# Patient Record
Sex: Female | Born: 1950 | Race: Black or African American | Hispanic: No | Marital: Married | Smoking: Never smoker
Health system: Southern US, Community
[De-identification: ages and names within clinical notes are randomized; demographics above are authoritative.]

## PROBLEM LIST (undated history)

## (undated) DIAGNOSIS — E785 Hyperlipidemia, unspecified: Secondary | ICD-10-CM

## (undated) DIAGNOSIS — M1990 Unspecified osteoarthritis, unspecified site: Secondary | ICD-10-CM

## (undated) DIAGNOSIS — I779 Disorder of arteries and arterioles, unspecified: Secondary | ICD-10-CM

## (undated) DIAGNOSIS — E669 Obesity, unspecified: Secondary | ICD-10-CM

## (undated) DIAGNOSIS — I10 Essential (primary) hypertension: Secondary | ICD-10-CM

## (undated) DIAGNOSIS — I4819 Other persistent atrial fibrillation: Secondary | ICD-10-CM

## (undated) DIAGNOSIS — I480 Paroxysmal atrial fibrillation: Secondary | ICD-10-CM

## (undated) DIAGNOSIS — I517 Cardiomegaly: Secondary | ICD-10-CM

## (undated) DIAGNOSIS — I4510 Unspecified right bundle-branch block: Secondary | ICD-10-CM

## (undated) DIAGNOSIS — I700 Atherosclerosis of aorta: Secondary | ICD-10-CM

## (undated) HISTORY — DX: Unspecified right bundle-branch block: I45.10

## (undated) HISTORY — PX: BREAST BIOPSY: SHX20

## (undated) HISTORY — DX: Other persistent atrial fibrillation: I48.19

## (undated) HISTORY — PX: ABDOMINAL HYSTERECTOMY: SHX81

## (undated) HISTORY — DX: Obesity, unspecified: E66.9

## (undated) HISTORY — DX: Essential (primary) hypertension: I10

## (undated) HISTORY — DX: Atherosclerosis of aorta: I70.0

## (undated) HISTORY — DX: Hyperlipidemia, unspecified: E78.5

## (undated) HISTORY — DX: Paroxysmal atrial fibrillation: I48.0

## (undated) HISTORY — DX: Disorder of arteries and arterioles, unspecified: I77.9

## (undated) HISTORY — PX: TOTAL ABDOMINAL HYSTERECTOMY W/ BILATERAL SALPINGOOPHORECTOMY: SHX83

---

## 2001-03-03 ENCOUNTER — Encounter: Payer: Self-pay | Admitting: Specialist

## 2001-03-03 ENCOUNTER — Ambulatory Visit (HOSPITAL_COMMUNITY): Admission: RE | Admit: 2001-03-03 | Discharge: 2001-03-03 | Payer: Self-pay | Admitting: Specialist

## 2001-11-11 ENCOUNTER — Emergency Department (HOSPITAL_COMMUNITY): Admission: EM | Admit: 2001-11-11 | Discharge: 2001-11-11 | Payer: Self-pay | Admitting: Emergency Medicine

## 2001-11-16 ENCOUNTER — Encounter (HOSPITAL_COMMUNITY): Admission: RE | Admit: 2001-11-16 | Discharge: 2001-12-16 | Payer: Self-pay | Admitting: Internal Medicine

## 2002-04-01 ENCOUNTER — Ambulatory Visit (HOSPITAL_COMMUNITY): Admission: RE | Admit: 2002-04-01 | Discharge: 2002-04-01 | Payer: Self-pay | Admitting: Specialist

## 2002-04-01 ENCOUNTER — Encounter: Payer: Self-pay | Admitting: Specialist

## 2002-11-09 ENCOUNTER — Encounter: Payer: Self-pay | Admitting: Emergency Medicine

## 2002-11-09 ENCOUNTER — Emergency Department (HOSPITAL_COMMUNITY): Admission: EM | Admit: 2002-11-09 | Discharge: 2002-11-09 | Payer: Self-pay | Admitting: Emergency Medicine

## 2003-04-25 ENCOUNTER — Emergency Department (HOSPITAL_COMMUNITY): Admission: EM | Admit: 2003-04-25 | Discharge: 2003-04-25 | Payer: Self-pay | Admitting: Internal Medicine

## 2003-05-10 ENCOUNTER — Emergency Department (HOSPITAL_COMMUNITY): Admission: EM | Admit: 2003-05-10 | Discharge: 2003-05-10 | Payer: Self-pay | Admitting: Emergency Medicine

## 2003-05-16 ENCOUNTER — Ambulatory Visit (HOSPITAL_COMMUNITY): Admission: RE | Admit: 2003-05-16 | Discharge: 2003-05-16 | Payer: Self-pay | Admitting: *Deleted

## 2003-05-23 ENCOUNTER — Ambulatory Visit (HOSPITAL_COMMUNITY): Admission: RE | Admit: 2003-05-23 | Discharge: 2003-05-23 | Payer: Self-pay | Admitting: *Deleted

## 2003-06-07 ENCOUNTER — Inpatient Hospital Stay (HOSPITAL_COMMUNITY): Admission: AD | Admit: 2003-06-07 | Discharge: 2003-06-15 | Payer: Self-pay | Admitting: Internal Medicine

## 2003-06-07 ENCOUNTER — Ambulatory Visit (HOSPITAL_COMMUNITY): Admission: RE | Admit: 2003-06-07 | Discharge: 2003-06-07 | Payer: Self-pay | Admitting: *Deleted

## 2003-09-26 ENCOUNTER — Ambulatory Visit (HOSPITAL_COMMUNITY): Admission: RE | Admit: 2003-09-26 | Discharge: 2003-09-26 | Payer: Self-pay | Admitting: Family Medicine

## 2003-10-11 ENCOUNTER — Observation Stay (HOSPITAL_COMMUNITY): Admission: EM | Admit: 2003-10-11 | Discharge: 2003-10-12 | Payer: Self-pay | Admitting: Emergency Medicine

## 2004-01-12 ENCOUNTER — Inpatient Hospital Stay (HOSPITAL_COMMUNITY): Admission: AC | Admit: 2004-01-12 | Discharge: 2004-01-18 | Payer: Self-pay

## 2004-03-09 ENCOUNTER — Encounter: Admission: RE | Admit: 2004-03-09 | Discharge: 2004-03-09 | Payer: Self-pay | Admitting: Orthopaedic Surgery

## 2004-04-14 ENCOUNTER — Emergency Department (HOSPITAL_COMMUNITY): Admission: EM | Admit: 2004-04-14 | Discharge: 2004-04-14 | Payer: Self-pay | Admitting: Emergency Medicine

## 2004-04-24 ENCOUNTER — Emergency Department (HOSPITAL_COMMUNITY): Admission: EM | Admit: 2004-04-24 | Discharge: 2004-04-24 | Payer: Self-pay | Admitting: Emergency Medicine

## 2004-04-25 ENCOUNTER — Observation Stay (HOSPITAL_COMMUNITY): Admission: EM | Admit: 2004-04-25 | Discharge: 2004-04-26 | Payer: Self-pay | Admitting: Emergency Medicine

## 2004-04-25 ENCOUNTER — Ambulatory Visit: Payer: Self-pay | Admitting: Family Medicine

## 2004-04-25 ENCOUNTER — Ambulatory Visit: Payer: Self-pay | Admitting: Cardiology

## 2004-05-14 ENCOUNTER — Ambulatory Visit: Payer: Self-pay | Admitting: Family Medicine

## 2004-06-12 ENCOUNTER — Ambulatory Visit: Payer: Self-pay | Admitting: Orthopedic Surgery

## 2004-06-14 ENCOUNTER — Ambulatory Visit: Payer: Self-pay | Admitting: Family Medicine

## 2004-06-16 HISTORY — PX: SHOULDER SURGERY: SHX246

## 2004-06-16 HISTORY — PX: COLONOSCOPY: SHX174

## 2004-06-18 ENCOUNTER — Encounter (HOSPITAL_COMMUNITY): Admission: RE | Admit: 2004-06-18 | Discharge: 2004-07-18 | Payer: Self-pay | Admitting: Orthopedic Surgery

## 2004-06-19 ENCOUNTER — Ambulatory Visit (HOSPITAL_COMMUNITY): Admission: RE | Admit: 2004-06-19 | Discharge: 2004-06-19 | Payer: Self-pay | Admitting: Orthopedic Surgery

## 2004-06-26 ENCOUNTER — Ambulatory Visit: Payer: Self-pay | Admitting: Orthopedic Surgery

## 2004-07-22 ENCOUNTER — Encounter (HOSPITAL_COMMUNITY): Admission: RE | Admit: 2004-07-22 | Discharge: 2004-08-21 | Payer: Self-pay | Admitting: Orthopedic Surgery

## 2004-07-29 ENCOUNTER — Ambulatory Visit: Payer: Self-pay | Admitting: Orthopedic Surgery

## 2004-08-06 ENCOUNTER — Ambulatory Visit (HOSPITAL_COMMUNITY): Admission: RE | Admit: 2004-08-06 | Discharge: 2004-08-06 | Payer: Self-pay | Admitting: Orthopedic Surgery

## 2004-08-06 ENCOUNTER — Ambulatory Visit: Payer: Self-pay | Admitting: Orthopedic Surgery

## 2004-08-08 ENCOUNTER — Ambulatory Visit: Payer: Self-pay | Admitting: Orthopedic Surgery

## 2004-08-12 ENCOUNTER — Encounter (HOSPITAL_COMMUNITY): Admission: RE | Admit: 2004-08-12 | Discharge: 2004-09-11 | Payer: Self-pay | Admitting: Orthopedic Surgery

## 2004-08-15 ENCOUNTER — Ambulatory Visit: Payer: Self-pay | Admitting: Orthopedic Surgery

## 2004-09-12 ENCOUNTER — Ambulatory Visit: Payer: Self-pay | Admitting: Orthopedic Surgery

## 2004-09-17 ENCOUNTER — Encounter (HOSPITAL_COMMUNITY): Admission: RE | Admit: 2004-09-17 | Discharge: 2004-10-17 | Payer: Self-pay | Admitting: Orthopedic Surgery

## 2004-10-11 ENCOUNTER — Ambulatory Visit: Payer: Self-pay | Admitting: Family Medicine

## 2004-10-14 ENCOUNTER — Ambulatory Visit (HOSPITAL_COMMUNITY): Admission: RE | Admit: 2004-10-14 | Discharge: 2004-10-14 | Payer: Self-pay | Admitting: Family Medicine

## 2004-10-31 ENCOUNTER — Ambulatory Visit: Payer: Self-pay | Admitting: Orthopedic Surgery

## 2004-12-02 ENCOUNTER — Ambulatory Visit: Payer: Self-pay | Admitting: Orthopedic Surgery

## 2005-02-18 ENCOUNTER — Ambulatory Visit: Payer: Self-pay | Admitting: Family Medicine

## 2005-03-06 ENCOUNTER — Ambulatory Visit (HOSPITAL_COMMUNITY): Admission: RE | Admit: 2005-03-06 | Discharge: 2005-03-06 | Payer: Self-pay | Admitting: General Surgery

## 2005-03-06 LAB — HM COLONOSCOPY: HM Colonoscopy: NORMAL

## 2005-04-22 ENCOUNTER — Ambulatory Visit: Payer: Self-pay | Admitting: Family Medicine

## 2005-05-22 ENCOUNTER — Ambulatory Visit: Payer: Self-pay | Admitting: Family Medicine

## 2005-07-02 ENCOUNTER — Emergency Department (HOSPITAL_COMMUNITY): Admission: EM | Admit: 2005-07-02 | Discharge: 2005-07-02 | Payer: Self-pay | Admitting: Emergency Medicine

## 2005-07-03 ENCOUNTER — Ambulatory Visit: Payer: Self-pay | Admitting: Family Medicine

## 2005-09-18 ENCOUNTER — Ambulatory Visit: Payer: Self-pay | Admitting: Family Medicine

## 2005-10-15 ENCOUNTER — Ambulatory Visit (HOSPITAL_COMMUNITY): Admission: RE | Admit: 2005-10-15 | Discharge: 2005-10-15 | Payer: Self-pay | Admitting: Family Medicine

## 2006-01-28 ENCOUNTER — Ambulatory Visit: Payer: Self-pay | Admitting: Family Medicine

## 2006-04-30 ENCOUNTER — Other Ambulatory Visit: Admission: RE | Admit: 2006-04-30 | Discharge: 2006-04-30 | Payer: Self-pay | Admitting: Family Medicine

## 2006-04-30 ENCOUNTER — Ambulatory Visit: Payer: Self-pay | Admitting: Family Medicine

## 2006-06-30 ENCOUNTER — Ambulatory Visit (HOSPITAL_COMMUNITY): Admission: RE | Admit: 2006-06-30 | Discharge: 2006-06-30 | Payer: Self-pay | Admitting: Ophthalmology

## 2006-09-14 ENCOUNTER — Encounter: Payer: Self-pay | Admitting: Family Medicine

## 2006-09-14 LAB — CONVERTED CEMR LAB
Alkaline Phosphatase: 123 units/L — ABNORMAL HIGH (ref 39–117)
Basophils Relative: 1 % (ref 0–1)
Bilirubin, Direct: 0.1 mg/dL (ref 0.0–0.3)
Eosinophils Relative: 2 % (ref 0–5)
HDL: 40 mg/dL (ref >39)
Indirect Bilirubin: 0.3 mg/dL (ref 0.0–0.9)
Lymphocytes Relative: 40 % (ref 12–46)
MCV: 86.1 fL (ref 78.0–100.0)
Platelets: 238 10*3/uL (ref 150–400)
RBC: 4.45 M/uL (ref 3.87–5.11)
RDW: 14.7 % — ABNORMAL HIGH (ref 11.5–14.0)
Total Bilirubin: 0.4 mg/dL (ref 0.3–1.2)
Total CHOL/HDL Ratio: 3.5
Total Protein: 7.7 g/dL (ref 6.0–8.3)
Triglycerides: 60 mg/dL (ref <150)
VLDL: 12 mg/dL (ref 0–40)
WBC: 4.3 10*3/uL (ref 4.0–10.5)

## 2006-09-17 ENCOUNTER — Ambulatory Visit: Payer: Self-pay | Admitting: Family Medicine

## 2006-09-18 ENCOUNTER — Encounter: Payer: Self-pay | Admitting: Family Medicine

## 2006-09-18 LAB — CONVERTED CEMR LAB
Calcium: 9.3 mg/dL (ref 8.4–10.5)
Glucose, Bld: 90 mg/dL (ref 70–99)
Sodium: 140 meq/L (ref 135–145)

## 2006-09-25 ENCOUNTER — Ambulatory Visit: Payer: Self-pay | Admitting: Cardiology

## 2006-10-07 ENCOUNTER — Ambulatory Visit: Payer: Self-pay | Admitting: Family Medicine

## 2006-10-13 ENCOUNTER — Ambulatory Visit (HOSPITAL_COMMUNITY): Admission: RE | Admit: 2006-10-13 | Discharge: 2006-10-13 | Payer: Self-pay | Admitting: Family Medicine

## 2006-10-28 ENCOUNTER — Ambulatory Visit (HOSPITAL_COMMUNITY): Admission: RE | Admit: 2006-10-28 | Discharge: 2006-10-28 | Payer: Self-pay | Admitting: Cardiology

## 2006-10-28 ENCOUNTER — Ambulatory Visit: Payer: Self-pay | Admitting: Cardiology

## 2006-11-24 ENCOUNTER — Ambulatory Visit: Payer: Self-pay | Admitting: Cardiology

## 2007-01-05 ENCOUNTER — Ambulatory Visit: Payer: Self-pay | Admitting: Family Medicine

## 2007-01-07 ENCOUNTER — Encounter: Payer: Self-pay | Admitting: Family Medicine

## 2007-01-07 LAB — CONVERTED CEMR LAB
ALT: 25 units/L (ref 0–35)
AST: 18 units/L (ref 0–37)
BUN: 17 mg/dL (ref 6–23)
CO2: 26 meq/L (ref 19–32)
Cholesterol: 150 mg/dL (ref 0–200)
Creatinine, Ser: 0.6 mg/dL (ref 0.40–1.20)
Eosinophils Relative: 3 % (ref 0–5)
Glucose, Bld: 105 mg/dL — ABNORMAL HIGH (ref 70–99)
HCT: 37.8 % (ref 36.0–46.0)
HDL: 45 mg/dL (ref >39)
Hemoglobin: 12.3 g/dL (ref 12.0–15.0)
MCHC: 32.5 g/dL (ref 30.0–36.0)
MCV: 87.7 fL (ref 78.0–100.0)
Monocytes Relative: 7 % (ref 3–11)
Neutro Abs: 2.5 10*3/uL (ref 1.7–7.7)
Neutrophils Relative %: 49 % (ref 43–77)
Platelets: 222 10*3/uL (ref 150–400)
RBC: 4.31 M/uL (ref 3.87–5.11)
Total Bilirubin: 0.3 mg/dL (ref 0.3–1.2)
Total CHOL/HDL Ratio: 3.3
Triglycerides: 59 mg/dL (ref <150)
VLDL: 12 mg/dL (ref 0–40)

## 2007-01-11 ENCOUNTER — Ambulatory Visit (HOSPITAL_COMMUNITY): Admission: RE | Admit: 2007-01-11 | Discharge: 2007-01-11 | Payer: Self-pay | Admitting: Family Medicine

## 2007-03-23 ENCOUNTER — Ambulatory Visit: Payer: Self-pay | Admitting: Family Medicine

## 2007-05-03 ENCOUNTER — Other Ambulatory Visit: Admission: RE | Admit: 2007-05-03 | Discharge: 2007-05-03 | Payer: Self-pay | Admitting: Family Medicine

## 2007-05-03 ENCOUNTER — Ambulatory Visit: Payer: Self-pay | Admitting: Family Medicine

## 2007-05-03 LAB — CONVERTED CEMR LAB
ALT: 21 units/L (ref 0–35)
Albumin: 4.1 g/dL (ref 3.5–5.2)
Basophils Absolute: 0 10*3/uL (ref 0.0–0.1)
Bilirubin, Direct: 0.1 mg/dL (ref 0.0–0.3)
CO2: 24 meq/L (ref 19–32)
Chloride: 104 meq/L (ref 96–112)
Creatinine, Ser: 0.63 mg/dL (ref 0.40–1.20)
Eosinophils Absolute: 0.1 10*3/uL — ABNORMAL LOW (ref 0.2–0.7)
Eosinophils Relative: 3 % (ref 0–5)
HDL: 41 mg/dL (ref >39)
Hemoglobin: 13.1 g/dL (ref 12.0–15.0)
Indirect Bilirubin: 0.2 mg/dL (ref 0.0–0.9)
LDL Cholesterol: 89 mg/dL (ref 0–99)
Lymphocytes Relative: 34 % (ref 12–46)
Lymphs Abs: 1.8 10*3/uL (ref 0.7–4.0)
MCV: 88.4 fL (ref 78.0–100.0)
Monocytes Absolute: 0.4 10*3/uL (ref 0.1–1.0)
Monocytes Relative: 7 % (ref 3–12)
Neutrophils Relative %: 56 % (ref 43–77)
Platelets: 221 10*3/uL (ref 150–400)
Potassium: 4 meq/L (ref 3.5–5.3)
Sodium: 139 meq/L (ref 135–145)
Total Bilirubin: 0.3 mg/dL (ref 0.3–1.2)
VLDL: 14 mg/dL (ref 0–40)

## 2007-06-17 ENCOUNTER — Encounter: Payer: Self-pay | Admitting: Family Medicine

## 2007-09-08 ENCOUNTER — Ambulatory Visit: Payer: Self-pay | Admitting: Family Medicine

## 2007-09-08 ENCOUNTER — Ambulatory Visit (HOSPITAL_COMMUNITY): Admission: RE | Admit: 2007-09-08 | Discharge: 2007-09-08 | Payer: Self-pay | Admitting: Family Medicine

## 2007-09-08 LAB — CONVERTED CEMR LAB
ALT: 21 units/L (ref 0–35)
AST: 18 units/L (ref 0–37)
Alkaline Phosphatase: 117 units/L (ref 39–117)
CO2: 25 meq/L (ref 19–32)
Chloride: 105 meq/L (ref 96–112)
Cholesterol: 141 mg/dL (ref 0–200)
Eosinophils Absolute: 0.2 10*3/uL (ref 0.0–0.7)
HDL: 43 mg/dL (ref >39)
Hemoglobin: 12.9 g/dL (ref 12.0–15.0)
Indirect Bilirubin: 0.2 mg/dL (ref 0.0–0.9)
LDL Cholesterol: 86 mg/dL (ref 0–99)
Lymphocytes Relative: 31 % (ref 12–46)
Lymphs Abs: 1.7 10*3/uL (ref 0.7–4.0)
MCHC: 34 g/dL (ref 30.0–36.0)
MCV: 86.1 fL (ref 78.0–100.0)
Monocytes Absolute: 0.3 10*3/uL (ref 0.1–1.0)
Monocytes Relative: 6 % (ref 3–12)
Neutrophils Relative %: 59 % (ref 43–77)
Potassium: 4.7 meq/L (ref 3.5–5.3)
Total Bilirubin: 0.3 mg/dL (ref 0.3–1.2)
Total CHOL/HDL Ratio: 3.3
Triglycerides: 62 mg/dL (ref <150)
Troponin I: 0.04 ng/mL (ref <0.06)

## 2007-12-06 ENCOUNTER — Encounter: Payer: Self-pay | Admitting: Family Medicine

## 2007-12-06 DIAGNOSIS — E785 Hyperlipidemia, unspecified: Secondary | ICD-10-CM | POA: Insufficient documentation

## 2008-02-09 ENCOUNTER — Encounter: Payer: Self-pay | Admitting: Family Medicine

## 2008-12-08 ENCOUNTER — Ambulatory Visit: Payer: Self-pay | Admitting: Family Medicine

## 2008-12-08 DIAGNOSIS — I10 Essential (primary) hypertension: Secondary | ICD-10-CM | POA: Insufficient documentation

## 2008-12-08 DIAGNOSIS — H547 Unspecified visual loss: Secondary | ICD-10-CM

## 2008-12-08 HISTORY — DX: Unspecified visual loss: H54.7

## 2008-12-08 LAB — CONVERTED CEMR LAB
Albumin: 4.6 g/dL (ref 3.5–5.2)
BUN: 18 mg/dL (ref 6–23)
Basophils Absolute: 0 10*3/uL (ref 0.0–0.1)
Basophils Relative: 1 % (ref 0–1)
CO2: 26 meq/L (ref 19–32)
Calcium: 9.4 mg/dL (ref 8.4–10.5)
Creatinine, Ser: 0.61 mg/dL (ref 0.40–1.20)
Glucose, Bld: 106 mg/dL — ABNORMAL HIGH (ref 70–99)
HDL: 45 mg/dL (ref >39)
LDL Cholesterol: 92 mg/dL (ref 0–99)
Lymphocytes Relative: 37 % (ref 12–46)
Lymphs Abs: 2 10*3/uL (ref 0.7–4.0)
MCHC: 32.1 g/dL (ref 30.0–36.0)
Monocytes Absolute: 0.4 10*3/uL (ref 0.1–1.0)
Neutro Abs: 2.8 10*3/uL (ref 1.7–7.7)
Neutrophils Relative %: 51 % (ref 43–77)
RDW: 15.7 % — ABNORMAL HIGH (ref 11.5–15.5)
Sodium: 140 meq/L (ref 135–145)
Total Bilirubin: 0.4 mg/dL (ref 0.3–1.2)
Total Protein: 7.8 g/dL (ref 6.0–8.3)
Triglycerides: 77 mg/dL (ref <150)

## 2009-01-03 ENCOUNTER — Ambulatory Visit: Payer: Self-pay | Admitting: Family Medicine

## 2009-01-03 DIAGNOSIS — M25569 Pain in unspecified knee: Secondary | ICD-10-CM | POA: Insufficient documentation

## 2009-02-07 ENCOUNTER — Telehealth: Payer: Self-pay | Admitting: Family Medicine

## 2009-02-09 ENCOUNTER — Ambulatory Visit: Payer: Self-pay | Admitting: Family Medicine

## 2009-02-09 ENCOUNTER — Encounter: Payer: Self-pay | Admitting: Family Medicine

## 2009-02-09 ENCOUNTER — Other Ambulatory Visit: Admission: RE | Admit: 2009-02-09 | Discharge: 2009-02-09 | Payer: Self-pay | Admitting: Family Medicine

## 2009-02-16 ENCOUNTER — Ambulatory Visit (HOSPITAL_COMMUNITY): Admission: RE | Admit: 2009-02-16 | Discharge: 2009-02-16 | Payer: Self-pay | Admitting: Family Medicine

## 2009-03-23 ENCOUNTER — Encounter: Payer: Self-pay | Admitting: Family Medicine

## 2009-03-26 LAB — CONVERTED CEMR LAB
BUN: 16 mg/dL (ref 6–23)
Bilirubin, Direct: 0.1 mg/dL (ref 0.0–0.3)
Calcium: 9.5 mg/dL (ref 8.4–10.5)
Cholesterol: 147 mg/dL (ref 0–200)
Glucose, Bld: 110 mg/dL — ABNORMAL HIGH (ref 70–99)
HDL: 38 mg/dL — ABNORMAL LOW (ref >39)
LDL Cholesterol: 89 mg/dL (ref 0–99)
Sodium: 141 meq/L (ref 135–145)
Triglycerides: 101 mg/dL (ref <150)

## 2009-05-18 ENCOUNTER — Ambulatory Visit: Payer: Self-pay | Admitting: Family Medicine

## 2009-05-22 ENCOUNTER — Telehealth: Payer: Self-pay | Admitting: Family Medicine

## 2009-06-22 LAB — CONVERTED CEMR LAB
AST: 17 units/L (ref 0–37)
Albumin: 4 g/dL (ref 3.5–5.2)
BUN: 17 mg/dL (ref 6–23)
CO2: 27 meq/L (ref 19–32)
Cholesterol: 167 mg/dL (ref 0–200)
Glucose, Bld: 92 mg/dL (ref 70–99)
HDL: 42 mg/dL (ref >39)
LDL Cholesterol: 103 mg/dL — ABNORMAL HIGH (ref 0–99)
Total Bilirubin: 0.3 mg/dL (ref 0.3–1.2)
Total CHOL/HDL Ratio: 4
Total Protein: 7.4 g/dL (ref 6.0–8.3)
Triglycerides: 110 mg/dL (ref <150)
VLDL: 22 mg/dL (ref 0–40)

## 2009-08-31 ENCOUNTER — Ambulatory Visit: Payer: Self-pay | Admitting: Family Medicine

## 2009-10-25 ENCOUNTER — Encounter: Payer: Self-pay | Admitting: Physician Assistant

## 2009-10-25 ENCOUNTER — Telehealth: Payer: Self-pay | Admitting: Family Medicine

## 2009-10-25 LAB — CONVERTED CEMR LAB
Albumin: 4.1 g/dL (ref 3.5–5.2)
BUN: 22 mg/dL (ref 6–23)
Calcium: 8.9 mg/dL (ref 8.4–10.5)
Chloride: 100 meq/L (ref 96–112)
Creatinine, Ser: 0.78 mg/dL (ref 0.40–1.20)
HDL: 39 mg/dL — ABNORMAL LOW (ref >39)
Hemoglobin: 12.3 g/dL (ref 12.0–15.0)
Potassium: 4.1 meq/L (ref 3.5–5.3)
RBC: 4.33 M/uL (ref 3.87–5.11)
Sodium: 136 meq/L (ref 135–145)
Total CHOL/HDL Ratio: 4.1
Total Protein: 7.2 g/dL (ref 6.0–8.3)
Triglycerides: 113 mg/dL (ref <150)
WBC: 5.8 10*3/uL (ref 4.0–10.5)

## 2009-10-31 ENCOUNTER — Ambulatory Visit: Payer: Self-pay | Admitting: Family Medicine

## 2009-11-16 ENCOUNTER — Telehealth: Payer: Self-pay | Admitting: Family Medicine

## 2009-11-30 LAB — CONVERTED CEMR LAB
ALT: 16 units/L (ref 0–35)
AST: 16 units/L (ref 0–37)
Albumin: 4.1 g/dL (ref 3.5–5.2)
BUN: 14 mg/dL (ref 6–23)
Calcium: 9.4 mg/dL (ref 8.4–10.5)
Chloride: 103 meq/L (ref 96–112)
Cholesterol: 175 mg/dL (ref 0–200)
LDL Cholesterol: 116 mg/dL — ABNORMAL HIGH (ref 0–99)
Microalb Creat Ratio: 5 mg/g (ref 0.0–30.0)
Microalb, Ur: 1.08 mg/dL (ref 0.00–1.89)
Potassium: 3.9 meq/L (ref 3.5–5.3)
Total Protein: 7.4 g/dL (ref 6.0–8.3)
Triglycerides: 106 mg/dL (ref <150)
VLDL: 21 mg/dL (ref 0–40)

## 2009-12-03 ENCOUNTER — Telehealth: Payer: Self-pay | Admitting: Family Medicine

## 2010-01-31 ENCOUNTER — Telehealth: Payer: Self-pay | Admitting: Family Medicine

## 2010-01-31 ENCOUNTER — Ambulatory Visit: Payer: Self-pay | Admitting: Family Medicine

## 2010-01-31 DIAGNOSIS — H109 Unspecified conjunctivitis: Secondary | ICD-10-CM | POA: Insufficient documentation

## 2010-02-08 ENCOUNTER — Encounter: Payer: Self-pay | Admitting: Family Medicine

## 2010-03-16 ENCOUNTER — Emergency Department (HOSPITAL_COMMUNITY): Admission: EM | Admit: 2010-03-16 | Discharge: 2010-03-16 | Payer: Self-pay | Admitting: Emergency Medicine

## 2010-03-20 ENCOUNTER — Telehealth: Payer: Self-pay | Admitting: Family Medicine

## 2010-03-20 ENCOUNTER — Encounter: Payer: Self-pay | Admitting: Physician Assistant

## 2010-03-20 ENCOUNTER — Ambulatory Visit: Payer: Self-pay | Admitting: Family Medicine

## 2010-03-20 DIAGNOSIS — R42 Dizziness and giddiness: Secondary | ICD-10-CM | POA: Insufficient documentation

## 2010-03-20 DIAGNOSIS — R51 Headache: Secondary | ICD-10-CM | POA: Insufficient documentation

## 2010-03-20 DIAGNOSIS — R519 Headache, unspecified: Secondary | ICD-10-CM | POA: Insufficient documentation

## 2010-03-22 ENCOUNTER — Ambulatory Visit (HOSPITAL_COMMUNITY)
Admission: RE | Admit: 2010-03-22 | Discharge: 2010-03-22 | Payer: Self-pay | Source: Home / Self Care | Admitting: Family Medicine

## 2010-03-25 ENCOUNTER — Telehealth: Payer: Self-pay | Admitting: Family Medicine

## 2010-03-26 ENCOUNTER — Encounter (INDEPENDENT_AMBULATORY_CARE_PROVIDER_SITE_OTHER): Payer: Self-pay

## 2010-03-28 ENCOUNTER — Ambulatory Visit: Payer: Self-pay | Admitting: Otolaryngology

## 2010-04-01 ENCOUNTER — Encounter: Payer: Self-pay | Admitting: Family Medicine

## 2010-04-05 LAB — CONVERTED CEMR LAB
ALT: 17 units/L (ref 0–35)
AST: 18 units/L (ref 0–37)
Albumin: 4.3 g/dL (ref 3.5–5.2)
BUN: 14 mg/dL (ref 6–23)
CO2: 27 meq/L (ref 19–32)
Calcium: 9.5 mg/dL (ref 8.4–10.5)
Chloride: 101 meq/L (ref 96–112)
Hgb A1c MFr Bld: 6.1 % — ABNORMAL HIGH (ref <5.7)
Potassium: 4.2 meq/L (ref 3.5–5.3)
Total Protein: 7.5 g/dL (ref 6.0–8.3)
Triglycerides: 112 mg/dL (ref <150)

## 2010-04-16 ENCOUNTER — Ambulatory Visit: Payer: Self-pay | Admitting: Family Medicine

## 2010-04-23 ENCOUNTER — Ambulatory Visit (HOSPITAL_COMMUNITY): Admission: RE | Admit: 2010-04-23 | Discharge: 2010-04-23 | Payer: Self-pay | Admitting: Family Medicine

## 2010-07-06 ENCOUNTER — Encounter: Payer: Self-pay | Admitting: Family Medicine

## 2010-07-07 ENCOUNTER — Encounter: Payer: Self-pay | Admitting: Family Medicine

## 2010-07-08 ENCOUNTER — Encounter: Payer: Self-pay | Admitting: Family Medicine

## 2010-07-13 LAB — CONVERTED CEMR LAB
ALT: 13 units/L (ref 0–35)
AST: 16 units/L (ref 0–37)
Cholesterol: 169 mg/dL (ref 0–200)
HDL: 42 mg/dL (ref >39)
LDL Cholesterol: 109 mg/dL — ABNORMAL HIGH (ref 0–99)
Potassium: 3.8 meq/L (ref 3.5–5.3)
Total Bilirubin: 0.3 mg/dL (ref 0.3–1.2)
Total Protein: 7.2 g/dL (ref 6.0–8.3)
Triglycerides: 89 mg/dL (ref <150)
VLDL: 18 mg/dL (ref 0–40)

## 2010-07-16 NOTE — Letter (Signed)
Summary: office notes  office notes   Imported By: Lind Guest 11/05/2009 13:57:47  _____________________________________________________________________  External Attachment:    Type:   Image     Comment:   External Document

## 2010-07-16 NOTE — Progress Notes (Signed)
Summary: RESULTS  Phone Note Call from Patient   Summary of Call: WANTST TO KNOW DID TEST COME BACK  CALL AT [phone] Initial call taken by: Lind Guest,  March 25, 2010 10:13 AM  Follow-up for Phone Call        called and husband will get her to call me back  Follow-up by: Everitt Amber LPN,  March 25, 2010 10:17 AM  Additional Follow-up for Phone Call Additional follow up Details #1::        I advised her the MRI was normal (signed off without comment)  Additional Follow-up by: Everitt Amber LPN,  March 25, 2010 10:21 AM

## 2010-07-16 NOTE — Progress Notes (Signed)
Summary: note  Phone Note Call from Patient   Summary of Call: would like to get a note to be out of work until 13th. she see's dr. Suszanne Conners on the 13th. [phone] Initial call taken by: Rudene Anda,  March 25, 2010 11:07 AM  Follow-up for Phone Call        states head is till swimmy feeling and she is supposed to go back to work the 12th but wants to wait until after her visit with Teoh (13th) Follow-up by: Everitt Amber LPN,  March 25, 2010 11:09 AM  Additional Follow-up for Phone Call Additional follow up Details #1::        pls send in new nioote for the 14th after she sees ent, i will ign Additional Follow-up by: Syliva Overman MD,  March 25, 2010 12:09 PM    Additional Follow-up for Phone Call Additional follow up Details #2::    letter on bookshelf to be signed Patient aware Follow-up by: Everitt Amber LPN,  March 26, 2010 4:42 PM
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
## 2010-07-16 NOTE — Letter (Signed)
Summary: Out of Work  [facility]  [address]   [place], [place] [address]   Phone: [phone]  Fax: [phone]    March 26, 2010   Employee:  Samantha Vega    To Whom It May Concern:   For Medical reasons, please excuse the above named employee from work for the following dates:  Start:   03/20/2010  End:   03/29/2010 To return without restrictions  If you need additional information, please feel free to contact our office.         Sincerely,    Milus Mallick. Lodema Hong, M.D.

## 2010-07-16 NOTE — Progress Notes (Signed)
Summary: strips  Phone Note Call from Patient   Summary of Call: [facility] [place]  call back at [phone] to let her know when i is done Initial call taken by: Lind Guest,  November 16, 2009 10:26 AM    New/Updated Medications: ACCU-CHEK AVIVA  STRP (GLUCOSE BLOOD) once daily testing Prescriptions: ACCU-CHEK AVIVA  STRP (GLUCOSE BLOOD) once daily testing  #50 x 5   Entered by:   Everitt Amber LPN   Authorized by:   Syliva Overman MD   Signed by:   Everitt Amber LPN on 14/78/2956   Method used:   [facility]ronically to        [facility] [phone] (retail)       [address]       [place], [place]  [address]       Ph: [phone] or [phone]       Fax: [phone]   RxID:   [id number]

## 2010-07-16 NOTE — Letter (Signed)
Summary: phone notes  phone notes   Imported By: Lind Guest 11/05/2009 13:58:23  _____________________________________________________________________  External Attachment:    Type:   Image     Comment:   External Document

## 2010-07-16 NOTE — Progress Notes (Signed)
Summary: results of labs  Phone Note Call from Patient   Summary of Call: had lab work done and would like results [phone] Initial call taken by: Rudene Anda,  Oct 25, 2009 4:31 PM  Follow-up for Phone Call        called patient, left message Follow-up by: Adella Hare LPN,  Oct 26, 2009 2:06 PM  Additional Follow-up for Phone Call Additional follow up Details #1::        advised patient that her sugar is high, advised will let her know more once dr Nazaret Chea had time to review her labs Additional Follow-up by: Adella Hare LPN,  Oct 26, 2009 2:16 PM

## 2010-07-16 NOTE — Letter (Signed)
Summary: labs  labs   Imported By: Lind Guest 11/05/2009 14:03:51  _____________________________________________________________________  External Attachment:    Type:   Image     Comment:   External Document

## 2010-07-16 NOTE — Letter (Signed)
Summary: CONSULTS  CONSULTS   Imported By: Lind Guest 11/05/2009 13:35:20  _____________________________________________________________________  External Attachment:    Type:   Image     Comment:   External Document

## 2010-07-16 NOTE — Progress Notes (Signed)
Summary: refer for MRI  Phone Note Call from Patient   Summary of Call: on radiology referral to get MRI. I mean't to put 03/22/2010 1:00. left message with pts son.  Initial call taken by: Rudene Anda,  March 20, 2010 3:21 PM

## 2010-07-16 NOTE — Assessment & Plan Note (Signed)
Summary: FOLLOW UP- room 2   Vital Signs:  Patient profile:   60 year old female Menstrual status:  hysterectomy Height:      62 inches Weight:      206.25 pounds BMI:     37.86 O2 Sat:      96 % on Room air Pulse rate:   66 / minute Resp:     16 per minute BP sitting:   110 / 80  (left arm)  Vitals Entered By: Adella Hare LPN (August 31, 2009 8:26 AM)  Nutrition Counseling: Patient's BMI is greater than 25 and therefore counseled on weight management options. CC: follow-up visit Is Patient Diabetic? No Pain Assessment Patient in pain? no        Primary Provider:  Syliva Overman MD  CC:  follow-up visit.  History of Present Illness: Pt is here today for a check up.  She is not having any problems or concerns.  She is taking her medications regularly & overall is feeling well.  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  She is not exercising.   Her last labs were done in Jan 2011 & were reviewed today. She is not due for her physical until Aug 2011.    Current Medications (verified): 1)  Oscal 500/200 D-3 500-200 Mg-Unit  Tabs (Calcium-Vitamin D) .... One Tab By Mouth Three Times A Day 2)  Multivitamins   Tabs (Multiple Vitamin) .... One Tab By Mouth Once Daily 3)  Anacin 81 Mg  Tbec (Aspirin) .... One Tab By Mouth Once Daily 4)  Toprol Xl 25 Mg  Tb24 (Metoprolol Succinate) .... Oen Tab By Mouth Two Times A Day 5)  Maxzide-25 37.5-25 Mg Tabs (Triamterene-Hctz) .... Take 1 Tablet By Mouth Once A Day 6)  Lovastatin 40 Mg Tabs (Lovastatin) .... Take 1 Tab By Mouth At Bedtime 7)  Terbinafine Hcl 250 Mg Tabs (Terbinafine Hcl) .... Take 1 Tablet By Mouth Once A Day  Allergies (verified): No Known Drug Allergies  Past History:  Past medical history reviewed for relevance to current acute and chronic problems.  Past Medical History: RBBB (ICD-426.4) OBESITY (ICD-278.00) HYPERLIPIDEMIA (ICD-272.4)   Hypertension  Review of  Systems General:  Denies chills and fever. ENT:  Denies earache, nasal congestion, and sore throat. CV:  Denies chest pain or discomfort and palpitations. Resp:  Denies cough and shortness of breath. GI:  Denies abdominal pain, change in bowel habits, and indigestion.  Physical Exam  General:  Well-developed,well-nourished,in no acute distress; alert,appropriate and cooperative throughout examination Head:  Normocephalic and atraumatic without obvious abnormalities. No apparent alopecia or balding. Ears:  External ear exam shows no significant lesions or deformities.  Otoscopic examination reveals clear canals, tympanic membranes are intact bilaterally without bulging, retraction, inflammation or discharge. Hearing is grossly normal bilaterally. Nose:  External nasal examination shows no deformity or inflammation. Nasal mucosa are pink and moist without lesions or exudates. Mouth:  Oral mucosa and oropharynx without lesions or exudates.   Neck:  No deformities, masses, or tenderness noted. Lungs:  Normal respiratory effort, chest expands symmetrically. Lungs are clear to auscultation, no crackles or wheezes. Heart:  Normal rate and regular rhythm. S1 and S2 normal without gallop, murmur, click, rub or other extra sounds. Cervical Nodes:  No lymphadenopathy noted Psych:  Cognition and judgment appear intact. Alert and cooperative with normal attention span and concentration. No apparent delusions, illusions, hallucinations   Impression & Recommendations:  Problem # 1:  HYPERTENSION (  ICD-401.9) Assessment Unchanged  Her updated medication list for this problem includes:    Toprol Xl 25 Mg Tb24 (Metoprolol succinate) ..... Oen tab by mouth two times a day    Maxzide-25 37.5-25 Mg Tabs (Triamterene-hctz) .Marland Kitchen... Take 1 tablet by mouth once a day  BP today: 110/80 Prior BP: 120/74 (05/18/2009)  Labs Reviewed: K+: 5.1 (06/22/2009) Creat: : 0.77 (06/22/2009)   Chol: 167 (06/22/2009)   HDL:  42 (06/22/2009)   LDL: 103 (06/22/2009)   TG: 110 (06/22/2009)  Orders: T-Comprehensive Metabolic Panel ([id number])  Problem # 2:  HYPERLIPIDEMIA (ICD-272.4) Assessment: Deteriorated  Her updated medication list for this problem includes:    Lovastatin 40 Mg Tabs (Lovastatin) .Marland Kitchen... Take 1 tab by mouth at bedtime  Orders: T-Comprehensive Metabolic Panel [id number]) T-Lipid Profile [id number])  Labs Reviewed: SGOT: 17 (06/22/2009)   SGPT: 16 (06/22/2009)   HDL:42 (06/22/2009), 38 (03/23/2009)  LDL:103 (06/22/2009), 89 (57/84/6962)  Chol:167 (06/22/2009), 147 (03/23/2009)  Trig:110 (06/22/2009), 101 (03/23/2009)  Problem # 3:  OBESITY (ICD-278.00) Assessment: Unchanged  Orders: T-TSH ([id number])  Ht: 62 (08/31/2009)   Wt: 206.25 (08/31/2009)   BMI: 37.86 (08/31/2009)  Complete Medication List: 1)  Oscal 500/200 D-3 500-200 Mg-unit Tabs (Calcium-vitamin d) .... One tab by mouth three times a day 2)  Multivitamins Tabs (Multiple vitamin) .... One tab by mouth once daily 3)  Anacin 81 Mg Tbec (Aspirin) .... One tab by mouth once daily 4)  Toprol Xl 25 Mg Tb24 (Metoprolol succinate) .... Oen tab by mouth two times a day 5)  Maxzide-25 37.5-25 Mg Tabs (Triamterene-hctz) .... Take 1 tablet by mouth once a day 6)  Lovastatin 40 Mg Tabs (Lovastatin) .... Take 1 tab by mouth at bedtime 7)  Terbinafine Hcl 250 Mg Tabs (Terbinafine hcl) .... Take 1 tablet by mouth once a day  Other Orders: T-CBC No Diff ([id number]) T-Vitamin D (25-Hydroxy) ([id number])  Patient Instructions: 1)  Please schedule a follow-up appointment in 3 months. 2)  It is important that you exercise regularly at least 20 minutes 5 times a week. If you develop chest pain, have severe difficulty breathing, or feel very tired , stop exercising immediately and seek medical attention. 3)  You need to lose weight. Consider a lower calorie diet and regular exercise.  4)  I ordered blood work for you to have done  fasting before your next appt.

## 2010-07-16 NOTE — Letter (Signed)
Summary: Out of Work  [facility]  [address]   [place], [place] [address]   Phone: [phone]  Fax: [phone]    March 20, 2010   Employee:  ALISSON ROZELL    To Whom It May Concern:   For Medical reasons, please excuse the above named employee from work for the following dates:  Start:   03/20/10    End:   03/27/10 to return with no restrictions If you need additional information, please feel free to contact our office.         Sincerely,    Syliva Overman, MD

## 2010-07-16 NOTE — Progress Notes (Signed)
Summary: blood sugars  Phone Note Call from Patient   Summary of Call: call her about her sugars at [phone] Initial call taken by: Lind Guest,  December 03, 2009 1:54 PM  Follow-up for Phone Call        returned call, left message Follow-up by: Adella Hare LPN,  December 03, 2009 2:51 PM  Additional Follow-up for Phone Call Additional follow up Details #1::        patient wanted to know if she was still supposed to test daily, advised yes Additional Follow-up by: Adella Hare LPN,  December 03, 2009 2:57 PM

## 2010-07-16 NOTE — Medication Information (Signed)
Summary: Tax adviser   Imported By: Lind Guest 02/08/2010 15:02:56  _____________________________________________________________________  External Attachment:    Type:   Image     Comment:   External Document

## 2010-07-16 NOTE — Assessment & Plan Note (Signed)
Summary: new diabetic- room 1   Vital Signs:  Patient profile:   60 year old female Menstrual status:  hysterectomy Height:      62 inches Weight:      207.25 pounds BMI:     38.04 O2 Sat:      99 % on Room air Pulse rate:   67 / minute Resp:     16 per minute BP sitting:   120 / 80  (left arm)  Vitals Entered By: Adella Hare LPN (Oct 31, 2009 8:55 AM)  Nutrition Counseling: Patient's BMI is greater than 25 and therefore counseled on weight management options. CC: lab results- new diabetic Is Patient Diabetic? Yes Did you bring your meter with you today? No Pain Assessment Patient in pain? no        Primary Provider:  Syliva Overman MD  CC:  lab results- new diabetic.  History of Present Illness: Pt recently dx with DM.  She has had hyperglycemia for the last couple of yrs.  With current dx guidelines pt meets diagnotic criteria for dx of DM.  She is here today to review labs and receive diabetic education.  She states she is overall feeling well.  Is surprised by her new Dx of DM, but is familiar with DM.  Her husband is also diabetic.  Pt states her last eye exam was a few yrs ago.  Discussed with pt that she needs to have this yrly now. Discussed monitoring feet. Encouraged dental exam and care every 6 mos.  Pt states it has been yrs since she has been to a dentist & seh knows she needs to have some teeth pulled. Discussed diet, exercise and wt loss. Discussed Metformin medication & side effects to watch for. Discussed the importance of diabetes control to reduce the risk of diabetic complications, including blindness, kidney failure, heart attacks and amputations.   Current Medications (verified): 1)  Oscal 500/200 D-3 500-200 Mg-Unit  Tabs (Calcium-Vitamin D) .... One Tab By Mouth Three Times A Day 2)  Multivitamins   Tabs (Multiple Vitamin) .... One Tab By Mouth Once Daily 3)  Anacin 81 Mg  Tbec (Aspirin) .... One Tab By Mouth Once Daily 4)  Toprol Xl 25 Mg   Tb24 (Metoprolol Succinate) .... Oen Tab By Mouth Two Times A Day 5)  Maxzide-25 37.5-25 Mg Tabs (Triamterene-Hctz) .... Take 1 Tablet By Mouth Once A Day 6)  Lovastatin 40 Mg Tabs (Lovastatin) .... Take 1 Tab By Mouth At Bedtime 7)  Terbinafine Hcl 250 Mg Tabs (Terbinafine Hcl) .... Take 1 Tablet By Mouth Once A Day  Allergies (verified): No Known Drug Allergies  Past History:  Past medical, surgical, family and social histories (including risk factors) reviewed, and no changes noted (except as noted below).  Past Medical History: RBBB (ICD-426.4) OBESITY (ICD-278.00) HYPERLIPIDEMIA (ICD-272.4)  Hypertension Diabetes mellitus, type II  Past Surgical History: Reviewed history from 12/01/2008 and no changes required. TAH and BSO  hysterectomy. right shoulder  Family History: Reviewed history from 12/06/2007 and no changes required. Mother living - HTN and arthritis Father deceased - stroke, HTN 4 sisters living - x1 DM and sarcoidosis 1 brother living - healthy 1 brother deceased - CHF  Social History: Reviewed history from 12/06/2007 and no changes required. Employed Married 0 children Never Smoked Alcohol use-no Drug use-no  Review of Systems General:  Denies chills and fever. CV:  Denies chest pain or discomfort and palpitations. Resp:  Denies cough and shortness of breath. GI:  Denies abdominal pain, change in bowel habits, nausea, and vomiting. Endo:  Denies excessive thirst and excessive urination.  Physical Exam  General:  Well-developed,well-nourished,in no acute distress; alert,appropriate and cooperative throughout examination Head:  Normocephalic and atraumatic without obvious abnormalities. No apparent alopecia or balding. Eyes:  No corneal or conjunctival inflammation noted. EOMI. Perrla. Funduscopic exam benign, without hemorrhages, exudates or papilledema.  Ears:  External ear exam shows no significant lesions or deformities.  Otoscopic examination  reveals clear canals, tympanic membranes are intact bilaterally without bulging, retraction, inflammation or discharge. Hearing is grossly normal bilaterally. Nose:  External nasal examination shows no deformity or inflammation. Nasal mucosa are pink and moist without lesions or exudates. Mouth:  Oral mucosa and oropharynx without lesions or exudates.  poor dentition and teeth missing.   Neck:  No deformities, masses, or tenderness noted. Lungs:  Normal respiratory effort, chest expands symmetrically. Lungs are clear to auscultation, no crackles or wheezes. Heart:  Normal rate and regular rhythm. S1 and S2 normal without gallop, murmur, click, rub or other extra sounds. Cervical Nodes:  No lymphadenopathy noted Psych:  Cognition and judgment appear intact. Alert and cooperative with normal attention span and concentration. No apparent delusions, illusions, hallucinations  Diabetes Management Exam:    Foot Exam (with socks and/or shoes not present):       Sensory-Monofilament:          Left foot: normal          Right foot: normal       Inspection:          Left foot: normal          Right foot: normal       Nails:          Left foot: fungal infection          Right foot: fungal infection   Impression & Recommendations:  Problem # 1:  DIABETES MELLITUS, TYPE II (ICD-250.00) Assessment New  Her updated medication list for this problem includes:    Anacin 81 Mg Tbec (Aspirin) ..... One tab by mouth once daily    Metformin Hcl 500 Mg Tabs (Metformin hcl) ..... One tab by mouth two times a day  Orders: T-CMP with estimated GFR ([address]) T- Hemoglobin A1C (95621-30865) TLB-Microalbumin/Creat Ratio, Urine (82043-MALB)  Problem # 2:  HYPERTENSION (ICD-401.9) Assessment: Comment Only  Her updated medication list for this problem includes:    Toprol Xl 25 Mg Tb24 (Metoprolol succinate) ..... Oen tab by mouth two times a day    Maxzide-25 37.5-25 Mg Tabs (Triamterene-hctz) .Marland Kitchen... Take  1 tablet by mouth once a day  BP today: 120/80 Prior BP: 110/80 (08/31/2009)  Labs Reviewed: K+: 4.1 (10/24/2009) Creat: : 0.78 (10/24/2009)   Chol: 160 (10/24/2009)   HDL: 39 (10/24/2009)   LDL: 98 (10/24/2009)   TG: 113 (10/24/2009)  Problem # 3:  HYPERLIPIDEMIA (ICD-272.4) Assessment: Comment Only  Her updated medication list for this problem includes:    Lovastatin 40 Mg Tabs (Lovastatin) .Marland Kitchen... Take 1 tab by mouth at bedtime  Orders: T-CMP with estimated GFR ([address]) T-Lipid Profile (28413-24401)  Labs Reviewed: SGOT: 17 (10/24/2009)   SGPT: 18 (10/24/2009)   HDL:39 (10/24/2009), 42 (06/22/2009)  LDL:98 (10/24/2009), 103 (02/72/5366)  Chol:160 (10/24/2009), 167 (06/22/2009)  Trig:113 (10/24/2009), 110 (06/22/2009)  Problem # 4:  OBESITY (ICD-278.00) Assessment: Comment Only  Ht: 62 (10/31/2009)   Wt: 207.25 (10/31/2009)   BMI: 38.04 (10/31/2009)  Complete Medication List: 1)  Oscal 500/200 D-3 500-200 Mg-unit  Tabs (Calcium-vitamin d) .... One tab by mouth three times a day 2)  Multivitamins Tabs (Multiple vitamin) .... One tab by mouth once daily 3)  Anacin 81 Mg Tbec (Aspirin) .... One tab by mouth once daily 4)  Toprol Xl 25 Mg Tb24 (Metoprolol succinate) .... Oen tab by mouth two times a day 5)  Maxzide-25 37.5-25 Mg Tabs (Triamterene-hctz) .... Take 1 tablet by mouth once a day 6)  Lovastatin 40 Mg Tabs (Lovastatin) .... Take 1 tab by mouth at bedtime 7)  Terbinafine Hcl 250 Mg Tabs (Terbinafine hcl) .... Take 1 tablet by mouth once a day 8)  Metformin Hcl 500 Mg Tabs (Metformin hcl) .... One tab by mouth two times a day  Other Orders: Pneumococcal Vaccine (78295) Admin 1st Vaccine (62130)  Patient Instructions: 1)  Please schedule a follow-up appointment in 3 months. 2)  It is important that you exercise regularly at least 20 minutes 5 times a week. If you develop chest pain, have severe difficulty breathing, or feel very tired , stop exercising immediately  and seek medical attention. 3)  You need to lose weight. Consider a lower calorie diet and regular exercise.  4)  Check your blood sugars regularly. If your readings are usually above 200 or below 70 you should contact our office. 5)  See your eye doctor yearly to check for diabetic eye damage. 6)  Check your feet each night for sore areas, calluses or signs of infection. 7)  I have ordered blood work for you to have drawn in 3 mos before your next appt. Prescriptions: METFORMIN HCL 500 MG TABS (METFORMIN HCL) one tab by mouth two times a day  #60 x 3   Entered by:   Adella Hare LPN   Authorized by:   Esperanza Sheets PA   Signed by:   Adella Hare LPN on 86/57/8469   Method used:   [facility] to        [facility] [phone][facility] (retail)       [address]       [place], [place]  [address]       Ph: [phone] or [phone]       Fax: [phone]   RxID:   [id number]    Immunizations Administered:  Pneumonia Vaccine:    Vaccine Type: Pneumovax    Site: left deltoid    Mfr: Merck    Dose: 0.5 ml    Route: IM    Given by: Adella Hare LPN    Exp. Date: 07/12/2010    Lot #: [id number]    VIS given: 01/12/96 version given Oct 31, 2009.

## 2010-07-16 NOTE — Assessment & Plan Note (Signed)
Summary: office visit   Vital Signs:  Patient profile:   60 year old female Menstrual status:  hysterectomy Height:      62 inches Weight:      205 pounds BMI:     37.63 O2 Sat:      95 % on Room air Pulse rate:   78 / minute Pulse rhythm:   regular Resp:     16 per minute BP sitting:   110 / 70  (left arm)  Vitals Entered By: Syliva Overman MD (April 16, 2010 3:14 PM)  Nutrition Counseling: Patient's BMI is greater than 25 and therefore counseled on weight management options.  O2 Flow:  Room air CC: follow-up visit Is Patient Diabetic? Yes Did you bring your meter with you today? No Pain Assessment Patient in pain? no        Primary Care Provider:  Syliva Overman MD  CC:  follow-up visit.  History of Present Illness: Reports  thatshe is now  doing well.she has had symptom resolution of vertigo since eNT eval and treatment, she feels well. Denies recent fever or chills. Denies sinus pressure, nasal congestion , ear pain or sore throat. Denies chest congestion, or cough productive of sputum. Denies chest pain, palpitations, PND, orthopnea or leg swelling. Denies abdominal pain, nausea, vomitting, diarrhea or constipation. Denies change in bowel movements or bloody stool. Denies dysuria , frequency, incontinence or hesitancy. Denies  joint pain, swelling, or reduced mobility. Denies headaches, vertigo, seizures. Denies depression, anxiety or insomnia. Denies  rash, lesions, or itch.     Current Medications (verified): 1)  Oscal 500/200 D-3 500-200 Mg-Unit  Tabs (Calcium-Vitamin D) .... One Tab By Mouth Three Times A Day 2)  Multivitamins   Tabs (Multiple Vitamin) .... One Tab By Mouth Once Daily 3)  Anacin 81 Mg  Tbec (Aspirin) .... One Tab By Mouth Once Daily 4)  Toprol Xl 25 Mg  Tb24 (Metoprolol Succinate) .... Oen Tab By Mouth Two Times A Day 5)  Terbinafine Hcl 250 Mg Tabs (Terbinafine Hcl) .... Take 1 Tablet By Mouth Once A Day 6)  Metformin Hcl 500  Mg Tabs (Metformin Hcl) .... One Tab By Mouth Two Times A Day 7)  Accu-Chek Multiclix Lancets  Misc (Lancets) .... Once Daily Testing 8)  Accu-Chek Aviva  Strp (Glucose Blood) .... Once Daily Testing 9)  Benazepril-Hydrochlorothiazide 20-12.5 Mg Tabs (Benazepril-Hydrochlorothiazide) .... Take 1 Tablet By Mouth Once A Day 10)  Lovastatin 40 Mg Tabs (Lovastatin) .... One Tab By Mouth At Bedtime 11)  Gentasol 0.3 % Soln (Gentamicin Sulfate) .... One Drop To Left Eye 3 Times Daily For 5 Days  Allergies (verified): No Known Drug Allergies  Review of Systems      See HPI General:  Complains of fatigue. Eyes:  Denies discharge and eye pain. Neuro:  Complains of sensation of room spinning. Endo:  Denies cold intolerance, excessive hunger, excessive thirst, excessive urination, and heat intolerance. Heme:  Denies abnormal bruising and bleeding. Allergy:  Complains of seasonal allergies; denies hives or rash and itching eyes.  Physical Exam  General:  Well-developed,obesein no acute distress; alert,appropriate and cooperative throughout examination HEENT: No facial asymmetry,  EOMI, No sinus tenderness, TM's Clear, oropharynx  pink and moist.   Chest: Clear to auscultation bilaterally.  CVS: S1, S2, No murmurs, No S3.   Abd: Soft, Nontender.  MS: Adequate ROM spine, hips, shoulders and knees.  Ext: No edema.   CNS: CN 2-12 intact, power tone and sensation normal  throughout.   Skin: Intact, no visible lesions or rashes.  Psych: Good eye contact, normal affect.  Memory intact, not anxious or depressed appearing.    Impression & Recommendations:  Problem # 1:  VERTIGO (ICD-780.4) Assessment Improved  The following medications were removed from the medication list:    Meclizine Hcl 25 Mg Tabs (Meclizine hcl) .Marland Kitchen... Take 1 tablet by mouth four times a day as needed for dizziness  Problem # 2:  DIABETES MELLITUS, TYPE II (ICD-250.00) Assessment: Unchanged  Her updated medication list for  this problem includes:    Anacin 81 Mg Tbec (Aspirin) ..... One tab by mouth once daily    Metformin Hcl 500 Mg Tabs (Metformin hcl) ..... One tab by mouth two times a day    Benazepril-hydrochlorothiazide 20-12.5 Mg Tabs (Benazepril-hydrochlorothiazide) .Marland Kitchen... Take 1 tablet by mouth once a day  Labs Reviewed: Creat: 0.67 (03/30/2010)    Reviewed HgBA1c results: 6.1 (03/30/2010)  6.0 (11/28/2009)  Problem # 3:  HYPERTENSION (ICD-401.9) Assessment: Unchanged  Her updated medication list for this problem includes:    Toprol Xl 25 Mg Tb24 (Metoprolol succinate) ..... Oen tab by mouth two times a day    Benazepril-hydrochlorothiazide 20-12.5 Mg Tabs (Benazepril-hydrochlorothiazide) .Marland Kitchen... Take 1 tablet by mouth once a day  Orders: T-Basic Metabolic Panel [phone])  BP today: 110/70 Prior BP: 124/80 (03/20/2010)  Labs Reviewed: K+: 4.2 (03/30/2010) Creat: : 0.67 (03/30/2010)   Chol: 235 (03/30/2010)   HDL: 41 (03/30/2010)   LDL: 172 (03/30/2010)   TG: 112 (03/30/2010)  Problem # 4:  OBESITY (ICD-278.00) Assessment: Unchanged  Ht: 62 (04/16/2010)   Wt: 205 (04/16/2010)   BMI: 37.63 (04/16/2010)  Problem # 5:  HYPERLIPIDEMIA (ICD-272.4) Assessment: Deteriorated  The following medications were removed from the medication list:    Lovastatin 40 Mg Tabs (Lovastatin) ..... One tab by mouth at bedtime Her updated medication list for this problem includes:    Lovastatin 40 Mg Tabs (Lovastatin) .Marland Kitchen..Marland Kitchen Two tablets at bedtime  Orders: T-Lipid Profile [id number]) T-Hepatic Function [phone])  Labs Reviewed: SGOT: 18 (03/30/2010)   SGPT: 17 (03/30/2010)   HDL:41 (03/30/2010), 38 (11/28/2009)  LDL:172 (03/30/2010), 116 (11/28/2009)  Chol:235 (03/30/2010), 175 (11/28/2009)  Trig:112 (03/30/2010), 106 (11/28/2009)  Complete Medication List: 1)  Oscal 500/200 D-3 500-200 Mg-unit Tabs (Calcium-vitamin d) .... One tab by mouth three times a day 2)  Multivitamins Tabs (Multiple  vitamin) .... One tab by mouth once daily 3)  Anacin 81 Mg Tbec (Aspirin) .... One tab by mouth once daily 4)  Toprol Xl 25 Mg Tb24 (Metoprolol succinate) .... Oen tab by mouth two times a day 5)  Terbinafine Hcl 250 Mg Tabs (Terbinafine hcl) .... Take 1 tablet by mouth once a day 6)  Metformin Hcl 500 Mg Tabs (Metformin hcl) .... One tab by mouth two times a day 7)  Accu-chek Multiclix Lancets Misc (Lancets) .... Once daily testing 8)  Accu-chek Aviva Strp (Glucose blood) .... Once daily testing 9)  Benazepril-hydrochlorothiazide 20-12.5 Mg Tabs (Benazepril-hydrochlorothiazide) .... Take 1 tablet by mouth once a day 10)  Gentasol 0.3 % Soln (Gentamicin sulfate) .... One drop to left eye 3 times daily for 5 days 11)  Lovastatin 40 Mg Tabs (Lovastatin) .... Two tablets at bedtime  Other Orders: Radiology Referral (Radiology)  Patient Instructions: 1)  cPE in 3 months 2)  It is important that you exercise regularly at least 20 minutes 5 times a week. If you develop chest pain, have severe difficulty breathing, or feel very tired ,  stop exercising immediately and seek medical attention. 3)  You need to lose weight. Consider a lower calorie diet and regular exercise.  4)  dose increase in the lovasatin 40mg  two at bwedtime, cut down on fried and fatty foods. 5)  [place] will be scheduled Prescriptions: TOPROL XL 25 MG  TB24 (METOPROLOL SUCCINATE) oen tab by mouth two times a day  #60 Tablet x 3   Entered by:   Adella Hare LPN   Authorized by:   Syliva Overman MD   Signed by:   Adella Hare LPN on 16/03/9603   Method used:   [facility]ronically to        [facility] [phone]* (retail)       [address]       [place], [place]  [address]       Ph: [phone] or [phone]       Fax: [phone]   RxID:   [id number] LOVASTATIN 40 MG TABS (LOVASTATIN) two tablets at bedtime  #60 x 4   Entered and Authorized by:   Syliva Overman MD   Signed by:   Syliva Overman MD on  04/16/2010   Method used:   Printed then faxed to ...       [facility]  [address] [phone]* (retail)       [address]       [place], [place]  [address]       Ph: [phone] or [phone]       Fax: [phone]   RxID:   [id number]    Orders Added: 1)  Est. Patient Level IV [[address]] 2)  T-Basic Metabolic Panel [id number] 3)  T-Lipid Profile [80061-22930] 4)  T-Hepatic Function [10626-94854] 5)  Radiology Referral [Radiology]

## 2010-07-16 NOTE — Letter (Signed)
Summary: MISC  MISC   Imported By: Lind Guest 11/05/2009 15:04:43  _____________________________________________________________________  External Attachment:    Type:   Image     Comment:   External Document

## 2010-07-16 NOTE — Progress Notes (Signed)
Summary: [facility]  Phone Note Call from Patient   Summary of Call: [facility] [place]  CALL BACK AT [phone] WHEN DONE Initial call taken by: Lind Guest,  January 31, 2010 1:18 PM    Prescriptions: ACCU-CHEK AVIVA  STRP (GLUCOSE BLOOD) once daily testing  #50 x 5   Entered by:   Everitt Amber LPN   Authorized by:   Syliva Overman MD   Signed by:   Everitt Amber LPN on 88/41/6606   Method used:   Electronically to        [facility] [phone]* (retail)       [address]       [place], [place]  [address]       Ph: [phone] or [phone]       Fax: [phone]   RxID:   [id number]

## 2010-07-16 NOTE — Assessment & Plan Note (Signed)
Summary: office visit   Vital Signs:  Patient profile:   60 year old female Menstrual status:  hysterectomy Height:      62 inches Weight:      203.75 pounds BMI:     37.40 O2 Sat:      100 % on Room air Pulse rate:   76 / minute Pulse rhythm:   regular Resp:     16 per minute BP sitting:   130 / 90  (left arm)  Vitals Entered By: Adella Hare LPN (January 31, 2010 8:52 AM)  Nutrition Counseling: Patient's BMI is greater than 25 and therefore counseled on weight management options.  O2 Flow:  Room air CC: follow-up visit Is Patient Diabetic? Yes Did you bring your meter with you today? No Pain Assessment Patient in pain? no      Comments patients left eye appears to be irritates and runny   Primary Care Provider:  Syliva Overman MD  CC:  follow-up visit.  History of Present Illness: Reports  that she has been  doing well. Denies recent fever or chills. Denies sinus pressure, nasal congestion , ear pain or sore throat. Denies chest congestion, or cough productive of sputum. Denies chest pain, palpitations, PND, orthopnea or leg swelling. Denies abdominal pain, nausea, vomitting, diarrhea or constipation. Denies change in bowel movements or bloody stool. Denies dysuria , frequency, incontinence or hesitancy. Denies  joint pain, swelling, or reduced mobility. Denies headaches, vertigo, seizures. Denies depression, anxiety or insomnia. Denies  rash, lesions, or itch.     Current Medications (verified): 1)  Oscal 500/200 D-3 500-200 Mg-Unit  Tabs (Calcium-Vitamin D) .... One Tab By Mouth Three Times A Day 2)  Multivitamins   Tabs (Multiple Vitamin) .... One Tab By Mouth Once Daily 3)  Anacin 81 Mg  Tbec (Aspirin) .... One Tab By Mouth Once Daily 4)  Toprol Xl 25 Mg  Tb24 (Metoprolol Succinate) .... Oen Tab By Mouth Two Times A Day 5)  Maxzide-25 37.5-25 Mg Tabs (Triamterene-Hctz) .... Take 1 Tablet By Mouth Once A Day 6)  Lovastatin 40 Mg Tabs (Lovastatin) ....  Take 1 Tab By Mouth At Bedtime 7)  Terbinafine Hcl 250 Mg Tabs (Terbinafine Hcl) .... Take 1 Tablet By Mouth Once A Day 8)  Metformin Hcl 500 Mg Tabs (Metformin Hcl) .... One Tab By Mouth Once Daily 9)  Accu-Chek Multiclix Lancets  Misc (Lancets) .... Once Daily Testing 10)  Accu-Chek Aviva  Strp (Glucose Blood) .... Once Daily Testing  Allergies (verified): No Known Drug Allergies  Review of Systems Eyes:  Complains of discharge; 2 week h/o discharge from left eye with some burning. Endo:  Denies excessive thirst and excessive urination; fasting sugars repoprtedly generally less than 130. Heme:  Denies abnormal bruising and bleeding. Allergy:  Denies hives or rash and itching eyes.  Physical Exam  General:  Well-developed,well-nourished,in no acute distress; alert,appropriate and cooperative throughout examination HEENT: No facial asymmetry,  EOMI, No sinus tenderness, TM's Clear, oropharynx  pink and moist.Poor dentition. Left conjunctival erythema with watery drainage   Chest: Clear to auscultation bilaterally.  CVS: S1, S2, No murmurs, No S3.   Abd: Soft, Nontender.  MS: Adequate ROM spine, hips, shoulders and knees.  Ext: No edema.   CNS: CN 2-12 intact, power tone and sensation normal throughout.   Skin: Intact, no visible lesions or rashes.  Psych: Good eye contact, normal affect.  Memory intact, not anxious or depressed appearing.    Impression & Recommendations:  Problem #  1:  CONJUNCTIVITIS (ICD-372.30) Assessment Comment Only  Her updated medication list for this problem includes:    Gentasol 0.3 % Soln (Gentamicin sulfate) ..... One drop to left eye 3 times daily for 5 days  Problem # 2:  DIABETES MELLITUS, TYPE II (ICD-250.00) Assessment: Improved  Her updated medication list for this problem includes:    Anacin 81 Mg Tbec (Aspirin) ..... One tab by mouth once daily    Metformin Hcl 500 Mg Tabs (Metformin hcl) ..... One tab by mouth once daily     Benazepril-hydrochlorothiazide 20-12.5 Mg Tabs (Benazepril-hydrochlorothiazide) .Marland Kitchen... Take 1 tablet by mouth once a day  Orders: T- Hemoglobin A1C ([id number])  Labs Reviewed: Creat: 0.79 (11/28/2009)    Reviewed HgBA1c results: 6.0 (11/28/2009)  6.6 (10/25/2009)  Problem # 3:  HYPERTENSION (ICD-401.9) Assessment: Comment Only  The following medications were removed from the medication list:    Maxzide-25 37.5-25 Mg Tabs (Triamterene-hctz) .Marland Kitchen... Take 1 tablet by mouth once a day Her updated medication list for this problem includes:    Toprol Xl 25 Mg Tb24 (Metoprolol succinate) ..... Oen tab by mouth two times a day    Benazepril-hydrochlorothiazide 20-12.5 Mg Tabs (Benazepril-hydrochlorothiazide) .Marland Kitchen... Take 1 tablet by mouth once a day  Orders: T-Basic Metabolic Panel [phone])  BP today: 130/90, med change to include ACE in light of new dx of DM Prior BP: 120/80 (10/31/2009)  Labs Reviewed: K+: 3.9 (11/28/2009) Creat: : 0.79 (11/28/2009)   Chol: 175 (11/28/2009)   HDL: 38 (11/28/2009)   LDL: 116 (11/28/2009)   TG: 106 (11/28/2009)  Problem # 4:  HYPERLIPIDEMIA (ICD-272.4) Assessment: Deteriorated  The following medications were removed from the medication list:    Lovastatin 40 Mg Tabs (Lovastatin) .Marland Kitchen... Take 1 tab by mouth at bedtime Her updated medication list for this problem includes:    Lovastatin 40 Mg Tabs (Lovastatin) .Marland Kitchen... 2 tablest at bedtime  Orders: T-Hepatic Function [id number]) T-Lipid Profile [id number])  Labs Reviewed: SGOT: 16 (11/28/2009)   SGPT: 16 (11/28/2009)   HDL:38 (11/28/2009), 39 (10/24/2009)  LDL:116 (11/28/2009), 98 (12/14/1599)  Chol:175 (11/28/2009), 160 (10/24/2009)  Trig:106 (11/28/2009), 113 (10/24/2009)  Complete Medication List: 1)  Oscal 500/200 D-3 500-200 Mg-unit Tabs (Calcium-vitamin d) .... One tab by mouth three times a day 2)  Multivitamins Tabs (Multiple vitamin) .... One tab by mouth once daily 3)  Anacin 81 Mg  Tbec (Aspirin) .... One tab by mouth once daily 4)  Toprol Xl 25 Mg Tb24 (Metoprolol succinate) .... Oen tab by mouth two times a day 5)  Terbinafine Hcl 250 Mg Tabs (Terbinafine hcl) .... Take 1 tablet by mouth once a day 6)  Metformin Hcl 500 Mg Tabs (Metformin hcl) .... One tab by mouth once daily 7)  Accu-chek Multiclix Lancets Misc (Lancets) .... Once daily testing 8)  Accu-chek Aviva Strp (Glucose blood) .... Once daily testing 9)  Benazepril-hydrochlorothiazide 20-12.5 Mg Tabs (Benazepril-hydrochlorothiazide) .... Take 1 tablet by mouth once a day 10)  Lovastatin 40 Mg Tabs (Lovastatin) .... 2 tablest at bedtime 11)  Gentasol 0.3 % Soln (Gentamicin sulfate) .... One drop to left eye 3 times daily for 5 days  Patient Instructions: 1)  Please schedule a follow-up appointment in 2 months. 2)  BMP prior to visit, ICD-9: 3)  Hepatic Panel prior to visit, ICD-9: 4)  Lipid Panel prior to visit, ICD-9:   fasting in 2 months  in sEPTEMBER 5)  HbgA1C prior to visit, ICD-9: 6)  pLS stop the maxzide, new bP med 7)  dose inc on lovastatin to TWO at bedtime 8)  eye drops have been sent in ,if no better next week pls see your eye doctor Prescriptions: TOPROL XL 25 MG  TB24 (METOPROLOL SUCCINATE) oen tab by mouth two times a day  #60 Tablet x 3   Entered by:   Adella Hare LPN   Authorized by:   Syliva Overman MD   Signed by:   Adella Hare LPN on 16/03/9603   Method used:   [facility] to        [facility] [phone][facility] (retail)       [address]       [place], [place]  [address]       Ph: [phone] or [phone]       Fax: [phone]   RxID:   [id number] GENTASOL 0.3 % SOLN (GENTAMICIN SULFATE) one drop to left eye 3 times daily for 5 days  #58ml x 0   Entered and Authorized by:   Syliva Overman MD   Signed by:   Syliva Overman MD on 01/31/2010   Method used:   [facility]ically to        [facility] [phone]* (retail)       [address]       [place], [place]  [address]       Ph: [phone] or [phone]       Fax: [phone]   RxID:   [id number] LOVASTATIN 40 MG TABS (LOVASTATIN) 2 tablest at bedtime  #60 x 3   Entered and Authorized by:   Syliva Overman MD   Signed by:   Syliva Overman MD on 01/31/2010   Method used:   Printed then faxed to ...       [facility]  [address] [phone]* (retail)       [address]       [place], [place]  [address]       Ph: [phone] or [phone]       Fax: [phone]   RxID:   [id number] BENAZEPRIL-HYDROCHLOROTHIAZIDE 20-12.5 MG TABS (BENAZEPRIL-HYDROCHLOROTHIAZIDE) Take 1 tablet by mouth once a day  #30 x 3   Entered and Authorized by:   Syliva Overman MD   Signed by:   Syliva Overman MD on 01/31/2010   Method used:   Printed then faxed to ...       [facility]  [address] [phone]* (retail)       [address]       [place], [place]  [address]       Ph: [phone] or [phone]       Fax: [phone]   RxID:   [id number]

## 2010-07-16 NOTE — Letter (Signed)
Summary: x rays  x rays   Imported By: Lind Guest 11/05/2009 13:58:50  _____________________________________________________________________  External Attachment:    Type:   Image     Comment:   External Document

## 2010-07-17 ENCOUNTER — Other Ambulatory Visit (HOSPITAL_COMMUNITY)
Admission: RE | Admit: 2010-07-17 | Discharge: 2010-07-17 | Disposition: A | Discharge status: Home / Self Care | Payer: PRIVATE HEALTH INSURANCE | Source: Ambulatory Visit | Attending: Family Medicine | Admitting: Family Medicine

## 2010-07-17 ENCOUNTER — Encounter: Payer: Self-pay | Admitting: Family Medicine

## 2010-07-17 ENCOUNTER — Encounter (INDEPENDENT_AMBULATORY_CARE_PROVIDER_SITE_OTHER): Payer: PRIVATE HEALTH INSURANCE | Admitting: Family Medicine

## 2010-07-17 ENCOUNTER — Other Ambulatory Visit: Payer: Self-pay | Admitting: Family Medicine

## 2010-07-17 ENCOUNTER — Ambulatory Visit: Admit: 2010-07-17 | Payer: Self-pay | Admitting: Family Medicine

## 2010-07-17 DIAGNOSIS — Z0000 Encounter for general adult medical examination without abnormal findings: Secondary | ICD-10-CM

## 2010-07-17 DIAGNOSIS — Z1211 Encounter for screening for malignant neoplasm of colon: Secondary | ICD-10-CM

## 2010-07-17 DIAGNOSIS — Z124 Encounter for screening for malignant neoplasm of cervix: Secondary | ICD-10-CM

## 2010-07-17 DIAGNOSIS — Z01419 Encounter for gynecological examination (general) (routine) without abnormal findings: Secondary | ICD-10-CM | POA: Insufficient documentation

## 2010-07-18 DIAGNOSIS — J309 Allergic rhinitis, unspecified: Secondary | ICD-10-CM | POA: Insufficient documentation

## 2010-07-19 ENCOUNTER — Encounter: Payer: Self-pay | Admitting: Family Medicine

## 2010-07-19 NOTE — Letter (Signed)
Summary: demo  demo   Imported By: Lind Guest 11/05/2009 13:53:07  _____________________________________________________________________  External Attachment:    Type:   Image     Comment:   External Document

## 2010-07-19 NOTE — Letter (Signed)
Summary: dr.teoh / vertigo  dr.teoh / vertigo   Imported By: Lind Guest 04/03/2010 17:15:57  _____________________________________________________________________  External Attachment:    Type:   Image     Comment:   External Document

## 2010-07-24 NOTE — Letter (Signed)
Summary: d/c medicne  d/c medicne   Imported By: Lind Guest 07/17/2010 11:24:55  _____________________________________________________________________  External Attachment:    Type:   Image     Comment:   External Document

## 2010-07-24 NOTE — Letter (Signed)
Summary: Pap Smear, Normal Letter, [facility]  [address]   [place], [place] [address]   Phone: [phone]  Fax: [phone]          July 19, 2010    Dear: Samantha Vega    I am pleased to notify you that your PAP smear was normal.  You will need your next PAP smear in:     ____ 3 Months    ____ 6 Months    ____ 12 Months    Please call the office at our office number above, to schedule your next appointment.    Sincerely,     [facility]

## 2010-07-24 NOTE — Assessment & Plan Note (Signed)
Summary: CPE   Vital Signs:  Patient profile:   60 year old female Menstrual status:  hysterectomy Height:      62 inches Weight:      203.50 pounds BMI:     37.36 O2 Sat:      96 % Pulse rate:   80 / minute Pulse rhythm:   regular Resp:     16 per minute BP sitting:   114 / 80  (left arm) Cuff size:   large  Vitals Entered By: Everitt Amber LPN (July 17, 2010 8:26 AM)  Nutrition Counseling: Patient's BMI is greater than 25 and therefore counseled on weight management options. CC: CPE  Vision Screening:Left eye w/o correction: 20 / 40 Right Eye w/o correction: 20 / 40 Both eyes w/o correction:  20/ 25  Color vision testing: normal     Vision Comments: without glasses because they are not for seeing far off  Vision Entered By: Everitt Amber LPN (July 17, 2010 8:28 AM)   Primary Care Provider:  Syliva Overman MD  CC:  CPE.  History of Present Illness: Reports  that Joya Salm has been doing wqell, except for excessive post nasal drainage and cough, sometimes experiencedas a dry tickle in the throat. Denies recent fever or chills. Denies sinus pressure, nasal congestion , ear pain or sore throat. Denies chest congestion, or cough productive of sputum. Denies chest pain, palpitations, PND, orthopnea or leg swelling. Denies abdominal pain, nausea, vomitting, diarrhea or constipation. Denies change in bowel movements or bloody stool. Denies dysuria , frequency, incontinence or hesitancy.  Denies headaches, vertigo, seizures. Denies depression, anxiety or insomnia. Denies  rash, lesions, or itch.     Current Medications (verified): 1)  Oscal 500/200 D-3 500-200 Mg-Unit  Tabs (Calcium-Vitamin D) .... One Tab By Mouth Three Times A Day 2)  Multivitamins   Tabs (Multiple Vitamin) .... One Tab By Mouth Once Daily 3)  Anacin 81 Mg  Tbec (Aspirin) .... One Tab By Mouth Once Daily 4)  Toprol Xl 25 Mg  Tb24 (Metoprolol Succinate) .... Oen Tab By Mouth Two Times A Day 5)   Terbinafine Hcl 250 Mg Tabs (Terbinafine Hcl) .... Take 1 Tablet By Mouth Once A Day 6)  Metformin Hcl 500 Mg Tabs (Metformin Hcl) .... One Tab By Mouth Two Times A Day 7)  Accu-Chek Multiclix Lancets  Misc (Lancets) .... Once Daily Testing 8)  Accu-Chek Aviva  Strp (Glucose Blood) .... Once Daily Testing 9)  Benazepril-Hydrochlorothiazide 20-12.5 Mg Tabs (Benazepril-Hydrochlorothiazide) .... Take 1 Tablet By Mouth Once A Day 10)  Lovastatin 40 Mg Tabs (Lovastatin) .... Two Tablets At Bedtime  Allergies (verified): 1)  ! Ace Inhibitors  Review of Systems      See HPI Eyes:  Denies discharge and red eye. ENT:  Complains of postnasal drainage. MS:  Complains of low back pain and mid back pain; chronic back and neck pain. Endo:  Denies cold intolerance, excessive hunger, excessive thirst, excessive urination, and heat intolerance; fasting sugars seldom over 120. Heme:  Denies abnormal bruising and bleeding. Allergy:  Complains of seasonal allergies; uncontrolled symptoms.  Physical Exam  General:  Well-developed,obese,in no acute distress; alert,appropriate and cooperative throughout examination Head:  Normocephalic and atraumatic without obvious abnormalities. No apparent alopecia or balding. Eyes:  No corneal or conjunctival inflammation noted. EOMI. Perrla. Funduscopic exam benign, without hemorrhages or xudates. Vision grossly normal. Ears:  External ear exam shows no significant lesions or deformities.  Otoscopic examination reveals clear canals, tympanic  membranes are intact bilaterally without bulging, retraction, inflammation or discharge. Hearing is grossly normal bilaterally. Nose:  External nasal examination shows no deformity or inflammation. Nasal mucosa are pink and moist without lesions or exudates. Mouth:  pharynx pink and moist, poor dentition, and teeth missing.   Neck:  No deformities, masses, or tenderness noted.decreased rom cervical spine Chest Wall:  No deformities,  masses, or tenderness noted. Breasts:  No mass, nodules, thickening, tenderness, bulging, retraction, inflamation, nipple discharge or skin changes noted.   Lungs:  Normal respiratory effort, chest expands symmetrically. Lungs are clear to auscultation, no crackles or wheezes. Heart:  Normal rate and regular rhythm. S1 and S2 normal without gallop, murmur, click, rub or other extra sounds. Abdomen:  Bowel sounds positive,abdomen soft and non-tender without masses, organomegaly or hernias noted. Rectal:  No external abnormalities noted. Normal sphincter tone. No rectal masses or tenderness. Genitalia:  Normal introitus for age, no external lesions, no vaginal discharge, mucosa pink and moist, no vaginal or cervical lesions, no vaginal atrophy, no friaility or hemorrhage, normal uterus size and position, no adnexal masses or tenderness Msk:  No deformity or scoliosis noted of thoracic or lumbar spine.   Pulses:  R and L carotid,radial,femoral,dorsalis pedis and posterior tibial pulses are full and equal bilaterally Extremities:  No clubbing, cyanosis, edema, or deformity noted with decreased ROM spine, adequate in hips, knees and shoulders  Neurologic:  No cranial nerve deficits noted. Station and gait are normal. Plantar reflexes are down-going bilaterally. DTRs are symmetrical throughout. Sensory, motor and coordinative functions appear intact. Skin:  Intact without suspicious lesions or rashes Cervical Nodes:  No lymphadenopathy noted Axillary Nodes:  No palpable lymphadenopathy Inguinal Nodes:  No significant adenopathy Psych:  Cognition and judgment appear intact. Alert and cooperative with normal attention span and concentration. No apparent delusions, illusions, hallucinations  Diabetes Management Exam:    Foot Exam (with socks and/or shoes not present):       Sensory-Monofilament:          Left foot: normal          Right foot: normal       Inspection:          Left foot: abnormal              Comments: callous          Right foot: abnormal             Comments: callous       Nails:          Left foot: fungal infection          Right foot: fungal infection   Impression & Recommendations:  Problem # 1:  DIABETES MELLITUS, TYPE II (ICD-250.00) Assessment Comment Only  The following medications were removed from the medication list:    Benazepril-hydrochlorothiazide 20-12.5 Mg Tabs (Benazepril-hydrochlorothiazide) .Marland Kitchen... Take 1 tablet by mouth once a day Her updated medication list for this problem includes:    Anacin 81 Mg Tbec (Aspirin) ..... One tab by mouth once daily    Metformin Hcl 500 Mg Tabs (Metformin hcl) ..... One tab by mouth two times a day Patient advised to reduce carbs and sweets, commit to regular physical activity, take meds as prescribed, test blood sugars as directed, and attempt to lose weight , to improve blood sugar control.  Orders: T- Hemoglobin A1C ([id number])  Labs Reviewed: Creat: 0.78 (07/13/2010)    Reviewed HgBA1c results: 6.1 (03/30/2010)  6.0 (11/28/2009)  Problem # 2:  HYPERTENSION (ICD-401.9) Assessment: Comment Only  The following medications were removed from the medication list:    Benazepril-hydrochlorothiazide 20-12.5 Mg Tabs (Benazepril-hydrochlorothiazide) .Marland Kitchen... Take 1 tablet by mouth once a day Her updated medication list for this problem includes:    Toprol Xl 25 Mg Tb24 (Metoprolol succinate) ..... Oen tab by mouth two times a day    Amlodipine Besylate 5 Mg Tabs (Amlodipine besylate) .Marland Kitchen... Take 1 tablet by mouth once a day discontinue benazepril/hctz effective 07/17/2010  Orders: T-Basic Metabolic Panel [id number])  BP today: 114/80, nmed changed due to suspected aCE coughPrior BP: 110/70 (04/16/2010)  Labs Reviewed: K+: 3.8 (07/13/2010) Creat: : 0.78 (07/13/2010)   Chol: 169 (07/13/2010)   HDL: 42 (07/13/2010)   LDL: 109 (07/13/2010)   TG: 89 (07/13/2010)  Problem # 3:  OBESITY (ICD-278.00) Assessment:  Improved  Ht: 62 (07/17/2010)   Wt: 203.50 (07/17/2010)   BMI: 37.36 (07/17/2010) therapeutic lifestyle change discussed and encouraged  Problem # 4:  HYPERLIPIDEMIA (ICD-272.4) Assessment: Improved  Her updated medication list for this problem includes:    Lovastatin 40 Mg Tabs (Lovastatin) .Marland Kitchen..Marland Kitchen Two tablets at bedtime Low fat dietdiscussed and encouraged  Orders: T-Lipid Profile [phone]) T-Hepatic Function [phone])  Labs Reviewed: SGOT: 16 (07/13/2010)   SGPT: 13 (07/13/2010)   HDL:42 (07/13/2010), 41 (03/30/2010)  LDL:109 (07/13/2010), 172 (03/30/2010)  Chol:169 (07/13/2010), 235 (03/30/2010)  Trig:89 (07/13/2010), 112 (03/30/2010)  Problem # 5:  ALLERGIC RHINITIS CAUSE UNSPECIFIED (ICD-477.9) Assessment: Deteriorated  Her updated medication list for this problem includes:    Fluticasone Propionate 50 Mcg/act Susp (Fluticasone propionate) .Marland Kitchen... 2 puffs per nostril once daily as needed for allergies  Complete Medication List: 1)  Oscal 500/200 D-3 500-200 Mg-unit Tabs (Calcium-vitamin d) .... One tab by mouth three times a day 2)  Multivitamins Tabs (Multiple vitamin) .... One tab by mouth once daily 3)  Anacin 81 Mg Tbec (Aspirin) .... One tab by mouth once daily 4)  Toprol Xl 25 Mg Tb24 (Metoprolol succinate) .... Oen tab by mouth two times a day 5)  Metformin Hcl 500 Mg Tabs (Metformin hcl) .... One tab by mouth two times a day 6)  Accu-chek Multiclix Lancets Misc (Lancets) .... Once daily testing 7)  Accu-chek Aviva Strp (Glucose blood) .... Once daily testing 8)  Lovastatin 40 Mg Tabs (Lovastatin) .... Two tablets at bedtime 9)  Fluticasone Propionate 50 Mcg/act Susp (Fluticasone propionate) .... 2 puffs per nostril once daily as needed for allergies 10)  Amlodipine Besylate 5 Mg Tabs (Amlodipine besylate) .... Take 1 tablet by mouth once a day discontinue benazepril/hctz effective 07/17/2010  Other Orders: Pap Smear ([id number]) Hemoccult Guaiac-1 spec.(in office)  (84132)  Patient Instructions: 1)  Please schedule anurse visit for BP check in 6 weeeks, and an MD  follow-up appointment in 3 months. 2)  It is important that you exercise regularly at least 20 minutes 5 times a week. If you develop chest pain, have severe difficulty breathing, or feel very tired , stop exercising immediately and seek medical attention. 3)  You need to lose weight. Consider a lower calorie diet and regular exercise. 4)  pls reduce your fried foods, sweets and carbs, you are making good progress  5)  BMP prior to visit, ICD-9: 6)  Hepatic Panel prior to visit, ICD-9: 7)  Lipid Panel prior to visit, ICD-9:  fasting in 3 months. 8)  HbgA1C prior to visit, ICD-9: 9)  New nose spray for allergies 10)  PLS stop your current BP med, benazepril/hctz today, new  is amlodipine 5mg . I believe the BP med is a part of the problem with coughing Prescriptions: AMLODIPINE BESYLATE 5 MG TABS (AMLODIPINE BESYLATE) Take 1 tablet by mouth once a day discontinue benazepril/hctz effective 07/17/2010  #30 x 3   Entered and Authorized by:   Syliva Overman MD   Signed by:   Syliva Overman MD on 07/17/2010   Method used:   Printed then faxed to ...       [facility]. [phone]* (retail)       [address]       [place], [place]  [address]       Ph: [phone]       Fax: [phone]   RxID:   [id number] FLUTICASONE PROPIONATE 50 MCG/ACT SUSP (FLUTICASONE PROPIONATE) 2 puffs per nostril once daily as needed for allergies  #1 x 3   Entered and Authorized by:   Syliva Overman MD   Signed by:   Syliva Overman MD on 07/17/2010   Method used:   Electronically to        [facility]. [phone]* (retail)       [address]       [place], [place]  [address]       Ph: [phone]       Fax: [phone]   RxID:   [id number]    Orders Added: 1)  Est. Patient 40-64 years [99396] 2)  T-Basic Metabolic Panel [phone] 3)  T-Lipid Profile  [phone] 4)  T-Hepatic Function [[id number]] 5)  T- Hemoglobin A1C [83036-23375] 6)  Pap Smear [88150] 7)  Hemoccult Guaiac-1 spec.(in office) [82270]       Laboratory Results  Date/Time Received: July 17, 2010 9:35 AM  Date/Time Reported: July 17, 2010 9:35 AM   Stool - Occult Blood Hemmoccult #1: negative Date: 07/17/2010 Comments: 5030 5/14 51301 13L 10/13 Adella Hare LPN  July 17, 2010 9:36 AM

## 2010-07-29 ENCOUNTER — Encounter: Payer: Self-pay | Admitting: Family Medicine

## 2010-07-29 ENCOUNTER — Ambulatory Visit (INDEPENDENT_AMBULATORY_CARE_PROVIDER_SITE_OTHER): Payer: PRIVATE HEALTH INSURANCE | Admitting: Family Medicine

## 2010-07-29 DIAGNOSIS — M25579 Pain in unspecified ankle and joints of unspecified foot: Secondary | ICD-10-CM | POA: Insufficient documentation

## 2010-07-29 DIAGNOSIS — I10 Essential (primary) hypertension: Secondary | ICD-10-CM

## 2010-07-29 DIAGNOSIS — E785 Hyperlipidemia, unspecified: Secondary | ICD-10-CM

## 2010-07-31 ENCOUNTER — Ambulatory Visit: Payer: PRIVATE HEALTH INSURANCE | Admitting: Family Medicine

## 2010-08-04 DIAGNOSIS — M940 Chondrocostal junction syndrome [Tietze]: Secondary | ICD-10-CM | POA: Insufficient documentation

## 2010-08-07 NOTE — Letter (Signed)
Summary: Out of Work  [facility]  [address]   [place], [place] [address]   Phone: [phone]  Fax: [phone]    July 29, 2010   Employee:  KHRYSTAL JEANMARIE    To Whom It May Concern:   For Medical reasons, please excuse the above named employee from work for the following dates:  Start:   07/29/10  End:   07/31/10 to return with no restrictions  If you need additional information, please feel free to contact our office.         Sincerely,    Milus Mallick. Lodema Hong, MD

## 2010-08-13 NOTE — Assessment & Plan Note (Signed)
Summary: sick   Vital Signs:  Patient profile:   60 year old female Menstrual status:  hysterectomy Height:      62 inches Weight:      206.75 pounds BMI:     37.95 O2 Sat:      95 % Pulse rate:   73 / minute Pulse rhythm:   regular Resp:     16 per minute BP sitting:   126 / 82  (left arm) Cuff size:   large  Vitals Entered By: Everitt Amber LPN (July 29, 2010 1:44 PM)  Nutrition Counseling: Patient's BMI is greater than 25 and therefore counseled on weight management options. CC: c/o both ankles swelling and hurting x a few days, also has a place on her chest that is very sore to the touch, started hurting the other day at work then became very sore and still hurting    Primary Care Provider:  Syliva Overman MD  CC:  c/o both ankles swelling and hurting x a few days, also has a place on her chest that is very sore to the touch, and started hurting the other day at work then became very sore and still hurting .  History of Present Illness: 3 day h/o bilateral ankle pain and swelling, right worse than left, also reproduciblr right ant chest wall pain, worse with direct pressure, non radiaiting , no associated nausea, diaphoresis or light headedness. Reports  that she had been doing well prior to this. Denies recent fever or chills. Denies sinus pressure, nasal congestion , ear pain or sore throat. Denies chest congestion, or cough productive of sputum. Denies palpitations, PND, orthopnea or leg swelling. Denies abdominal pain, nausea, vomitting, diarrhea or constipation. Denies change in bowel movements or bloody stool. Denies dysuria , frequency, incontinence or hesitancy.  Denies headaches, vertigo, seizures. Denies depression, anxiety or insomnia. Denies  rash, lesions, or itch.     Current Medications (verified): 1)  Oscal 500/200 D-3 500-200 Mg-Unit  Tabs (Calcium-Vitamin D) .... One Tab By Mouth Three Times A Day 2)  Multivitamins   Tabs (Multiple Vitamin) ....  One Tab By Mouth Once Daily 3)  Anacin 81 Mg  Tbec (Aspirin) .... One Tab By Mouth Once Daily 4)  Toprol Xl 25 Mg  Tb24 (Metoprolol Succinate) .... Oen Tab By Mouth Two Times A Day 5)  Metformin Hcl 500 Mg Tabs (Metformin Hcl) .... One Tab By Mouth Two Times A Day 6)  Accu-Chek Multiclix Lancets  Misc (Lancets) .... Once Daily Testing 7)  Accu-Chek Aviva  Strp (Glucose Blood) .... Once Daily Testing 8)  Lovastatin 40 Mg Tabs (Lovastatin) .... Two Tablets At Bedtime 9)  Fluticasone Propionate 50 Mcg/act Susp (Fluticasone Propionate) .... 2 Puffs Per Nostril Once Daily As Needed For Allergies 10)  Amlodipine Besylate 5 Mg Tabs (Amlodipine Besylate) .... Take 1 Tablet By Mouth Once A Day Discontinue Benazepril/hctz Effective 07/17/2010  Allergies (verified): 1)  ! Ace Inhibitors  Review of Systems      See HPI General:  Complains of fatigue. Eyes:  Complains of vision loss-both eyes. CV:  Complains of chest pain or discomfort; denies fatigue, lightheadness, palpitations, shortness of breath with exertion, swelling of feet, and swelling of hands; right chest pain aggravated by direct palpation. MS:  Complains of joint pain and stiffness. Endo:  Denies excessive thirst and excessive urination. Heme:  Denies abnormal bruising, bleeding, enlarge lymph nodes, and fevers. Allergy:  Denies hives or rash and itching eyes.  Physical Exam  General:  Well-developed,obese,in no acute distress; alert,appropriate and cooperative throughout examination HEENT: No facial asymmetry,  EOMI, No sinus tenderness, TM's Clear, oropharynx  pink and moist.   Chest: Clear to auscultation bilaterally. tender over 4th right CC junction CVS: S1, S2, No murmurs, No S3.   Abd: Soft, Nontender.  MS: Adequate ROM spine, hips, shoulders and knees. decreased ROM ankles, right greater than left with warmth and tenderness Ext: No edema.   CNS: CN 2-12 intact, power tone and sensation normal throughout.   Skin: Intact, no  visible lesions or rashes.  Psych: Good eye contact, normal affect.  Memory intact, not anxious or depressed appearing.    Impression & Recommendations:  Problem # 1:  ANKLE PAIN, BILATERAL (ICD-719.47) Assessment Deteriorated  Orders: Depo- Medrol 80mg  (J1040) Ketorolac-Toradol 15mg  (Z6109) Admin of Therapeutic Inj  intramuscular or subcutaneous (60454)  Problem # 2:  DIABETES MELLITUS, TYPE II (ICD-250.00) Assessment: Comment Only  Her updated medication list for this problem includes:    Anacin 81 Mg Tbec (Aspirin) ..... One tab by mouth once daily    Metformin Hcl 500 Mg Tabs (Metformin hcl) ..... One tab by mouth two times a day Patient advised to reduce carbs and sweets, commit to regular physical activity, take meds as prescribed, test blood sugars as directed, and attempt to lose weight , to improve blood sugar control.  Labs Reviewed: Creat: 0.78 (07/13/2010)    Reviewed HgBA1c results: 6.1 (03/30/2010)  6.0 (11/28/2009)  Problem # 3:  HYPERTENSION (ICD-401.9) Assessment: Unchanged  Her updated medication list for this problem includes:    Toprol Xl 25 Mg Tb24 (Metoprolol succinate) ..... Oen tab by mouth two times a day    Amlodipine Besylate 5 Mg Tabs (Amlodipine besylate) .Marland Kitchen... Take 1 tablet by mouth once a day discontinue benazepril/hctz effective 07/17/2010  BP today: 126/82 Prior BP: 114/80 (07/17/2010)  Labs Reviewed: K+: 3.8 (07/13/2010) Creat: : 0.78 (07/13/2010)   Chol: 169 (07/13/2010)   HDL: 42 (07/13/2010)   LDL: 109 (07/13/2010)   TG: 89 (07/13/2010)  Problem # 4:  OBESITY (ICD-278.00) Assessment: Improved  Ht: 62 (07/29/2010)   Wt: 206.75 (07/29/2010)   BMI: 37.95 (07/29/2010) therapeutic lifestyle change discussed and encouraged  Problem # 5:  COSTOCHONDRITIS (ICD-733.6) Assessment: Deteriorated  Complete Medication List: 1)  Oscal 500/200 D-3 500-200 Mg-unit Tabs (Calcium-vitamin d) .... One tab by mouth three times a day 2)   Multivitamins Tabs (Multiple vitamin) .... One tab by mouth once daily 3)  Anacin 81 Mg Tbec (Aspirin) .... One tab by mouth once daily 4)  Toprol Xl 25 Mg Tb24 (Metoprolol succinate) .... Oen tab by mouth two times a day 5)  Metformin Hcl 500 Mg Tabs (Metformin hcl) .... One tab by mouth two times a day 6)  Accu-chek Multiclix Lancets Misc (Lancets) .... Once daily testing 7)  Accu-chek Aviva Strp (Glucose blood) .... Once daily testing 8)  Lovastatin 40 Mg Tabs (Lovastatin) .... Two tablets at bedtime 9)  Fluticasone Propionate 50 Mcg/act Susp (Fluticasone propionate) .... 2 puffs per nostril once daily as needed for allergies 10)  Amlodipine Besylate 5 Mg Tabs (Amlodipine besylate) .... Take 1 tablet by mouth once a day discontinue benazepril/hctz effective 07/17/2010 11)  Ibuprofen 600 Mg Tabs (Ibuprofen) .... Take 1 tablet by mouth three times a day 12)  Terbinafine Hcl 250 Mg Tabs (Terbinafine hcl) .... Take 1 tablet by mouth once a day  Other Orders: T-Uric Acid (Blood) (09811-91478)  Patient Instructions: 1)  F/u as before. 2)  You will receive injections in the office today for ankle pain and swelling, also meds will be sent in . this will treat your chest wall pain also. 3)  Work excuse today to return 07/31/2010. 4)  Uric acid level to be added to labs for next draw. 5)  Pls try lose weight so less presure on your ankles Prescriptions: PREDNISONE (PAK) 5 MG TABS (PREDNISONE) Use as directed  #21 x 0   Entered and Authorized by:   Syliva Overman MD   Signed by:   Syliva Overman MD on 07/29/2010   Method used:   Electronically to        [facility] [phone]* (retail)       [address]       [place], [place]  [address]       Ph: [phone]       Fax: [phone]   RxID:   [id number] IBUPROFEN 600 MG TABS (IBUPROFEN) Take 1 tablet by mouth three times a day  #21 x -0   Entered and Authorized by:   Syliva Overman MD   Signed by:   Syliva Overman MD on 07/29/2010   Method used:   Electronically to        [facility] [phone]* (retail)       [address]       [place], [place]  [address]       Ph: [phone]       Fax: [phone]   RxID:   [id number]    Medication Administration  Injection # 1:    Medication: Depo- Medrol 80mg     Diagnosis: ANKLE PAIN, BILATERAL (ICD-719.47)    Route: IM    Site: RUOQ gluteus    Exp Date: 07/12    Lot #: Gunnar [id number]    Mfr: Pharmacia    Patient tolerated injection without complications    Given by: Adella Hare LPN (July 29, 2010 4:05 PM)  Injection # 2:    Medication: Ketorolac-Toradol 15mg     Diagnosis: ANKLE PAIN, BILATERAL (ICD-719.47)    Route: IM    Site: LUOQ gluteus    Exp Date: 11/15/2011    Lot #: [id number]    Mfr: [email or web address]    Comments: toradol 60mg  given    Patient tolerated injection without complications    Given by: Adella Hare LPN (July 29, 2010 4:06 PM)  Orders Added: 1)  Est. Patient Level IV [31517] 2)  T-Uric Acid (Blood) [61607-37106] 3)  Depo- Medrol 80mg  [J1040] 4)  Ketorolac-Toradol 15mg  [J1885] 5)  Admin of Therapeutic Inj  intramuscular or subcutaneous [96372]     Medication Administration  Injection # 1:    Medication: Depo- Medrol 80mg     Diagnosis: ANKLE PAIN, BILATERAL (ICD-719.47)    Route: IM    Site: RUOQ gluteus    Exp Date: 07/12    Lot #: Gunnar [id number]    Mfr: Pharmacia    Patient tolerated injection without complications    Given by: Adella Hare LPN (July 29, 2010 4:05 PM)  Injection # 2:    Medication: Ketorolac-Toradol 15mg     Diagnosis: ANKLE PAIN, BILATERAL (ICD-719.47)    Route: IM    Site: LUOQ gluteus    Exp Date: 11/15/2011    Lot #: [id number]    Mfr: [email or web address]    Comments: toradol 60mg  given    Patient tolerated injection without complications  Given by: Adella Hare LPN (July 29, 2010 4:06 PM)  Orders Added: 1)  Est. Patient Level IV [65784] 2)  T-Uric Acid (Blood)  [69629-52841] 3)  Depo- Medrol 80mg  [J1040] 4)  Ketorolac-Toradol 15mg  [J1885] 5)  Admin of Therapeutic Inj  intramuscular or subcutaneous [32440]

## 2010-08-28 LAB — DIFFERENTIAL
Basophils Absolute: 0 10*3/uL (ref 0.0–0.1)
Basophils Relative: 1 % (ref 0–1)
Eosinophils Absolute: 0.2 10*3/uL (ref 0.0–0.7)
Neutro Abs: 2.5 10*3/uL (ref 1.7–7.7)
Neutrophils Relative %: 51 % (ref 43–77)

## 2010-08-28 LAB — URINALYSIS, ROUTINE W REFLEX MICROSCOPIC
Nitrite: NEGATIVE
Protein, ur: NEGATIVE mg/dL
Specific Gravity, Urine: 1.015 (ref 1.005–1.030)
Urobilinogen, UA: 0.2 mg/dL (ref 0.0–1.0)

## 2010-08-28 LAB — BASIC METABOLIC PANEL
CO2: 29 mEq/L (ref 19–32)
Calcium: 9.5 mg/dL (ref 8.4–10.5)
Creatinine, Ser: 0.63 mg/dL (ref 0.4–1.2)
GFR calc Af Amer: >60 mL/min (ref >60)
GFR calc non Af Amer: >60 mL/min (ref >60)
Glucose, Bld: 93 mg/dL (ref 70–99)
Sodium: 137 mEq/L (ref 135–145)

## 2010-08-28 LAB — URINE CULTURE

## 2010-08-28 LAB — CBC
Hemoglobin: 11.4 g/dL — ABNORMAL LOW (ref 12.0–15.0)
MCH: 29.4 pg (ref 26.0–34.0)
MCHC: 33.7 g/dL (ref 30.0–36.0)
Platelets: 190 10*3/uL (ref 150–400)

## 2010-09-21 ENCOUNTER — Other Ambulatory Visit: Payer: Self-pay | Admitting: Family Medicine

## 2010-10-03 ENCOUNTER — Other Ambulatory Visit: Payer: Self-pay | Admitting: Family Medicine

## 2010-10-07 ENCOUNTER — Other Ambulatory Visit: Payer: Self-pay

## 2010-10-07 MED ORDER — METFORMIN HCL 500 MG PO TABS: 500.0000 mg | ORAL_TABLET | Freq: Two times a day (BID) | ORAL | Status: DC

## 2010-10-10 ENCOUNTER — Encounter: Payer: Self-pay | Admitting: Family Medicine

## 2010-10-11 ENCOUNTER — Other Ambulatory Visit: Payer: Self-pay | Admitting: Family Medicine

## 2010-10-14 ENCOUNTER — Other Ambulatory Visit: Payer: Self-pay | Admitting: Family Medicine

## 2010-10-15 ENCOUNTER — Encounter: Payer: Self-pay | Admitting: Family Medicine

## 2010-10-15 LAB — HEPATIC FUNCTION PANEL
Indirect Bilirubin: 0.2 mg/dL (ref 0.0–0.9)
Total Bilirubin: 0.3 mg/dL (ref 0.3–1.2)
Total Protein: 7.2 g/dL (ref 6.0–8.3)

## 2010-10-15 LAB — LIPID PANEL
LDL Cholesterol: 119 mg/dL — ABNORMAL HIGH (ref 0–99)
Triglycerides: 106 mg/dL (ref <150)
VLDL: 21 mg/dL (ref 0–40)

## 2010-10-15 LAB — BASIC METABOLIC PANEL
BUN: 16 mg/dL (ref 6–23)
CO2: 26 mEq/L (ref 19–32)
Chloride: 104 mEq/L (ref 96–112)
Creat: 0.59 mg/dL (ref 0.40–1.20)
Glucose, Bld: 100 mg/dL — ABNORMAL HIGH (ref 70–99)
Potassium: 4.1 mEq/L (ref 3.5–5.3)

## 2010-10-15 LAB — HEMOGLOBIN A1C: Mean Plasma Glucose: 123 mg/dL — ABNORMAL HIGH (ref <117)

## 2010-10-16 ENCOUNTER — Ambulatory Visit (INDEPENDENT_AMBULATORY_CARE_PROVIDER_SITE_OTHER): Payer: PRIVATE HEALTH INSURANCE | Admitting: Family Medicine

## 2010-10-16 ENCOUNTER — Encounter: Payer: Self-pay | Admitting: Family Medicine

## 2010-10-16 ENCOUNTER — Other Ambulatory Visit: Payer: Self-pay | Admitting: Family Medicine

## 2010-10-16 VITALS — BP 140/92 | HR 88 | Resp 16 | Ht 61.5 in | Wt 214.0 lb

## 2010-10-16 DIAGNOSIS — E119 Type 2 diabetes mellitus without complications: Secondary | ICD-10-CM

## 2010-10-16 DIAGNOSIS — E785 Hyperlipidemia, unspecified: Secondary | ICD-10-CM

## 2010-10-16 DIAGNOSIS — E669 Obesity, unspecified: Secondary | ICD-10-CM

## 2010-10-16 DIAGNOSIS — I10 Essential (primary) hypertension: Secondary | ICD-10-CM

## 2010-10-16 MED ORDER — IBUPROFEN 600 MG PO TABS: 600.0000 mg | ORAL_TABLET | Freq: Three times a day (TID) | ORAL | Status: DC | PRN

## 2010-10-16 MED ORDER — LOVASTATIN 10 MG PO TABS: 10.0000 mg | ORAL_TABLET | Freq: Every day | ORAL | Status: DC

## 2010-10-16 MED ORDER — ROSUVASTATIN CALCIUM 20 MG PO TABS: 20.0000 mg | ORAL_TABLET | Freq: Every day | ORAL | Status: DC

## 2010-10-16 MED ORDER — METOPROLOL SUCCINATE ER 25 MG PO TB24: 25.0000 mg | ORAL_TABLET | Freq: Two times a day (BID) | ORAL | Status: DC

## 2010-10-16 MED ORDER — METFORMIN HCL 500 MG PO TABS: 500.0000 mg | ORAL_TABLET | Freq: Two times a day (BID) | ORAL | Status: DC

## 2010-10-16 MED ORDER — BENAZEPRIL-HYDROCHLOROTHIAZIDE 20-12.5 MG PO TABS: 1.0000 | ORAL_TABLET | Freq: Every day | ORAL | Status: DC

## 2010-10-16 NOTE — Patient Instructions (Addendum)
F/u in 3 months.  Fasting labs in 3 months  Your blood pressure  Is too high, you need to take your med every day  New med for your cholesterol which is too high It is important that you exercise regularly at least 30 minutes 5 times a week. If you develop chest pain, have severe difficulty breathing, or feel very tired, stop exercising immediately and seek medical attention  A healthy diet is rich in fruit, vegetables and whole grains. Poultry fish, nuts and beans are a healthy choice for protein rather then red meat. A low sodium diet and drinking 64 ounces of water daily is generally recommended. Oils and sweet should be limited. Carbohydrates especially for those who are diabetic or overweight, should be limited to 30-45 gram per meal. It is important to eat on a regular schedule, at least 3 times daily. Snacks should be primarily vegetables or nuts.

## 2010-10-16 NOTE — Assessment & Plan Note (Signed)
Medication compliance addressed. Commitment to regular exercise and healthy  food choices, with portion control discussed. DASH diet and low fat diet discussed and literature offered. Changes in medication made at this visit.  

## 2010-10-19 NOTE — Assessment & Plan Note (Signed)
Medication compliance addressed. Commitment to regular exercise and healthy  food choices, with portion control discussed. DASH diet and low fat diet discussed and literature offered. No Changes in medication made at this visit.  

## 2010-10-19 NOTE — Assessment & Plan Note (Signed)
Unchanged. Patient re-educated about  the importance of commitment to a  minimum of 150 minutes of exercise per week. The importance of healthy food choices with portion control discussed. Encouraged to start a food diary, count calories and to consider  joining a support group. Sample diet sheets offered. Goals set by the patient for the next several months.    

## 2010-10-19 NOTE — Assessment & Plan Note (Signed)
Controlled, no change in medication  

## 2010-10-19 NOTE — Progress Notes (Signed)
  Subjective:    Patient ID: Samantha Vega, female    DOB: [birth year], 60 y.o.   MRN: [id number]  HPI HYPERTENSION Disease Monitoring Blood pressure range-unknown Chest pain- no      Dyspnea- no Medications Compliance- needs improverment Lightheadedness- no   Edema- no   DIABETES Disease Monitoring Blood Sugar ranges-fasting 90 to 120 Polyuria- no New Visual problems- no Medications Compliance- fair Hypoglycemic symptoms- no   HYPERLIPIDEMIA Disease Monitoring See symptoms for Hypertension Medications Compliance- good RUQ pain- no  Muscle aches- no    Review of Systems Denies recent fever or chills. Denies sinus pressure, nasal congestion, ear pain or sore throat. Denies chest congestion, productive cough or wheezing. Denies chest pains, palpitations, paroxysmal nocturnal dyspnea, orthopnea and leg swelling Denies abdominal pain, nausea, vomiting,diarrhea or constipation.  Denies rectal bleeding or change in bowel movement. Denies dysuria, frequency, hesitancy or incontinence. Denies joint pain, swelling and limitation in mobility. Denies headaches, seizure, numbness, or tingling. Denies depression, anxiety or insomnia. Denies skin break down or rash.        Objective:   Physical Exam Patient alert and oriented and in no Cardiopulmonary distress.  HEENT: No facial asymmetry, EOMI, no sinus tenderness, TM's clear, Oropharynx pink and moist.  Neck supple no adenopathy.  Chest: Clear to auscultation bilaterally.  CVS: S1, S2 no murmurs, no S3.  ABD: Soft non tender. Bowel sounds normal.  Ext: No edema  MS: Adequate ROM spine, shoulders, hips and knees.  Skin: Intact, no ulcerations or rash noted.  Psych: Good eye contact, normal affect. Memory intact not anxious or depressed appearing.  CNS: CN 2-12 intact, power, tone and sensation normal throughout.       Assessment & Plan:

## 2010-10-29 NOTE — Procedures (Signed)
Samantha Vega, Samantha [name]:  [id number]   MEDICAL RECORD NO.:  [id number]          PATIENT TYPE:  OUT   LOCATION:  RAD                           FACILITY:  [facility]   PHYSICIAN:  Gerrit Friends. Dietrich Pates, MD, FACCDATE OF BIRTH:  [birth year]   DATE OF PROCEDURE:  10/28/2006  DATE OF DISCHARGE:                                ECHOCARDIOGRAM   CLINICAL DATA:  60 year old woman with EKG abnormalities.   1. Technically suboptimal but adequate echocardiographic study.  2. Normal left atrium, right atrium and right ventricle.  3. Normal proximal ascending aorta and aortic arch; mild calcification      of the wall and annulus.  4. Normal mitral valve; mild annular calcification.  5. Normal aortic valve.  6. Normal tricuspid valve.  7. Normal pulmonic valve and proximal pulmonary artery.  8. Normal left ventricular size; no LVH; normal regional and global      function.  9. Normal IVC.  10.A catheter is visualized near the aortic arch.  This may be      contained within the SVC.      Gerrit Friends. Dietrich Pates, MD, [facility]     RMR/MEDQ  D:  10/28/2006  T:  10/29/2006  Job:  [id number]

## 2010-10-29 NOTE — Letter (Signed)
November 24, 2006    Milus Mallick. Lodema Hong, M.D.  [address], [address]  [place], [place] [address]   RE:  Samantha Vega, Samantha Vega  MRN:  [id number]  /  DOB:  Nov 13, 1950   Dear Samantha Vega:   Samantha Vega returns to the office for continued assessment and treatment  of atypical chest discomfort.  She continues to have some soreness in  the region of her rib sternal articulations, particularly in the upper  right chest.  She has had some sharp momentary discomfort.  Overall, she  feels better.   CURRENT MEDICATIONS:  Include:  1. Aspirin.  2. Atorvastatin 20 mg daily.  3. Metoprolol 25 mg b.i.d.   An echocardiogram showed no significant cardiac abnormalities.  TSH  level was normal.   EXAMINATION:  A pleasant woman, in no acute distress.  The weight is 202, stable.  Blood pressure 120/80.  Heart rate 85 and  regular.  Respirations 16.  NECK:  No jugular venous distention.  LUNGS:  Clear.  CARDIAC:  Fourth sound and minimally early systolic ejection murmur.  EXTREMITIES:  No edema.  Distal pulses intact.   EKG:  Normal sinus rhythm, right bundle branch block, left anterior  fascicular block, markedly delayed R wave progression, cannot exclude  prior anterolateral myocardial infarction.  No changed when compared  with a prior tracing of September 25, 2006.   IMPRESSION:  Samantha Vega is doing fairly well on low-dose metoprolol.  Her principle current chest discomfort is clearly musculoskeletal with  tenderness on examination.  I have suggested she use Tylenol or Aleve as  necessary.  The magnitude of the discomfort is such that she is not even  inclined to treat it.  I will plan to reassess this nice woman again in  6 months.    Sincerely,      Samantha Friends. Dietrich Pates, MD, [facility]  Electronically Signed    RMR/MedQ  DD: 11/24/2006  DT: 11/24/2006  Job #: [id number]

## 2010-10-29 NOTE — Letter (Signed)
Oct 28, 2006    Milus Mallick. Lodema Hong, M.D.  [address], [address]  [place], [place] [address]   RE:  Samantha Vega, Samantha Vega  MRN:  [id number]  /  DOB:  Oct 22, 1950   Dear Claris Che:   Samantha Vega returns to the office for continued reassessment of  palpitations and chest discomfort.  Since her last visit, she carried an  Event recorder.  She indicated only 2 symptomatic spells.  A PVC was  present on that event and on multiple automatic detections.  There were  no other significant arrhythmias.  Heart rate was on the high side;  sometimes mean heart rate was above 110 for hours at a time.  Otherwise,  she has felt generally well.  Chest discomfort does not appear to be a  significant aspect of her symptoms at present.  She is particularly  symptomatic when she lies down to sleep.   On exam, a pleasant woman in no acute distress.  The weight is 202, two pounds more than in 2004.  Blood pressure 115/75,  heart rate is 75 and regular. Respirations 16.  NECK:  No jugular venous distention; no carotid bruits.  LUNGS:  Few basilar rales on the left.  CARDIAC:  4th heart sound and modest systolic ejection murmur.  EXTREMITIES:  No edema.   IMPRESSION:  Samantha Vega does not appear to have any significant  arrhythmias.  TSH is normal.  Due to her symptomatic premature  ventricular contractions, we will start metoprolol at a dose of 25 mg  b.i.d.  This would be effective for her borderline hypertension, but  that does not appear to be a problem at present.  An echocardiogram will  also be obtained.  I will plan to reassess this nice woman in 3 weeks.    Sincerely,      Gerrit Friends. Dietrich Pates, MD, [facility]    RMR/MedQ  DD: 10/28/2006  DT: 10/28/2006  Job #: [id number]

## 2010-11-01 NOTE — Op Note (Signed)
[name], [name]:  [id number]   MEDICAL RECORD NO.:  [id number]          PATIENT TYPE:  AMB   LOCATION:  DAY                           FACILITY:  [facility]   PHYSICIAN:  Vickki Hearing, M.D.DATE OF BIRTH:  1951/05/05   DATE OF PROCEDURE:  08/06/2004  DATE OF DISCHARGE:                                 OPERATIVE REPORT   HISTORY:  This is a 60 year old female with right shoulder pain, stiffness,  loss of motion, function and associated weakness. She underwent physical  therapy, had a subacromial injection and anti-inflammatories and did not  improve and presented for open acromioplasty and Mumford procedure after an  MRI indicated large AC joint spur and arthritis.   PREOPERATIVE DIAGNOSIS:  Osteoarthritis, right shoulder acromioclavicular  joint.   POSTOPERATIVE DIAGNOSIS:  Osteoarthritis, right shoulder acromioclavicular  joint.   SECONDARY DIAGNOSIS:  Rotator cuff syndrome.   TERTIARY DIAGNOSIS:  Adhesive capsulitis.   SURGEON:  Dr. Romeo Apple.   ANESTHETIC:  General.   OPERATIVE FINDINGS:  There was degenerative acromioclavicular joint with a  large inferior spur. There was a type 2 acromion. There was adhesive  capsulitis of the glenohumeral joint. Rotator cuff was intact.   The patient was identified as Samantha Vega the preop holding area. The  right shoulder was marked as the surgical site and countersigned by the  surgeon. She was given a gram of Ancef and taken to the operating room for  general anesthesia. Right upper extremity was prepped and draped using  sterile technique. At that point, a time-out was taken to confirm the  procedure, extremity and confirm the patient as Samantha Vega. We also  confirmed antibiotics to be given with an hour skin incision and all  equipment was in the room.   The subcutaneous tissue was injected with Marcaine and dilute epinephrine  solution. An incision was made over the [facility] joint and extended  across the  anterior lateral edge of the acromion and into the middle and anterior third  of the deltoid. After dividing the subcutaneous tissue, a full-thickness  musculoperiosteal flap was created using the interval between the anterior  and middle deltoid, extending across the acromion and acromioclavicular  joint. The [facility] joint was resected using an oscillating saw, and acromioplasty  was performed. The rotator cuff was inspected after bursectomy. The edges of  the bone were smoothed with a bur and a rasp. Wound was irrigated. The bone  edges were covered with bone wax. Rotator cuff was inspected, found to be  intact. Complete bursectomy was performed. At this point, manipulation under  anesthesia was performed, obtaining full with flexion, external rotation and  abduction of the shoulder. Wound was irrigated. The deep tissues were  injected with 20 cc of plain Marcaine using #1 Bralon interrupted  nonabsorbable suture. The deltoid interval was closed. Subcutaneous tissue  was closed with 2-0  Monocryl, and a pain pump catheter was placed and activated. The wound was  closed with skin staples. Sterile bandage was applied along with a CryoCuff.  The patient was extubated and taken to recovery room in stable condition  SEH/MEDQ  D:  08/06/2004  T:  08/06/2004  Job:  [id number]

## 2010-11-01 NOTE — H&P (Signed)
NAMEGELENA, KLOSINSKI NO.:  [id number]   MEDICAL RECORD NO.:  [id number]          PATIENT TYPE:  AMB   LOCATION:  DAY                           FACILITY:  [facility]   PHYSICIAN:  Vickki Hearing, M.D.DATE OF BIRTH:  1951/05/31   DATE OF ADMISSION:  DATE OF DISCHARGE:  LH                                HISTORY & PHYSICAL   CHIEF COMPLAINT:  Right shoulder pain.   HISTORY OF PRESENT ILLNESS:  Ms. Villari is a 60 year old female.  She has  had right shoulder pain.  She has been treated with nonoperative therapy,  including anti-inflammatories, injection and therapy.  Her pain was not  relieved.  She was worked up for rotator cuff tear.  She had a negative MRI  for rotator cuff tear.  She did have degenerative changes in the shoulder  joint, tendonopathy of the rotator cuff and AC joint degeneration.  She  presents now for an open subacromial decompression and Mumford procedure.   OTHER MEDICAL HISTORY:  No major medical problems.   PREVIOUS SURGERY:  Hysterectomy.   ALLERGIES:  None.   MEDICATIONS:  Tramadol and Lipitor.   FAMILY HISTORY:  Negative.   PHYSICAL EXAMINATION:  WEIGHT:  196 pounds.  VITAL SIGNS:  Pulse 70, respiratory rate 18.  GENERAL APPEARANCE:  Normal.  NECK:  Supple.  CHEST:  Clear.  HEART:  Regular rate and rhythm.  ABDOMEN:  Soft.  EXTREMITIES:  Right shoulder flexion is limited to 90 degrees before she has  a lot of pain.  She has tenderness over the [facility] joint.  She has an equivocal  impingement sign.  She cannot actively flex past 90 degrees and this is  painful from 90 degrees to 110 degrees passive range of motion.   Her rotator cuff is weak.  She has normal neurologic function in the upper  extremity.   IMPRESSION:  1.  Rotator cuff syndrome.  2.  Acromioclavicular joint arthritis.  3.  Rotator cuff tendonopathy.   PLAN:  Open Mumford and subacromial decompression of the right shoulder.      SEH/MEDQ  D:  08/05/2004  T:   08/05/2004  Job:  [id number]

## 2010-11-01 NOTE — H&P (Signed)
Samantha Vega, Samantha Vega             ACCOUNT NO.:  [id number]   MEDICAL RECORD NO.:  [id number]          PATIENT TYPE:  AMB   LOCATION:  DAY                           FACILITY:  [facility]   PHYSICIAN:  Jerolyn Shin C. [name], M.D.   DATE OF BIRTH:  1951-03-31   DATE OF ADMISSION:  DATE OF DISCHARGE:  LH                                HISTORY & PHYSICAL   HISTORY OF PRESENT ILLNESS:  The patient is a 60 year old female referred  for colonoscopy.  She has regular bowel movements.  She denies rectal  bleeding.  Has no difficulty with bowel habits.  There is a very strong  history of colon cancer in the family.   PAST MEDICAL HISTORY:  Positive for hypertension.   MEDICATIONS:  1.  Vitamins with calcium.  2.  Vitamin D.  3.  Aspirin.   ALLERGIES:  No known drug allergies.   PAST SURGICAL HISTORY:  1.  Operation on the right shoulder.  2.  Total abdominal hysterectomy.   PHYSICAL EXAMINATION:  VITAL SIGNS:  Blood pressure 140/90, pulse 77,  respirations 20, weight 199 pounds.  HEENT:  Unremarkable.  NECK:  Supple.  No JVD, bruits, adenopathy, or thyromegaly.  CHEST:  Clear to auscultation.  HEART:  Regular rate and rhythm without murmur, gallop or rub.  ABDOMEN:  Soft, nontender.  No masses.  There is a well-healed, long midline  scar.  EXTREMITIES:  No cyanosis or clubbing.  There is 1+ edema right leg and  ankle.  NEUROLOGIC:  No focal motor, sensory or cerebellar deficits.   IMPRESSION:  1.  Need for screening colonoscopy.  2.  Family history of colon cancer.  3.  Hypertension.   PLAN:  Screening colonoscopy.      Dirk Dress. [name], M.D.  Electronically Signed     LCS/MEDQ  D:  03/05/2005  T:  03/06/2005  Job:  [id number]   cc:   [facility]  Fax: [phone]
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
## 2010-11-01 NOTE — Discharge Summary (Signed)
NAME:  Samantha Vega, Samantha Vega                       ACCOUNT NO.:  [id number]   MEDICAL RECORD NO.:  [id number]                   PATIENT TYPE:  OBV   LOCATION:  A302                                 FACILITY:  [facility]   PHYSICIAN:  Vania Rea, M.D.              DATE OF BIRTH:  1950-09-12   DATE OF ADMISSION:  10/11/2003  DATE OF DISCHARGE:                                 DISCHARGE SUMMARY   PRIMARY CARE PHYSICIAN:  Milus Mallick. Lodema Hong, M.D.   DISCHARGE DIAGNOSES:  1. Generalized weakness, resolved.  2. Hypokalemia, resolved.  3. Hypertension, controlled.  4. Hypotension, resolved.  5. History of lower extremity deep venous thrombosis.  6. History of hyperlipidemia.   DISPOSITION:  Discharge to home.   DISCHARGE CONDITION:  Stable.   DISCHARGE MEDICATIONS:  1. Lipitor 20 mg at bedtime.  2. KCL 20 mEq twice daily for three days.  3. Coumadin 5 mg each evening.   HOSPITAL COURSE:  Please refer to history and physical of April 27.  This is  a 60 year old African-American lady who was recently started on  hydrochlorothiazide and was sent for an x-ray for pain in her right  shoulder.  She became very weak in the x-ray department and was transferred  to the emergency room where she was found to be hypokalemic with a potassium  of 3.1 which was really borderline.  The patient had cardiac enzymes done  during the hospital stay and over a 24 hour period they are negative for any  acute cardiac event.  She received replacement potassium IV and oral and her  hydrochlorothiazide was withheld.  She had one dose of lisinopril 10 mg p.o.  yesterday.  This morning her blood pressures have been on the low side  although she was not orthostatic.  However, her blood pressures have now  returned to normal with all blood pressure medications being held.  The  patient has been ambulating the halls of the hospital without difficulty.   SUBJECTIVE:  GENERAL:  She feels fine and has no complaints.  VITAL SIGNS:  Temperature is 97.6.  This morning her pulse was 64 and  respirations were 20.  Blood pressure is 83/50 currently.  Temperature is  97.6.  Pulse 76.  Respirations 18.  Blood pressure 106/67.  HEENT:  Pupils equal, round and reactive to light.  She is no dehydrated.  She has no jugular venous distention.  CHEST:  Clear to auscultation bilaterally.  CARDIOVASCULAR SYSTEM:  She has a regular rhythm and no murmurs.  ABDOMEN:  Obese, soft and non-tender.  LOWER EXTREMITIES:  She has no edema.  CNS:  Cranial nerves are intact.  She moves all limbs equally and reflexes  are normal.   LABORATORY DATA:  Hemoglobin and hematocrit remain normal close to admission  values at 12.0/35.5 and white count remains at 4.8.  Magnesium is normal at  2.0.  Serum chemistries:  Potassium today is 3.7,  sodium 135, chloride 28,  BUN 15, creatinine 0.7, calcium 9.3, glucose 116.  Her last set of enzymes  showed a total CK of 66, CK MB is 1.4 and the Troponin is less than 0.01.   ASSESSMENT:  1. Profound weakness due to hypokalemia.  2. Hypertension, currently normotensive without medication.  3. History of hyperlipidemia.  Continue Lipitor.   SPECIAL INSTRUCTIONS:  1. Take potassium tablets as prescribed for three days until finished.   FOLLOW UP:  1. Primary with primary care physician within a week.  2. Work with primary care physician to decide whether or not to restart     antihypertensive medication.  Although the patient has a history of     recurrent lower extremity swelling, if diuretics are instituted, it may     be best to use them along with potassium supplements.     ___________________________________________                                         Vania Rea, M.D.   LC/MEDQ  D:  10/12/2003  T:  10/12/2003  Job:  [id number]   cc:   Milus Mallick. Lodema Hong, M.D.  [address]  [place], [place] [address]  Fax: [phone]

## 2010-11-01 NOTE — Procedures (Signed)
Samantha Vega, CADOTTE NO.:  [id number]   MEDICAL RECORD NO.:  [id number]          PATIENT TYPE:  INP   LOCATION:  A201                          FACILITY:  [facility]   PHYSICIAN:  Vida Roller, M.D.   DATE OF BIRTH:  12-Jul-1950   DATE OF PROCEDURE:  04/26/2004  DATE OF DISCHARGE:                                  ECHOCARDIOGRAM   TAPE NUMBER:  LB [id number], tape count [id number] through 4950.   INDICATION:  A 60 year old woman with syncope.  No previous studies.   TECHNICAL QUALITY:  Difficult as some of the apical views were inadequate.   M-MODE TRACINGS:  1.  The aorta is 32 mm.  2.  The left atrium is 39 mm.  3.  The septum is 10 mm.  4.  Posterior wall is 9 mm.  5.  The left ventricular diastolic dimension is 40 mm.  6.  The left ventricular systolic dimension is 30 mm.   2-D AND DOPPLER IMAGING:  1.  The left ventricle is normal size with normal systolic function.  The      estimated ejection fraction 55-60%.  There are no obvious wall-motion      abnormalities.  There is an element of upper septal hypertrophy which is      of no clinical significance, with no left ventricular outflow gradient.  2.  The right ventricle is normal size with normal systolic function.  3.  Both atria are normal size.  4.  The aortic valve is mildly sclerotic with no evidence of stenosis or      regurgitation.  5.  The mitral valve has some annular calcification.  There is trivial      insufficiency.  No stenosis is seen.  6.  The tricuspid valve has trace insufficiency.  7.  The pulmonic valve has trace insufficiency.  8.  The inferior vena cava is normal size.  9.  There is no pericardial effusion.  10. The ascending aorta was not well seen.     Trey Paula   JH/MEDQ  D:  04/26/2004  T:  04/26/2004  Job:  [id number]

## 2010-11-01 NOTE — H&P (Signed)
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  See next note.      SEH/MEDQ  D:  08/05/2004  T:  08/05/2004  Job:  [id number]
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
## 2010-11-28 ENCOUNTER — Other Ambulatory Visit: Payer: Self-pay | Admitting: Family Medicine

## 2010-12-03 ENCOUNTER — Telehealth: Payer: Self-pay | Admitting: Family Medicine

## 2010-12-03 NOTE — Telephone Encounter (Signed)
meds were refilled today 

## 2011-01-15 ENCOUNTER — Encounter: Payer: Self-pay | Admitting: Family Medicine

## 2011-01-15 LAB — BASIC METABOLIC PANEL WITH GFR
CO2: 27 mEq/L (ref 19–32)
Calcium: 9.6 mg/dL (ref 8.4–10.5)
GFR, Est African American: >60 mL/min (ref >60)
Potassium: 4.1 mEq/L (ref 3.5–5.3)
Sodium: 140 mEq/L (ref 135–145)

## 2011-01-15 LAB — HEMOGLOBIN A1C
Hgb A1c MFr Bld: 6.7 % — ABNORMAL HIGH (ref <5.7)
Mean Plasma Glucose: 146 mg/dL — ABNORMAL HIGH (ref <117)

## 2011-01-16 ENCOUNTER — Encounter: Payer: Self-pay | Admitting: Family Medicine

## 2011-01-16 ENCOUNTER — Ambulatory Visit (INDEPENDENT_AMBULATORY_CARE_PROVIDER_SITE_OTHER): Payer: PRIVATE HEALTH INSURANCE | Admitting: Family Medicine

## 2011-01-16 VITALS — BP 110/82 | HR 67 | Resp 16 | Ht 61.5 in | Wt 207.1 lb

## 2011-01-16 DIAGNOSIS — E119 Type 2 diabetes mellitus without complications: Secondary | ICD-10-CM

## 2011-01-16 DIAGNOSIS — I10 Essential (primary) hypertension: Secondary | ICD-10-CM

## 2011-01-16 DIAGNOSIS — E669 Obesity, unspecified: Secondary | ICD-10-CM

## 2011-01-16 DIAGNOSIS — E785 Hyperlipidemia, unspecified: Secondary | ICD-10-CM

## 2011-01-16 MED ORDER — METOPROLOL SUCCINATE ER 25 MG PO TB24: 25.0000 mg | ORAL_TABLET | Freq: Every day | ORAL | Status: DC

## 2011-01-16 MED ORDER — TERBINAFINE HCL 250 MG PO TABS: 250.0000 mg | ORAL_TABLET | Freq: Every day | ORAL | Status: DC

## 2011-01-16 MED ORDER — IBUPROFEN 600 MG PO TABS: 600.0000 mg | ORAL_TABLET | Freq: Three times a day (TID) | ORAL | Status: DC | PRN

## 2011-01-16 NOTE — Patient Instructions (Signed)
F/u in 3 months   pls schedule your eye exam if due   Increase the metformin to twice daily as staed on the bottle.  It is important that you exercise regularly at least 30 minutes 5 times a week. If you develop chest pain, have severe difficulty breathing, or feel very tired, stop exercising immediately and seek medical attention    congrats on weight loss, keep it up!!!  A healthy diet is rich in fruit, vegetables and whole grains. Poultry fish, nuts and beans are a healthy choice for protein rather then red meat. A low sodium diet and drinking 64 ounces of water daily is generally recommended. Oils and sweet should be limited. Carbohydrates especially for those who are diabetic or overweight, should be limited to 04-45 gram per meal. It is important to eat on a regular schedule, at least 3 times daily. Snacks should be primarily fruits, vegetables or nuts.  Fasting labs before next visit LABWORK  NEEDS TO BE DONE BETWEEN 3 TO 7 DAYS BEFORE YOUR NEXT SCEDULED  VISIT.  THIS WILL IMPROVE THE QUALITY OF YOUR CARE.

## 2011-01-17 LAB — MICROALBUMIN / CREATININE URINE RATIO
Creatinine, Urine: 264.3 mg/dL
Microalb Creat Ratio: 3.5 mg/g (ref 0.0–30.0)
Microalb, Ur: 0.93 mg/dL (ref 0.00–1.89)

## 2011-01-26 NOTE — Assessment & Plan Note (Signed)
Less well controlled, inc metformin to twice daily, and lower carb  intake

## 2011-01-26 NOTE — Progress Notes (Signed)
  Subjective:    Patient ID: Samantha Vega, female    DOB: 03-29-1951, 60 y.o.   MRN: [id number]  HPI The PT is here for follow up and re-evaluation of chronic medical conditions, medication management and review of any available recent lab and radiology data.  Preventive health is updated, specifically  Cancer screening and Immunization.   Questions or concerns regarding consultations or procedures which the PT has had in the interim are  addressed. The PT denies any adverse reactions to current medications since the last visit.  There are no new concerns.  There are no specific complaints  Tests daily and reports fasting sugars range between 90 to 120. Denies hypoglycemic episodes, polyuria or polydipsia.      Review of Systems Denies recent fever or chills. Denies sinus pressure, nasal congestion, ear pain or sore throat. Denies chest congestion, productive cough or wheezing. Denies chest pains, palpitations and leg swelling Denies abdominal pain, nausea, vomiting,diarrhea or constipation.   Denies dysuria, frequency, hesitancy or incontinence. Denies joint pain, swelling and limitation in mobility. Denies headaches, seizures, numbness, or tingling. Denies depression, anxiety or insomnia. C/o fungal toenail infection and thickened skin on the soles of the feet       Objective:   Physical Exam Patient alert and oriented and in no cardiopulmonary distress.  HEENT: No facial asymmetry, EOMI, no sinus tenderness,  oropharynx pink and moist.  Neck supple no adenopathy.  Chest: Clear to auscultation bilaterally.  CVS: S1, S2 no murmurs, no S3.  ABD: Soft non tender. Bowel sounds normal.  Ext: No edema  MS: Adequate ROM spine, shoulders, hips and knees.  Skin: Intact, no ulcerations or rash noted.  Psych: Good eye contact, normal affect. Memory intact not anxious or depressed appearing.  CNS: CN 2-12 intact, power, tone and sensation normal throughout. Diabetic Foot  Check:  Appearance -  calluses Skin - tinea pedis and onychomycosis Monofilament testing -  Right - Great toe, medial, central, lateral ball and posterior foot intact Left - Great toe, medial, central, lateral ball and posterior foot intact Pulses Left - Dorsalis Pedis and Posterior Tibia normal Right - Dorsalis Pedis and Posterior Tibia normal       Assessment & Plan:

## 2011-01-26 NOTE — Assessment & Plan Note (Signed)
Uncontrolled, low fat diet discussed, no med change

## 2011-01-26 NOTE — Assessment & Plan Note (Signed)
Controlled, no change in medication  

## 2011-01-26 NOTE — Assessment & Plan Note (Signed)
Improved. Pt applauded on succesful weight loss through lifestyle change, and encouraged to continue same. Weight loss goal set for the next several months.  

## 2011-03-08 ENCOUNTER — Other Ambulatory Visit: Payer: Self-pay | Admitting: Family Medicine

## 2011-03-10 LAB — CREATININE, SERUM: GFR calc Af Amer: >60

## 2011-03-16 ENCOUNTER — Other Ambulatory Visit: Payer: Self-pay | Admitting: *Deleted

## 2011-03-16 MED ORDER — ROSUVASTATIN CALCIUM 20 MG PO TABS: 20.0000 mg | ORAL_TABLET | Freq: Every day | ORAL | Status: DC

## 2011-03-16 MED ORDER — METFORMIN HCL 500 MG PO TABS: 500.0000 mg | ORAL_TABLET | Freq: Two times a day (BID) | ORAL | Status: DC

## 2011-04-16 LAB — COMPLETE METABOLIC PANEL WITH GFR
ALT: 16 U/L (ref 0–35)
AST: 18 U/L (ref 0–37)
Creat: 0.67 mg/dL (ref 0.50–1.10)
Sodium: 139 mEq/L (ref 135–145)
Total Bilirubin: 0.3 mg/dL (ref 0.3–1.2)

## 2011-04-16 LAB — HEMOGLOBIN A1C: Mean Plasma Glucose: 120 mg/dL — ABNORMAL HIGH (ref <117)

## 2011-04-18 ENCOUNTER — Encounter: Payer: Self-pay | Admitting: Family Medicine

## 2011-04-18 ENCOUNTER — Other Ambulatory Visit: Payer: Self-pay | Admitting: Family Medicine

## 2011-04-22 ENCOUNTER — Encounter: Payer: Self-pay | Admitting: Family Medicine

## 2011-04-22 ENCOUNTER — Ambulatory Visit (INDEPENDENT_AMBULATORY_CARE_PROVIDER_SITE_OTHER): Payer: PRIVATE HEALTH INSURANCE | Admitting: Family Medicine

## 2011-04-22 VITALS — BP 120/80 | HR 95 | Resp 16 | Ht 61.5 in | Wt 199.0 lb

## 2011-04-22 DIAGNOSIS — Z23 Encounter for immunization: Secondary | ICD-10-CM

## 2011-04-22 DIAGNOSIS — E119 Type 2 diabetes mellitus without complications: Secondary | ICD-10-CM

## 2011-04-22 DIAGNOSIS — I10 Essential (primary) hypertension: Secondary | ICD-10-CM

## 2011-04-22 DIAGNOSIS — E785 Hyperlipidemia, unspecified: Secondary | ICD-10-CM

## 2011-04-22 DIAGNOSIS — E669 Obesity, unspecified: Secondary | ICD-10-CM

## 2011-04-22 DIAGNOSIS — R002 Palpitations: Secondary | ICD-10-CM | POA: Insufficient documentation

## 2011-04-22 DIAGNOSIS — M62838 Other muscle spasm: Secondary | ICD-10-CM

## 2011-04-22 MED ORDER — BENAZEPRIL-HYDROCHLOROTHIAZIDE 20-12.5 MG PO TABS: ORAL_TABLET | ORAL | Status: DC

## 2011-04-22 MED ORDER — DIAZEPAM 5 MG PO TABS: ORAL_TABLET | ORAL | Status: DC

## 2011-04-22 MED ORDER — IBUPROFEN 600 MG PO TABS: 600.0000 mg | ORAL_TABLET | Freq: Three times a day (TID) | ORAL | Status: DC | PRN

## 2011-04-22 NOTE — Patient Instructions (Signed)
F/u in 3.5 months.  You are referred to cardiology re new exertional fatigue and palpitations.  You have a muscle relaxant sent to the pharmacy for left neck spasm.  Recent labs are excellent , no medication changes at this time   I hope yopu feel better soon.  Flu vaccine and TdAP today  A healthy diet is rich in fruit, vegetables and whole grains. Poultry fish, nuts and beans are a healthy choice for protein rather then red meat. A low sodium diet and drinking 64 ounces of water daily is generally recommended. Oils and sweet should be limited. Carbohydrates especially for those who are diabetic or overweight, should be limited to 3-45 gram per meal. It is important to eat on a regular schedule, at least 3 times daily. Snacks should be primarily fruits, vegetables or nuts.  Weight loss goal of 3 to 4 pounds per month

## 2011-04-22 NOTE — Progress Notes (Signed)
  Subjective:    Patient ID: Samantha Vega, female    DOB: Aug 17, 1950, 60 y.o.   MRN: [id number]  HPI 1 month h/o increased exertional fatigue, with palpitations, has to sit an rest often , and feels light headed at times, Multiple CV risk factors 2 day h/o neck pain and spasm, no aggravating factor, woke with the symptom   Review of Systems See HPI Denies recent fever or chills. Denies sinus pressure, nasal congestion, ear pain or sore throat. Denies chest congestion, productive cough or wheezing. Denies chest pains, palpitations and leg swelling Denies abdominal pain, nausea, vomiting,diarrhea or constipation.   Denies dysuria, frequency, hesitancy or incontinence. Denies headaches, seizures, numbness, or tingling. Denies depression, anxiety or insomnia. Denies skin break down or rash. Denies polyuria, polydipsia or blurred vision        Objective:   Physical Exam  Patient alert and oriented and in no cardiopulmonary distress.  HEENT: No facial asymmetry, EOMI, no sinus tenderness,  oropharynx pink and moist.  Neck decreased ROM with trapezius spasm no adenopathy.  Chest: Clear to auscultation bilaterally.  CVS: S1, S2 no murmurs, no S3.  ABD: Soft non tender. Bowel sounds normal.  Ext: No edema  MS: Adequate ROM spine, shoulders, hips and knees.  Skin: Intact, no ulcerations or rash noted.  Psych: Good eye contact, normal affect. Memory intact not anxious or depressed appearing.  CNS: CN 2-12 intact, power, tone and sensation normal throughout.       Assessment & Plan:

## 2011-04-23 ENCOUNTER — Ambulatory Visit (INDEPENDENT_AMBULATORY_CARE_PROVIDER_SITE_OTHER): Payer: PRIVATE HEALTH INSURANCE | Admitting: Cardiology

## 2011-04-23 ENCOUNTER — Encounter: Payer: Self-pay | Admitting: Cardiology

## 2011-04-23 DIAGNOSIS — E119 Type 2 diabetes mellitus without complications: Secondary | ICD-10-CM

## 2011-04-23 DIAGNOSIS — R7302 Impaired glucose tolerance (oral): Secondary | ICD-10-CM | POA: Insufficient documentation

## 2011-04-23 DIAGNOSIS — R002 Palpitations: Secondary | ICD-10-CM

## 2011-04-23 DIAGNOSIS — R5381 Other malaise: Secondary | ICD-10-CM

## 2011-04-23 DIAGNOSIS — R5383 Other fatigue: Secondary | ICD-10-CM

## 2011-04-23 DIAGNOSIS — I4510 Unspecified right bundle-branch block: Secondary | ICD-10-CM

## 2011-04-23 DIAGNOSIS — I10 Essential (primary) hypertension: Secondary | ICD-10-CM

## 2011-04-23 DIAGNOSIS — E669 Obesity, unspecified: Secondary | ICD-10-CM

## 2011-04-23 DIAGNOSIS — R079 Chest pain, unspecified: Secondary | ICD-10-CM | POA: Insufficient documentation

## 2011-04-23 MED ORDER — BENAZEPRIL-HYDROCHLOROTHIAZIDE 20-12.5 MG PO TABS: ORAL_TABLET | ORAL | Status: DC

## 2011-04-23 MED ORDER — METOPROLOL SUCCINATE ER 25 MG PO TB24: 25.0000 mg | ORAL_TABLET | Freq: Two times a day (BID) | ORAL | Status: DC

## 2011-04-23 NOTE — Assessment & Plan Note (Signed)
Patient is advised to lose weight and follow low-fat diet

## 2011-04-23 NOTE — Progress Notes (Signed)
HPI:  This is a 60 year old African American female patient referred to [place] by Dr. Lodema Hong for further evaluation of palpitations and early fatigue. She was seen by Dr. Dietrich Pates in 2008 for similar symptoms. An event recorder showed a few PVCs and she was treated with metoprolol.  The patient now complains of becoming extremely fatigued at the end of the day and her heart begins to jump and race. She will lay down in the bracing will go away but then she develops a sharp shooting pain in her chest. This is occurring daily and has become more bothersome. 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 She denies any exertional chest tightness, dyspnea, dyspnea on exertion, dizziness, or presyncope. She says she does feel tired and can fall asleep all day long.  Allergies  Allergen Reactions  . Ace Inhibitors     REACTION: cough    Current Outpatient Prescriptions on File Prior to Visit  Medication Sig Dispense Refill  . aspirin (ANACIN) 81 MG EC tablet Take 81 mg by mouth daily.        . benazepril-hydrochlorthiazide (LOTENSIN HCT) 20-12.5 MG per tablet TAKE 1 TABLET BY MOUTH DAILY.  90 tablet  1  . calcium-vitamin D (OSCAL 500/200 D-3) 500 MG tablet Take 1 tablet by mouth 2 (two) times daily.        . diazepam (VALIUM) 5 MG tablet 1 tablet at bedtime as needed for spasm, use for 1 week , then as needed only  20 tablet  0  . gentamicin (GENTASOL) 0.3 % ophthalmic solution 1 drop 3 (three) times daily. One drop in left eye 3 times daily for 5 days       . glucose blood (ACCU-CHEK AVIVA) test strip 1 each by Other route daily. Use as instructed       . ibuprofen (ADVIL,MOTRIN) 600 MG tablet Take 1 tablet (600 mg total) by mouth 3 (three) times daily as needed for pain.  90 tablet  1  . Lancets (ACCU-CHEK MULTICLIX) lancets 1 each by Other route as needed. Use as instructed       . metFORMIN (GLUCOPHAGE) 500 MG tablet Take 1 tablet (500 mg total) by  mouth 2 (two) times daily with a meal.  180 tablet  1  . metoprolol succinate (TOPROL-XL) 25 MG 24 hr tablet Take 1 tablet (25 mg total) by mouth daily.  60 tablet  2  . Multiple Vitamin (MULTIVITAMINS PO) Take by mouth daily.        . rosuvastatin (CRESTOR) 20 MG tablet Take 1 tablet (20 mg total) by mouth at bedtime.  90 tablet  1  . terbinafine (LAMISIL) 250 MG tablet TAKE 1 TABLET EVERY DAY  30 tablet  0    Past Medical History  Diagnosis Date  . RBBB (right bundle branch block)     +Palpitations.   EKG 11/2006:  NSR, RBBB, LAFB, markedly delayed R-wave progression, no change; Echocardiogram in 10/2006-suboptimal quality, normal; Event recorder-PVCs, no significant arrhythmias or symptoms ;  . Obesity   . Hyperlipidemia   . Hypertension   . Diabetes mellitus   . Chest pain     Noncardiac; evaluated and treated in 2008    Past Surgical History  Procedure Date  . Total abdominal hysterectomy w/ bilateral salpingoophorectomy   . Shoulder surgery 2006    Left-post-trauma    Family History  Problem Relation Age of Onset  .  Hypertension Mother     and sister  . Arthritis Mother   . Stroke Father   . Hypertension Father   . Diabetes Sister     x1  . Sarcoidosis Sister     x1  . Heart failure Brother   . Coronary artery disease Father     History   Social History  . Marital Status: Married    Spouse Name: N/A    Number of Children: 0  . Years of Education: N/A   Occupational History  . Restaurant worker [employer]   Social History Main Topics  . Smoking status: Never Smoker   . Smokeless tobacco: Never Used  . Alcohol Use: No  . Drug Use: No  . Sexually Active: Not on file   Other Topics Concern  . Not on file   Social History Narrative  . No narrative on file    ROS: See HPI Eyes: Negative Ears:Negative for tinnitus Cardiovascular: Negative for  dyspnea, dyspnea on exertion, near-syncope, orthopnea, paroxysmal nocturnal dyspnia and syncope,edema,  claudication, cyanosis,.  Respiratory:   Negative for cough, hemoptysis, shortness of breath, sleep disturbances due to breathing, sputum production and wheezing.   Endocrine: Negative for cold intolerance and heat intolerance.  Hematologic/Lymphatic: Negative for adenopathy and bleeding problem. Does not bruise/bleed easily.  Musculoskeletal: Negative.   Gastrointestinal: Negative for nausea, vomiting, reflux, abdominal pain, diarrhea, constipation.   Neurological: Negative.  Allergic/Immunologic: Negative for environmental allergies.   PHYSICAL EXAM: Well-nournished, in no acute distress. Neck: No JVD, HJR, Bruit, or thyroid enlargement Lungs: No tachypnea, clear without wheezing, rales, or rhonchi Cardiovascular: RRR, PMI not displaced, 2/6 systolic murmur at the left sternal border, no gallops, bruit, thrill, or heave. Abdomen: BS normal. Soft without organomegaly, masses, lesions or tenderness. Extremities: without cyanosis, clubbing or edema. Good distal pulses bilateral SKin: Warm, no lesions or rashes  Musculoskeletal: No deformities Neuro: no focal signs  BP 104/69  Pulse 102  Ht 5\' 1"  (1.549 m)  Wt 200 lb (90.719 kg)  BMI 37.79 kg/m2  SpO2 96%  YNW:GNFAOZ sinus rhythm with right bundle branch block no acute change

## 2011-04-23 NOTE — Assessment & Plan Note (Addendum)
Patient has fatigue with very little activity. This is followed by palpitations. She does continue to work at [employer] usually in four-hour shifts. We will order a CBC to rule out anemia. Patient could also have sleep apnea.

## 2011-04-23 NOTE — Assessment & Plan Note (Addendum)
Patient has increased palpitations when she becomes easily fatigued. She can lay down and the racing will subside. This is increased in frequency and duration. We will check an event recorder as well as a TSH and CBC. Her last TSH was in May 2011 at which time was normal. We will increase her metoprolol to 25 mg b.i.d. Because her blood pressure is low we will decrease her Lotensin to 1/2 daily.

## 2011-04-23 NOTE — Patient Instructions (Addendum)
**Note De-Identified Samantha Vega Obfuscation** Your physician has recommended you make the following change in your medication: increase Metoprolol to 25 mg twice daily and decrease Lotensin to 1/2 tablet daily  Your physician has recommended that you wear an event monitor. Event monitors are medical devices that record the heart's electrical activity. Doctors most often [place] these monitors to diagnose arrhythmias. Arrhythmias are problems with the speed or rhythm of the heartbeat. The monitor is a small, portable device. You can wear one while you do your normal daily activities. This is usually used to diagnose what is causing palpitations/syncope (passing out).  Your physician recommends that you continue on your current medications as directed. Please refer to the Current Medication list given to you today.  Your physician recommends that you return for lab work in: today  Your physician recommends that you schedule a follow-up appointment in: 1 month

## 2011-04-23 NOTE — Assessment & Plan Note (Addendum)
Patient's blood pressures on the low side so we we'll decrease her Lotensin.

## 2011-04-24 ENCOUNTER — Ambulatory Visit: Payer: PRIVATE HEALTH INSURANCE | Admitting: Cardiology

## 2011-04-24 ENCOUNTER — Telehealth: Payer: Self-pay | Admitting: *Deleted

## 2011-04-24 ENCOUNTER — Telehealth: Payer: Self-pay | Admitting: Cardiology

## 2011-04-24 ENCOUNTER — Encounter: Payer: Self-pay | Admitting: *Deleted

## 2011-04-24 ENCOUNTER — Encounter: Payer: Self-pay | Admitting: Cardiology

## 2011-04-24 LAB — CBC WITH DIFFERENTIAL/PLATELET
Basophils Absolute: 0 10*3/uL (ref 0.0–0.1)
Eosinophils Absolute: 0.1 10*3/uL (ref 0.0–0.7)
Eosinophils Relative: 2 % (ref 0–5)
HCT: 38.1 % (ref 36.0–46.0)
Lymphocytes Relative: 30 % (ref 12–46)
Lymphs Abs: 1.9 10*3/uL (ref 0.7–4.0)
MCH: 27.9 pg (ref 26.0–34.0)
MCV: 87.2 fL (ref 78.0–100.0)
Monocytes Absolute: 0.5 10*3/uL (ref 0.1–1.0)
Platelets: 253 10*3/uL (ref 150–400)
RDW: 14.9 % (ref 11.5–15.5)
WBC: 6.4 10*3/uL (ref 4.0–10.5)

## 2011-04-24 NOTE — Telephone Encounter (Signed)
Results called to patient.

## 2011-04-24 NOTE — Telephone Encounter (Signed)
Patient states that she needs letter stating that she can go back to work tomorrow. / tg

## 2011-05-01 ENCOUNTER — Encounter: Payer: Self-pay | Admitting: Family Medicine

## 2011-05-02 DIAGNOSIS — R002 Palpitations: Secondary | ICD-10-CM

## 2011-05-06 NOTE — Assessment & Plan Note (Signed)
Hyperlipidemia:Low fat diet discussed and encouraged.  Needs updated profile

## 2011-05-06 NOTE — Assessment & Plan Note (Signed)
Recent increase in palpitations with fatigue will have cardiology re evaluate

## 2011-05-06 NOTE — Assessment & Plan Note (Signed)
Controlled, no change in medication, no med change  

## 2011-05-06 NOTE — Assessment & Plan Note (Signed)
Unchanged. Patient re-educated about  the importance of commitment to a  minimum of 150 minutes of exercise per week. The importance of healthy food choices with portion control discussed. Encouraged to start a food diary, count calories and to consider  joining a support group. Sample diet sheets offered. Goals set by the patient for the next several months.    

## 2011-05-06 NOTE — Assessment & Plan Note (Signed)
Controlled, no change in medication  

## 2011-05-06 NOTE — Assessment & Plan Note (Signed)
Acute neck spasm, muscle relaxant prescribed

## 2011-05-14 ENCOUNTER — Other Ambulatory Visit: Payer: Self-pay

## 2011-05-14 MED ORDER — METFORMIN HCL 500 MG PO TABS: 500.0000 mg | ORAL_TABLET | Freq: Two times a day (BID) | ORAL | Status: DC

## 2011-05-16 ENCOUNTER — Other Ambulatory Visit: Payer: Self-pay

## 2011-05-16 MED ORDER — BENAZEPRIL-HYDROCHLOROTHIAZIDE 20-12.5 MG PO TABS: ORAL_TABLET | ORAL | Status: DC

## 2011-05-22 ENCOUNTER — Encounter: Payer: Self-pay | Admitting: Cardiology

## 2011-05-27 ENCOUNTER — Other Ambulatory Visit: Payer: Self-pay | Admitting: Family Medicine

## 2011-06-03 ENCOUNTER — Ambulatory Visit (INDEPENDENT_AMBULATORY_CARE_PROVIDER_SITE_OTHER): Payer: PRIVATE HEALTH INSURANCE | Admitting: Cardiology

## 2011-06-03 ENCOUNTER — Encounter: Payer: Self-pay | Admitting: *Deleted

## 2011-06-03 ENCOUNTER — Encounter: Payer: Self-pay | Admitting: Cardiology

## 2011-06-03 DIAGNOSIS — R002 Palpitations: Secondary | ICD-10-CM

## 2011-06-03 DIAGNOSIS — I10 Essential (primary) hypertension: Secondary | ICD-10-CM

## 2011-06-03 DIAGNOSIS — R079 Chest pain, unspecified: Secondary | ICD-10-CM

## 2011-06-03 DIAGNOSIS — H547 Unspecified visual loss: Secondary | ICD-10-CM

## 2011-06-03 DIAGNOSIS — E119 Type 2 diabetes mellitus without complications: Secondary | ICD-10-CM

## 2011-06-03 DIAGNOSIS — E785 Hyperlipidemia, unspecified: Secondary | ICD-10-CM

## 2011-06-03 NOTE — Assessment & Plan Note (Signed)
Patient reports generally good CBG values; Hemoglobin A1c in 03/2011 was 5.8

## 2011-06-03 NOTE — Assessment & Plan Note (Addendum)
In the setting of diabetes, LDL cholesterol less than 161 is considered optimal.  Lipid level should be reassessed at patient's next visit with Dr. Lodema Hong.  Anemia and hyperthyroidism have been excluded as possible causes for palpitations and/or fatigue.

## 2011-06-03 NOTE — Patient Instructions (Signed)
**Note De-identified Samantha Vega Obfuscation** Your physician recommends that you continue on your current medications as directed. Please refer to the Current Medication list given to you today.  Your physician recommends that you schedule a follow-up appointment in: as needed  

## 2011-06-03 NOTE — Assessment & Plan Note (Addendum)
Blood pressure control is excellent with current therapy, which will be continued.  There appears to be no need for continuing cardiology care.  I will be happy to reevaluate this nice woman at any time Dr. Lodema Hong deems appropriate.

## 2011-06-03 NOTE — Progress Notes (Signed)
Patient ID: Samantha Vega, female   DOB: 24-Dec-1950, 60 y.o.   MRN: [id number] HPI: Return visit for this very nice woman with palpitations and fatigue.  Since her last visit, there has been spontaneous improvement in her symptoms.  She has had very brief episodes of chest discomfort lasting seconds.  Palpitations have virtually resolved.  She continues to have mild fatigue, but is working less and notices that she feels better.  She reports minor daytime somnolence, but has never been told that she snores.  An event recorder revealed no significant arrhythmias, and no symptoms were reported during the 3 weeks of monitoring.  Prior to Admission medications   Medication Sig Start Date End Date Taking? Authorizing Provider  aspirin (ANACIN) 81 MG EC tablet Take 81 mg by mouth daily.     Yes Historical Provider, MD  benazepril-hydrochlorthiazide (LOTENSIN HCT) 20-12.5 MG per tablet TAKE 1TABLET BY MOUTH DAILY. 05/16/11  Yes Syliva Overman, MD  calcium-vitamin D (OSCAL 500/200 D-3) 500 MG tablet Take 1 tablet by mouth 2 (two) times daily.     Yes Historical Provider, MD  diazepam (VALIUM) 5 MG tablet 1 tablet at bedtime as needed for spasm, use for 1 week , then as needed only 04/22/11  Yes Syliva Overman, MD  gentamicin (GENTASOL) 0.3 % ophthalmic solution 1 drop 3 (three) times daily. One drop in left eye 3 times daily for 5 days    Yes Historical Provider, MD  glucose blood (ACCU-CHEK AVIVA) test strip 1 each by Other route daily. Use as instructed    Yes Historical Provider, MD  ibuprofen (ADVIL,MOTRIN) 600 MG tablet Take 1 tablet (600 mg total) by mouth 3 (three) times daily as needed for pain. 04/22/11  Yes Syliva Overman, MD  Lancets (ACCU-CHEK MULTICLIX) lancets 1 each by Other route as needed. Use as instructed    Yes Historical Provider, MD  metFORMIN (GLUCOPHAGE) 500 MG tablet Take 1 tablet (500 mg total) by mouth 2 (two) times daily with a meal. 05/14/11  Yes Syliva Overman, MD    metoprolol succinate (TOPROL-XL) 25 MG 24 hr tablet Take 1 tablet (25 mg total) by mouth 2 (two) times daily. Dose increase 04/23/11  Yes Gerrit Friends. Natesha Hassey, MD  Multiple Vitamin (MULTIVITAMINS PO) Take by mouth daily.     Yes Historical Provider, MD  rosuvastatin (CRESTOR) 20 MG tablet Take 1 tablet (20 mg total) by mouth at bedtime. 03/16/11  Yes Syliva Overman, MD  terbinafine (LAMISIL) 250 MG tablet TAKE 1 TABLET EVERY DAY 05/27/11  Yes Syliva Overman, MD    Allergies  Allergen Reactions  . Ace Inhibitors     REACTION: cough  Past medical history, social history, and family history reviewed and updated.  ROS: Denies orthopnea, PND, lightheadedness or syncope.  PHYSICAL EXAM: BP 125/78  Pulse 95  Ht 5\' 1"  (1.549 m)  Wt 90.901 kg (200 lb 6.4 oz)  BMI 37.87 kg/m2  General-Well developed; no acute distress Body habitus-obese Neck-No JVD; no carotid bruits Lungs-clear lung fields; resonant to percussion Cardiovascular-normal PMI; normal S1; Increased intensity of S2; minimal basilar systolic murmur Abdomen-normal bowel sounds; soft and non-tender without masses or organomegaly Musculoskeletal-No deformities, no cyanosis or clubbing Neurologic-Normal cranial nerves; symmetric strength and tone Skin-Warm, no significant lesions; scar over the right face Extremities-distal pulses intact; no edema  ASSESSMENT AND PLAN:  Olympia Bing, MD 06/03/2011 2:25 PM

## 2011-06-03 NOTE — Assessment & Plan Note (Signed)
Chest discomfort remains extremely atypical and almost certainly noncardiac in origin.  No further testing or treatment is necessary unless symptoms increase in duration, intensity and/or frequency.

## 2011-06-03 NOTE — Assessment & Plan Note (Signed)
Symptoms have virtually resolved spontaneously.  Event recording indicates no significant arrhythmias.

## 2011-06-04 ENCOUNTER — Encounter: Payer: Self-pay | Admitting: Cardiology

## 2011-06-04 ENCOUNTER — Other Ambulatory Visit: Payer: Self-pay

## 2011-06-04 MED ORDER — ROSUVASTATIN CALCIUM 20 MG PO TABS: 20.0000 mg | ORAL_TABLET | Freq: Every day | ORAL | Status: DC

## 2011-06-11 ENCOUNTER — Other Ambulatory Visit: Payer: Self-pay | Admitting: Cardiology

## 2011-08-13 ENCOUNTER — Encounter: Payer: Self-pay | Admitting: Family Medicine

## 2011-08-13 ENCOUNTER — Other Ambulatory Visit: Payer: Self-pay | Admitting: Family Medicine

## 2011-08-13 ENCOUNTER — Other Ambulatory Visit: Payer: Self-pay

## 2011-08-13 ENCOUNTER — Ambulatory Visit (INDEPENDENT_AMBULATORY_CARE_PROVIDER_SITE_OTHER): Payer: PRIVATE HEALTH INSURANCE | Admitting: Family Medicine

## 2011-08-13 VITALS — BP 120/70 | HR 88 | Resp 15 | Ht 61.5 in | Wt 210.0 lb

## 2011-08-13 DIAGNOSIS — I10 Essential (primary) hypertension: Secondary | ICD-10-CM

## 2011-08-13 DIAGNOSIS — Z139 Encounter for screening, unspecified: Secondary | ICD-10-CM

## 2011-08-13 DIAGNOSIS — E785 Hyperlipidemia, unspecified: Secondary | ICD-10-CM

## 2011-08-13 DIAGNOSIS — E119 Type 2 diabetes mellitus without complications: Secondary | ICD-10-CM

## 2011-08-13 DIAGNOSIS — E669 Obesity, unspecified: Secondary | ICD-10-CM

## 2011-08-13 DIAGNOSIS — Z13820 Encounter for screening for osteoporosis: Secondary | ICD-10-CM

## 2011-08-13 MED ORDER — GLUCOSE BLOOD VI STRP: ORAL_STRIP | Status: DC

## 2011-08-13 MED ORDER — IBUPROFEN 600 MG PO TABS: 600.0000 mg | ORAL_TABLET | Freq: Three times a day (TID) | ORAL | Status: DC | PRN

## 2011-08-13 MED ORDER — METOPROLOL SUCCINATE ER 25 MG PO TB24: 25.0000 mg | ORAL_TABLET | Freq: Two times a day (BID) | ORAL | Status: DC

## 2011-08-13 MED ORDER — BENAZEPRIL-HYDROCHLOROTHIAZIDE 20-12.5 MG PO TABS: ORAL_TABLET | ORAL | Status: DC

## 2011-08-13 MED ORDER — ROSUVASTATIN CALCIUM 20 MG PO TABS: 20.0000 mg | ORAL_TABLET | Freq: Every day | ORAL | Status: DC

## 2011-08-13 MED ORDER — METFORMIN HCL 500 MG PO TABS: 500.0000 mg | ORAL_TABLET | Freq: Two times a day (BID) | ORAL | Status: DC

## 2011-08-13 NOTE — Patient Instructions (Signed)
F/u in 4 month  Fasting lipid, cmp, HBa1C , Vit D in the morning  No med changes   It is important that you exercise regularly at least 30 minutes 5 times a week. If you develop chest pain, have severe difficulty breathing, or feel very tired, stop exercising immediately and seek medical attention    A healthy diet is rich in fruit, vegetables and whole grains. Poultry fish, nuts and beans are a healthy choice for protein rather then red meat. A low sodium diet and drinking 64 ounces of water daily is generally recommended. Oils and sweet should be limited. Carbohydrates especially for those who are diabetic or overweight, should be limited to 04-45 gram per meal. It is important to eat on a regular schedule, at least 3 times daily. Snacks should be primarily fruits, vegetables or nuts.  Weight loss goal of 2.5 pounds per month  You are referred for eye exam, bone density test and mammogram

## 2011-08-14 LAB — LIPID PANEL
HDL: 40 mg/dL (ref >39)
LDL Cholesterol: 120 mg/dL — ABNORMAL HIGH (ref 0–99)
Total CHOL/HDL Ratio: 4.7 Ratio
VLDL: 27 mg/dL (ref 0–40)

## 2011-08-14 LAB — COMPLETE METABOLIC PANEL WITH GFR
ALT: 14 U/L (ref 0–35)
AST: 15 U/L (ref 0–37)
Alkaline Phosphatase: 86 U/L (ref 39–117)
CO2: 28 mEq/L (ref 19–32)
Sodium: 140 mEq/L (ref 135–145)
Total Bilirubin: 0.3 mg/dL (ref 0.3–1.2)
Total Protein: 7 g/dL (ref 6.0–8.3)

## 2011-08-15 ENCOUNTER — Ambulatory Visit (HOSPITAL_COMMUNITY)
Admission: RE | Admit: 2011-08-15 | Discharge: 2011-08-15 | Disposition: A | Discharge status: Home / Self Care | Payer: PRIVATE HEALTH INSURANCE | Source: Ambulatory Visit | Attending: Family Medicine | Admitting: Family Medicine

## 2011-08-15 DIAGNOSIS — Z139 Encounter for screening, unspecified: Secondary | ICD-10-CM

## 2011-08-15 DIAGNOSIS — Z1231 Encounter for screening mammogram for malignant neoplasm of breast: Secondary | ICD-10-CM | POA: Insufficient documentation

## 2011-08-15 LAB — VITAMIN D 25 HYDROXY (VIT D DEFICIENCY, FRACTURES): Vit D, 25-Hydroxy: 36 ng/mL (ref 30–89)

## 2011-08-24 NOTE — Assessment & Plan Note (Signed)
Controlled, no change in medication  

## 2011-08-24 NOTE — Progress Notes (Signed)
  Subjective:    Patient ID: Samantha Vega, female    DOB: September 21, 1950, 61 y.o.   MRN: [id number]  HPI The PT is here for follow up and re-evaluation of chronic medical conditions, medication management and review of any available recent lab and radiology data.  Preventive health is updated, specifically  Cancer screening and Immunization.   Questions or concerns regarding consultations or procedures which the PT has had in the interim are  addressed. The PT denies any adverse reactions to current medications since the last visit.  There are no new concerns.  There are no specific complaints . She has gained weight, less active since she has retired, and overeating. Denies polyuria, polydipsia or blurred vision, fasting sugars often under 110      Review of Systems    See HPI Denies recent fever or chills. Denies sinus pressure, nasal congestion, ear pain or sore throat. Denies chest congestion, productive cough or wheezing. Denies chest pains, palpitations and leg swelling Denies abdominal pain, nausea, vomiting,diarrhea or constipation.   Denies dysuria, frequency, hesitancy or incontinence. Denies joint pain, swelling and limitation in mobility. Denies headaches, seizures, numbness, or tingling. Denies depression, anxiety or insomnia. Denies skin break down or rash.     Objective:   Physical Exam  Patient alert and oriented and in no cardiopulmonary distress.  HEENT: No facial asymmetry, EOMI, no sinus tenderness,  oropharynx pink and moist.  Neck supple no adenopathy.  Chest: Clear to auscultation bilaterally.  CVS: S1, S2 no murmurs, no S3.  ABD: Soft non tender. Bowel sounds normal.  Ext: No edema  MS: Adequate ROM spine, shoulders, hips and knees.  Skin: Intact, no ulcerations or rash noted.  Psych: Good eye contact, normal affect. Memory intact not anxious or depressed appearing.  CNS: CN 2-12 intact, power, tone and sensation normal  throughout.       Assessment & Plan:

## 2011-08-24 NOTE — Assessment & Plan Note (Addendum)
Deteriorated. Patient re-educated about  the importance of commitment to a  minimum of 150 minutes of exercise per week. The importance of healthy food choices with portion control discussed. Encouraged to start a food diary, count calories and to consider  joining a support group. Sample diet sheets offered. Goals set by the patient for the next several months.    

## 2011-08-24 NOTE — Assessment & Plan Note (Signed)
Hyperlipidemia:Low fat diet discussed and encouraged.  LDL elevated, pt needs to be more consistent with diet, no med change

## 2011-09-02 ENCOUNTER — Ambulatory Visit (HOSPITAL_COMMUNITY)
Admission: RE | Admit: 2011-09-02 | Discharge: 2011-09-02 | Disposition: A | Discharge status: Home / Self Care | Payer: PRIVATE HEALTH INSURANCE | Source: Ambulatory Visit | Attending: Family Medicine | Admitting: Family Medicine

## 2011-09-02 DIAGNOSIS — Z13820 Encounter for screening for osteoporosis: Secondary | ICD-10-CM | POA: Insufficient documentation

## 2011-09-02 DIAGNOSIS — Z780 Asymptomatic menopausal state: Secondary | ICD-10-CM | POA: Insufficient documentation

## 2011-10-20 ENCOUNTER — Other Ambulatory Visit: Payer: Self-pay | Admitting: Family Medicine

## 2011-12-11 ENCOUNTER — Encounter: Payer: Self-pay | Admitting: Family Medicine

## 2011-12-11 ENCOUNTER — Ambulatory Visit: Payer: PRIVATE HEALTH INSURANCE | Admitting: Family Medicine

## 2011-12-12 ENCOUNTER — Ambulatory Visit (INDEPENDENT_AMBULATORY_CARE_PROVIDER_SITE_OTHER): Payer: PRIVATE HEALTH INSURANCE | Admitting: Family Medicine

## 2011-12-12 ENCOUNTER — Encounter: Payer: Self-pay | Admitting: Family Medicine

## 2011-12-12 ENCOUNTER — Other Ambulatory Visit: Payer: Self-pay

## 2011-12-12 VITALS — BP 124/72 | HR 82 | Resp 18 | Ht 61.5 in | Wt 208.0 lb

## 2011-12-12 DIAGNOSIS — I10 Essential (primary) hypertension: Secondary | ICD-10-CM

## 2011-12-12 DIAGNOSIS — E119 Type 2 diabetes mellitus without complications: Secondary | ICD-10-CM

## 2011-12-12 DIAGNOSIS — E669 Obesity, unspecified: Secondary | ICD-10-CM

## 2011-12-12 DIAGNOSIS — E785 Hyperlipidemia, unspecified: Secondary | ICD-10-CM

## 2011-12-12 MED ORDER — BENAZEPRIL-HYDROCHLOROTHIAZIDE 20-12.5 MG PO TABS: ORAL_TABLET | ORAL | Status: DC

## 2011-12-12 MED ORDER — METOPROLOL SUCCINATE ER 25 MG PO TB24: 25.0000 mg | ORAL_TABLET | Freq: Two times a day (BID) | ORAL | Status: DC

## 2011-12-12 NOTE — Progress Notes (Signed)
  Subjective:    Patient ID: Samantha Vega, female    DOB: [birth year], 61 y.o.   MRN: [id number]  HPI The PT is here for follow up and re-evaluation of chronic medical conditions, medication management and review of any available recent lab and radiology data.  Preventive health is updated, specifically  Cancer screening and Immunization.   Questions or concerns regarding consultations or procedures which the PT has had in the interim are  addressed. The PT denies any adverse reactions to current medications since the last visit.  There are no new concerns.  There are no specific complaints . Fasting sugars are seldom over 110, denies polyuria, polydipsia or blurred vision , still needs to fill script for new lenses, had eye exam in the past year      Review of Systems See HPI Denies recent fever or chills. Denies sinus pressure, nasal congestion, ear pain or sore throat. Denies chest congestion, productive cough or wheezing. Denies chest pains, palpitations and leg swelling Denies abdominal pain, nausea, vomiting,diarrhea or constipation.   Denies dysuria, frequency, hesitancy or incontinence. Denies joint pain, swelling and limitation in mobility. Denies headaches, seizures, numbness, or tingling. Denies depression, anxiety or insomnia. Denies skin break down or rash.        Objective:   Physical Exam Patient alert and oriented and in no cardiopulmonary distress.  HEENT: No facial asymmetry, EOMI, no sinus tenderness,  oropharynx pink and moist.  Neck supple no adenopathy.  Chest: Clear to auscultation bilaterally.  CVS: S1, S2 no murmurs, no S3.  ABD: Soft non tender. Bowel sounds normal.  Ext: No edema  MS: Adequate ROM spine, shoulders, hips and knees.  Skin: Intact, no ulcerations or rash noted.  Psych: Good eye contact, normal affect. Memory intact not anxious or depressed appearing.  CNS: CN 2-12 intact, power, tone and sensation normal  throughout.        Assessment & Plan:

## 2011-12-12 NOTE — Assessment & Plan Note (Signed)
Controlled, no change in medication, no med change  

## 2011-12-12 NOTE — Assessment & Plan Note (Signed)
Deteriorated. Patient re-educated about  the importance of commitment to a  minimum of 150 minutes of exercise per week. The importance of healthy food choices with portion control discussed. Encouraged to start a food diary, count calories and to consider  joining a support group. Sample diet sheets offered. Goals set by the patient for the next several months.    

## 2011-12-12 NOTE — Assessment & Plan Note (Signed)
Uncontrolled, updated labs prior to next visit  Hyperlipidemia:Low fat diet discussed and encouraged.

## 2011-12-12 NOTE — Assessment & Plan Note (Signed)
Controlled, no change in medication  

## 2011-12-12 NOTE — Patient Instructions (Addendum)
cPE in 4 month   Fasting lipid, cm,p and eGFR , HBA1Cand microalb  August 4 or after   It is important that you exercise regularly at least 30 minutes 5 times a week. If you develop chest pain, have severe difficulty breathing, or feel very tired, stop exercising immediately and seek medical attention   A healthy diet is rich in fruit, vegetables and whole grains. Poultry fish, nuts and beans are a healthy choice for protein rather then red meat. A low sodium diet and drinking 64 ounces of water daily is generally recommended. Oils and sweet should be limited. Carbohydrates especially for those who are diabetic or overweight, should be limited to 04-45 gram per meal. It is important to eat on a regular schedule, at least 3 times daily. Snacks should be primarily fruits, vegetables or nuts.  Weight loss goal of 2 pounds per month  Blood pressure is excellent

## 2012-03-20 ENCOUNTER — Other Ambulatory Visit: Payer: Self-pay | Admitting: Family Medicine

## 2012-04-16 ENCOUNTER — Other Ambulatory Visit: Payer: Self-pay | Admitting: Family Medicine

## 2012-04-16 ENCOUNTER — Ambulatory Visit (INDEPENDENT_AMBULATORY_CARE_PROVIDER_SITE_OTHER): Payer: PRIVATE HEALTH INSURANCE | Admitting: Family Medicine

## 2012-04-16 ENCOUNTER — Encounter: Payer: Self-pay | Admitting: Family Medicine

## 2012-04-16 VITALS — BP 128/70 | HR 103 | Resp 18 | Ht 61.5 in | Wt 205.1 lb

## 2012-04-16 DIAGNOSIS — E119 Type 2 diabetes mellitus without complications: Secondary | ICD-10-CM

## 2012-04-16 DIAGNOSIS — M25569 Pain in unspecified knee: Secondary | ICD-10-CM

## 2012-04-16 DIAGNOSIS — M25562 Pain in left knee: Secondary | ICD-10-CM

## 2012-04-16 DIAGNOSIS — M25561 Pain in right knee: Secondary | ICD-10-CM

## 2012-04-16 DIAGNOSIS — Z23 Encounter for immunization: Secondary | ICD-10-CM

## 2012-04-16 DIAGNOSIS — E669 Obesity, unspecified: Secondary | ICD-10-CM

## 2012-04-16 DIAGNOSIS — R002 Palpitations: Secondary | ICD-10-CM

## 2012-04-16 DIAGNOSIS — E785 Hyperlipidemia, unspecified: Secondary | ICD-10-CM

## 2012-04-16 DIAGNOSIS — I10 Essential (primary) hypertension: Secondary | ICD-10-CM

## 2012-04-16 DIAGNOSIS — Z1211 Encounter for screening for malignant neoplasm of colon: Secondary | ICD-10-CM

## 2012-04-16 NOTE — Progress Notes (Signed)
  Subjective:    Patient ID: Samantha Vega, female    DOB: [birth year], 61 y.o.   MRN: [id number]  HPI The PT is here for follow up and re-evaluation of chronic medical conditions, medication management and review of any available recent lab and radiology data.  Preventive health is updated, specifically  Cancer screening and Immunization.   Questions or concerns regarding consultations or procedures which the PT has had in the interim are  addressed. The PT denies any adverse reactions to current medications since the last visit.  There are no new concerns.  C/o increased bilateral kneee pain, left worse than right with buckling. Blood sugars when tested are seldom over 120 fasting.     Review of Systems See HPI Denies recent fever or chills. Denies sinus pressure, nasal congestion, ear pain or sore throat. Denies chest congestion, productive cough or wheezing. Denies chest pains, palpitations and leg swelling Denies abdominal pain, nausea, vomiting,diarrhea or constipation.   Denies dysuria, frequency, hesitancy or incontinence.  Denies headaches, seizures, numbness, or tingling. Denies depression, anxiety or insomnia. Denies skin break down or rash.        Objective:   Physical Exam Patient alert and oriented and in no cardiopulmonary distress.  HEENT: No facial asymmetry, EOMI, no sinus tenderness,  oropharynx pink and moist.  Neck supple no adenopathy.  Chest: Clear to auscultation bilaterally.  CVS: S1, S2 no murmurs, no S3.  ABD: Soft non tender. Bowel sounds normal. Rectal: heme negative stool, no palpable mass Ext: No edema  MS: Adequate ROM spine, shoulders, hips and reduced in  Knees, left greater than right, with crepitus  Skin: Intact, no ulcerations or rash noted.  Psych: Good eye contact, normal affect. Memory intact not anxious or depressed appearing.  CNS: CN 2-12 intact, power, tone and sensation normal throughout. Diabetic Foot Check:    Appearance - no lesions, ulcers or calluses Skin - no unusual pallor or redness. Onychomycosis Sensation - grossly intact to light touch Pulses Left - Dorsalis Pedis and Posterior Tibia normal Right - Dorsalis Pedis and Posterior Tibia normal        Assessment & Plan:

## 2012-04-16 NOTE — Patient Instructions (Addendum)
F/u in 4.5 month, call if you need me before  Fasting lipid, cmp and EGFr, hBA1C, TSh and Vit D andcBc, 11/08 or after   HBA1C in 4.5 month, non fast  Flu vaccine today  It is important that you exercise regularly at least 30 minutes 5 times a week. If you develop chest pain, have severe difficulty breathing, or feel very tired, stop exercising immediately and seek medical attention   A healthy diet is rich in fruit, vegetables and whole grains. Poultry fish, nuts and beans are a healthy choice for protein rather then red meat. A low sodium diet and drinking 64 ounces of water daily is generally recommended. Oils and sweet should be limited. Carbohydrates especially for those who are diabetic or overweight, should be limited to 34-45 gram per meal. It is important to eat on a regular schedule, at least 3 times daily. Snacks should be primarily fruits, vegetables or nuts. Weight loss goal of 5 pounds  You are referred to Dr Romeo Apple re knee pain  Rectal exam today

## 2012-04-17 NOTE — Assessment & Plan Note (Signed)
Improved. Pt applauded on succesful weight loss through lifestyle change, and encouraged to continue same. Weight loss goal set for the next several months.  

## 2012-04-17 NOTE — Assessment & Plan Note (Signed)
Progressive with instability, refer to ortho

## 2012-04-17 NOTE — Assessment & Plan Note (Signed)
Controlled ion medication

## 2012-04-17 NOTE — Assessment & Plan Note (Signed)
Hyperlipidemia:Low fat diet discussed and encouraged.  Updated labs to be obtained

## 2012-04-17 NOTE — Assessment & Plan Note (Signed)
Controlled, no change in medication DASH diet and commitment to daily physical activity for a minimum of 30 minutes discussed and encouraged, as a part of hypertension management. The importance of attaining a healthy weight is also discussed.  

## 2012-04-17 NOTE — Assessment & Plan Note (Signed)
Patient advised to reduce carb and sweets, commit to regular physical activity, take meds as prescribed, test blood as directed, and attempt to lose weight, to improve blood sugar control. Updated lab needed   

## 2012-04-24 LAB — COMPLETE METABOLIC PANEL WITH GFR
Albumin: 4.1 g/dL (ref 3.5–5.2)
CO2: 28 mEq/L (ref 19–32)
Chloride: 101 mEq/L (ref 96–112)
GFR, Est African American: >89 mL/min
GFR, Est Non African American: 89 mL/min
Glucose, Bld: 106 mg/dL — ABNORMAL HIGH (ref 70–99)
Potassium: 4.5 mEq/L (ref 3.5–5.3)
Sodium: 138 mEq/L (ref 135–145)
Total Protein: 7.6 g/dL (ref 6.0–8.3)

## 2012-04-24 LAB — CBC
Platelets: 242 10*3/uL (ref 150–400)
RBC: 4.34 MIL/uL (ref 3.87–5.11)
RDW: 14.3 % (ref 11.5–15.5)
WBC: 5.4 10*3/uL (ref 4.0–10.5)

## 2012-04-24 LAB — LIPID PANEL: LDL Cholesterol: 100 mg/dL — ABNORMAL HIGH (ref 0–99)

## 2012-04-24 LAB — HEMOGLOBIN A1C
Hgb A1c MFr Bld: 6.2 % — ABNORMAL HIGH (ref <5.7)
Mean Plasma Glucose: 131 mg/dL — ABNORMAL HIGH (ref <117)

## 2012-04-26 LAB — VITAMIN D 25 HYDROXY (VIT D DEFICIENCY, FRACTURES): Vit D, 25-Hydroxy: 25 ng/mL — ABNORMAL LOW (ref 30–89)

## 2012-04-30 ENCOUNTER — Other Ambulatory Visit: Payer: Self-pay | Admitting: Family Medicine

## 2012-05-03 ENCOUNTER — Ambulatory Visit (HOSPITAL_COMMUNITY)
Admission: RE | Admit: 2012-05-03 | Discharge: 2012-05-03 | Disposition: A | Discharge status: Home / Self Care | Payer: PRIVATE HEALTH INSURANCE | Source: Ambulatory Visit | Attending: Family Medicine | Admitting: Family Medicine

## 2012-05-03 ENCOUNTER — Ambulatory Visit (INDEPENDENT_AMBULATORY_CARE_PROVIDER_SITE_OTHER): Payer: PRIVATE HEALTH INSURANCE | Admitting: Family Medicine

## 2012-05-03 ENCOUNTER — Encounter: Payer: Self-pay | Admitting: Family Medicine

## 2012-05-03 VITALS — BP 124/82 | HR 89 | Resp 15 | Ht 61.5 in | Wt 210.0 lb

## 2012-05-03 DIAGNOSIS — R109 Unspecified abdominal pain: Secondary | ICD-10-CM | POA: Insufficient documentation

## 2012-05-03 DIAGNOSIS — M546 Pain in thoracic spine: Secondary | ICD-10-CM | POA: Insufficient documentation

## 2012-05-03 DIAGNOSIS — I10 Essential (primary) hypertension: Secondary | ICD-10-CM

## 2012-05-03 DIAGNOSIS — R52 Pain, unspecified: Secondary | ICD-10-CM

## 2012-05-03 DIAGNOSIS — M549 Dorsalgia, unspecified: Secondary | ICD-10-CM | POA: Insufficient documentation

## 2012-05-03 LAB — POCT URINALYSIS DIPSTICK
Leukocytes, UA: NEGATIVE
Nitrite, UA: NEGATIVE
Protein, UA: NEGATIVE
Urobilinogen, UA: 0.2
pH, UA: 6

## 2012-05-03 MED ORDER — METHYLPREDNISOLONE ACETATE 80 MG/ML IJ SUSP
80.0000 mg | Freq: Once | INTRAMUSCULAR | Status: AC
  Administered 2012-05-03: 80 mg via INTRAMUSCULAR

## 2012-05-03 MED ORDER — PREDNISONE (PAK) 5 MG PO TABS: 5.0000 mg | ORAL_TABLET | ORAL | Status: DC

## 2012-05-03 MED ORDER — KETOROLAC TROMETHAMINE 60 MG/2ML IJ SOLN
60.0000 mg | Freq: Once | INTRAMUSCULAR | Status: AC
  Administered 2012-05-03: 60 mg via INTRAMUSCULAR

## 2012-05-03 MED ORDER — CYCLOBENZAPRINE HCL 10 MG PO TABS: ORAL_TABLET | ORAL | Status: DC

## 2012-05-03 MED ORDER — TRAMADOL HCL 50 MG PO TABS: 50.0000 mg | ORAL_TABLET | Freq: Four times a day (QID) | ORAL | Status: DC | PRN

## 2012-05-03 NOTE — Assessment & Plan Note (Signed)
acite back pain which is limiting movement and sleep. Toradol and depo medrol in office , steroids, muscle relaxant and tramadol. Xray of thoracic spine

## 2012-05-03 NOTE — Addendum Note (Signed)
Addended by: Kandis Fantasia B on: 05/03/2012 02:07 PM   Modules accepted: Orders

## 2012-05-03 NOTE — Assessment & Plan Note (Signed)
Controlled, no change in medication DASH diet and commitment to daily physical activity for a minimum of 30 minutes discussed and encouraged, as a part of hypertension management. The importance of attaining a healthy weight is also discussed.  

## 2012-05-03 NOTE — Patient Instructions (Addendum)
F/u as before.  Please call if no better in the next several days.  You are being treated for musculo skeletal pain, toradol and depo medrol administered in office, prednisone, muscle relaxant and tramadol prescribed.  Urine test is normal   Please get the xray of your spine ordered when you leave today

## 2012-05-03 NOTE — Progress Notes (Signed)
  Subjective:    Patient ID: Samantha Vega, female    DOB: May 14, 1951, 61 y.o.   MRN: [id number]  HPI 2 day h/o lef sided low back pain, non radiating , no specific inciting factor, worse when she lies down, walking eases the pain, coughing or sneezing has no effect.Pain kept up pt, never had similar pain No leg pain , weakness or numbness. No incontinence of stool or urine  Review of Systems See HPI' Denies recent fever or chills. Denies sinus pressure, nasal congestion, ear pain or sore throat. Denies chest congestion, productive cough or wheezing. Denies chest pains, palpitations and leg swelling Denies abdominal pain, nausea, vomiting,diarrhea or constipation.   Denies dysuria,  hesitancy or incontinence.Frequency noted  Denies headaches, seizures, numbness, or tingling. Denies depression or  anxiety has  Insomnia.due to pain Denies skin break down or rash.        Objective:   Physical Exam  Patient alert and oriented and in no cardiopulmonary distress.  HEENT: No facial asymmetry, EOMI, no sinus tenderness,  oropharynx pink and moist.  Neck supple no adenopathy.  Chest: Clear to auscultation bilaterally.  CVS: S1, S2 no murmurs, no S3.  ABD: Soft non tender. Bowel sounds normal.  Ext: No edema  MS: decreased ROM of thoracic spine due to pain and spasm  Skin: Intact, no ulcerations or rash noted.  Psych: Good eye contact, normal affect. Memory intact not anxious or depressed appearing.  CNS: CN 2-12 intact, power, tone and sensation normal throughout.       Assessment & Plan:

## 2012-05-03 NOTE — Assessment & Plan Note (Signed)
CCUA in office absolutely negative for infection or blood

## 2012-05-05 ENCOUNTER — Encounter: Payer: Self-pay | Admitting: Orthopedic Surgery

## 2012-05-05 ENCOUNTER — Ambulatory Visit: Payer: PRIVATE HEALTH INSURANCE

## 2012-05-05 ENCOUNTER — Ambulatory Visit (INDEPENDENT_AMBULATORY_CARE_PROVIDER_SITE_OTHER): Payer: PRIVATE HEALTH INSURANCE | Admitting: Orthopedic Surgery

## 2012-05-05 ENCOUNTER — Ambulatory Visit (INDEPENDENT_AMBULATORY_CARE_PROVIDER_SITE_OTHER): Payer: PRIVATE HEALTH INSURANCE

## 2012-05-05 VITALS — Ht 61.5 in | Wt 205.0 lb

## 2012-05-05 DIAGNOSIS — IMO0002 Reserved for concepts with insufficient information to code with codable children: Secondary | ICD-10-CM

## 2012-05-05 DIAGNOSIS — M25569 Pain in unspecified knee: Secondary | ICD-10-CM

## 2012-05-05 DIAGNOSIS — M179 Osteoarthritis of knee, unspecified: Secondary | ICD-10-CM

## 2012-05-05 DIAGNOSIS — M1710 Unilateral primary osteoarthritis, unspecified knee: Secondary | ICD-10-CM

## 2012-05-05 HISTORY — DX: Osteoarthritis of knee, unspecified: M17.9

## 2012-05-05 HISTORY — DX: Unilateral primary osteoarthritis, unspecified knee: M17.10

## 2012-05-05 MED ORDER — TRAMADOL-ACETAMINOPHEN 37.5-325 MG PO TABS: 1.0000 | ORAL_TABLET | ORAL | Status: DC | PRN

## 2012-05-05 MED ORDER — NABUMETONE 500 MG PO TABS: 500.0000 mg | ORAL_TABLET | Freq: Two times a day (BID) | ORAL | Status: DC

## 2012-05-05 NOTE — Progress Notes (Signed)
Patient ID: Samantha Vega, female   DOB: 05/22/1951, 61 y.o.   MRN: [id number] Chief Complaint  Patient presents with  . Knee Pain    Bilateral knee pain, no injury.     This is a referral from Dr. Syliva Overman  Bilateral knee pain is described  The patient has had pain in the front of her knees for approximately 2 months. The pain is dull 2/10 occurs when she's on her feet better without activity tends to come and go no trauma  Review of systems negative  Medical history recorded and reviewed  Vital signs are stable as recorded  General appearance is normal  The patient is alert and oriented x3  The patient's mood and affect are normal  Gait assessment: Normal ambulation The cardiovascular exam reveals normal pulses and temperature without edema swelling.  The lymphatic system is negative for palpable lymph nodes  The sensory exam is normal.  There are no pathologic reflexes.  Balance is normal.   Exam of the bilateral knee inspection reveals no specific tenderness or swelling. There is some painful crepitance in the knee with normal range of motion stability and strength  Skin is intact  Impression after review of the x-rays that she has osteoarthritis  Recommend nonoperative protocol.

## 2012-05-05 NOTE — Patient Instructions (Addendum)
nurtition consult  Therapy   Start medications pick up from the pharmacy         Osteoarthritis Osteoarthritis is the most common form of arthritis. It is redness, soreness, and swelling (inflammation) affecting the cartilage. Cartilage acts as a cushion, covering the ends of bones where they meet to form a joint. CAUSES   Over time, the cartilage begins to wear away. This causes bone to rub on bone. This produces pain and stiffness in the affected joints. Factors that contribute to this problem are:  Excessive body weight.   Age.   Overuse of joints.  SYMPTOMS    People with osteoarthritis usually experience joint pain, swelling, or stiffness.   Over time, the joint may lose its normal shape.   Small deposits of bone (osteophytes) may grow on the edges of the joint.   Bits of bone or cartilage can break off and float inside the joint space. This may cause more pain and damage.   Osteoarthritis can lead to depression, anxiety, feelings of helplessness, and limitations on daily activities.  The most commonly affected joints are in the:  Ends of the fingers.   Thumbs.   Neck.   Lower back.   Knees.   Hips.  DIAGNOSIS  Diagnosis is mostly based on your symptoms and exam. Tests may be helpful, including:  X-rays of the affected joint.   A computerized magnetic scan (MRI).   Blood tests to rule out other types of arthritis.   Joint fluid tests. This involves using a needle to draw fluid from the joint and examining the fluid under a microscope.  TREATMENT   Goals of treatment are to control pain, improve joint function, maintain a normal body weight, and maintain a healthy lifestyle. Treatment approaches may include:  A prescribed exercise program with rest and joint relief.   Weight control with nutritional education.   Pain relief techniques such as:   Properly applied heat and cold.   Electric pulses delivered to nerve endings under the skin  (transcutaneous electrical nerve stimulation, TENS).   Massage.   Certain supplements. Ask your caregiver before using any supplements, especially in combination with prescribed drugs.   Medicines to control pain, such as:   Acetaminophen.   Nonsteroidal anti-inflammatory drugs (NSAIDs), such as naproxen.   Narcotic or central-acting agents, such as tramadol. This drug carries a risk of addiction and is generally prescribed for short-term use.   Corticosteroids. These can be given orally or as injection. This is a short-term treatment, not recommended for routine use.   Surgery to reposition the bones and relieve pain (osteotomy) or to remove loose pieces of bone and cartilage. Joint replacement may be needed in advanced states of osteoarthritis.  HOME CARE INSTRUCTIONS   Your caregiver can recommend specific types of exercise. These may include:  Strengthening exercises. These are done to strengthen the muscles that support joints affected by arthritis. They can be performed with weights or with exercise bands to add resistance.   Aerobic activities. These are exercises, such as brisk walking or low-impact aerobics, that get your heart pumping. They can help keep your lungs and circulatory system in shape.   Range-of-motion activities. These keep your joints limber.   Balance and agility exercises. These help you maintain daily living skills.  Learning about your condition and being actively involved in your care will help improve the course of your osteoarthritis. SEEK MEDICAL CARE IF:    You feel hot or your skin turns red.  You develop a rash in addition to your joint pain.   You have an oral temperature above 102 F (38.9 C).  FOR MORE INFORMATION   National Institute of Arthritis and Musculoskeletal and Skin Diseases: www.niams.http://www.myers.net/ [employer] on Aging: https://walker.com/ American College of Rheumatology: www.rheumatology.org Document Released: 06/02/2005  Document Revised: 08/25/2011 Document Reviewed: 09/13/2009 [facility] Patient Information 2013 [place], [place].

## 2012-05-11 ENCOUNTER — Telehealth: Payer: Self-pay | Admitting: Family Medicine

## 2012-05-11 ENCOUNTER — Telehealth (HOSPITAL_COMMUNITY): Payer: Self-pay | Admitting: Dietician

## 2012-05-11 NOTE — Telephone Encounter (Signed)
Samantha Vega called seeking advice. She said her doctor told her to see a nutritionist. No referrals for nutrition therapy are on file. Offered diabetes class to pt on 05/25/12, but she refused, stating she did not want to travel in possible inclement weather. Informed her that a referral was needed for an individual visit if she desires an individual appointment.  Offered to sent educational handouts to her home. She is agreeable to this plan. Provided AND handouts re: weight management (1500 Calorie Diet and Weight Loss Tips) and plate method handout.

## 2012-05-17 ENCOUNTER — Telehealth (HOSPITAL_COMMUNITY): Payer: Self-pay | Admitting: Dietician

## 2012-05-17 NOTE — Telephone Encounter (Addendum)
Appointment scheduled for 05/19/12 at 11:30 AM. Mailed appointment confirmation letter and instructions via [place] [employer].

## 2012-05-17 NOTE — Telephone Encounter (Signed)
Referral sent to nutritionist

## 2012-05-17 NOTE — Telephone Encounter (Signed)
Received referral via fax from Dr. Simpson for dx: obesity, diabetes.  

## 2012-05-19 ENCOUNTER — Encounter (HOSPITAL_COMMUNITY): Payer: Self-pay | Admitting: Dietician

## 2012-05-19 ENCOUNTER — Ambulatory Visit (HOSPITAL_COMMUNITY)
Admission: RE | Admit: 2012-05-19 | Discharge: 2012-05-19 | Disposition: A | Discharge status: Home / Self Care | Payer: PRIVATE HEALTH INSURANCE | Source: Ambulatory Visit | Attending: Family Medicine | Admitting: Family Medicine

## 2012-05-19 DIAGNOSIS — M1710 Unilateral primary osteoarthritis, unspecified knee: Secondary | ICD-10-CM | POA: Diagnosis present

## 2012-05-19 DIAGNOSIS — E119 Type 2 diabetes mellitus without complications: Secondary | ICD-10-CM | POA: Insufficient documentation

## 2012-05-19 DIAGNOSIS — M6281 Muscle weakness (generalized): Secondary | ICD-10-CM | POA: Insufficient documentation

## 2012-05-19 DIAGNOSIS — IMO0001 Reserved for inherently not codable concepts without codable children: Secondary | ICD-10-CM | POA: Insufficient documentation

## 2012-05-19 DIAGNOSIS — M179 Osteoarthritis of knee, unspecified: Secondary | ICD-10-CM | POA: Diagnosis present

## 2012-05-19 DIAGNOSIS — I10 Essential (primary) hypertension: Secondary | ICD-10-CM | POA: Insufficient documentation

## 2012-05-19 DIAGNOSIS — M25561 Pain in right knee: Secondary | ICD-10-CM | POA: Diagnosis present

## 2012-05-19 DIAGNOSIS — M25569 Pain in unspecified knee: Secondary | ICD-10-CM | POA: Insufficient documentation
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
## 2012-05-19 NOTE — Progress Notes (Signed)
noted 

## 2012-05-21 ENCOUNTER — Ambulatory Visit (HOSPITAL_COMMUNITY)
Admission: RE | Admit: 2012-05-21 | Discharge: 2012-05-21 | Disposition: A | Discharge status: Home / Self Care | Payer: PRIVATE HEALTH INSURANCE | Source: Ambulatory Visit | Attending: Family Medicine | Admitting: Family Medicine

## 2012-05-21 NOTE — Progress Notes (Signed)
Physical Therapy Treatment Patient Details  Name: Samantha Vega MRN: [id number] Date of Birth: 29-Jan-1951  Today's Date: 05/21/2012 Time: 1305-1350 PT Time Calculation (min): 45 min  Visit#: 2  of 8   Re-eval: 06/18/12 Assessment Diagnosis: B knee OA Next MD Visit: Dr.Harrison -  Charge: gait 8', manual 8', therex 29'  Subjective: Symptoms/Limitations Symptoms: Pt reported compliance with HEP, no questions with any exercise.  Pt stated B knees feeling better today, pain free. Pain Assessment Currently in Pain?: No/denies  Objective:   Exercise/Treatments Standing Heel Raises: 15 reps;Limitations Heel Raises Limitations: toe raises 15 reps Knee Flexion: 15 reps;Both Functional Squat: 15 reps SLS: 1x 30" with 1 HHA BLE Gait Training: Gait training x 200' with cueing for equal stride length and to normalize gait velocity Other Standing Knee Exercises: tandem stance 2x 30", tandem gait 1 RT, Retro gait 1 RT Seated Other Seated Knee Exercises: heel roll outs 10x 10" Supine Quad Sets: Both;10 reps Straight Leg Raises: Both;10 reps Sidelying Hip ABduction: Both;10 reps Hip ADduction: Both;10 reps Prone  Hip Extension: Both;10 reps   Manual Therapy Manual Therapy: Joint mobilization Joint Mobilization: patella mobs and tib/fib x 4' each knee= 8' total  Physical Therapy Assessment and Plan PT Assessment and Plan Clinical Impression Statement: Began PTtreatment for improved gait mechanics and LE strengthening.  Pt able to demonstrate appropriate technique with all exercises following description and cueing for tech without difficulty though limited by fatigue with increase activity demand.  Patella mobs complete at end of session for patella tracking and tib/fib mobs for pain control. PT Plan: Continue with current POC for BLE strengthening, balance and gait mechanics.    Goals    Problem List Patient Active Problem List  Diagnosis  . HYPERLIPIDEMIA  . Unspecified  visual loss  . HYPERTENSION  . Heart palpitations  . RBBB (right bundle branch block)  . Obesity  . DM (diabetes mellitus), type 2  . Knee pain, bilateral  . Thoracic back pain  . Acute right flank pain  . OA (osteoarthritis) of knee    PT - End of Session Activity Tolerance: Patient tolerated treatment well General Behavior During Session: [facility] for tasks performed Cognition: [facility] for tasks performed  GP    Juel Burrow 05/21/2012, 1:51 PM

## 2012-05-22 ENCOUNTER — Inpatient Hospital Stay (HOSPITAL_COMMUNITY)
Admission: EM | Admit: 2012-05-22 | Discharge: 2012-05-23 | DRG: 310 | Disposition: A | Discharge status: Home / Self Care | Payer: PRIVATE HEALTH INSURANCE | Attending: Internal Medicine | Admitting: Internal Medicine

## 2012-05-22 ENCOUNTER — Emergency Department (HOSPITAL_COMMUNITY): Payer: PRIVATE HEALTH INSURANCE

## 2012-05-22 ENCOUNTER — Encounter (HOSPITAL_COMMUNITY): Payer: Self-pay | Admitting: *Deleted

## 2012-05-22 DIAGNOSIS — M546 Pain in thoracic spine: Secondary | ICD-10-CM

## 2012-05-22 DIAGNOSIS — H547 Unspecified visual loss: Secondary | ICD-10-CM

## 2012-05-22 DIAGNOSIS — E669 Obesity, unspecified: Secondary | ICD-10-CM | POA: Diagnosis present

## 2012-05-22 DIAGNOSIS — Z90710 Acquired absence of both cervix and uterus: Secondary | ICD-10-CM

## 2012-05-22 DIAGNOSIS — R51 Headache: Secondary | ICD-10-CM | POA: Diagnosis present

## 2012-05-22 DIAGNOSIS — I4510 Unspecified right bundle-branch block: Secondary | ICD-10-CM

## 2012-05-22 DIAGNOSIS — I480 Paroxysmal atrial fibrillation: Secondary | ICD-10-CM | POA: Diagnosis present

## 2012-05-22 DIAGNOSIS — M179 Osteoarthritis of knee, unspecified: Secondary | ICD-10-CM

## 2012-05-22 DIAGNOSIS — M25561 Pain in right knee: Secondary | ICD-10-CM

## 2012-05-22 DIAGNOSIS — R7302 Impaired glucose tolerance (oral): Secondary | ICD-10-CM | POA: Diagnosis present

## 2012-05-22 DIAGNOSIS — Z7982 Long term (current) use of aspirin: Secondary | ICD-10-CM

## 2012-05-22 DIAGNOSIS — Z6833 Body mass index (BMI) 33.0-33.9, adult: Secondary | ICD-10-CM

## 2012-05-22 DIAGNOSIS — Z79899 Other long term (current) drug therapy: Secondary | ICD-10-CM

## 2012-05-22 DIAGNOSIS — E119 Type 2 diabetes mellitus without complications: Secondary | ICD-10-CM | POA: Diagnosis present

## 2012-05-22 DIAGNOSIS — I10 Essential (primary) hypertension: Secondary | ICD-10-CM | POA: Diagnosis present

## 2012-05-22 DIAGNOSIS — R109 Unspecified abdominal pain: Secondary | ICD-10-CM

## 2012-05-22 DIAGNOSIS — E785 Hyperlipidemia, unspecified: Secondary | ICD-10-CM | POA: Diagnosis present

## 2012-05-22 DIAGNOSIS — IMO0002 Reserved for concepts with insufficient information to code with codable children: Secondary | ICD-10-CM | POA: Diagnosis present

## 2012-05-22 DIAGNOSIS — R002 Palpitations: Secondary | ICD-10-CM

## 2012-05-22 DIAGNOSIS — I4891 Unspecified atrial fibrillation: Principal | ICD-10-CM | POA: Diagnosis present

## 2012-05-22 DIAGNOSIS — M1710 Unilateral primary osteoarthritis, unspecified knee: Secondary | ICD-10-CM

## 2012-05-22 HISTORY — DX: Cardiomegaly: I51.7

## 2012-05-22 LAB — COMPREHENSIVE METABOLIC PANEL
ALT: 33 U/L (ref 0–35)
AST: 26 U/L (ref 0–37)
Alkaline Phosphatase: 100 U/L (ref 39–117)
CO2: 28 mEq/L (ref 19–32)
Chloride: 99 mEq/L (ref 96–112)
GFR calc non Af Amer: 67 mL/min — ABNORMAL LOW (ref >90)
Potassium: 4.3 mEq/L (ref 3.5–5.1)
Sodium: 136 mEq/L (ref 135–145)
Total Bilirubin: 0.2 mg/dL — ABNORMAL LOW (ref 0.3–1.2)

## 2012-05-22 LAB — CBC WITH DIFFERENTIAL/PLATELET
Basophils Absolute: 0 10*3/uL (ref 0.0–0.1)
Lymphocytes Relative: 41 % (ref 12–46)
Lymphs Abs: 2.5 10*3/uL (ref 0.7–4.0)
Neutro Abs: 2.9 10*3/uL (ref 1.7–7.7)
Platelets: 223 10*3/uL (ref 150–400)
RBC: 4.51 MIL/uL (ref 3.87–5.11)
RDW: 14.2 % (ref 11.5–15.5)
WBC: 6.1 10*3/uL (ref 4.0–10.5)

## 2012-05-22 LAB — D-DIMER, QUANTITATIVE: D-Dimer, Quant: 0.27 ug/mL-FEU (ref 0.00–0.48)

## 2012-05-22 LAB — MRSA PCR SCREENING: MRSA by PCR: NEGATIVE

## 2012-05-22 LAB — RAPID URINE DRUG SCREEN, HOSP PERFORMED
Amphetamines: NOT DETECTED
Cocaine: NOT DETECTED
Opiates: NOT DETECTED

## 2012-05-22 LAB — URINALYSIS, ROUTINE W REFLEX MICROSCOPIC
Bilirubin Urine: NEGATIVE
Hgb urine dipstick: NEGATIVE
Ketones, ur: NEGATIVE mg/dL
Specific Gravity, Urine: <1.005 — ABNORMAL LOW (ref 1.005–1.030)
Urobilinogen, UA: 0.2 mg/dL (ref 0.0–1.0)

## 2012-05-22 LAB — PROTIME-INR: Prothrombin Time: 13.1 seconds (ref 11.6–15.2)

## 2012-05-22 LAB — ETHANOL: Alcohol, Ethyl (B): <11 mg/dL (ref 0–11)

## 2012-05-22 MED ORDER — DIAZEPAM 5 MG PO TABS: 5.0000 mg | ORAL_TABLET | Freq: Every evening | ORAL | Status: DC | PRN

## 2012-05-22 MED ORDER — DILTIAZEM HCL 100 MG IV SOLR
5.0000 mg/h | INTRAVENOUS | Status: DC
  Filled 2012-05-22: qty 100

## 2012-05-22 MED ORDER — DILTIAZEM HCL 25 MG/5ML IV SOLN
10.0000 mg | Freq: Once | INTRAVENOUS | Status: AC
  Administered 2012-05-22: 10 mg via INTRAVENOUS

## 2012-05-22 MED ORDER — METFORMIN HCL 500 MG PO TABS
500.0000 mg | ORAL_TABLET | Freq: Two times a day (BID) | ORAL | Status: DC
  Administered 2012-05-23: 500 mg via ORAL
  Filled 2012-05-22: qty 1

## 2012-05-22 MED ORDER — NABUMETONE 500 MG PO TABS
500.0000 mg | ORAL_TABLET | Freq: Two times a day (BID) | ORAL | Status: DC
  Administered 2012-05-22 (×2): 500 mg via ORAL
  Filled 2012-05-22 (×2): qty 1

## 2012-05-22 MED ORDER — INSULIN ASPART 100 UNIT/ML ~~LOC~~ SOLN: 0.0000 [IU] | Freq: Three times a day (TID) | SUBCUTANEOUS | Status: DC

## 2012-05-22 MED ORDER — HEPARIN SODIUM (PORCINE) 5000 UNIT/ML IJ SOLN
5000.0000 [IU] | Freq: Three times a day (TID) | INTRAMUSCULAR | Status: DC
  Administered 2012-05-22 – 2012-05-23 (×3): 5000 [IU] via SUBCUTANEOUS
  Filled 2012-05-22 (×3): qty 1

## 2012-05-22 MED ORDER — HYDROCHLOROTHIAZIDE 12.5 MG PO CAPS
12.5000 mg | ORAL_CAPSULE | Freq: Every day | ORAL | Status: DC
  Administered 2012-05-22: 12.5 mg via ORAL
  Filled 2012-05-22: qty 1

## 2012-05-22 MED ORDER — DILTIAZEM HCL 25 MG/5ML IV SOLN
INTRAVENOUS | Status: AC
  Administered 2012-05-22: 10 mg via INTRAVENOUS
  Filled 2012-05-22: qty 5

## 2012-05-22 MED ORDER — CYCLOBENZAPRINE HCL 10 MG PO TABS: 5.0000 mg | ORAL_TABLET | Freq: Three times a day (TID) | ORAL | Status: DC | PRN

## 2012-05-22 MED ORDER — DILTIAZEM HCL 100 MG IV SOLR
5.0000 mg/h | Freq: Once | INTRAVENOUS | Status: AC
  Administered 2012-05-22: 5 mg/h via INTRAVENOUS
  Filled 2012-05-22: qty 100

## 2012-05-22 MED ORDER — ONDANSETRON HCL 4 MG PO TABS: 4.0000 mg | ORAL_TABLET | Freq: Four times a day (QID) | ORAL | Status: DC | PRN

## 2012-05-22 MED ORDER — ATORVASTATIN CALCIUM 40 MG PO TABS
40.0000 mg | ORAL_TABLET | Freq: Every day | ORAL | Status: DC
  Administered 2012-05-22: 40 mg via ORAL
  Filled 2012-05-22: qty 1

## 2012-05-22 MED ORDER — SODIUM CHLORIDE 0.9 % IV SOLN
INTRAVENOUS | Status: DC
  Administered 2012-05-22: 500 mL via INTRAVENOUS
  Administered 2012-05-22: 20:00:00 via INTRAVENOUS

## 2012-05-22 MED ORDER — SODIUM CHLORIDE 0.9 % IJ SOLN
3.0000 mL | Freq: Two times a day (BID) | INTRAMUSCULAR | Status: DC
  Administered 2012-05-22: 3 mL via INTRAVENOUS

## 2012-05-22 MED ORDER — ONDANSETRON HCL 4 MG/2ML IJ SOLN: 4.0000 mg | Freq: Four times a day (QID) | INTRAMUSCULAR | Status: DC | PRN

## 2012-05-22 MED ORDER — BENAZEPRIL HCL 10 MG PO TABS
20.0000 mg | ORAL_TABLET | Freq: Every day | ORAL | Status: DC
  Administered 2012-05-22: 20 mg via ORAL
  Filled 2012-05-22: qty 2

## 2012-05-22 MED ORDER — TRAMADOL-ACETAMINOPHEN 37.5-325 MG PO TABS: 1.0000 | ORAL_TABLET | ORAL | Status: DC | PRN

## 2012-05-22 MED ORDER — INSULIN ASPART 100 UNIT/ML ~~LOC~~ SOLN: 0.0000 [IU] | Freq: Every day | SUBCUTANEOUS | Status: DC

## 2012-05-22 MED ORDER — ASPIRIN EC 81 MG PO TBEC
81.0000 mg | DELAYED_RELEASE_TABLET | Freq: Every day | ORAL | Status: DC
  Administered 2012-05-22: 81 mg via ORAL
  Filled 2012-05-22: qty 1

## 2012-05-22 MED ORDER — DEXTROSE 5 % IV SOLN
5.0000 mg/h | Freq: Once | INTRAVENOUS | Status: DC
  Filled 2012-05-22: qty 100

## 2012-05-22 MED ORDER — METOPROLOL SUCCINATE ER 25 MG PO TB24
25.0000 mg | ORAL_TABLET | Freq: Two times a day (BID) | ORAL | Status: DC
  Administered 2012-05-22: 25 mg via ORAL
  Filled 2012-05-22: qty 1

## 2012-05-22 MED ORDER — BENAZEPRIL-HYDROCHLOROTHIAZIDE 20-12.5 MG PO TABS: 1.0000 | ORAL_TABLET | Freq: Every day | ORAL | Status: DC

## 2012-05-22 NOTE — ED Notes (Signed)
cbg en route per EMS 114.

## 2012-05-22 NOTE — ED Notes (Signed)
MD at bedside.  Dr. Randol Kern in .

## 2012-05-22 NOTE — ED Provider Notes (Signed)
History  This chart was scribed for Charles B. Bernette Mayers, MD by Erskine Emery, ED Scribe. This patient was seen in room APA10/[facility]10 and the patient's care was started at 11:19.   CSN: [id number]  Arrival date & time 05/22/12  1115   First MD Initiated Contact with Patient 05/22/12 1119     Level 5 Caveat--Mental status change  Chief Complaint  Patient presents with  . Headache    (Consider location/radiation/quality/duration/timing/severity/associated sxs/prior Treatment) Level 5 Caveat--Mental status change The history is provided by the EMS personnel, a friend and the spouse. The history is limited by the condition of the patient. No language interpreter was used.  Samantha Vega is a 61 y.o. female who presents to the Emergency Department complaining of a headache, chest pain, and trembling since this morning. Pt's friend and husband report when she began trembling and crying, it appeared as if she was awake but she could not speak. Pt denies any emesis. EMS was called for seizures and the fire dept was called for a possible stroke. EMS report she exhibited bilateral weakness, confusion, and lethargy, but her her eyes were PEERL upon arrival. En route, her blood sugar was 114, her blood pressure was 156/104, and her pulse was 130. Pt has recent onset seizures and a h/o right bundle branch block, HTN, DM, obesity, hyperlipidemia, and knee pain. She is taking Metformin, flexeril, reflafen, and ultracet. She has come into the ED multiple times for chest pain. Pt went to physical therapy for her knee Thursday and Friday of this week and was able to fully participate.  Dr. Lodema Hong, who she saw last week, is the pt's PCP. She sees Dr. Romeo Apple for her knee pain.  Past Medical History  Diagnosis Date  . Right bundle branch block     +Palpitations.   EKG 11/2006:  NSR, RBBB, LAFB, markedly delayed R-wave progression, no change; Echocardiogram in 10/2006-suboptimal quality, normal; Event  recorder-PVCs, no significant arrhythmias or symptoms ;  . Obesity   . Hyperlipidemia   . Hypertension   . Diabetes mellitus   . Chest pain     Noncardiac; evaluated and treated in 2008    Past Surgical History  Procedure Date  . Total abdominal hysterectomy w/ bilateral salpingoophorectomy   . Shoulder surgery 2006    Left-post-trauma    Family History  Problem Relation Age of Onset  . Hypertension Mother     and sister  . Arthritis Mother   . Stroke Father   . Hypertension Father   . Diabetes Sister     x1  . Sarcoidosis Sister     x1  . Heart failure Brother   . Coronary artery disease Father     History  Substance Use Topics  . Smoking status: Never Smoker   . Smokeless tobacco: Never Used  . Alcohol Use: No    OB History    Grav Para Term Preterm Abortions TAB SAB Ect Mult Living                  Review of Systems  Unable to perform ROS: Mental status change    Allergies  Review of patient's allergies indicates no known allergies.  Home Medications   Current Outpatient Rx  Name  Route  Sig  Dispense  Refill  . ASPIRIN 81 MG PO TBEC   Oral   Take 81 mg by mouth daily.           Marland Kitchen BENAZEPRIL-HYDROCHLOROTHIAZIDE 20-12.5 MG PO TABS  TAKE 1 TABLET BY MOUTH DAILY.   90 tablet   1   . CALCIUM-VITAMIN D 500 MG PO TABS   Oral   Take 1 tablet by mouth 2 (two) times daily.           . CYCLOBENZAPRINE HCL 10 MG PO TABS      One tablet at bedtime , for muscle spasm   30 tablet   0   . GLUCOSE BLOOD VI STRP      For once daily testing  Dx 250.00   100 each   5   . IBUPROFEN 600 MG PO TABS      TAKE 1 TABLET BY MOUTH 3 (THREE) TIMES DAILY AS NEEDED FOR PAIN.   90 tablet   1   . IBUPROFEN 600 MG PO TABS      TAKE 1 TABLET BY MOUTH 3 (THREE) TIMES DAILY AS NEEDED FOR PAIN.   90 tablet   1   . ACCU-CHEK MULTICLIX LANCETS MISC   Other   1 each by Other route as needed. Use as instructed          . METFORMIN HCL 500 MG PO  TABS      TAKE 1 TABLET BY MOUTH 2 TIMES DAILY WITH A MEAL.   180 tablet   1   . METOPROLOL SUCCINATE ER 25 MG PO TB24   Oral   Take 1 tablet (25 mg total) by mouth 2 (two) times daily. Dose increase   180 tablet   1   . MULTIVITAMINS PO   Oral   Take by mouth daily.           Marland Kitchen NABUMETONE 500 MG PO TABS   Oral   Take 1 tablet (500 mg total) by mouth 2 (two) times daily.   60 tablet   5   . PREDNISONE (PAK) 5 MG PO TABS   Oral   Take 1 tablet (5 mg total) by mouth as directed. Use as directed   21 tablet   0   . ROSUVASTATIN CALCIUM 20 MG PO TABS   Oral   Take 1 tablet (20 mg total) by mouth at bedtime.   90 tablet   1   . TRAMADOL HCL 50 MG PO TABS   Oral   Take 1 tablet (50 mg total) by mouth every 6 (six) hours as needed for pain.   20 tablet   0   . TRAMADOL-ACETAMINOPHEN 37.5-325 MG PO TABS   Oral   Take 1-2 tablets by mouth every 4 (four) hours as needed for pain.   90 tablet   5     There were no vitals taken for this visit.  Physical Exam  Nursing note and vitals reviewed. Constitutional: She appears well-developed and well-nourished.  HENT:  Head: Normocephalic and atraumatic.  Eyes: EOM are normal. Pupils are equal, round, and reactive to light.  Neck: Normal range of motion. Neck supple.  Cardiovascular: Normal rate, normal heart sounds and intact distal pulses.  An irregular rhythm present.  Pulmonary/Chest: Effort normal and breath sounds normal.  Abdominal: Bowel sounds are normal. She exhibits no distension. There is no tenderness.  Musculoskeletal: Normal range of motion. She exhibits no edema and no tenderness.  Neurological: She is alert. She has normal strength. No sensory deficit.  Skin: Skin is warm and dry. No rash noted.    ED Course  Procedures (including critical care time) DIAGNOSTIC STUDIES: Oxygen Saturation is 97% on [place], adequate  by my interpretation.    COORDINATION OF CARE: 11:23--I examined the  patient.  11:25--I spoke with the pt's husband and friend.  12:34--I reexamined the pt who is much more alert at this time. Admits to generalized weakness off and on for a while associated with heart racing. Has no known history of afib. Feeling better now. I told her that her heart rhythm is irregular and that we will admit her. Pt denies ever being told she has an irregular heart rhythm before today.   Labs Reviewed  COMPREHENSIVE METABOLIC PANEL - Abnormal; Notable for the following:    BUN 29 (*)     Total Bilirubin 0.2 (*)     GFR calc non Af Amer 67 (*)     GFR calc Af Amer 77 (*)     All other components within normal limits  URINALYSIS, ROUTINE W REFLEX MICROSCOPIC - Abnormal; Notable for the following:    Specific Gravity, Urine <1.005 (*)     All other components within normal limits  CBC WITH DIFFERENTIAL  TROPONIN I  PROTIME-INR  APTT  ETHANOL  URINE RAPID DRUG SCREEN (HOSP PERFORMED)  MAGNESIUM   Dg Chest 1 View  05/22/2012  *RADIOLOGY REPORT*  Clinical Data: Shortness of breath.  CHEST - 1 VIEW  Comparison: Plain film chest 01/11/2004 and CT chest 01/12/2004.  Findings: Lung volumes are low but the lungs are clear.  Heart size upper normal.  No pneumothorax or effusion.  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  IMPRESSION: Low volume chest without acute abnormality.   Original Report Authenticated By: Holley Dexter, M.D.    Ct Head Wo Contrast  05/22/2012  *RADIOLOGY REPORT*  Clinical Data: New onset seizures.  Headaches.  CT HEAD WITHOUT CONTRAST  Technique:  Contiguous axial images were obtained from the base of the skull through the vertex without contrast.  Comparison: Head CT scan 03/16/2010.  Findings: There is no evidence of acute intracranial abnormality including infarct, hemorrhage, mass lesion, mass effect, midline shift or abnormal extra-axial fluid collection.  No hydrocephalus or pneumocephalus.  Cava septum pellucidum is noted.   IMPRESSION: Negative exam.   Original Report Authenticated By: Holley Dexter, M.D.      1. Atrial fibrillation with rapid ventricular response       MDM   Date: 05/22/2012  Rate: 110  Rhythm: atrial fibrillation with RVR  QRS Axis: left  Intervals: normal  ST/T Wave abnormalities: nonspecific T wave changes  Conduction Disutrbances:right bundle branch block and left anterior fascicular block  Narrative Interpretation:   Old EKG Reviewed: changes noted, new afib       I personally performed the services described in this documentation, which was scribed in my presence. The recorded information has been reviewed and is accurate.     Charles B. Bernette Mayers, MD 05/22/12 [phone]

## 2012-05-22 NOTE — ED Notes (Signed)
Report called to Kara Mead, RN in ICU

## 2012-05-22 NOTE — ED Notes (Signed)
Moderate loose, brown stool.

## 2012-05-22 NOTE — ED Notes (Signed)
Per EMS - pt arrived at a friends out for visiting when suddenly started feeling bad.  C/o headache and cp.  Pt tearful, alert, able to respond to some questions.

## 2012-05-22 NOTE — H&P (Signed)
[facility] History and Physical  VELITA QUIRK GNF:[id number] DOB: 1951/05/05 DOA: 05/22/2012  Referring physician: Dr. Bernette Mayers, ER physician. PCP: Syliva Overman, MD    Chief Complaint:  Palpitations.  HPI: Samantha Vega is a 61 y.o. female who presents with a history of palpitations which started at 8 AM this morning when she woke up. There is no associated dyspnea, or chest pain. When she presented to the emergency room, she was found to be in atrial fibrillation with rapid ventricular response. She has been started on intravenous Cardizem drip and a ventricular rate has improved. She has seen Dr. Dietrich Pates, cardiology, in the past for history of palpitations. She has never been documented to have atrial fibrillation before. She denies cough, hemoptysis or fever.   Review of Systems:  Apart from history of present illness, other systems negative.  Past Medical History  Diagnosis Date  . Right bundle branch block     +Palpitations.   EKG 11/2006:  NSR, RBBB, LAFB, markedly delayed R-wave progression, no change; Echocardiogram in 10/2006-suboptimal quality, normal; Event recorder-PVCs, no significant arrhythmias or symptoms ;  . Obesity   . Hyperlipidemia   . Hypertension   . Diabetes mellitus   . Chest pain     Noncardiac; evaluated and treated in 2008  . Enlarged heart    Past Surgical History  Procedure Date  . Total abdominal hysterectomy w/ bilateral salpingoophorectomy   . Shoulder surgery 2006    Left-post-trauma   Social History:  Patient has been married for approximately 23 years and lives with her husband. She does not smoke cigarettes nor did she drink alcohol.  No Known Allergies  Family History  Problem Relation Age of Onset  . Hypertension Mother     and sister  . Arthritis Mother   . Stroke Father   . Hypertension Father   . Diabetes Sister     x1  . Sarcoidosis Sister     x1  . Heart failure Brother   . Coronary artery disease Father        Prior to Admission medications   Medication Sig Start Date End Date Taking? Authorizing Provider  aspirin EC 81 MG tablet Take 81 mg by mouth daily.   Yes Historical Provider, MD  benazepril-hydrochlorthiazide (LOTENSIN HCT) 20-12.5 MG per tablet TAKE 1 TABLET BY MOUTH DAILY. 03/20/12  Yes Kerri Perches, MD  calcium-vitamin D (OSCAL 500/200 D-3) 500 MG tablet Take 1 tablet by mouth 2 (two) times daily.    Yes Historical Provider, MD  cyclobenzaprine (FLEXERIL) 10 MG tablet One tablet at bedtime , for muscle spasm 05/03/12  Yes Kerri Perches, MD  diazepam (VALIUM) 5 MG tablet Take 5 mg by mouth at bedtime as needed. For spasms   Yes Historical Provider, MD  ibuprofen (ADVIL,MOTRIN) 600 MG tablet TAKE 1 TABLET BY MOUTH 3 (THREE) TIMES DAILY AS NEEDED FOR PAIN. 04/30/12  Yes Kerri Perches, MD  metFORMIN (GLUCOPHAGE) 500 MG tablet TAKE 1 TABLET BY MOUTH 2 TIMES DAILY WITH A MEAL. 03/20/12  Yes Kerri Perches, MD  metoprolol succinate (TOPROL-XL) 25 MG 24 hr tablet Take 1 tablet (25 mg total) by mouth 2 (two) times daily. Dose increase 12/12/11  Yes Kerri Perches, MD  Multiple Vitamins-Minerals ([employer] SPECTRAVITE PO) Take 1 tablet by mouth daily.   Yes Historical Provider, MD  nabumetone (RELAFEN) 500 MG tablet Take 1 tablet (500 mg total) by mouth 2 (two) times daily. 05/05/12  Yes Vickki Hearing,  MD  rosuvastatin (CRESTOR) 20 MG tablet Take 1 tablet (20 mg total) by mouth at bedtime. 08/13/11  Yes Kerri Perches, MD  traMADol-acetaminophen (ULTRACET) 37.5-325 MG per tablet Take 1-2 tablets by mouth every 4 (four) hours as needed for pain. 05/05/12  Yes Vickki Hearing, MD   Physical Exam: Filed Vitals:   05/22/12 1228 05/22/12 1230 05/22/12 1245 05/22/12 1300  BP:  111/77 105/75 97/67  Pulse: 89 85 174 95  Temp:      Resp: 19 25 20 20   SpO2: 98% 98% 97% 97%     General:  She looks systemically well. She does not appear to be in any distress.  Eyes: No  jaundice. No pallor.  ENT: No abnormalities.  Neck: No thyroid megaly. No lymphadenopathy.  Cardiovascular: Heart sounds are irregular and consistent with atrial fibrillation. Jugular venous pressure not elevated. There are no murmurs.  Respiratory: Lung fields are clear.  Abdomen: Soft, nontender, no hepatosplenomegaly. No masses.  Skin: No rash.  Musculoskeletal: No joint abnormalities.  Psychiatric: Appropriate affect.  Neurologic: Alert and orientated without any focal neurological signs.  Labs on Admission:  Basic Metabolic Panel:  Lab 05/22/12 8295  NA 136  K 4.3  CL 99  CO2 28  GLUCOSE 98  BUN 29*  CREATININE 0.90  CALCIUM 10.5  MG 1.8  PHOS --   Liver Function Tests:  Lab 05/22/12 1145  AST 26  ALT 33  ALKPHOS 100  BILITOT 0.2*  PROT 8.0  ALBUMIN 3.8     CBC:  Lab 05/22/12 1145  WBC 6.1  NEUTROABS 2.9  HGB 13.2  HCT 38.3  MCV 84.9  PLT 223   Cardiac Enzymes:  Lab 05/22/12 1145  CKTOTAL --  CKMB --  CKMBINDEX --  TROPONINI <0.30   :   Radiological Exams on Admission: Dg Chest 1 View  05/22/2012  *RADIOLOGY REPORT*  Clinical Data: Shortness of breath.  CHEST - 1 VIEW  Comparison: Plain film chest 01/11/2004 and CT chest 01/12/2004.  Findings: Lung volumes are low but the lungs are clear.  Heart size upper normal.  No pneumothorax or effusion.  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  IMPRESSION: Low volume chest without acute abnormality.   Original Report Authenticated By: Holley Dexter, M.D.    Ct Head Wo Contrast  05/22/2012  *RADIOLOGY REPORT*  Clinical Data: New onset seizures.  Headaches.  CT HEAD WITHOUT CONTRAST  Technique:  Contiguous axial images were obtained from the base of the skull through the vertex without contrast.  Comparison: Head CT scan 03/16/2010.  Findings: There is no evidence of acute intracranial abnormality including infarct, hemorrhage, mass lesion, mass effect, midline shift or  abnormal extra-axial fluid collection.  No hydrocephalus or pneumocephalus.  Cava septum pellucidum is noted.  IMPRESSION: Negative exam.   Original Report Authenticated By: Holley Dexter, M.D.     EKG: Independently reviewed. Atrial fibrillation. Bifascicular block.  Assessment/Plan   1. New onset atrial fibrillation with rapid ventricular response. 2. Hypertension. 3. Type 2 diabetes mellitus. 4. Obesity.  Plan: 1. Admit to step down unit. 2. Continue with Cardizem drip. 3. Echocardiogram. 4. Serial cardiac enzymes. Check TSH. 5. Cardiology consultation if patient continues to have atrial fibrillation over the weekend. 6. Prophylactic heparin dosing at the present time. If the atrial fibrillation continues, then she will need to be anticoagulated at a therapeutic dose.  Code Status: Full code.   Family Communication: Discussed plan with patient and her  husband at the bedside.   Disposition Plan: Home in medically stable.   Time spent: 30 minutes.  Wilson Singer [facility] Pager [phone].  If 7PM-7AM, please contact night-coverage [email or web address] Password [email or web address] 05/22/2012, 1:26 PM

## 2012-05-23 DIAGNOSIS — E119 Type 2 diabetes mellitus without complications: Secondary | ICD-10-CM

## 2012-05-23 LAB — COMPREHENSIVE METABOLIC PANEL
ALT: 27 U/L (ref 0–35)
Alkaline Phosphatase: 89 U/L (ref 39–117)
CO2: 26 mEq/L (ref 19–32)
Calcium: 9.8 mg/dL (ref 8.4–10.5)
Chloride: 98 mEq/L (ref 96–112)
GFR calc Af Amer: >90 mL/min (ref >90)
GFR calc non Af Amer: >90 mL/min (ref >90)
Glucose, Bld: 98 mg/dL (ref 70–99)
Sodium: 134 mEq/L — ABNORMAL LOW (ref 135–145)
Total Bilirubin: 0.2 mg/dL — ABNORMAL LOW (ref 0.3–1.2)

## 2012-05-23 LAB — CBC
MCH: 28.3 pg (ref 26.0–34.0)
MCHC: 33.7 g/dL (ref 30.0–36.0)
Platelets: 204 10*3/uL (ref 150–400)
RDW: 14.1 % (ref 11.5–15.5)

## 2012-05-23 LAB — GLUCOSE, CAPILLARY
Glucose-Capillary: 97 mg/dL (ref 70–99)
Glucose-Capillary: 93 mg/dL (ref 70–99)

## 2012-05-23 MED ORDER — METOPROLOL SUCCINATE ER 50 MG PO TB24: 50.0000 mg | ORAL_TABLET | Freq: Two times a day (BID) | ORAL | Status: DC

## 2012-05-23 MED ORDER — POTASSIUM CHLORIDE CRYS ER 20 MEQ PO TBCR
40.0000 meq | EXTENDED_RELEASE_TABLET | Freq: Once | ORAL | Status: AC
  Administered 2012-05-23: 40 meq via ORAL
  Filled 2012-05-23: qty 2

## 2012-05-23 NOTE — Discharge Summary (Signed)
Physician Discharge Summary  Patient ID: Samantha Vega MRN: [id number] DOB/AGE: [birth year] 61 y.o.  Admit date: 05/22/2012 Discharge date: 05/23/2012  Discharge Diagnoses:  Principal Problem:  *Atrial fibrillation with RVR Active Problems:  HYPERTENSION  DM (diabetes mellitus), type 2  Obesity     Medication List     As of 05/23/2012  8:39 AM    STOP taking these medications         benazepril-hydrochlorthiazide 20-12.5 MG per tablet   Commonly known as: LOTENSIN HCT      ibuprofen 600 MG tablet   Commonly known as: ADVIL,MOTRIN      TAKE these medications         aspirin EC 81 MG tablet   Take 81 mg by mouth daily.      calcium-vitamin D 500 MG tablet   Take 1 tablet by mouth 2 (two) times daily.      [employer] SPECTRAVITE PO   Take 1 tablet by mouth daily.      cyclobenzaprine 10 MG tablet   Commonly known as: FLEXERIL   One tablet at bedtime , for muscle spasm      diazepam 5 MG tablet   Commonly known as: VALIUM   Take 5 mg by mouth at bedtime as needed. For spasms      metFORMIN 500 MG tablet   Commonly known as: GLUCOPHAGE   TAKE 1 TABLET BY MOUTH 2 TIMES DAILY WITH A MEAL.      metoprolol succinate 50 MG 24 hr tablet   Commonly known as: TOPROL-XL   Take 1 tablet (50 mg total) by mouth 2 (two) times daily. Dose increase      nabumetone 500 MG tablet   Commonly known as: RELAFEN   Take 1 tablet (500 mg total) by mouth 2 (two) times daily.      rosuvastatin 20 MG tablet   Commonly known as: CRESTOR   Take 1 tablet (20 mg total) by mouth at bedtime.      traMADol-acetaminophen 37.5-325 MG per tablet   Commonly known as: ULTRACET   Take 1-2 tablets by mouth every 4 (four) hours as needed for pain.            Discharge Orders    Future Appointments: Provider: Department: Dept Phone: Center:   05/25/2012 1:45 PM Matilde Haymaker, PT [place] [facility] [phone] None   05/27/2012 1:00 PM Matilde Haymaker, PT [place] [facility] [phone] None   06/01/2012 1:00 PM Juel Burrow, PTA [place] [phone] None   06/03/2012 1:45 PM Amy Kae Heller, PTA [facility] [phone] None   06/07/2012 1:00 PM Amy Burnard [employer] [facility] [phone] None   06/10/2012 1:45 PM Matilde Haymaker, PT  OUTPATIENT REHABILITATION [phone] None   09/23/2012 9:00 AM Kerri Perches, MD [facility] [phone] [facility]   05/05/2013 9:30 AM Vickki Hearing, MD [facility] [phone] [facility]     Future Orders Please Complete By Expires   Diet - low sodium heart healthy      Diet Carb Modified      Activity as tolerated - No restrictions         Follow-up Information    Follow up with Lula Bing, MD. In 1 week.   Contact information:   [address] [place] [address] [phone]        Disposition:  home  Discharged Condition:  stable   Consults:  none  Labs:   Results for orders placed during the hospital encounter of 05/22/12 (from the past 48 hour(s))  CBC WITH DIFFERENTIAL     Status: Normal   Collection Time   05/22/12 11:45 AM      Component Value Range Comment   WBC 6.1  4.0 - 10.5 K/uL    RBC 4.51  3.87 - 5.11 MIL/uL    Hemoglobin 13.2  12.0 - 15.0 g/dL    HCT 16.1  09.6 - 04.5 %    MCV 84.9  78.0 - 100.0 fL    MCH 29.3  26.0 - 34.0 pg    MCHC 34.5  30.0 - 36.0 g/dL    RDW 40.9  81.1 - 91.4 %    Platelets 223  150 - 400 K/uL    Neutrophils Relative 47  43 - 77 %    Neutro Abs 2.9  1.7 - 7.7 K/uL    Lymphocytes Relative 41  12 - 46 %    Lymphs Abs 2.5  0.7 - 4.0 K/uL    Monocytes Relative 7  3 - 12 %    Monocytes Absolute 0.4  0.1 - 1.0 K/uL    Eosinophils Relative 4  0 - 5 %    Eosinophils Absolute 0.2  0.0 - 0.7 K/uL    Basophils Relative 1  0 - 1 %    Basophils Absolute 0.0  0.0 - 0.1 K/uL   COMPREHENSIVE METABOLIC PANEL     Status:  Abnormal   Collection Time   05/22/12 11:45 AM      Component Value Range Comment   Sodium 136  135 - 145 mEq/L    Potassium 4.3  3.5 - 5.1 mEq/L    Chloride 99  96 - 112 mEq/L    CO2 28  19 - 32 mEq/L    Glucose, Bld 98  70 - 99 mg/dL    BUN 29 (*) 6 - 23 mg/dL    Creatinine, Ser 7.82  0.50 - 1.10 mg/dL    Calcium 95.6  8.4 - 10.5 mg/dL    Total Protein 8.0  6.0 - 8.3 g/dL    Albumin 3.8  3.5 - 5.2 g/dL    AST 26  0 - 37 U/L    ALT 33  0 - 35 U/L    Alkaline Phosphatase 100  39 - 117 U/L    Total Bilirubin 0.2 (*) 0.3 - 1.2 mg/dL    GFR calc non Af Amer 67 (*) >90 mL/min    GFR calc Af Amer 77 (*) >90 mL/min   TROPONIN I     Status: Normal   Collection Time   05/22/12 11:45 AM      Component Value Range Comment   Troponin I <0.30  <0.30 ng/mL   PROTIME-INR     Status: Normal   Collection Time   05/22/12 11:45 AM      Component Value Range Comment   Prothrombin Time 13.1  11.6 - 15.2 seconds    INR 1.00  0.00 - 1.49   APTT     Status: Normal   Collection Time   05/22/12 11:45 AM      Component Value Range Comment   aPTT 30  24 - 37 seconds   ETHANOL     Status: Normal   Collection Time   05/22/12 11:45 AM      Component Value Range Comment   Alcohol,  Ethyl (B) <11  0 - 11 mg/dL   MAGNESIUM     Status: Normal   Collection Time   05/22/12 11:45 AM      Component Value Range Comment   Magnesium 1.8  1.5 - 2.5 mg/dL   URINALYSIS, ROUTINE W REFLEX MICROSCOPIC     Status: Abnormal   Collection Time   05/22/12 11:55 AM      Component Value Range Comment   Color, Urine YELLOW  YELLOW    APPearance CLEAR  CLEAR    Specific Gravity, Urine <1.005 (*) 1.005 - 1.030    pH 7.0  5.0 - 8.0    Glucose, UA NEGATIVE  NEGATIVE mg/dL    Hgb urine dipstick NEGATIVE  NEGATIVE    Bilirubin Urine NEGATIVE  NEGATIVE    Ketones, ur NEGATIVE  NEGATIVE mg/dL    Protein, ur NEGATIVE  NEGATIVE mg/dL    Urobilinogen, UA 0.2  0.0 - 1.0 mg/dL    Nitrite NEGATIVE  NEGATIVE    Leukocytes, UA  NEGATIVE  NEGATIVE MICROSCOPIC NOT DONE ON URINES WITH NEGATIVE PROTEIN, BLOOD, LEUKOCYTES, NITRITE, OR GLUCOSE <1000 mg/dL.  URINE RAPID DRUG SCREEN (HOSP PERFORMED)     Status: Normal   Collection Time   05/22/12 11:55 AM      Component Value Range Comment   Opiates NONE DETECTED  NONE DETECTED    Cocaine NONE DETECTED  NONE DETECTED    Benzodiazepines NONE DETECTED  NONE DETECTED    Amphetamines NONE DETECTED  NONE DETECTED    Tetrahydrocannabinol NONE DETECTED  NONE DETECTED    Barbiturates NONE DETECTED  NONE DETECTED   D-DIMER, QUANTITATIVE     Status: Normal   Collection Time   05/22/12 12:00 PM      Component Value Range Comment   D-Dimer, Quant 0.27  0.00 - 0.48 ug/mL-FEU   TSH     Status: Normal   Collection Time   05/22/12 12:00 PM      Component Value Range Comment   TSH 0.833  0.350 - 4.500 uIU/mL   MRSA PCR SCREENING     Status: Normal   Collection Time   05/22/12  2:48 PM      Component Value Range Comment   MRSA by PCR NEGATIVE  NEGATIVE   GLUCOSE, CAPILLARY     Status: Normal   Collection Time   05/22/12  4:31 PM      Component Value Range Comment   Glucose-Capillary 79  70 - 99 mg/dL    Comment 1 Documented in Chart      Comment 2 Notify RN     TROPONIN I     Status: Normal   Collection Time   05/22/12  6:01 PM      Component Value Range Comment   Troponin I <0.30  <0.30 ng/mL   GLUCOSE, CAPILLARY     Status: Normal   Collection Time   05/22/12  9:16 PM      Component Value Range Comment   Glucose-Capillary 97  70 - 99 mg/dL   TROPONIN I     Status: Normal   Collection Time   05/23/12  4:17 AM      Component Value Range Comment   Troponin I <0.30  <0.30 ng/mL   COMPREHENSIVE METABOLIC PANEL     Status: Abnormal   Collection Time   05/23/12  4:17 AM      Component Value Range Comment   Sodium 134 (*) 135 - 145 mEq/L  Potassium 3.4 (*) 3.5 - 5.1 mEq/L DELTA CHECK NOTED   Chloride 98  96 - 112 mEq/L    CO2 26  19 - 32 mEq/L    Glucose, Bld 98  70 - 99  mg/dL    BUN 19  6 - 23 mg/dL    Creatinine, Ser 1.61  0.50 - 1.10 mg/dL    Calcium 9.8  8.4 - 09.6 mg/dL    Total Protein 7.1  6.0 - 8.3 g/dL    Albumin 3.5  3.5 - 5.2 g/dL    AST 19  0 - 37 U/L    ALT 27  0 - 35 U/L    Alkaline Phosphatase 89  39 - 117 U/L    Total Bilirubin 0.2 (*) 0.3 - 1.2 mg/dL    GFR calc non Af Amer >90  >90 mL/min    GFR calc Af Amer >90  >90 mL/min   CBC     Status: Abnormal   Collection Time   05/23/12  4:17 AM      Component Value Range Comment   WBC 6.1  4.0 - 10.5 K/uL    RBC 4.06  3.87 - 5.11 MIL/uL    Hemoglobin 11.5 (*) 12.0 - 15.0 g/dL    HCT 04.5 (*) 40.9 - 46.0 %    MCV 84.0  78.0 - 100.0 fL    MCH 28.3  26.0 - 34.0 pg    MCHC 33.7  30.0 - 36.0 g/dL    RDW 81.1  91.4 - 78.2 %    Platelets 204  150 - 400 K/uL   GLUCOSE, CAPILLARY     Status: Normal   Collection Time   05/23/12  7:32 AM      Component Value Range Comment   Glucose-Capillary 93  70 - 99 mg/dL    Comment 1 Documented in Chart      Comment 2 Notify RN       Diagnostics:  Dg Chest 1 View  05/22/2012  *RADIOLOGY REPORT*  Clinical Data: Shortness of breath.  CHEST - 1 VIEW  Comparison: Plain film chest 01/11/2004 and CT chest 01/12/2004.  Findings: Lung volumes are low but the lungs are clear.  Heart size upper normal.  No pneumothorax or effusion.  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  IMPRESSION: Low volume chest without acute abnormality.   Original Report Authenticated By: Holley Dexter, M.D.    Dg Thoracic Spine 2 View  05/03/2012  *RADIOLOGY REPORT*  Clinical Data: 61 year old female with pain.  THORACIC SPINE - 2 VIEW  Comparison: 01/12/2004.  Findings: Normal thoracic segmentation.  Stable vertebral height and alignment.  Relatively preserved disc spaces. Cervicothoracic junction alignment is within normal limits.  Grossly intact posterior ribs and grossly stable visualized thoracic visceral contours.  IMPRESSION: No acute osseous abnormality in  the thoracic spine.   Original Report Authenticated By: Erskine Speed, M.D.    Ct Head Wo Contrast  05/22/2012  *RADIOLOGY REPORT*  Clinical Data: New onset seizures.  Headaches.  CT HEAD WITHOUT CONTRAST  Technique:  Contiguous axial images were obtained from the base of the skull through the vertex without contrast.  Comparison: Head CT scan 03/16/2010.  Findings: There is no evidence of acute intracranial abnormality including infarct, hemorrhage, mass lesion, mass effect, midline shift or abnormal extra-axial fluid collection.  No hydrocephalus or pneumocephalus.  Cava septum pellucidum is noted.  IMPRESSION: Negative exam.   Original Report Authenticated By: Holley Dexter, M.D.  Dg Knee Ap/lat W/sunrise Left  05/06/2012   Left knee x-rays show moderate amount of arthrosis in the knee  The films appear to be inadequately are incorrectly marked   Dg Knee Ap/lat W/sunrise Right  05/06/2012  3 views left knee the films are marked as left knee and the films were  taken of the left knee although the canopy indicates right knee Pain  left knee knee   Left knee findings Physiologic valgus. Tibial spine peaking. Medial joint space narrowing Patella centered with medial and lateral osteophytes  Osteoarthritis mild       EKG: Atrial fibrillation with bifascicular block  Full Code   Hospital Course: See H&P for complete admission details. The patient is a 61 year old black female who presented with palpitations. She has no previous history of atrial fibrillation. In the emergency room, she was found to be in atrial fibrillation with rapid ventricular response. She was started on a Cardizem drip and admitted to the hospitalist service. She quickly converted to normal sinus rhythm. The Cardizem drip was stopped. She is feeling well and ready for discharge. I've increased her metoprolol to 50 mg twice daily. Stopped for now Cipro hydrochlorothiazide in order to avoid hypotension. She should followup with  Dr. Dietrich Pates as an outpatient to avoid hypotension.  Discharge Exam:  Blood pressure 103/58, pulse 65, temperature 97.9 F (36.6 C), temperature source Oral, resp. rate 20, height 5\' 5"  (1.651 m), weight 92.5 kg (203 lb 14.8 oz), SpO2 96.00%.  Telemetry shows normal sinus rhythm  Lungs clear to auscultation bilaterally without wheeze rhonchi or rales Cardiovascular regular rate rhythm without murmurs gallops rubs Abdomen soft nontender nondistended Extremities no clubbing cyanosis or  Signed: Angelice Piech L 05/23/2012, 8:39 AM

## 2012-05-23 NOTE — Progress Notes (Signed)
Notified MD of pt's EKG result of sinus brady. Instructed to d/c cardizem drip by MD.

## 2012-05-23 NOTE — Progress Notes (Signed)
Patient discharged to home with husband. Discharge instructions given regarding follow up appointments, signs and symptoms of infection, reasons to call 911, activity and diet. Pt was able to "teach back" information.

## 2012-05-25 ENCOUNTER — Ambulatory Visit (HOSPITAL_COMMUNITY)
Admission: RE | Admit: 2012-05-25 | Discharge: 2012-05-25 | Disposition: A | Discharge status: Home / Self Care | Payer: PRIVATE HEALTH INSURANCE | Source: Ambulatory Visit | Attending: Physical Therapy | Admitting: Physical Therapy

## 2012-05-25 NOTE — Progress Notes (Signed)
Physical Therapy Treatment Patient Details  Name: Samantha Vega MRN: [id number] Date of Birth: [birth year]  Today's Date: 05/25/2012 Time: 4696-2952 PT Time Calculation (min): 44 min Charges: 14' TE Visit#: 3  of 8   Re-eval: 06/18/12    Authorization:    Authorization Time Period:    Authorization Visit#:   of     Subjective: Symptoms/Limitations Symptoms: Pt reports that she is doing ok today.  she states that her pain is up a little from yesterday  Pain Assessment Currently in Pain?: Yes Pain Score:   3 Pain Location: Knee Pain Orientation: Right;Left  Precautions/Restrictions     Exercise/Treatments Mobility/Balance        Stretches   Aerobic   Machines for Strengthening   Plyometrics   Standing Heel Raises: 15 reps (w/o HHA) Heel Raises Limitations: toe raises 15 reps w/o HHA Knee Flexion: Both;10 reps;Limitations Knee Flexion Limitations: cueing for posture, 3# weight Lateral Step Up: Both;10 reps;Hand Hold: 1;Step Height: 4";Limitations Lateral Step Up Limitations: cueing for posture Forward Step Up: Both;10 reps;Step Height: 4";Hand Hold: 1 Functional Squat: 15 reps SLS: 3x30" BLE w/intermitted HHA Seated Other Seated Knee Exercises: heel roll outs 5x 10" Supine Bridges: Both;10 reps;Limitations Bridges Limitations: 5 sec holds Straight Leg Raises: Both;15 reps Other Supine Knee Exercises: windshield wipers x15 w/ cueing to RLE to improve range Sidelying Hip ABduction: Both;Limitations Hip ABduction Limitations: 12 reps Prone  Hip Extension: Both;Limitations Hip Extension Limitations: 12 reps Other Prone Exercises: B hip IR x15      Physical Therapy Assessment and Plan PT Assessment and Plan Clinical Impression Statement: Pt is mostly limited by decreaed core and hip weakness causing improper body mechanics to knees.  Requires multimodal cueing to improve posture and core activation to complete activities with significant weakness  noted to hip musculature.  PT Plan: Continue to focus on improving hip and core strength to decrease knee pain.  encourage appropriate posture with standing activities.  Add standing hip abd and rocker board.     Goals    Problem List Patient Active Problem List  Diagnosis  . HYPERLIPIDEMIA  . Unspecified visual loss  . HYPERTENSION  . Heart palpitations  . RBBB (right bundle branch block)  . Obesity  . DM (diabetes mellitus), type 2  . Knee pain, bilateral  . Thoracic back pain  . Acute right flank pain  . OA (osteoarthritis) of knee  . Atrial fibrillation with RVR    PT - End of Session Activity Tolerance: Patient tolerated treatment well General Behavior During Session: [facility] for tasks performed Cognition: [facility] for tasks performed  GP    Johnson Arizola 05/25/2012, 2:35 PM

## 2012-05-27 ENCOUNTER — Ambulatory Visit (HOSPITAL_COMMUNITY)
Admission: RE | Admit: 2012-05-27 | Discharge: 2012-05-27 | Disposition: A | Discharge status: Home / Self Care | Payer: PRIVATE HEALTH INSURANCE | Source: Ambulatory Visit | Attending: Family Medicine | Admitting: Family Medicine

## 2012-05-27 NOTE — Progress Notes (Signed)
Physical Therapy Treatment Patient Details  Name: Samantha Vega MRN: [id number] Date of Birth: 17-Jan-1951  Today's Date: 05/27/2012 Time: 1305-1352 PT Time Calculation (min): 47 min 8' gait 36'therex  Visit#: 4  of 8   Re-eval: 06/18/12    Authorization:    Authorization Time Period:    Authorization Visit#:   of     Subjective: Symptoms/Limitations Symptoms: Patient has no complaints today  Precautions/Restrictions     Exercise/Treatments   Standing Heel Raises: 15 reps Heel Raises Limitations: toe raises 15 reps 2 fingers Knee Flexion: Both;10 reps;Limitations Lateral Step Up: Both;Hand Hold: 1;Hand Hold: 0;Step Height: 4";Other (comment) (HHA for Left) Forward Step Up: Hand Hold: 0;Step Height: 4";Both Functional Squat: 15 reps SLS: 3x30" BLE w/intermitted HHA Gait Training: Gait training x 200' with cueing for equal stride length and to normalize gait velocity Seated   Supine Quad Sets: Both;10 reps Bridges: Both;10 reps;Limitations Bridges Limitations: 5 sec holds Straight Leg Raises: Both;15 reps Other Supine Knee Exercises: windshield wipers x15 w/ cueing to RLE to improve range Sidelying Hip ABduction: Both;15 reps Hip ADduction: Both;15 reps Prone  Hip Extension: Both;15 reps      Physical Therapy Assessment and Plan PT Assessment and Plan Clinical Impression Statement: Patient prone knee flexion Left AAROM 124/AROM 114      Right  AAROM 120 AROM 110degrees.  Patient continues to require verbal/tactile cueing for proper posture/technique on most exercises. Gait needed corrections for increasing L step length. Continue with PT POC    Goals    Problem List Patient Active Problem List  Diagnosis  . HYPERLIPIDEMIA  . Unspecified visual loss  . HYPERTENSION  . Heart palpitations  . RBBB (right bundle branch block)  . Obesity  . DM (diabetes mellitus), type 2  . Knee pain, bilateral  . Thoracic back pain  . Acute right flank pain  . OA  (osteoarthritis) of knee  . Atrial fibrillation with RVR    PT - End of Session Activity Tolerance: Patient tolerated treatment well General Behavior During Session: [facility] for tasks performed Cognition: [facility] for tasks performed  GP    Fe Okubo ATKINSO 05/27/2012, 2:08 PM

## 2012-06-01 ENCOUNTER — Ambulatory Visit (HOSPITAL_COMMUNITY)
Admission: RE | Admit: 2012-06-01 | Discharge: 2012-06-01 | Disposition: A | Discharge status: Home / Self Care | Payer: PRIVATE HEALTH INSURANCE | Source: Ambulatory Visit | Attending: Family Medicine | Admitting: Family Medicine

## 2012-06-01 NOTE — Progress Notes (Signed)
Physical Therapy Treatment Patient Details  Name: Samantha Vega MRN: [id number] Date of Birth: 05-Dec-1950  Today's Date: 06/01/2012 Time: 1307-1400 PT Time Calculation (min): 53 min  Visit#: 5  of 8   Re-eval: 06/18/12  Charge: therex 53'  Subjective: Symptoms/Limitations Symptoms: Pt stated no pain in knees today. Pain Assessment Currently in Pain?: No/denies  Objective:   Exercise/Treatments Standing Heel Raises: 15 reps Heel Raises Limitations: toe raises 15 reps intermittent 2 finger HHA Knee Flexion: Both;15 reps;Limitations Knee Flexion Limitations: cueing for posture, 3# weight Functional Squat: 15 reps;Limitations Functional Squat Limitations: with chair behind to improve form with squat Rocker Board: 2 minutes;Limitations Rocker Board Limitations: R/L with intermittent HHA Other Standing Knee Exercises: BLE hip abd/ ext 10x with tactile cueing for posture Seated Other Seated Knee Exercises: heel roll outs 8x 10"; stress ball between knees  Supine Bridges: Both;10 reps;Limitations Bridges Limitations: 5 sec holds with orange ball between knees for add strengthening Straight Leg Raises: Both;15 reps Other Supine Knee Exercises: windshield wipers x15 w/ cueing to RLE to improve range Prone  Hip Extension: Both;15 reps Other Prone Exercises: B hip IR x15      Physical Therapy Assessment and Plan PT Assessment and Plan Clinical Impression Statement: Pt continues to require multimodal cueing to improve posture with standing and sitting exercises due to weak core and hip musculature.  Added standing hip abd and ext for glut strengthening with tactile cueing for posture and vc-ing to activate core musculature.  Also added rockerboard  to improve weight distribution and balance with improve gait mechanics noted following activity.  Pt does continue to require cueing to reduce ER with standing exercises and gait. PT Plan: Continue to focus on improving hip and core  strength to decrease knee pain. encourage appropriate posture with standing activities and cueing to reduce ER.  Resume stair training next session.      Goals    Problem List Patient Active Problem List  Diagnosis  . HYPERLIPIDEMIA  . Unspecified visual loss  . HYPERTENSION  . Heart palpitations  . RBBB (right bundle branch block)  . Obesity  . DM (diabetes mellitus), type 2  . Knee pain, bilateral  . Thoracic back pain  . Acute right flank pain  . OA (osteoarthritis) of knee  . Atrial fibrillation with RVR    PT - End of Session Activity Tolerance: Patient tolerated treatment well General Behavior During Session: [facility] for tasks performed Cognition: [facility] for tasks performed  GP    Juel Burrow 06/01/2012, 2:03 PM

## 2012-06-02 ENCOUNTER — Encounter: Payer: Self-pay | Admitting: Physician Assistant

## 2012-06-02 ENCOUNTER — Ambulatory Visit (INDEPENDENT_AMBULATORY_CARE_PROVIDER_SITE_OTHER): Payer: PRIVATE HEALTH INSURANCE | Admitting: Physician Assistant

## 2012-06-02 VITALS — BP 100/70 | HR 76 | Ht 61.5 in | Wt 205.0 lb

## 2012-06-02 DIAGNOSIS — I4891 Unspecified atrial fibrillation: Secondary | ICD-10-CM

## 2012-06-02 DIAGNOSIS — I10 Essential (primary) hypertension: Secondary | ICD-10-CM

## 2012-06-02 MED ORDER — RIVAROXABAN 20 MG PO TABS: 20.0000 mg | ORAL_TABLET | Freq: Every day | ORAL | Status: DC

## 2012-06-02 NOTE — Assessment & Plan Note (Addendum)
Patient has a long history of palpitations but this is the first time she's had documented atrial fibrillation. She has had no further palpitations since discharge. I discussed this patient in detail with Dr. Erie Bing who recommends beginning Xarelto.her creatinine clearance is 52 so we'll we'll start 20 mg daily. She will verify what medication she is taking currently.She will follow up with Dr. Dietrich Pates and Vashti Hey in 3 months.

## 2012-06-02 NOTE — Addendum Note (Signed)
Addended by: Derry Lory A on: 06/02/2012 01:46 PM   Modules accepted: Orders

## 2012-06-02 NOTE — Assessment & Plan Note (Signed)
Blood pressure is on a low side today. Her metoprolol was increased in the hospital. She was supposed to stop benazepril- hydrochlorothiazide but she isn't sure she did this. I recommend she stop this if she is taking it. She will verify this.

## 2012-06-02 NOTE — Patient Instructions (Addendum)
Your physician recommends that you schedule a follow-up appointment in: 3 MONTHS WITH RR AND LISA REID FOR CHECK ON XARELTO  Your physician has recommended you make the following change in your medication:   1) START TAKING XARELTO 20MG  DAILY 2) STOP BENAZAPRIL/HCTZ (LOTENSIN) IF YOU ARE STILL TAKING  WE WILL HAVE YOU COME INTO THE OFFICE FOR A NURSE VISIT TO VERIFY YOUR MEDICATIONS, PLEASE BRING ALL BOTTLES WITH YOU THAT YOU ARE CURRENTLY TAKING

## 2012-06-02 NOTE — Progress Notes (Signed)
HPI:  This is a 61 year old African American female patient of Dr. Donnamarie Rossetti who has a long history of palpitations but no other arrhythmias were ever documented. She presented to the emergency room on 05/22/12 and was found to be in rapid atrial fibrillation. She was treated with IV Cardizem and converted to sinus bradycardia. She was sent home on metoprolol 50 mg b.i.d.. She was told to stop her Lotensin--hydrochlorothiazide. She did not bring her medications in with her today and she's not sure what she is taking. She said she did come back with all her medications.She has a history hypertension, hyperlipidemia, diabetes mellitus, and obesity.  Since the emergency room visit the patient denies any palpitations, chest pain, shortness of breath, dizziness, or syncope she is feeling pretty good.  No Known Allergies  Current Outpatient Prescriptions on File Prior to Visit: aspirin EC 81 MG tablet, Take 81 mg by mouth daily., Disp: , Rfl:  calcium-vitamin D (OSCAL 500/200 D-3) 500 MG tablet, Take 1 tablet by mouth 2 (two) times daily. , Disp: , Rfl:  cyclobenzaprine (FLEXERIL) 10 MG tablet, One tablet at bedtime , for muscle spasm, Disp: 30 tablet, Rfl: 0 diazepam (VALIUM) 5 MG tablet, Take 5 mg by mouth at bedtime as needed. For spasms, Disp: , Rfl:  metFORMIN (GLUCOPHAGE) 500 MG tablet, TAKE 1 TABLET BY MOUTH 2 TIMES DAILY WITH A MEAL., Disp: 180 tablet, Rfl: 1 metoprolol succinate (TOPROL-XL) 50 MG 24 hr tablet, Take 1 tablet (50 mg total) by mouth 2 (two) times daily. Dose increase, Disp: 60 tablet, Rfl: 0 Multiple Vitamins-Minerals ([employer] SPECTRAVITE PO), Take 1 tablet by mouth daily., Disp: , Rfl:  nabumetone (RELAFEN) 500 MG tablet, Take 1 tablet (500 mg total) by mouth 2 (two) times daily., Disp: 60 tablet, Rfl: 5 rosuvastatin (CRESTOR) 20 MG tablet, Take 1 tablet (20 mg total) by mouth at bedtime., Disp: 90 tablet, Rfl: 1    Past Medical History:   Right bundle branch block                                       Comment:+Palpitations.   EKG 11/2006:  NSR, RBBB, LAFB,               markedly delayed R-wave progression, no change;              Echocardiogram in 10/2006-suboptimal quality,               normal; Event recorder-PVCs, no significant               arrhythmias or symptoms ;   Obesity                                                      Hyperlipidemia                                               Hypertension  Diabetes mellitus                                            Chest pain                                                     Comment:Noncardiac; evaluated and treated in 2008   Enlarged heart                                              Past Surgical History:   TOTAL ABDOMINAL HYSTERECTOMY W/ BILATERAL SALP*              SHOULDER SURGERY                                2006           Comment:Left-post-trauma  Review of patient's family history indicates:   Hypertension                   Mother                     Comment: and sister   Arthritis                      Mother                   Stroke                         Father                   Hypertension                   Father                   Diabetes                       Sister                     Comment: x1   Sarcoidosis                    Sister                     Comment: x1   Heart failure                  Brother                  Coronary artery disease        Father                   Social History   Marital Status: Married             Spouse Name:  Years of Education:                 Number of children: 0           Occupational History Occupation          Government social research officer   [employer]             Social History Main Topics   Smoking Status: Never Smoker                     Smokeless Status: Never Used                       Alcohol Use: No             Drug Use: No              Sexual Activity: Not on file        Other Topics            Concern   None on file  Social History Narrative   None on file    ROS:see history of present illness otherwise negative she takes medicine for arthritis but isn't sure what she is on.   PHYSICAL EXAM: Obese, in no acute distress. Neck: No JVD, HJR, Bruit, or thyroid enlargement  Lungs: No tachypnea, clear without wheezing, rales, or rhonchi  Cardiovascular: RRR, PMI not displaced, heart sounds normal, no murmurs, gallops, bruit, thrill, or heave.  Abdomen: BS normal. Soft without organomegaly, masses, lesions or tenderness.  Extremities: without cyanosis, clubbing or edema. Good distal pulses bilateral  SKin: Warm, no lesions or rashes   Musculoskeletal: No deformities  Neuro: no focal signs  BP 100/70  Pulse 76  Ht 5' 1.5" (1.562 m)  Wt 205 lb (92.987 kg)  BMI 38.11 kg/m2  SpO2 96%   WUJ:WJXBJY sinus rhythm with right bundle-branch block

## 2012-06-03 ENCOUNTER — Ambulatory Visit (HOSPITAL_COMMUNITY)
Admission: RE | Admit: 2012-06-03 | Discharge: 2012-06-03 | Disposition: A | Discharge status: Home / Self Care | Payer: PRIVATE HEALTH INSURANCE | Source: Ambulatory Visit | Attending: Family Medicine | Admitting: Family Medicine

## 2012-06-03 ENCOUNTER — Ambulatory Visit (INDEPENDENT_AMBULATORY_CARE_PROVIDER_SITE_OTHER): Payer: PRIVATE HEALTH INSURANCE | Admitting: *Deleted

## 2012-06-03 DIAGNOSIS — I10 Essential (primary) hypertension: Secondary | ICD-10-CM

## 2012-06-03 NOTE — Patient Instructions (Addendum)
PLEASE NOTE A COPY OF INSTRUCTIONS FROM RECENT OFFICE VISIT COPIED TO CURRENT OFFICE VISIT INSTRUCTIONS HAVE BEEN ENCLOSED   Your physician recommends that you schedule a follow-up appointment in: 3 MONTHS WITH RR AND LISA REID FOR CHECK ON XARELTO  Your physician has recommended you make the following change in your medication:  1) START TAKING XARELTO 20MG  DAILY  2) STOP BENAZAPRIL/HCTZ (LOTENSIN) IF YOU ARE STILL TAKING  WE WILL HAVE YOU COME INTO THE OFFICE FOR A NURSE VISIT TO VERIFY YOUR MEDICATIONS, PLEASE BRING ALL BOTTLES WITH YOU THAT YOU ARE CURRENTLY TAKING    3)REMEMBER DO NOT TAKE THE NABUMETONE(RELAFEN) WHILE ALSO TAKING THE IBUPROFEN 4)YOU CAN FINISH YOUR BOTTLE OF 25MG  METORPROLOL SUCCINATE ER BY TAKING 2 TABS DAILY UNTIL FINISHED PER NEW DOSAGE OF 50MG  CHANGE, DO NOT TAKE WITH THE 50MG  TABLETS OF METOPROLOL SUCCINATE ER UNTIL BOTTLE OF 25MG  HAS BEEN COMPLETED 5) ONCE YOU COMPLETE THE METOPROLOL SUCCINATE 25MG , THROW EMPTY BOTTLE AWAY AND START TAKING 50MG  METOPROLOL SUCCINATE ER ONE TABLET DAILY  ANY FURTHER QUESTIONS DO NOT HESITATE TO CONTACT OUR OFFICE WITH QUESTIONS [phone]

## 2012-06-03 NOTE — Progress Notes (Signed)
Physical Therapy Treatment Patient Details  Name: Samantha Vega MRN: [id number] Date of Birth: October 01, 1950  Today's Date: 06/03/2012 Time: 4098-1191 PT Time Calculation (min): 40 min Visit#: 6  of 8   Re-eval: 06/18/12 Charges:  therex 38'  Subjective: Symptoms/Limitations Symptoms: Pt. states her knees are hurting a little today, L > R at 3/10. Pain Assessment Currently in Pain?: Yes Pain Score:   3 Pain Location: Knee Pain Orientation: Right;Left   Exercise/Treatments Standing Heel Raises: 15 reps Heel Raises Limitations: toe raises 15 reps intermittent 2 finger HHA Knee Flexion: Both;15 reps;Limitations Knee Flexion Limitations: cueing for posture, 3# weight Functional Squat: 15 reps Stairs: 1 flight, reciprocally; 1 HR down, no HR up Rocker Board: 2 minutes;Limitations Rocker Board Limitations: R/L with intermittent HHA Other Standing Knee Exercises: BLE hip abd/ ext 10x with tactile cueing for posture Seated Other Seated Knee Exercises: heel roll outs 10x 10"; stress ball between knees  Supine Bridges: 15 reps Bridges Limitations: 5 sec holds with orange ball between knees for add strengthening Straight Leg Raises: Both;15 reps Other Supine Knee Exercises: windshield wipers x15 w/ cueing to RLE to improve range Sidelying Hip ABduction: Both;15 reps Prone  Hip Extension: Both;15 reps Other Prone Exercises: B hip IR x15     Physical Therapy Assessment and Plan PT Assessment and Plan Clinical Impression Statement: Pt. able to complete more reps today with mat activties; able to complete standing exercises despite increased pain today.  Pt. reported slight pain when descending stairs reciprocally having to use 1 HR, no difficulty ascending.  Pain overall decreased at end of session, however L still hurting worse. PT Plan: Continue to focus on improving hip and core strength to decrease knee pain. encourage appropriate posture with standing activities and cueing  to reduce ER.  Resume stair training next session.       Problem List Patient Active Problem List  Diagnosis  . HYPERLIPIDEMIA  . Unspecified visual loss  . HYPERTENSION  . Heart palpitations  . RBBB (right bundle branch block)  . Obesity  . DM (diabetes mellitus), type 2  . Knee pain, bilateral  . Thoracic back pain  . Acute right flank pain  . OA (osteoarthritis) of knee  . Atrial fibrillation with RVR    PT - End of Session Activity Tolerance: Patient tolerated treatment well General Behavior During Session: [facility] for tasks performed Cognition: [facility] for tasks performed   Lurena Nida, PTA/CLT 06/03/2012, 2:30 PM

## 2012-06-03 NOTE — Progress Notes (Signed)
Pt presented with her medication bottles to verify what she is taking currently  Corrections made to medication list based on information provided to pt, pt noted advised she is no longer taking the flexeril, nor valium listed in her chart,   Reiterated to pt to STOP taking Benazapril-HCTZ 20-12.5mg  per ML directions noted at OV yesterday 06-02-12, as well offered to destroy medication brought into office with her to limit confusion, pt declined, this nurse wrote the word STOP on the top and side of this medication bottle with sharpie, pt advised she will take the bottle to her PCP at her next OV to alert them of the change, advised pt the notations and medication changes have also been forwarded to her PCP via email to alert PCP, pt voiced understanding of all medication instructions  Pt advised she lost her instructions from ML office visit yesterday, copied instructions in addition to todays instructions and given to pt as discharge summary, pt voice understanding of all instructions  Pt was given instructions to NOT take the Nabumetone/relafen while taking the recent addition of Ibuprofen 600mg  TID/PRN provied by MD Mort Sawyers on 05-21-12, pt advised that she is not taking the nabumetone as instructed by pharmacy instructions enclosed with pt refill  Pt was also given instructions to finish out 25mg  Metoprolol succinate ER 2 tabs daily per recent change to 50mg   Advised pt to call office with any further questions and number provided in discharge summary as well as this nurse personal card

## 2012-06-07 ENCOUNTER — Ambulatory Visit (HOSPITAL_COMMUNITY)
Admission: RE | Admit: 2012-06-07 | Discharge: 2012-06-07 | Disposition: A | Discharge status: Home / Self Care | Payer: PRIVATE HEALTH INSURANCE | Source: Ambulatory Visit | Attending: Family Medicine | Admitting: Family Medicine

## 2012-06-07 NOTE — Progress Notes (Signed)
Physical Therapy Treatment Patient Details  Name: Samantha Vega MRN: [id number] Date of Birth: [birth year]  Today's Date: 06/07/2012 Time: 1302-1345 PT Time Calculation (min): 43 min 41' therex  Visit#: 7  of 8   Re-eval: 06/18/12    Authorization:    Authorization Time Period:    Authorization Visit#:   of     Subjective: Symptoms/Limitations Symptoms: No complaints of either knee, however patient did take pain pill this morning due to B knee pain of 2-3/10. Patient does report improvement with knee pain since therapy began.  Precautions/Restrictions     Exercise/Treatments  Heel Raises: 2 sets;10 reps Heel Raises Limitations: toe raises 2x10 reps intermittent 2 finger HHA Knee Flexion: 2 sets;10 reps;Limitations Knee Flexion Limitations: cueing for posture, 3# weight Functional Squat: 15 reps Stairs: 1 flight, reciprocally; 1 HR down, no HR up Rocker Board: 2 minutes;Limitations Rocker Board Limitations: R/L with intermittent HHA Other Standing Knee Exercises: BLE hip abd/ ext 15x with tactile cueing for posture Seated   Supine Bridges: 15 reps Bridges Limitations: 5 sec holds with orange ball between knees for add strengthening Straight Leg Raises: Both;15 reps Other Supine Knee Exercises: windshield wipers x15 w/ cueing to RLE to improve range Sidelying Hip ABduction: Both;15 reps;Other (comment) (2#) Prone  Hip Extension: Both;15 reps (2#) Other Prone Exercises: B hip IR x15      Physical Therapy Assessment and Plan PT Assessment and Plan Clinical Impression Statement: Patient able to complete exercises with weights added, squats causing some pain in L knee today, however no complaints during stair training; 1 HR.Continue to focus on improving hip and core strength to decrease knee pain. encourage appropriate posture with standing activities and cueing to reduce ER PT Plan: Continue to focus on improving hip and core strength to decrease knee pain.  encourage appropriate posture with standing activities and cueing to reduce ER    Goals    Problem List Patient Active Problem List  Diagnosis  . HYPERLIPIDEMIA  . Unspecified visual loss  . HYPERTENSION  . Heart palpitations  . RBBB (right bundle branch block)  . Obesity  . DM (diabetes mellitus), type 2  . Knee pain, bilateral  . Thoracic back pain  . Acute right flank pain  . OA (osteoarthritis) of knee  . Atrial fibrillation with RVR    PT - End of Session Equipment Utilized During Treatment: Gait belt Activity Tolerance: Patient tolerated treatment well General Behavior During Session: [facility] for tasks performed Cognition: [facility] for tasks performed  GP    Alexandar Weisenberger ATKINSO 06/07/2012, 2:00 PM

## 2012-06-10 ENCOUNTER — Ambulatory Visit (HOSPITAL_COMMUNITY)
Admission: RE | Admit: 2012-06-10 | Discharge: 2012-06-10 | Disposition: A | Discharge status: Home / Self Care | Payer: PRIVATE HEALTH INSURANCE | Source: Ambulatory Visit | Attending: Family Medicine | Admitting: Family Medicine

## 2012-06-10 NOTE — Evaluation (Signed)
Physical Therapy Re-evaluation  Patient Details  Name: Samantha Vega MRN: [id number] Date of Birth: [birth year]  Today's Date: 06/10/2012 Time: 7829-5621 PT Time Calculation (min): 42 min  Visit#: 8  of 16   Re-eval: 07/08/12 Charges:  MMT, ROM testing, PPT, [place] 8'   Subjective Symptoms/Limitations Symptoms: Pt. states she takes 1-2 pain pills a day due to her knee pain.  Pt. reports pain varies between 2-6/10; never painfree but never enough to go to ED.  Pain Assessment Currently in Pain?: Yes Pain Score:   2 Pain Orientation: Right;Left  Sensation/Coordination/Flexibility/Functional Tests Functional Tests Functional Tests: LEFS: 52/80 (was 37/80)   Objective: RLE PROM (degrees) Right Knee Flexion: 110  (was 115 degrees, measured in prone with  increased pain )  RLE Strength RLE Overall Strength Comments: Hip IR and ER 5/5 (hip IR and ER were 3/5) Right Hip Flexion: 5/5 (was 4/5) Right Hip Extension: 4/5 (was 3+/5) Right Hip ABduction: 5/5 (was 4/5) Right Hip ADduction: 3/5 (was 4/5) Right Knee Flexion: 5/5 (was 5/5) Right Knee Extension: 5/5 (was 5/5)  LLE PROM (degrees) Left Knee Flexion: 110 (was 110 degrees in prone with increased pain)  LLE Strength LLE Overall Strength Comments: Hip IR and ER 3+/5 (Hip IR and ER was 3/5) Left Hip Flexion: 5/5 (was 3+/5) Left Hip Extension: 4/5 (was 3+/5) Left Hip ABduction: 5/5 (was 4/5) Left Hip ADduction: 3+/5 (was 4/5) Left Knee Flexion: 4/5 (was 5/5) Left Knee Extension: 4/5 (was 5/5)  Mobility/Balance  Ambulation/Gait Ambulation/Gait: Yes Gait Pattern: Antalgic (Toe out) Static Standing Balance Single Leg Stance - Right Leg: 5  (was 5 seconds) Single Leg Stance - Left Leg: 5  (was 3 seconds) Tandem Stance - Right Leg: 30  (was 10 seconds) Tandem Stance - Left Leg: 30  (was 6 seconds) Rhomberg - Eyes Opened: 10  (was 10 seconds)   Exercise/Treatments Modalities Modalities:  Ultrasound Ultrasound Ultrasound Location: B patellar tendons Ultrasound Parameters: 4' each (8' total) pulsed 0.8w/cm2; 3 MHz Ultrasound Goals: Pain  Physical Therapy Assessment and Plan PT Assessment and Plan Clinical Impression Statement: Re-eval completed today with overall improvments noted.  Increase of 15 points with LEFS with LE strength gains.  Pain continues to be greatest deficit.  Pt would benefit form further  therapy to meet all goals.   Plan:  Continue OP therapy 2X week for 4 more weeks per PT POC.  Will include modalities to address further pain goals.   Goals Home Exercise Program Pt will Perform Home Exercise Program: Independently: Progressing towards  PT Short Term Goals Time to Complete Short Term Goals: 2 weeks PT Short Term Goal 1: Pt will report pain less than 1/10 while walking.  PT Short Term Goal 1 - Progress: Progressing toward goal (at least 2/10) PT Short Term Goal 2: Pt will improve hip strength by 1 muscle grade.  PT Short Term Goal 2 - Progress: Progressing toward goal PT Short Term Goal 3: Pt will improve B knee flexibility to 120 degrees in prone without pain.  PT Short Term Goal 3 - Progress: Progressing toward goal PT Short Term Goal 4: Pt will demonstrate R and L SLS x15 sec on solid surface.  PT Short Term Goal 4 - Progress: Progressing toward goal  PT Long Term Goals Time to Complete Long Term Goals: 4 weeks PT Long Term Goal 1: Pt will improve LE strength in order to tolerate ambulating 30 minutes to continue with exercise for weight loss.  PT Long Term Goal  1 - Progress: Met PT Long Term Goal 2: Pt will improve dyanmic balance and demonstrate tandem gait on dynamic surface.  PT Long Term Goal 2 - Progress: Progressing toward goal Long Term Goal 3: Pt will improve her LEFS to 50/80 for improved QOL.  Long Term Goal 3 Progress: Met (52/80)  Problem List Patient Active Problem List  Diagnosis  . HYPERLIPIDEMIA  . Unspecified visual loss   . HYPERTENSION  . Heart palpitations  . RBBB (right bundle branch block)  . Obesity  . DM (diabetes mellitus), type 2  . Knee pain, bilateral  . Thoracic back pain  . Acute right flank pain  . OA (osteoarthritis) of knee  . Atrial fibrillation with RVR    PT - End of Session Equipment Utilized During Treatment: Gait belt Activity Tolerance: Patient tolerated treatment well General Behavior During Session: [facility] for tasks performed Cognition: [facility] for tasks performed   Lurena Nida, PTA/CLT 06/10/2012, 5:48 PM

## 2012-06-17 ENCOUNTER — Ambulatory Visit (HOSPITAL_COMMUNITY)
Admission: RE | Admit: 2012-06-17 | Payer: PRIVATE HEALTH INSURANCE | Source: Ambulatory Visit | Attending: Family Medicine | Admitting: Family Medicine

## 2012-06-18 ENCOUNTER — Ambulatory Visit (HOSPITAL_COMMUNITY)
Admission: RE | Admit: 2012-06-18 | Discharge: 2012-06-18 | Disposition: A | Discharge status: Home / Self Care | Payer: PRIVATE HEALTH INSURANCE | Source: Ambulatory Visit | Attending: Family Medicine | Admitting: Family Medicine

## 2012-06-18 DIAGNOSIS — IMO0001 Reserved for inherently not codable concepts without codable children: Secondary | ICD-10-CM | POA: Insufficient documentation

## 2012-06-18 DIAGNOSIS — M25569 Pain in unspecified knee: Secondary | ICD-10-CM | POA: Insufficient documentation

## 2012-06-18 DIAGNOSIS — I10 Essential (primary) hypertension: Secondary | ICD-10-CM | POA: Insufficient documentation

## 2012-06-18 DIAGNOSIS — M6281 Muscle weakness (generalized): Secondary | ICD-10-CM | POA: Insufficient documentation

## 2012-06-18 DIAGNOSIS — E119 Type 2 diabetes mellitus without complications: Secondary | ICD-10-CM | POA: Insufficient documentation

## 2012-06-18 NOTE — Progress Notes (Addendum)
Physical Therapy Treatment Patient Details  Name: Samantha Vega MRN: [id number] Date of Birth: [birth year]  Today's Date: 06/18/2012 Time: 4098-1191 PT Time Calculation (min): 58 min  Visit#: 9  of 16   Re-eval: 07/08/12 Assessment Diagnosis: B knee OA Next MD Visit: Dr.Harrison -  Charge: therex 40', [place] 8', self care (educated on importance of continuing HEP and complete LEFS)  Subjective: Symptoms/Limitations Symptoms: Pt stated both knees are feeling good today, thinks today can be the last day of therapy.  Pt reported the [place] did help reduce pain last session, would like to end with that today.   How long can you stand comfortably?: 1 hour comfortably How long can you walk comfortably?: couple houirs/ Pain Assessment Currently in Pain?: Yes Pain Score:   2 Pain Location: Knee Pain Orientation: Right;Left  Objective: LEFS 70/80 (was 52/80 last assessed)  Exercise/Treatments Standing Heel Raises: 20 reps;Limitations Heel Raises Limitations: toe raises 20x  no HHA Knee Flexion: 20 reps;Limitations Knee Flexion Limitations: 3# Functional Squat: 20 reps Stairs: 1 flight, reciprocally; 1 HR down, no HR up Rocker Board: 2 minutes;Limitations Rocker Board Limitations: R/L with intermittent HHA SLS: 3x30" BLE w/intermitted HHA Other Standing Knee Exercises: BLE hip abd/ ext 20x with tactile cueing for posture Other Standing Knee Exercises: tandem stance 2x 30", tandem gait 1RT Supine Bridges: 15 reps Straight Leg Raises: Both;15 reps Sidelying Hip ABduction: 15 reps;Limitations Hip ABduction Limitations: 2#   Modalities Modalities: Ultrasound Ultrasound Ultrasound Location: B patellar tendons Ultrasound Parameters: 4' each (8' total) pulsed 0.8w/cm2; 3 MHz Ultrasound Goals: Pain  Physical Therapy Assessment and Plan PT Assessment and Plan Clinical Impression Statement: Pt with improved perceived lower extremity functional ability scale with 18 point gain.  Pt  reported she has increased her frequency with HEP and required less cueing for form with exercises.  Ended session with [place] B patella tendon with pain relief stated at end of session. PT Plan: D/C to HEP per pt. request.    Goals    Problem List Patient Active Problem List  Diagnosis  . HYPERLIPIDEMIA  . Unspecified visual loss  . HYPERTENSION  . Heart palpitations  . RBBB (right bundle branch block)  . Obesity  . DM (diabetes mellitus), type 2  . Knee pain, bilateral  . Thoracic back pain  . Acute right flank pain  . OA (osteoarthritis) of knee  . Atrial fibrillation with RVR    PT - End of Session Activity Tolerance: Patient tolerated treatment well General Behavior During Session: [facility] for tasks performed Cognition: [facility] for tasks performed  GP   Juel Burrow, PTA 06/18/2012, 12:52 PM

## 2012-06-22 ENCOUNTER — Inpatient Hospital Stay (HOSPITAL_COMMUNITY)
Admission: RE | Admit: 2012-06-22 | Payer: PRIVATE HEALTH INSURANCE | Source: Ambulatory Visit | Admitting: Physical Therapy

## 2012-06-24 ENCOUNTER — Ambulatory Visit (HOSPITAL_COMMUNITY): Payer: PRIVATE HEALTH INSURANCE | Admitting: Physical Therapy

## 2012-06-28 ENCOUNTER — Ambulatory Visit (HOSPITAL_COMMUNITY): Payer: PRIVATE HEALTH INSURANCE | Admitting: Physical Therapy

## 2012-06-30 ENCOUNTER — Other Ambulatory Visit: Payer: Self-pay | Admitting: Family Medicine

## 2012-07-01 ENCOUNTER — Ambulatory Visit (HOSPITAL_COMMUNITY): Payer: PRIVATE HEALTH INSURANCE | Admitting: Physical Therapy

## 2012-07-11 ENCOUNTER — Inpatient Hospital Stay (HOSPITAL_COMMUNITY)
Admission: EM | Admit: 2012-07-11 | Discharge: 2012-07-14 | DRG: 309 | Disposition: A | Discharge status: Home / Self Care | Payer: PRIVATE HEALTH INSURANCE | Attending: Internal Medicine | Admitting: Internal Medicine

## 2012-07-11 ENCOUNTER — Emergency Department (HOSPITAL_COMMUNITY): Payer: PRIVATE HEALTH INSURANCE

## 2012-07-11 ENCOUNTER — Encounter (HOSPITAL_COMMUNITY): Payer: Self-pay

## 2012-07-11 DIAGNOSIS — Z6837 Body mass index (BMI) 37.0-37.9, adult: Secondary | ICD-10-CM

## 2012-07-11 DIAGNOSIS — IMO0002 Reserved for concepts with insufficient information to code with codable children: Secondary | ICD-10-CM

## 2012-07-11 DIAGNOSIS — G459 Transient cerebral ischemic attack, unspecified: Secondary | ICD-10-CM

## 2012-07-11 DIAGNOSIS — I480 Paroxysmal atrial fibrillation: Secondary | ICD-10-CM | POA: Diagnosis present

## 2012-07-11 DIAGNOSIS — I10 Essential (primary) hypertension: Secondary | ICD-10-CM

## 2012-07-11 DIAGNOSIS — R109 Unspecified abdominal pain: Secondary | ICD-10-CM

## 2012-07-11 DIAGNOSIS — H547 Unspecified visual loss: Secondary | ICD-10-CM

## 2012-07-11 DIAGNOSIS — R1311 Dysphagia, oral phase: Secondary | ICD-10-CM | POA: Diagnosis present

## 2012-07-11 DIAGNOSIS — E669 Obesity, unspecified: Secondary | ICD-10-CM

## 2012-07-11 DIAGNOSIS — M546 Pain in thoracic spine: Secondary | ICD-10-CM

## 2012-07-11 DIAGNOSIS — E119 Type 2 diabetes mellitus without complications: Secondary | ICD-10-CM | POA: Diagnosis present

## 2012-07-11 DIAGNOSIS — M1710 Unilateral primary osteoarthritis, unspecified knee: Secondary | ICD-10-CM

## 2012-07-11 DIAGNOSIS — R7302 Impaired glucose tolerance (oral): Secondary | ICD-10-CM | POA: Diagnosis present

## 2012-07-11 DIAGNOSIS — R002 Palpitations: Secondary | ICD-10-CM

## 2012-07-11 DIAGNOSIS — R4789 Other speech disturbances: Secondary | ICD-10-CM | POA: Diagnosis present

## 2012-07-11 DIAGNOSIS — M179 Osteoarthritis of knee, unspecified: Secondary | ICD-10-CM

## 2012-07-11 DIAGNOSIS — I4510 Unspecified right bundle-branch block: Secondary | ICD-10-CM | POA: Diagnosis present

## 2012-07-11 DIAGNOSIS — M25561 Pain in right knee: Secondary | ICD-10-CM

## 2012-07-11 DIAGNOSIS — Z7982 Long term (current) use of aspirin: Secondary | ICD-10-CM

## 2012-07-11 DIAGNOSIS — I4891 Unspecified atrial fibrillation: Principal | ICD-10-CM | POA: Diagnosis present

## 2012-07-11 DIAGNOSIS — M25562 Pain in left knee: Secondary | ICD-10-CM

## 2012-07-11 DIAGNOSIS — E785 Hyperlipidemia, unspecified: Secondary | ICD-10-CM

## 2012-07-11 DIAGNOSIS — E7800 Pure hypercholesterolemia, unspecified: Secondary | ICD-10-CM | POA: Diagnosis present

## 2012-07-11 DIAGNOSIS — Z79899 Other long term (current) drug therapy: Secondary | ICD-10-CM

## 2012-07-11 LAB — BASIC METABOLIC PANEL
BUN: 14 mg/dL (ref 6–23)
Creatinine, Ser: 0.72 mg/dL (ref 0.50–1.10)
GFR calc Af Amer: >90 mL/min (ref >90)
GFR calc non Af Amer: 89 mL/min — ABNORMAL LOW (ref >90)

## 2012-07-11 LAB — CBC WITH DIFFERENTIAL/PLATELET
Basophils Relative: 1 % (ref 0–1)
HCT: 36.3 % (ref 36.0–46.0)
Lymphs Abs: 2.4 10*3/uL (ref 0.7–4.0)
MCH: 28.4 pg (ref 26.0–34.0)
MCV: 85.2 fL (ref 78.0–100.0)
Monocytes Relative: 7 % (ref 3–12)
Platelets: 249 10*3/uL (ref 150–400)
RDW: 15.2 % (ref 11.5–15.5)

## 2012-07-11 MED ORDER — INSULIN ASPART 100 UNIT/ML ~~LOC~~ SOLN
0.0000 [IU] | Freq: Three times a day (TID) | SUBCUTANEOUS | Status: DC
  Administered 2012-07-12 – 2012-07-13 (×2): 3 [IU] via SUBCUTANEOUS

## 2012-07-11 MED ORDER — METFORMIN HCL 500 MG PO TABS
500.0000 mg | ORAL_TABLET | Freq: Every day | ORAL | Status: DC
  Administered 2012-07-12 – 2012-07-14 (×3): 500 mg via ORAL
  Filled 2012-07-11 (×3): qty 1

## 2012-07-11 MED ORDER — TRAMADOL-ACETAMINOPHEN 37.5-325 MG PO TABS
1.0000 | ORAL_TABLET | ORAL | Status: DC | PRN
  Administered 2012-07-11: 1 via ORAL
  Filled 2012-07-11: qty 1

## 2012-07-11 MED ORDER — ATORVASTATIN CALCIUM 40 MG PO TABS
40.0000 mg | ORAL_TABLET | Freq: Every day | ORAL | Status: DC
  Administered 2012-07-11 – 2012-07-13 (×3): 40 mg via ORAL
  Filled 2012-07-11 (×3): qty 1

## 2012-07-11 MED ORDER — RIVAROXABAN 10 MG PO TABS
20.0000 mg | ORAL_TABLET | Freq: Every day | ORAL | Status: DC
  Administered 2012-07-12 – 2012-07-13 (×2): 20 mg via ORAL
  Filled 2012-07-11 (×2): qty 2

## 2012-07-11 MED ORDER — ATORVASTATIN CALCIUM 40 MG PO TABS: 40.0000 mg | ORAL_TABLET | Freq: Every day | ORAL | Status: DC

## 2012-07-11 MED ORDER — ONDANSETRON HCL 4 MG PO TABS: 4.0000 mg | ORAL_TABLET | Freq: Four times a day (QID) | ORAL | Status: DC | PRN

## 2012-07-11 MED ORDER — SODIUM CHLORIDE 0.9 % IV SOLN
1000.0000 mL | INTRAVENOUS | Status: DC
  Administered 2012-07-11: 1000 mL via INTRAVENOUS

## 2012-07-11 MED ORDER — DILTIAZEM HCL 100 MG IV SOLR
5.0000 mg/h | INTRAVENOUS | Status: DC
  Administered 2012-07-11 (×2): 5 mg/h via INTRAVENOUS

## 2012-07-11 MED ORDER — METOPROLOL SUCCINATE ER 50 MG PO TB24
50.0000 mg | ORAL_TABLET | Freq: Two times a day (BID) | ORAL | Status: DC
  Administered 2012-07-11 – 2012-07-14 (×6): 50 mg via ORAL
  Filled 2012-07-11 (×6): qty 1

## 2012-07-11 MED ORDER — INSULIN ASPART 100 UNIT/ML ~~LOC~~ SOLN: 0.0000 [IU] | Freq: Every day | SUBCUTANEOUS | Status: DC

## 2012-07-11 MED ORDER — ASPIRIN EC 81 MG PO TBEC
81.0000 mg | DELAYED_RELEASE_TABLET | Freq: Every day | ORAL | Status: DC
  Administered 2012-07-12 – 2012-07-14 (×3): 81 mg via ORAL
  Filled 2012-07-11 (×3): qty 1

## 2012-07-11 MED ORDER — SODIUM CHLORIDE 0.9 % IJ SOLN
3.0000 mL | Freq: Two times a day (BID) | INTRAMUSCULAR | Status: DC
  Administered 2012-07-11 – 2012-07-14 (×6): 3 mL via INTRAVENOUS

## 2012-07-11 MED ORDER — ONDANSETRON HCL 4 MG/2ML IJ SOLN: 4.0000 mg | Freq: Four times a day (QID) | INTRAMUSCULAR | Status: DC | PRN

## 2012-07-11 MED ORDER — CALCIUM CARBONATE-VITAMIN D 500-200 MG-UNIT PO TABS
1.0000 | ORAL_TABLET | Freq: Two times a day (BID) | ORAL | Status: DC
  Administered 2012-07-12 – 2012-07-14 (×5): 1 via ORAL
  Filled 2012-07-11 (×11): qty 1

## 2012-07-11 MED ORDER — PREDNISONE 10 MG PO TABS
5.0000 mg | ORAL_TABLET | Freq: Every day | ORAL | Status: DC
  Administered 2012-07-12 – 2012-07-14 (×3): 5 mg via ORAL
  Filled 2012-07-11 (×3): qty 1

## 2012-07-11 NOTE — H&P (Signed)
[facility] History and Physical  Samantha Vega WUJ:[id number] DOB: 1950-12-29 DOA: 07/11/2012  Referring physician: Dr. Iantha Fallen, ER physician. PCP: Syliva Overman, MD  Specialists: Dr. Dietrich Pates, cardiology.  Chief Complaint: Palpitations, dysphasia  HPI: Samantha Vega is a 62 y.o. female presented to the hospital today having had sudden onset of palpitations at 9 AM. This was followed approximately 15-20 minutes later by an episode of difficulty with speech. She was unable to say what she wanted to say. This episode lasted approximately another 15-20 minutes. Now her symptoms have resolved. Her palpitations are better since she is on a Cardizem drip for her atrial fibrillation with rapid ventricular response. She denies any significant chest pain, dyspnea or loss of consciousness. She was started on xeralto when she was in Dr. Marvel Plan office in December and has been compliant. She is diabetic, hypertensive.   Review of Systems: Apart from history of present illness, other systems negative.  Past Medical History  Diagnosis Date  . Right bundle branch block     +Palpitations.   EKG 11/2006:  NSR, RBBB, LAFB, markedly delayed R-wave progression, no change; Echocardiogram in 10/2006-suboptimal quality, normal; Event recorder-PVCs, no significant arrhythmias or symptoms ;  . Obesity   . Hyperlipidemia   . Hypertension   . Diabetes mellitus   . Chest pain     Noncardiac; evaluated and treated in 2008  . Enlarged heart    Past Surgical History  Procedure Date  . Total abdominal hysterectomy w/ bilateral salpingoophorectomy   . Shoulder surgery 2006    Left-post-trauma   Social History:  She is married, lives with her husband. She does not smoke and does not drink alcohol. She is otherwise fully independent.   No Known Allergies  Family History  Problem Relation Age of Onset  . Hypertension Mother     and sister  . Arthritis Mother   . Stroke Father   .  Hypertension Father   . Diabetes Sister     x1  . Sarcoidosis Sister     x1  . Heart failure Brother   . Coronary artery disease Father      Prior to Admission medications   Medication Sig Start Date End Date Taking? Authorizing Provider  aspirin EC 81 MG tablet Take 81 mg by mouth daily.    Historical Provider, MD  calcium-vitamin D (OSCAL 500/200 D-3) 500 MG tablet Take 1 tablet by mouth 2 (two) times daily.     Historical Provider, MD  CRESTOR 20 MG tablet TAKE 1 TABLET (20 MG TOTAL) BY MOUTH AT BEDTIME. 06/30/12   Kerri Perches, MD  ibuprofen (ADVIL,MOTRIN) 600 MG tablet Take 600 mg by mouth 3 (three) times daily as needed. DON NOT TAKE WITH NABUMETONE/RELAFEN    Historical Provider, MD  metFORMIN (GLUCOPHAGE) 500 MG tablet TAKE 1 TABLET BY MOUTH 2 TIMES DAILY WITH A MEAL. 03/20/12   Kerri Perches, MD  metoprolol succinate (TOPROL-XL) 50 MG 24 hr tablet Take 1 tablet (50 mg total) by mouth 2 (two) times daily. Dose increase 05/23/12   Christiane Ha, MD  Multiple Vitamins-Minerals ([employer] SPECTRAVITE PO) Take 1 tablet by mouth daily.    Historical Provider, MD  nabumetone (RELAFEN) 500 MG tablet Take 500 mg by mouth 2 (two) times daily. DO NOT TAKE WHEN USING IBUPROFEN 05/05/12   Vickki Hearing, MD  predniSONE (DELTASONE) 5 MG tablet Take 5 mg by mouth daily.  05/03/12   Historical Provider, MD  Rivaroxaban Carlena Hurl)  20 MG TABS Take 1 tablet (20 mg total) by mouth daily. 06/02/12   Dyann Kief, PA  rosuvastatin (CRESTOR) 20 MG tablet Take 1 tablet (20 mg total) by mouth at bedtime. 08/13/11   Kerri Perches, MD  traMADol-acetaminophen [facility]) 37.5-325 MG per tablet  05/21/12   Historical Provider, MD   Physical Exam: Filed Vitals:   07/11/12 1015 07/11/12 1045  BP: 130/96 128/84  Pulse: 94 103  Temp: 98.1 F (36.7 C)   TempSrc: Oral   Resp: 22   Height: 5\' 1"  (1.549 m)   Weight: 90.719 kg (200 lb)   SpO2: 98% 94%     General:  She looks systemically  well.  Eyes: No pallor. No jaundice.  ENT: No abnormalities.  Neck: No lymphadenopathy.  Cardiovascular: Heart sounds are irregularly irregular, consistent with atrial fibrillation. There are no carotid bruits  Respiratory: Lung fields are clear.  Abdomen: Soft, nontender. No masses.  Skin: No rash.  Musculoskeletal: No major joint problems.  Psychiatric: Appropriate affect.  Neurologic: Alert and orientated. Normal speech. No facial asymmetry. No limb weakness. No focal neurological signs.  Labs on Admission:  Basic Metabolic Panel:  Lab 07/11/12 0981  NA 139  K 3.3*  CL 102  CO2 26  GLUCOSE 118*  BUN 14  CREATININE 0.72  CALCIUM 10.1  MG --  PHOS --       CBC:  Lab 07/11/12 1023  WBC 5.6  NEUTROABS 2.6  HGB 12.1  HCT 36.3  MCV 85.2  PLT 249   Cardiac Enzymes:  Lab 07/11/12 1023  CKTOTAL --  CKMB --  CKMBINDEX --  TROPONINI <0.30     Radiological Exams on Admission: Ct Head Wo Contrast  07/11/2012  *RADIOLOGY REPORT*  Clinical Data: Altered mental status  CT HEAD WITHOUT CONTRAST  Technique:  Contiguous axial images were obtained from the base of the skull through the vertex without contrast.  Comparison: 05/22/2012  Findings: No skull fracture is noted.  Paranasal sinuses and mastoid air cells are unremarkable.  No intracranial hemorrhage, mass effect or midline shift.  Again noted cavum septum pellucidum.  No acute infarction.  No mass lesion is noted on this unenhanced scan.  IMPRESSION: No acute intracranial abnormality.  No significant change.   Original Report Authenticated By: Natasha Mead, M.D.    Dg Chest Portable 1 View  07/11/2012  *RADIOLOGY REPORT*  Clinical Data: Chest pain  PORTABLE CHEST - 1 VIEW  Comparison: 05/22/12  Findings: Cardiomegaly again noted.  No convincing pulmonary edema. Elevation of the right hemidiaphragm.  Mild left basilar atelectasis or infiltrate.  IMPRESSION: No convincing pulmonary edema.  Elevation of the right  hemidiaphragm.  Mild left basilar atelectasis or infiltrate.   Original Report Authenticated By: Natasha Mead, M.D.     EKG: Independently reviewed. Atrial fibrillation, now ventricular rate is controlled. There is no obvious ST segment depression or elevation.  Assessment/Plan   1. Atrial fibrillation with rapid ventricular response, better controlled on Cardizem drip. 2. Possible TIA. 3. Hypertension. 4. Type 2 diabetes mellitus. Plan: 1. Admit to step down unit since she is on Cardizem drip. 2. Continue with all medications. 3. Bilateral carotid Dopplers. 4. MRI brain to make sure she has not had a small CVA.  Code Status: Full code.   Family Communication: Discussed plan with patient and husband at the bedside   Disposition Plan: Home in medically stable.   Time spent: 45 minutes her  Wilson Singer [facility] Pager [phone].  If 7PM-7AM, please contact night-coverage [email or web address] Password [email or web address] 07/11/2012, 1:00 PM

## 2012-07-11 NOTE — ED Provider Notes (Signed)
History  This chart was scribed for Celene Kras, MD by Marlin Canary ED Scribe. The patient was seen in room APA18/APA18. Patient's care was started at 1009.  CSN: [id number]  Arrival date & time 07/11/12  4098   First MD Initiated Contact with Patient 07/11/12 1009      Chief Complaint  Patient presents with  . Chest Pain  . Aphasia   HPI COMMENTS: The history is provided by the spouse. No language interpreter was used.    Samantha Vega is a 62 y.o. female with history of hypertension, diabetes and hyperlipidemia who presents to the Emergency Department complaining of constant, non-radiating chest pain onset 1-2 hours ago. Patient describes pain as moderate in severity and "heavy" in quality. Patient's other symptoms include asphasia and palpitations. Husband states that aphasia is not baseline, and that patient is usually able to talk and communicate. She denies SOB or nausea. Patient has taken tylenol today. Husband states that patient has had similar chest pain episodes in the past. Patient has a documented history of atrial fibrillation and has been seen by Dr. Dietrich Pates in the past. She is currently on Xarelto. Previous notes indicate that patient has taken pradaxa in the past.   Past Medical History  Diagnosis Date  . Right bundle branch block     +Palpitations.   EKG 11/2006:  NSR, RBBB, LAFB, markedly delayed R-wave progression, no change; Echocardiogram in 10/2006-suboptimal quality, normal; Event recorder-PVCs, no significant arrhythmias or symptoms ;  . Obesity   . Hyperlipidemia   . Hypertension   . Diabetes mellitus   . Chest pain     Noncardiac; evaluated and treated in 2008  . Enlarged heart     Past Surgical History  Procedure Date  . Total abdominal hysterectomy w/ bilateral salpingoophorectomy   . Shoulder surgery 2006    Left-post-trauma    Family History  Problem Relation Age of Onset  . Hypertension Mother     and sister  . Arthritis Mother   .  Stroke Father   . Hypertension Father   . Diabetes Sister     x1  . Sarcoidosis Sister     x1  . Heart failure Brother   . Coronary artery disease Father     History  Substance Use Topics  . Smoking status: Never Smoker   . Smokeless tobacco: Never Used  . Alcohol Use: No    No OB history provided.  Review of Systems  A complete 10 system review of systems was obtained and all systems are negative except as noted in the HPI and PMH.   Allergies  Review of patient's allergies indicates no known allergies.  Home Medications   Current Outpatient Rx  Name  Route  Sig  Dispense  Refill  . ASPIRIN EC 81 MG PO TBEC   Oral   Take 81 mg by mouth daily.         Marland Kitchen CALCIUM-VITAMIN D 500 MG PO TABS   Oral   Take 1 tablet by mouth 2 (two) times daily.          . CRESTOR 20 MG PO TABS      TAKE 1 TABLET (20 MG TOTAL) BY MOUTH AT BEDTIME.   90 tablet   0   . IBUPROFEN 600 MG PO TABS   Oral   Take 600 mg by mouth 3 (three) times daily as needed. DON NOT TAKE WITH NABUMETONE/RELAFEN         .  METFORMIN HCL 500 MG PO TABS      TAKE 1 TABLET BY MOUTH 2 TIMES DAILY WITH A MEAL.   180 tablet   1   . METOPROLOL SUCCINATE ER 50 MG PO TB24   Oral   Take 1 tablet (50 mg total) by mouth 2 (two) times daily. Dose increase   60 tablet   0   . [employer] SPECTRAVITE PO   Oral   Take 1 tablet by mouth daily.         Marland Kitchen NABUMETONE 500 MG PO TABS   Oral   Take 500 mg by mouth 2 (two) times daily. DO NOT TAKE WHEN USING IBUPROFEN         . PREDNISONE 5 MG PO TABS   Oral   Take 5 mg by mouth daily.          Marland Kitchen RIVAROXABAN 20 MG PO TABS   Oral   Take 1 tablet (20 mg total) by mouth daily.   30 tablet   3   . ROSUVASTATIN CALCIUM 20 MG PO TABS   Oral   Take 1 tablet (20 mg total) by mouth at bedtime.   90 tablet   1   . TRAMADOL-ACETAMINOPHEN 37.5-325 MG PO TABS                 BP 130/96  Pulse 94  Temp 98.1 F (36.7 C) (Oral)  Resp 22  Ht 5\' 1"  (1.549 m)   Wt 200 lb (90.719 kg)  BMI 37.79 kg/m2  SpO2 98%  Physical Exam  Nursing note and vitals reviewed. Constitutional: She is oriented to person, place, and time. She appears well-developed and well-nourished. No distress.  HENT:  Head: Normocephalic and atraumatic.  Right Ear: External ear normal.  Left Ear: External ear normal.  Mouth/Throat: Oropharynx is clear and moist.  Eyes: Conjunctivae normal are normal. Right eye exhibits no discharge. Left eye exhibits no discharge. No scleral icterus.  Neck: Neck supple. No tracheal deviation present.  Cardiovascular: Intact distal pulses.  An irregularly irregular rhythm present. Tachycardia present.   Pulmonary/Chest: Effort normal and breath sounds normal. No stridor. No respiratory distress. She has no wheezes. She has no rales.  Abdominal: Soft. Bowel sounds are normal. She exhibits no distension. There is no tenderness. There is no rebound and no guarding.  Musculoskeletal: She exhibits no edema and no tenderness.  Neurological: She is alert and oriented to person, place, and time. She has normal strength. A cranial nerve deficit is present. No sensory deficit. She exhibits normal muscle tone. She displays no seizure activity. Coordination normal.       Aphasia. 5/5 strength in upper and lower extremities. Negative pronator drift bilaterally. Able to lift both legs off bed for 4-5 seconds.  Skin: Skin is warm and dry. No rash noted.  Psychiatric: She has a normal mood and affect.    ED Course  Procedures (including critical care time) DIAGNOSTIC STUDIES: Oxygen Saturation is 98% on room air, Normal by my interpretation.    COORDINATION OF CARE: 1009-Patient informed of current plan for treatment and evaluation and agrees with plan at this time. Will get head CT and chest x-ray and give IV fluids and cardizem.  EKG Rate 121 Atrial fibrillation with rapid ventricular response with premature ventricular or aberrantly conducted  complexes Right bundle branch block, left anterior fascicular block No prior EKG available for comparison  CRITICAL CARE Performed by: Linwood Dibbles R Total critical care time: 35 Critical care  time was exclusive of separately billable procedures and treating other patients. Critical care was necessary to treat or prevent imminent or life-threatening deterioration. Critical care was time spent personally by me on the following activities: development of treatment plan with patient and/or surrogate as well as nursing, discussions with consultants, evaluation of patient's response to treatment, examination of patient, obtaining history from patient or surrogate, ordering and performing treatments and interventions, ordering and review of laboratory studies, ordering and review of radiographic studies, pulse oximetry and re-evaluation of patient's condition.    Labs Reviewed  BASIC METABOLIC PANEL - Abnormal; Notable for the following:    Potassium 3.3 (*)     Glucose, Bld 118 (*)     GFR calc non Af Amer 89 (*)     All other components within normal limits  PROTIME-INR - Abnormal; Notable for the following:    Prothrombin Time 25.1 (*)     INR 2.41 (*)     All other components within normal limits  APTT - Abnormal; Notable for the following:    aPTT 48 (*)     All other components within normal limits  CBC WITH DIFFERENTIAL  TROPONIN I  TROPONIN I  TROPONIN I  TROPONIN I  MRSA PCR SCREENING   Ct Head Wo Contrast  07/11/2012  *RADIOLOGY REPORT*  Clinical Data: Altered mental status  CT HEAD WITHOUT CONTRAST  Technique:  Contiguous axial images were obtained from the base of the skull through the vertex without contrast.  Comparison: 05/22/2012  Findings: No skull fracture is noted.  Paranasal sinuses and mastoid air cells are unremarkable.  No intracranial hemorrhage, mass effect or midline shift.  Again noted cavum septum pellucidum.  No acute infarction.  No mass lesion is noted on this  unenhanced scan.  IMPRESSION: No acute intracranial abnormality.  No significant change.   Original Report Authenticated By: Natasha Mead, M.D.    Dg Chest Portable 1 View  07/11/2012  *RADIOLOGY REPORT*  Clinical Data: Chest pain  PORTABLE CHEST - 1 VIEW  Comparison: 05/22/12  Findings: Cardiomegaly again noted.  No convincing pulmonary edema. Elevation of the right hemidiaphragm.  Mild left basilar atelectasis or infiltrate.  IMPRESSION: No convincing pulmonary edema.  Elevation of the right hemidiaphragm.  Mild left basilar atelectasis or infiltrate.   Original Report Authenticated By: Natasha Mead, M.D.      1. Atrial fibrillation with RVR   2. DM (diabetes mellitus), type 2   3. TIA (transient ischemic attack)   4. Unspecified essential hypertension       MDM  Atrial fibrillation with rapid ventricular response. According to the patient's old records she does have a history of A. Fib with RVR. Old notes indicate she had been prescribed pradaxa.  The patient and her family member are not familiar with this diagnosis.  Pt responded to IV cardizem, symptoms improved significantly and heart rate is improved.  Aphasia The patient's symptoms started this morning.  11:17 AM I reassessed the patient and her speech difficulties have all resolved. The patient has no aphasia. She is alert and oriented and much more comfortable. She is also no longer tearful anxious.Unclear if she actually was having TIA stroke symptoms versus anxiety and stress associated with her a fib with RVR.      I personally performed the services described in this documentation, which was scribed in my presence.  The recorded information has been reviewed and is accurate.   Celene Kras, MD 07/11/12 [id number]

## 2012-07-11 NOTE — ED Notes (Signed)
Report given to Nadine, RN.

## 2012-07-11 NOTE — ED Notes (Signed)
Pt rhythm a-fib with rate of 60's repeat ekg done, given to Dr. Lynelle Doctor, pt states that she is feeling better,

## 2012-07-11 NOTE — ED Notes (Signed)
Pt presents to er with spouse with c/o heart feeling heavy and was beating fast, pt also c/o of having trouble speaking, pt slow to answer questions upon arrival to er, husband providing answers to questions instead, when pt would speak she would speak in complete sentences, speech clear, states "I don't want to talk", grips equal, sensation equal bilateral, ekg done on arrival to er, Dr. Lynelle Doctor at bedside,

## 2012-07-11 NOTE — ED Notes (Signed)
Husband reports pt woke up around 0730 this morning and was c/o chest feeling heavy and heart racing.  Reports approx 0930 she began shaking and having difficulty speaking.  Pt c/o generalized weakness.  Pt alert, follows commands, shaking all over, has broken speech.

## 2012-07-11 NOTE — ED Notes (Addendum)
Pt states that she is feeling better, heart rhythm a-fib with rate of 77, pt denies any pain states that she is feeling better, asking for something to drink states that she has a "choking" feeling in her throat, advised pt that she is not allowed anything to eat or drink until all results are back, pt and family expressed understanding,  Pt is able to speak now states that she did not want to talk earlier due to her heart "hurting"

## 2012-07-11 NOTE — ED Notes (Addendum)
Dr. Karilyn Cota notified of stroke swallow screen results,  advised to continue with diet he had already ordered for pt,

## 2012-07-11 NOTE — ED Notes (Signed)
Dr. Randol Kern in room with pt,  Family at bedside,

## 2012-07-12 ENCOUNTER — Inpatient Hospital Stay (HOSPITAL_COMMUNITY): Payer: PRIVATE HEALTH INSURANCE

## 2012-07-12 DIAGNOSIS — I4891 Unspecified atrial fibrillation: Secondary | ICD-10-CM

## 2012-07-12 LAB — GLUCOSE, CAPILLARY
Glucose-Capillary: 86 mg/dL (ref 70–99)
Glucose-Capillary: 107 mg/dL — ABNORMAL HIGH (ref 70–99)
Glucose-Capillary: 90 mg/dL (ref 70–99)

## 2012-07-12 LAB — COMPREHENSIVE METABOLIC PANEL
ALT: 14 U/L (ref 0–35)
AST: 14 U/L (ref 0–37)
Albumin: 3.2 g/dL — ABNORMAL LOW (ref 3.5–5.2)
Alkaline Phosphatase: 75 U/L (ref 39–117)
GFR calc Af Amer: >90 mL/min (ref >90)
Glucose, Bld: 88 mg/dL (ref 70–99)
Potassium: 3.1 mEq/L — ABNORMAL LOW (ref 3.5–5.1)
Sodium: 139 mEq/L (ref 135–145)
Total Protein: 6.8 g/dL (ref 6.0–8.3)

## 2012-07-12 LAB — TROPONIN I: Troponin I: <0.3 ng/mL (ref <0.30)

## 2012-07-12 LAB — CBC
HCT: 34.8 % — ABNORMAL LOW (ref 36.0–46.0)
MCH: 28.4 pg (ref 26.0–34.0)
MCHC: 33 g/dL (ref 30.0–36.0)
MCV: 85.9 fL (ref 78.0–100.0)
RDW: 15.3 % (ref 11.5–15.5)

## 2012-07-12 MED ORDER — DILTIAZEM HCL 30 MG PO TABS
30.0000 mg | ORAL_TABLET | Freq: Four times a day (QID) | ORAL | Status: DC
  Administered 2012-07-12 – 2012-07-14 (×10): 30 mg via ORAL
  Filled 2012-07-12 (×10): qty 1

## 2012-07-12 MED ORDER — POTASSIUM CHLORIDE CRYS ER 20 MEQ PO TBCR
40.0000 meq | EXTENDED_RELEASE_TABLET | Freq: Once | ORAL | Status: AC
  Administered 2012-07-12: 40 meq via ORAL
  Filled 2012-07-12: qty 2

## 2012-07-12 NOTE — Progress Notes (Signed)
PT TRANSFERING TO ROOM 311 ON TELEMETRY. R 80'S IN ATRIAL FIB. DENIED [place] CHEST PAIN OR DISCOMFORT. VSS. RT WRIST NSL PATENT. LAST CGB 107. NO SSI COVERAGE REQUIRED. ALERT AND ORIENTED. TRANSFER REPORT CALLED TO MARY BETH HAWKINS RN. WILL TRANFER PT AFTER SHE EATS HER DINNER.Marland Kitchen

## 2012-07-12 NOTE — Progress Notes (Signed)
UR Chart Review Completed  

## 2012-07-12 NOTE — Progress Notes (Signed)
*  PRELIMINARY RESULTS* Echocardiogram 2D Echocardiogram has been performed.  Conrad Sandoval 07/12/2012, 3:18 PM

## 2012-07-12 NOTE — Consult Note (Addendum)
CARDIOLOGY CONSULT NOTE  Patient ID: Samantha Vega MRN: [id number] DOB/AGE: 24-Jun-1950 62 y.o.  Admit date: 07/11/2012 Referring Physician: PTH-Gosrani Primary PhysicianMargaret Lodema Hong, MD Primary Cardiologist:Bennett Vanscyoc, MD Reason for Consultation: Atrial fibrillation with RVR  Principal Problem:  *TIA (transient ischemic attack) Active Problems:  HYPERTENSION  DM (diabetes mellitus), type 2  Atrial fibrillation with RVR  HPI: Samantha Vega is a 62 y/o patient followed by Dr. Dietrich Pates admitted for Atrial fib with RVR and TIA symptoms. She has a history of atrial fib initially diagnosed on ER visit in 05/22/12 and has been on Xarelto 20 mg since that time. During that hospitalization did convert to NSR. Yesterday am,he began to feel heaviness in her chest, palpitations and dysphasia  prompting evaluation in the ER. She denies weakness, , numbness or tingling in her extremities. In ER she was found to have HR of 121 bpm in atrial fib with PVC's. She had a BP 130/96. She was placed on a cardizem drip at 5 mg/ hr. CT scan did not reveal acute intracranial abnormality or infarction.Cardiac enzymes have been negative X 5. She was hypokalemic 3.3 which is being repleated. She is currently without symptoms with controlled HR with transition to Cardizem 30 mg QID.    Other history includes diabetes, hypertension, hyperlipidemia, and obesity. She had a cardiac catheterization in 2004 demonstrating Trivial coronary artery disease by angiogram with normal left ventricular systolic function.  Review of systems complete and found to be negative unless listed above   Past Medical History  Diagnosis Date  . Right bundle branch block     +Palpitations.   EKG 11/2006:  NSR, RBBB, LAFB, markedly delayed R-wave progression, no change; Echocardiogram in 10/2006-suboptimal quality, normal; Event recorder-PVCs, no significant arrhythmias or symptoms ;  . Obesity   . Hyperlipidemia   . Hypertension   .  Diabetes mellitus   . Chest pain     Noncardiac; evaluated and treated in 2008  . Enlarged heart     Family History  Problem Relation Age of Onset  . Hypertension Mother     and sister  . Arthritis Mother   . Stroke Father   . Hypertension Father   . Diabetes Sister     x1  . Sarcoidosis Sister     x1  . Heart failure Brother   . Coronary artery disease Father     History   Social History  . Marital Status: Married    Spouse Name: N/A    Number of Children: 0  . Years of Education: N/A   Occupational History  . Restaurant worker [employer]   Social History Main Topics  . Smoking status: Never Smoker   . Smokeless tobacco: Never Used  . Alcohol Use: No  . Drug Use: No  . Sexually Active: Not on file   Other Topics Concern  . Not on file   Social History Narrative  . No narrative on file    Past Surgical History  Procedure Date  . Total abdominal hysterectomy w/ bilateral salpingoophorectomy   . Shoulder surgery 2006    Left-post-trauma     Prescriptions prior to admission  Medication Sig Dispense Refill  . aspirin EC 81 MG tablet Take 81 mg by mouth daily.      . calcium-vitamin D (OSCAL 500/200 D-3) 500 MG tablet Take 1 tablet by mouth 2 (two) times daily.       . CRESTOR 20 MG tablet TAKE 1 TABLET (20 MG TOTAL) BY MOUTH AT BEDTIME.  90 tablet  0  . ibuprofen (ADVIL,MOTRIN) 600 MG tablet Take 600 mg by mouth 3 (three) times daily as needed. DON NOT TAKE WITH NABUMETONE/RELAFEN      . metFORMIN (GLUCOPHAGE) 500 MG tablet TAKE 1 TABLET BY MOUTH 2 TIMES DAILY WITH A MEAL.  180 tablet  1  . metoprolol succinate (TOPROL-XL) 50 MG 24 hr tablet Take 1 tablet (50 mg total) by mouth 2 (two) times daily. Dose increase  60 tablet  0  . Multiple Vitamins-Minerals ([employer] SPECTRAVITE PO) Take 1 tablet by mouth daily.      . nabumetone (RELAFEN) 500 MG tablet Take 500 mg by mouth 2 (two) times daily. DO NOT TAKE WHEN USING IBUPROFEN      . predniSONE (DELTASONE) 5 MG tablet  Take 5 mg by mouth daily.       . Rivaroxaban (XARELTO) 20 MG TABS Take 1 tablet (20 mg total) by mouth daily.  30 tablet  3  . rosuvastatin (CRESTOR) 20 MG tablet Take 1 tablet (20 mg total) by mouth at bedtime.  90 tablet  1  . traMADol-acetaminophen (ULTRACET) 37.5-325 MG per tablet        Physical Exam: Blood pressure 121/70, pulse 72, temperature 97.6 F (36.4 C), temperature source Oral, resp. rate 15, height 5\' 2"  (1.575 m), weight 205 lb 11 oz (93.3 kg), SpO2 92.00%. General: Well developed, well nourished, in no acute distress Head: Eyes PERRLA, No xanthomas.   Normal cephalic and atramatic  Lungs: Clear bilaterally to auscultation and percussion. Heart: HRIR S1 S2, soft systolic murmur.   Pulses are 2+ & equal.            No carotid bruit. No JVD.  No abdominal bruits. No femoral bruits. Abdomen: Bowel sounds are positive, abdomen soft and non-tender without masses or                  Hernia's noted. Msk:  Back normal, normal gait. Normal strength and tone for age. Extremities: No clubbing, cyanosis or edema.  DP +1 Neuro: Alert and oriented X 3.Speech is slow, but no slurring or word salad is noted. No focal neurologic abnormalities. Psych:  Good affect, responds appropriately   Lab Results  Component Value Date   WBC 6.4 07/12/2012   HGB 11.5* 07/12/2012   HCT 34.8* 07/12/2012   MCV 85.9 07/12/2012   PLT 220 07/12/2012    Lab 07/12/12 0450  NA 139  K 3.1*  CL 102  CO2 28  BUN 14  CREATININE 0.67  CALCIUM 9.4  PROT 6.8  BILITOT 0.2*  ALKPHOS 75  ALT 14  AST 14  GLUCOSE 88   Lab Results  Component Value Date   TROPONINI <0.30 07/12/2012    Lab Results  Component Value Date   CHOL 154 04/16/2012   CHOL 187 08/14/2011   CHOL 181 10/14/2010   Lab Results  Component Value Date   HDL 35* 04/16/2012   HDL 40 08/14/2011   HDL 41 1/61/0960   Lab Results  Component Value Date   LDLCALC 100* 04/16/2012   LDLCALC 120* 08/14/2011   LDLCALC 119* 10/14/2010   Lab  Results  Component Value Date   TRIG 94 04/16/2012   TRIG 135 08/14/2011   TRIG 106 10/14/2010   Lab Results  Component Value Date   CHOLHDL 4.4 04/16/2012   CHOLHDL 4.7 08/14/2011   CHOLHDL 4.4 10/14/2010   No results found for this basename: LDLDIRECT    Radiology: Ct  Head Wo Contrast  07/11/2012  *RADIOLOGY REPORT*  Clinical Data: Altered mental status  CT HEAD WITHOUT CONTRAST  Technique:  Contiguous axial images were obtained from the base of the skull through the vertex without contrast.  Comparison: 05/22/2012  Findings: No skull fracture is noted.  Paranasal sinuses and mastoid air cells are unremarkable.  No intracranial hemorrhage, mass effect or midline shift.  Again noted cavum septum pellucidum.  No acute infarction.  No mass lesion is noted on this unenhanced scan.  IMPRESSION: No acute intracranial abnormality.  No significant change.   Original Report Authenticated By: Natasha Mead, M.D.    Mr Brain Wo Contrast  07/12/2012  *RADIOLOGY REPORT*  Clinical Data: Chest palpitations.  Episodic speech difficulty which has now resolved.  History of hypertension.  History of diabetes.  MRI HEAD WITHOUT CONTRAST  Technique:  Multiplanar, multiecho pulse sequences of the brain and surrounding structures were obtained according to standard protocol without intravenous contrast.  Comparison: 07/11/2012.  Findings: There is no evidence for acute infarction, intracranial hemorrhage, mass lesion, hydrocephalus, or extra-axial fluid. Slight atrophy.  Mild chronic microvascular ischemic change noted in the periventricular regions.  Partial empty sella.  Flow voids are maintained in the major intracranial vessels.  No midline shift.  No foci of chronic hemorrhage.  Normal cerebellar tonsils and upper cervical region.  No worrisome osseous lesions.  Negative orbits and mastoids.  Mild chronic sinusitis.  Heavily calcified or ossified 1 cm falcine lesion in the parasagittal posterior frontal region, correlates  with  CT, likely incidental falx ossification or small meningioma.  IMPRESSION: Mild atrophy.  Slight periventricular white matter signal abnormality, likely chronic microvascular ischemic change. No acute stroke.  No acute intracranial abnormality.  Partial empty sella.   Original Report Authenticated By: Davonna Belling, M.D.    Dg Chest Portable 1 View  07/11/2012  *RADIOLOGY REPORT*  Clinical Data: Chest pain  PORTABLE CHEST - 1 VIEW  Comparison: 05/22/12  Findings: Cardiomegaly again noted.  No convincing pulmonary edema. Elevation of the right hemidiaphragm.  Mild left basilar atelectasis or infiltrate.  IMPRESSION: No convincing pulmonary edema.  Elevation of the right hemidiaphragm.  Mild left basilar atelectasis or infiltrate.   Original Report Authenticated By: Natasha Mead, M.D.    EKG: Atrial fibrillation rate of 70 bpm Left axis deviation Right bundle branch block  ASSESSMENT AND PLAN:   1. Atrial fib with RVR: Heart rate now much better controlled with cardizem now PO. She was previously on metoprolol XL 50 mg only at home. Now increased dose to 50 mg BID, with addition of CCB. Continues on Xarelto. She is hemodynamically stable on both medications and tolerating them. Echocardiogram is ordered.  2. TIA: Remains on Xarelto. No focal deficit noted on assessment.  3. Hypertension: Currently controlled. Echo pending for evaluation of LV fx. 4.Diabetes 5. Obesity 6. Hypercholesterolemia: Last labs completed in 11/13 with good TC control with sub-optimal LDL of 100. She is on rosuvastatin at home.  Bettey Mare. Lyman Bishop NP [facility] 07/12/2012, 9:55 AM  Cardiology Attending Patient interviewed and examined. Discussed with Joni Reining, NP.  Above note annotated and modified based upon my findings.  Recurrent atrial fibrillation with rapid ventricular response.  Symptoms are of some concern as possibly reflecting underlying CAD/myocardial ischemia induced by AF and tachycardia ,  but may be related to her arrhythmia alone. She is stable for discharge with adequate anticoagulation and control of heart rate. She will require stress testing, consideration of cardioversion  in a few weeks and consideration of antiarrhythmic therapy.  Thyroid function was normal one month ago. Echocardiography has been performed, and images will be reviewed.  [place] Bing, MD 07/12/2012, 7:17 PM

## 2012-07-12 NOTE — Evaluation (Signed)
Clinical/Bedside Swallow Evaluation Patient Details  Name: Samantha Vega MRN: [id number] Date of Birth: 06-Nov-1950  Today's Date: 07/12/2012 Time: 9147-8295 SLP Time Calculation (min): 20 min  Past Medical History:  Past Medical History  Diagnosis Date  . Right bundle branch block     +Palpitations.   EKG 11/2006:  NSR, RBBB, LAFB, markedly delayed R-wave progression, no change; Echocardiogram in 10/2006-suboptimal quality, normal; Event recorder-PVCs, no significant arrhythmias or symptoms ;  . Obesity   . Hyperlipidemia   . Hypertension   . Diabetes mellitus   . Chest pain     Noncardiac; evaluated and treated in 2008  . Enlarged heart    Past Surgical History:  Past Surgical History  Procedure Date  . Total abdominal hysterectomy w/ bilateral salpingoophorectomy   . Shoulder surgery 2006    Left-post-trauma   HPI:  62 yo female admitted with stroke-like symptoms yesterday. She failed RN swallow screen in ED because she gagged and coughed up crackers and water.   Assessment / Plan / Recommendation Clinical Impression  Oropharyngeal swallow appears WFL, however after the meal the pt belched and gagged and coughed up water and mucous. Discussed with Dr. Karilyn Cota, will keep in soft diet and monitor for possible MBSS tomorrow.    Aspiration Risk  Mild    Diet Recommendation Dysphagia 3 (Mechanical Soft);Thin liquid   Liquid Administration via: Straw;Cup Medication Administration: Whole meds with liquid Supervision: Patient able to self feed Compensations: Small sips/bites Postural Changes and/or Swallow Maneuvers: Seated upright 90 degrees;Upright 30-60 min after meal;Out of bed for meals    Other  Recommendations Recommended Consults: MBS;Consider GI evaluation Oral Care Recommendations: Oral care BID   Follow Up Recommendations       Frequency and Duration            Swallow Study Prior Functional Status   Independent    General Date of Onset:  07/11/12 HPI: 62 yo female admitted with stroke-like symptoms yesterday. She failed RN swallow screen in ED because she gagged and coughed up crackers and water. Type of Study: Bedside swallow evaluation Diet Prior to this Study: Regular;Thin liquids Temperature Spikes Noted: No Respiratory Status: Room air Behavior/Cognition: Alert;Cooperative;Pleasant mood Oral Cavity - Dentition: Missing dentition;Poor condition Self-Feeding Abilities: Able to feed self Patient Positioning: Upright in bed Baseline Vocal Quality: Clear Volitional Cough: Strong Volitional Swallow: Able to elicit    Oral/Motor/Sensory Function Overall Oral Motor/Sensory Function: Appears within functional limits for tasks assessed   Ice Chips Ice chips: Within functional limits   Thin Liquid Thin Liquid: Within functional limits    Nectar Thick Nectar Thick Liquid: Not tested   Honey Thick Honey Thick Liquid: Not tested   Puree Puree: Within functional limits Presentation: Spoon   Solid       Solid: Within functional limits Other Comments: a couple of minutes after eating, pt belched and started gagging/coughing and expectorated mucous and liquid.       Daryan Buell 07/12/2012,3:23 PM

## 2012-07-12 NOTE — Progress Notes (Signed)
     Subjective: This lady feels back to her usual self. She has had no more speech difficulties. She remains in atrial fibrillation overnight. She is on Cardizem drip still.           Physical Exam: Blood pressure 124/72, pulse 62, temperature 97.6 F (36.4 C), temperature source Oral, resp. rate 17, height 5\' 2"  (1.575 m), weight 93.3 kg (205 lb 11 oz), SpO2 93.00%. She looks systemically well. Her ventricular rate is controlled on Cardizem drip. She remains in atrial fibrillation with an irregularly irregular rhythm. There are no murmurs. Lung fields are clear. There are no focal neurological signs.   Investigations:  Recent Results (from the past 240 hour(s))  MRSA PCR SCREENING     Status: Normal   Collection Time   07/11/12  3:07 PM      Component Value Range Status Comment   MRSA by PCR NEGATIVE  NEGATIVE Final      Basic Metabolic Panel:  Basename 07/12/12 0450 07/11/12 1023  NA 139 139  K 3.1* 3.3*  CL 102 102  CO2 28 26  GLUCOSE 88 118*  BUN 14 14  CREATININE 0.67 0.72  CALCIUM 9.4 10.1  MG -- --  PHOS -- --   Liver Function Tests:  [facility] 07/12/12 0450  AST 14  ALT 14  ALKPHOS 75  BILITOT 0.2*  PROT 6.8  ALBUMIN 3.2*     CBC:  Basename 07/12/12 0450 07/11/12 1023  WBC 6.4 5.6  NEUTROABS -- 2.6  HGB 11.5* 12.1  HCT 34.8* 36.3  MCV 85.9 85.2  PLT 220 249    Ct Head Wo Contrast  07/11/2012  *RADIOLOGY REPORT*  Clinical Data: Altered mental status  CT HEAD WITHOUT CONTRAST  Technique:  Contiguous axial images were obtained from the base of the skull through the vertex without contrast.  Comparison: 05/22/2012  Findings: No skull fracture is noted.  Paranasal sinuses and mastoid air cells are unremarkable.  No intracranial hemorrhage, mass effect or midline shift.  Again noted cavum septum pellucidum.  No acute infarction.  No mass lesion is noted on this unenhanced scan.  IMPRESSION: No acute intracranial abnormality.  No significant  change.   Original Report Authenticated By: Natasha Mead, M.D.    Dg Chest Portable 1 View  07/11/2012  *RADIOLOGY REPORT*  Clinical Data: Chest pain  PORTABLE CHEST - 1 VIEW  Comparison: 05/22/12  Findings: Cardiomegaly again noted.  No convincing pulmonary edema. Elevation of the right hemidiaphragm.  Mild left basilar atelectasis or infiltrate.  IMPRESSION: No convincing pulmonary edema.  Elevation of the right hemidiaphragm.  Mild left basilar atelectasis or infiltrate.   Original Report Authenticated By: Natasha Mead, M.D.       Medications: I have reviewed the patient's current medications.  Impression: 1. Atrial fibrillation with rapid ventricular response, now rate controlled on Cardizem drip. 2. Possible TIA. 3. Hypertension. 4. Type 2 diabetes mellitus. 5. Obesity.     Plan: 1. Discontinue Cardizem drip and start oral Cardizem in addition to metoprolol that she is already taking. 2. MRI brain scan and carotid Dopplers this morning. 3. Cardiology consultation this morning. 4. Possible discharge home later on today.     LOS: 1 day   Wilson Singer Pager [phone]  07/12/2012, 7:31 AM

## 2012-07-13 ENCOUNTER — Inpatient Hospital Stay (HOSPITAL_COMMUNITY): Payer: PRIVATE HEALTH INSURANCE

## 2012-07-13 LAB — GLUCOSE, CAPILLARY
Glucose-Capillary: 93 mg/dL (ref 70–99)
Glucose-Capillary: 109 mg/dL — ABNORMAL HIGH (ref 70–99)

## 2012-07-13 LAB — BASIC METABOLIC PANEL
BUN: 12 mg/dL (ref 6–23)
Calcium: 10 mg/dL (ref 8.4–10.5)
GFR calc Af Amer: >90 mL/min (ref >90)
GFR calc non Af Amer: 88 mL/min — ABNORMAL LOW (ref >90)
Glucose, Bld: 102 mg/dL — ABNORMAL HIGH (ref 70–99)
Potassium: 3.9 mEq/L (ref 3.5–5.1)

## 2012-07-13 NOTE — Progress Notes (Signed)
Subjective: This lady now has controlled atrial fibrillation. This is due to have a barium swallow to further assess her swallowing. Otherwise she has no complaints. Her speech is now normal. MRI brain scan did not show acute CVA. Bilateral carotid Dopplers are unremarkable.           Physical Exam: Blood pressure 113/93, pulse 67, temperature 97.4 F (36.3 C), temperature source Oral, resp. rate 16, height 5\' 2"  (1.575 m), weight 93.3 kg (205 lb 11 oz), SpO2 98.00%. She looks systemically well. Her ventricular rate is controlled. She remains in atrial fibrillation with an irregularly irregular rhythm. There are no murmurs. Lung fields are clear. There are no focal neurological signs.   Investigations:  Recent Results (from the past 240 hour(s))  MRSA PCR SCREENING     Status: Normal   Collection Time   07/11/12  3:07 PM      Component Value Range Status Comment   MRSA by PCR NEGATIVE  NEGATIVE Final      Basic Metabolic Panel:  Basename 07/13/12 0727 07/12/12 0450  NA 140 139  K 3.9 3.1*  CL 103 102  CO2 30 28  GLUCOSE 102* 88  BUN 12 14  CREATININE 0.76 0.67  CALCIUM 10.0 9.4  MG -- --  PHOS -- --   Liver Function Tests:  [facility] 07/12/12 0450  AST 14  ALT 14  ALKPHOS 75  BILITOT 0.2*  PROT 6.8  ALBUMIN 3.2*     CBC:  Basename 07/12/12 0450 07/11/12 1023  WBC 6.4 5.6  NEUTROABS -- 2.6  HGB 11.5* 12.1  HCT 34.8* 36.3  MCV 85.9 85.2  PLT 220 249    Ct Head Wo Contrast  07/11/2012  *RADIOLOGY REPORT*  Clinical Data: Altered mental status  CT HEAD WITHOUT CONTRAST  Technique:  Contiguous axial images were obtained from the base of the skull through the vertex without contrast.  Comparison: 05/22/2012  Findings: No skull fracture is noted.  Paranasal sinuses and mastoid air cells are unremarkable.  No intracranial hemorrhage, mass effect or midline shift.  Again noted cavum septum pellucidum.  No acute infarction.  No mass lesion is noted on this  unenhanced scan.  IMPRESSION: No acute intracranial abnormality.  No significant change.   Original Report Authenticated By: Natasha Mead, M.D.    Mr Brain Wo Contrast  07/12/2012  *RADIOLOGY REPORT*  Clinical Data: Chest palpitations.  Episodic speech difficulty which has now resolved.  History of hypertension.  History of diabetes.  MRI HEAD WITHOUT CONTRAST  Technique:  Multiplanar, multiecho pulse sequences of the brain and surrounding structures were obtained according to standard protocol without intravenous contrast.  Comparison: 07/11/2012.  Findings: There is no evidence for acute infarction, intracranial hemorrhage, mass lesion, hydrocephalus, or extra-axial fluid. Slight atrophy.  Mild chronic microvascular ischemic change noted in the periventricular regions.  Partial empty sella.  Flow voids are maintained in the major intracranial vessels.  No midline shift.  No foci of chronic hemorrhage.  Normal cerebellar tonsils and upper cervical region.  No worrisome osseous lesions.  Negative orbits and mastoids.  Mild chronic sinusitis.  Heavily calcified or ossified 1 cm falcine lesion in the parasagittal posterior frontal region, correlates with  CT, likely incidental falx ossification or small meningioma.  IMPRESSION: Mild atrophy.  Slight periventricular white matter signal abnormality, likely chronic microvascular ischemic change. No acute stroke.  No acute intracranial abnormality.  Partial empty sella.   Original Report Authenticated By: Davonna Belling, M.D.  US Carotid Duplex Bilateral  07/12/2012  *RADIOLOGY REPORT*  Clinical Data: TIA.  Hypertension, diabetes.  BILATERAL CAROTID DUPLEX ULTRASOUND  Technique: Wallace Cullens scale imaging, color Doppler and duplex ultrasound was performed of bilateral carotid and vertebral arteries in the neck.  Comparison:   04/26/2004  Criteria:  Quantification of carotid stenosis is based on velocity parameters that correlate the residual internal carotid diameter with  NASCET-based stenosis levels, using the diameter of the distal internal carotid lumen as the denominator for stenosis measurement.  The following velocity measurements were obtained:                   PEAK SYSTOLIC/END DIASTOLIC RIGHT ICA:                       64/18cm/sec CCA:                        65/15cm/sec SYSTOLIC ICA/CCA RATIO:      0.97 DIASTOLIC ICA/CCA RATIO:    1.2 ECA:                        70cm/sec  LEFT ICA:                        63/17cm/sec CCA:                        72/17cm/sec SYSTOLIC ICA/CCA RATIO:     0.87 DIASTOLIC ICA/CCA RATIO:    0.97 ECA:                        52cm/sec  Findings:  RIGHT CAROTID ARTERY: Mild diffuse intimal thickening in the common carotid artery.  Eccentric plaque in the carotid bulb and proximal ICA without high-grade stenosis.  Normal wave forms and color Doppler signal.  RIGHT VERTEBRAL ARTERY:  Normal flow direction and waveform.  LEFT CAROTID ARTERY: Irregular 3.4 cm left thyroid lesion incidentally noted.  Mild plaque in the carotid bulb without high- grade stenosis.  Normal wave forms and color Doppler signal.  LEFT VERTEBRAL ARTERY:  Normal flow direction and waveform.  IMPRESSION:  1.  Bilateral carotid bifurcation and proximal ICA plaque resulting in less than 50% diameter stenosis. The exam does not exclude plaque ulceration or embolization.  Continued surveillance recommended.  2.  3.4 cm left thyroid nodule. Consider further evaluation with thyroid ultrasound.  If patient is clinically hyperthyroid, consider nuclear medicine thyroid uptake and scan.   Original Report Authenticated By: D. Andria Rhein, MD    Dg Chest Portable 1 View  07/11/2012  *RADIOLOGY REPORT*  Clinical Data: Chest pain  PORTABLE CHEST - 1 VIEW  Comparison: 05/22/12  Findings: Cardiomegaly again noted.  No convincing pulmonary edema. Elevation of the right hemidiaphragm.  Mild left basilar atelectasis or infiltrate.  IMPRESSION: No convincing pulmonary edema.  Elevation of the right  hemidiaphragm.  Mild left basilar atelectasis or infiltrate.   Original Report Authenticated By: Natasha Mead, M.D.       Medications: I have reviewed the patient's current medications.  Impression: 1. Atrial fibrillation with rapid ventricular response, now rate controlled . 2. Possible TIA. 3. Hypertension. 4. Type 2 diabetes mellitus. 5. Obesity.     Plan: 1. Await barium swallow study this morning. 2. Possible discharge home today later on.     LOS: 2 days   Hiawatha Merriott C  Pager [phone]  07/13/2012, 8:45 AM
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
## 2012-07-13 NOTE — Progress Notes (Signed)
SUBJECTIVE:Complains of sore throat.   Principal Problem:  *TIA (transient ischemic attack) Active Problems:  HYPERTENSION  DM (diabetes mellitus), type 2  Atrial fibrillation with RVR  Basic Metabolic Panel:  Basename 07/13/12 0727 07/12/12 0450  NA 140 139  K 3.9 3.1*  CL 103 102  CO2 30 28  GLUCOSE 102* 88  BUN 12 14  CREATININE 0.76 0.67  CALCIUM 10.0 9.4  MG -- --  PHOS -- --   Liver Function Tests:  [facility] 07/12/12 0450  AST 14  ALT 14  ALKPHOS 75  BILITOT 0.2*  PROT 6.8  ALBUMIN 3.2*   CBC:  Basename 07/12/12 0450 07/11/12 1023  WBC 6.4 5.6  NEUTROABS -- 2.6  HGB 11.5* 12.1  HCT 34.8* 36.3  MCV 85.9 85.2  PLT 220 249   Cardiac Enzymes:  Basename 07/12/12 0022 07/11/12 1851 07/11/12 1303  CKTOTAL -- -- --  CKMB -- -- --  CKMBINDEX -- -- --  TROPONINI <0.30 <0.30 <0.30    RADIOLOGY: Ct Head Wo Contrast  07/11/2012  No acute intracranial abnormality.  No significant change.    Mr Brain Wo Contrast  07/12/2012  Mild atrophy.  Slight periventricular white matter signal abnormality, likely chronic microvascular ischemic change.  No acute intracranial abnormality.  Partial empty sella.   Original     US Carotid Duplex Bilateral  07/12/2012  Bilateral carotid bifurcation and proximal ICA plaque resulting in less than 50% diameter stenosis.  3.4 cm left thyroid nodule.   Dg Chest Portable 1 View  07/11/2012   No convincing pulmonary edema.  Elevation of the right hemidiaphragm.  Mild left basilar atelectasis or infiltrate. Mild cardiomegaly.  Echocardiogram 07/12/2012:  Normal LV size and function; no significant valvular abnormalities.   PHYSICAL EXAM BP 113/93  Pulse 67  Temp 97.4 F (36.3 C) (Oral)  Resp 16  Ht 5\' 2"  (1.575 m)  Wt 205 lb 11 oz (93.3 kg)  BMI 37.62 kg/m2  SpO2 98% General: Well developed, well nourished, in no acute distress Head: Eyes PERRLA, No xanthomas.   Normal cephalic and atramatic  Lungs: Clear bilaterally to  auscultation and percussion. Heart: HRIR S1 S2, No MRG .  Pulses are 2+ & equal.            No carotid bruit. No JVD.  No abdominal bruits. No femoral bruits. Abdomen: Bowel sounds are positive, abdomen soft and non-tender without masses or                  Hernia's noted. Msk:  Back normal, normal gait. Normal strength and tone for age. Extremities: No clubbing, cyanosis or edema.  DP +1 Neuro: Alert and oriented X 3. Psych:  Good affect, responds appropriately  TELEMETRY: Reviewed telemetry pt in: Atrial fib with RBBB, rates in the 80's-90's.   ASSESSMENT AND PLAN:  1. Atrial fibrillation with RVR: Heart rate is controlled currently with review of telemetry. Continues on Xarelto at 20 mg daily and both metoprolol and cardizem. Hemodynamically stable. Will order orthostatics, as she has been on bedrest primarily since admission. Echo results pending.   2. TIA: No focal deficits are seen on assessment. Speech is improved. Swallowing assessment today, per PTH.  3.  Hypertension:  Good control. As above, orthostatics today on multiple medications.  Bettey Mare. Lyman Bishop NP [facility] 07/13/2012, 8:38 AM  Cardiology Attending Patient interviewed and examined. Discussed with Joni Reining, NP.  Above note annotated and modified based upon my findings.  Symptoms  resolved. No evidence for a significant neurologic event. No significant underlying structural heart disease. Heart rate is adequately controlled with current medication, and anticoagulation should be adequate. We will arrange for continuing care in our office.  Leland Bing, MD 07/13/2012, 4:54 PM

## 2012-07-14 DIAGNOSIS — E669 Obesity, unspecified: Secondary | ICD-10-CM

## 2012-07-14 LAB — GLUCOSE, CAPILLARY: Glucose-Capillary: 80 mg/dL (ref 70–99)

## 2012-07-14 MED ORDER — DILTIAZEM HCL ER COATED BEADS 120 MG PO CP24: 120.0000 mg | ORAL_CAPSULE | Freq: Every day | ORAL | Status: DC

## 2012-07-14 NOTE — Discharge Summary (Signed)
Physician Discharge Summary  KAYDRA BORGEN ZOX:[id number] DOB: 11/29/1950 DOA: 07/11/2012  PCP: Syliva Overman, MD  Admit date: 07/11/2012 Discharge date: 07/14/2012  Time spent: Greater than 30 minutes  Recommendations for Outpatient Follow-up:  1. Follow up primary care physician in next 4 weeks. 2. Follow with Dr. Dietrich Pates, cardiology next couple weeks.   Discharge Diagnoses:  1. Atrial fibrillation with rapid ventricular response, ventricular rate controlled now but remains in atrial fibrillation. 2. Chronic anticoagulation with xeralto. 3. Possible TIA. No evidence of new CVA. 4. Hypertension. 5. Type 2 diabetes mellitus.   Discharge Condition: Stable and improved.  Diet recommendation: Dysphagia 3 mechanical soft diet with thin liquids.  Filed Weights   07/11/12 1015 07/11/12 1430 07/12/12 0500  Weight: 90.719 kg (200 lb) 94.2 kg (207 lb 10.8 oz) 93.3 kg (205 lb 11 oz)    History of present illness:  This 62 year old lady presents to the hospital with symptoms of palpitations and dysphasia. Please see initial history as outlined below: HPI: Samantha Vega is a 62 y.o. female presented to the hospital today having had sudden onset of palpitations at 9 AM. This was followed approximately 15-20 minutes later by an episode of difficulty with speech. She was unable to say what she wanted to say. This episode lasted approximately another 15-20 minutes. Now her symptoms have resolved. Her palpitations are better since she is on a Cardizem drip for her atrial fibrillation with rapid ventricular response. She denies any significant chest pain, dyspnea or loss of consciousness. She was started on xeralto when she was in Dr. Marvel Plan office in December and has been compliant. She is diabetic, hypertensive.  Hospital Course:  Patient was admitted to the step down unit because on admission she was in a rapid atrial fibrillation and required a Cardizem drip. However, her ventricular  rate improved and was controlled very quickly within 12 hours. Unfortunately, she did not go back into sinus rhythm. She already was anticoagulated with xeralto. Also, she had episode of speech difficulty as mentioned above. MRI brain scan did not show acute CVA. Bilateral carotid Dopplers were unremarkable. Her speech has come back to normal within 24 hours. She apparently had issues with swallowing and a formal swallowing evaluation was done with a barium swallow yesterday. This did not show any evidence of aspiration or any penetration. The full report is as follows: Dysphagia Diagnosis: Suspected primary esophageal dysphagia;Mild oral phase dysphagia  Clinical impression: Mild oral phase dysphagia in setting of missing dentition with slow, labored oral prep with solids. Decreased esophageal motility resulting in significant residuals/delayed emptying in esophagus. Once esophagus was filled, pt took sips liquid and gagged/choked/coughed excessively, however no penetration or aspiration observed. Esophagus did clear out and pill passed easily. Continue soft diet and think liquids with reflux precautions, take small sips. The recommendation is for a dysphagia 3 mechanical soft diet with thin liquids. Patient is now keen to go home and I agree with her. She has been seen by Dr. Dietrich Pates, cardiology as an inpatient to once to follow her in the outpatient setting to consider further management of her atrial fibrillation.  Procedures:  Echocardiogram:  Study Conclusions  - Left ventricle: The cavity size was normal. There was mild basal hypertrophy of the septum. Systolic function was normal. The estimated ejection fraction was in the range of 55% to 60%. Wall motion was normal; there were no regional wall motion abnormalities. - Aortic valve: Mildly calcified annulus. Trileaflet. - Mitral valve: Calcified annulus. Transthoracic echocardiography.  M-mode, complete 2D, spectral Doppler, and color  Doppler. Height: Height: 157.5cm. Height: 62in. Weight: Weight: 93kg. Weight: 204.6lb. Body mass index: BMI: 37.5kg/m^2. Body surface area: BSA: 1.32m^2. Patient status: Inpatient. Location: ICU/CCU   Consultations:  Cardiology, Dr. Dietrich Pates.  Discharge Exam: Filed Vitals:   07/13/12 1623 07/13/12 2108 07/13/12 2303 07/14/12 0543  BP:  116/73 126/77 143/89  Pulse: 102 66 79 88  Temp:  97.5 F (36.4 C)  97.7 F (36.5 C)  TempSrc:  Oral  Oral  Resp:  18  20  Height:      Weight:      SpO2: 95% 98%  97%    General: She looks systemically well. She is obese. Cardiovascular: Heart sounds are in atrial fibrillation. There is no evidence clinically of heart failure. Respiratory: Lung fields are clear. She is alert and orientated. Today there are no speech difficulties whatsoever. There are no focal neurological signs  Discharge Instructions  Discharge Orders    Future Appointments: Provider: Department: Dept Phone: Center:   09/02/2012 1:10 PM [facility] [place] [phone] [facility]   09/02/2012 1:15 PM Kathlen Brunswick, MD [facility] [place] [phone] [facility]   09/23/2012 9:00 AM Kerri Perches, MD [facility] [phone] [facility]   05/05/2013 9:30 AM Vickki Hearing, MD [facility] [phone] [facility]     Future Orders Please Complete By Expires   Diet - low sodium heart healthy      Increase activity slowly          Medication List     As of 07/14/2012 10:43 AM    STOP taking these medications         ibuprofen 600 MG tablet   Commonly known as: ADVIL,MOTRIN      nabumetone 500 MG tablet   Commonly known as: RELAFEN      TAKE these medications         aspirin EC 81 MG tablet   Take 81 mg by mouth daily.      calcium-vitamin D 500 MG tablet   Take 1 tablet by mouth 2 (two) times daily.      [employer]S SPECTRAVITE PO   Take 1 tablet by mouth daily.       diltiazem 120 MG 24 hr capsule   Commonly known as: CARDIZEM CD   Take 1 capsule (120 mg total) by mouth daily.      metFORMIN 500 MG tablet   Commonly known as: GLUCOPHAGE   Take 500 mg by mouth 2 (two) times daily with a meal.      metoprolol succinate 50 MG 24 hr tablet   Commonly known as: TOPROL-XL   Take 1 tablet (50 mg total) by mouth 2 (two) times daily. Dose increase      predniSONE 5 MG tablet   Commonly known as: DELTASONE   Take 5 mg by mouth daily.      Rivaroxaban 20 MG Tabs   Commonly known as: XARELTO   Take 1 tablet (20 mg total) by mouth daily.      rosuvastatin 20 MG tablet   Commonly known as: CRESTOR   Take 1 tablet (20 mg total) by mouth at bedtime.      traMADol-acetaminophen 37.5-325 MG per tablet   Commonly known as: ULTRACET   Take 1 tablet by mouth every 6 (six) hours as needed. Pain.           Follow-up Information    Follow  up with Syliva Overman, MD. Schedule an appointment as soon as possible for a visit in 4 weeks.   Contact information:   [address] [place] [address] [phone]       Follow up with [place] Bing, MD. Schedule an appointment as soon as possible for a visit in 2 weeks.   Contact information:   [address] [place] [address] [phone]           The results of significant diagnostics from this hospitalization (including imaging, microbiology, ancillary and laboratory) are listed below for reference.    Significant Diagnostic Studies: Ct Head Wo Contrast  07/11/2012  *RADIOLOGY REPORT*  Clinical Data: Altered mental status  CT HEAD WITHOUT CONTRAST  Technique:  Contiguous axial images were obtained from the base of the skull through the vertex without contrast.  Comparison: 05/22/2012  Findings: No skull fracture is noted.  Paranasal sinuses and mastoid air cells are unremarkable.  No intracranial hemorrhage, mass effect or midline shift.  Again noted cavum septum pellucidum.  No  acute infarction.  No mass lesion is noted on this unenhanced scan.  IMPRESSION: No acute intracranial abnormality.  No significant change.   Original Report Authenticated By: Natasha Mead, M.D.    Mr Brain Wo Contrast  07/12/2012  *RADIOLOGY REPORT*  Clinical Data: Chest palpitations.  Episodic speech difficulty which has now resolved.  History of hypertension.  History of diabetes.  MRI HEAD WITHOUT CONTRAST  Technique:  Multiplanar, multiecho pulse sequences of the brain and surrounding structures were obtained according to standard protocol without intravenous contrast.  Comparison: 07/11/2012.  Findings: There is no evidence for acute infarction, intracranial hemorrhage, mass lesion, hydrocephalus, or extra-axial fluid. Slight atrophy.  Mild chronic microvascular ischemic change noted in the periventricular regions.  Partial empty sella.  Flow voids are maintained in the major intracranial vessels.  No midline shift.  No foci of chronic hemorrhage.  Normal cerebellar tonsils and upper cervical region.  No worrisome osseous lesions.  Negative orbits and mastoids.  Mild chronic sinusitis.  Heavily calcified or ossified 1 cm falcine lesion in the parasagittal posterior frontal region, correlates with  CT, likely incidental falx ossification or small meningioma.  IMPRESSION: Mild atrophy.  Slight periventricular white matter signal abnormality, likely chronic microvascular ischemic change. No acute stroke.  No acute intracranial abnormality.  Partial empty sella.   Original Report Authenticated By: Davonna Belling, M.D.    US Carotid Duplex Bilateral  07/12/2012  *RADIOLOGY REPORT*  Clinical Data: TIA.  Hypertension, diabetes.  BILATERAL CAROTID DUPLEX ULTRASOUND  Technique: Wallace Cullens scale imaging, color Doppler and duplex ultrasound was performed of bilateral carotid and vertebral arteries in the neck.  Comparison:   04/26/2004  Criteria:  Quantification of carotid stenosis is based on velocity parameters that  correlate the residual internal carotid diameter with NASCET-based stenosis levels, using the diameter of the distal internal carotid lumen as the denominator for stenosis measurement.  The following velocity measurements were obtained:                   PEAK SYSTOLIC/END DIASTOLIC RIGHT ICA:                       64/18cm/sec CCA:                        65/15cm/sec SYSTOLIC ICA/CCA RATIO:      0.97 DIASTOLIC ICA/CCA RATIO:    1.2  ECA:                        70cm/sec  LEFT ICA:                        63/17cm/sec CCA:                        72/17cm/sec SYSTOLIC ICA/CCA RATIO:     0.87 DIASTOLIC ICA/CCA RATIO:    0.97 ECA:                        52cm/sec  Findings:  RIGHT CAROTID ARTERY: Mild diffuse intimal thickening in the common carotid artery.  Eccentric plaque in the carotid bulb and proximal ICA without high-grade stenosis.  Normal wave forms and color Doppler signal.  RIGHT VERTEBRAL ARTERY:  Normal flow direction and waveform.  LEFT CAROTID ARTERY: Irregular 3.4 cm left thyroid lesion incidentally noted.  Mild plaque in the carotid bulb without high- grade stenosis.  Normal wave forms and color Doppler signal.  LEFT VERTEBRAL ARTERY:  Normal flow direction and waveform.  IMPRESSION:  1.  Bilateral carotid bifurcation and proximal ICA plaque resulting in less than 50% diameter stenosis. The exam does not exclude plaque ulceration or embolization.  Continued surveillance recommended.  2.  3.4 cm left thyroid nodule. Consider further evaluation with thyroid ultrasound.  If patient is clinically hyperthyroid, consider nuclear medicine thyroid uptake and scan.   Original Report Authenticated By: D. Andria Rhein, MD    Dg Chest Portable 1 View  07/11/2012  *RADIOLOGY REPORT*  Clinical Data: Chest pain  PORTABLE CHEST - 1 VIEW  Comparison: 05/22/12  Findings: Cardiomegaly again noted.  No convincing pulmonary edema. Elevation of the right hemidiaphragm.  Mild left basilar atelectasis or infiltrate.  IMPRESSION: No  convincing pulmonary edema.  Elevation of the right hemidiaphragm.  Mild left basilar atelectasis or infiltrate.   Original Report Authenticated By: Natasha Mead, M.D.    Dg Swallowing Func-speech Pathology  07/13/2012  Dorene Ar, CCC-SLP     07/13/2012  3:05 PM Objective Swallowing Evaluation: Modified Barium Swallowing Study   Patient Details  Name: Samantha Vega MRN: [id number] Date of Birth: 1951/03/17  Today's Date: 07/13/2012 Time: 0347-4259 SLP Time Calculation (min): 22 min  Past Medical History:  Past Medical History  Diagnosis Date  . Right bundle branch block     +Palpitations.   EKG 11/2006:  NSR, RBBB, LAFB, markedly delayed  R-wave progression, no change; Echocardiogram in  10/2006-suboptimal quality, normal; Event recorder-PVCs, no  significant arrhythmias or symptoms ;  . Obesity   . Hyperlipidemia   . Hypertension   . Diabetes mellitus   . Chest pain     Noncardiac; evaluated and treated in 2008  . Enlarged heart    Past Surgical History:  Past Surgical History  Procedure Date  . Total abdominal hysterectomy w/ bilateral salpingoophorectomy   . Shoulder surgery 2006    Left-post-trauma   HPI:  62 yo female admitted with stroke-like symptoms yesterday. She  failed RN swallow screen in ED because she gagged and coughed up  crackers and water. She did the same during BSE yesterday with  SLP so after discussion with MD, MBSS was ordered.     Assessment / Plan / Recommendation Clinical Impression  Dysphagia Diagnosis: Suspected primary esophageal dysphagia;Mild  oral phase dysphagia Clinical impression: Mild oral phase  dysphagia in setting of  missing dentition with slow, labored oral prep with solids.  Decreased esophageal motility resulting in significant  residuals/delayed emptying in esophagus. Once esophagus was  filled, pt took sips liquid and gagged/choked/coughed  excessively, however no penetration or aspiration observed.  Esophagus did clear out and pill passed easily. Continue soft  diet  and think liquids with reflux precautions, take small sips.    Treatment Recommendation  No treatment recommended at this time    Diet Recommendation Dysphagia 3 (Mechanical Soft);Thin liquid   Liquid Administration via: Cup;Straw (small sips) Medication Administration: Whole meds with liquid Supervision: Patient able to self feed Compensations: Small sips/bites;Slow rate Postural Changes and/or Swallow Maneuvers: Out of bed for  meals;Seated upright 90 degrees;Upright 30-60 min after meal    Other  Recommendations Recommended Consults: Consider esophageal  assessment Oral Care Recommendations: Oral care BID;Patient independent with  oral care Other Recommendations: Clarify dietary restrictions   Follow Up Recommendations  None            General Date of Onset: 07/11/12 HPI: 62 yo female admitted with stroke-like symptoms yesterday.  She failed RN swallow screen in ED because she gagged and coughed  up crackers and water. She did the same during BSE yesterday with  SLP so after discussion with MD, MBSS was ordered. Type of Study: Modified Barium Swallowing Study Reason for Referral: Objectively evaluate swallowing function Previous Swallow Assessment: BSE 1/27 Diet Prior to this Study: Dysphagia 3 (soft);Thin liquids Temperature Spikes Noted: No Respiratory Status: Room air Behavior/Cognition: Alert;Cooperative;Pleasant mood (flat affect) Oral Cavity - Dentition: Missing dentition;Poor condition Oral Motor / Sensory Function: Within functional limits (however  asymmetry left side) Self-Feeding Abilities: Able to feed self Patient Positioning: Upright in chair Baseline Vocal Quality: Clear Volitional Cough: Strong Volitional Swallow: Able to elicit Anatomy: Within functional limits Pharyngeal Secretions: Not observed secondary MBS    Reason for Referral Objectively evaluate swallowing function   Oral Phase Oral Preparation/Oral Phase Oral Phase: Impaired Oral - Solids Oral - Regular: Impaired mastication;Delayed oral   transit;Piecemeal swallowing Oral Phase - Comment Oral Phase - Comment: Pt occasionally hesitates with bolus orally  before sending over base of tongue.   Pharyngeal Phase Pharyngeal Phase Pharyngeal Phase: Impaired Pharyngeal - Thin Pharyngeal - Thin Straw: Premature spillage to pyriform sinuses Pharyngeal Phase - Comment Pharyngeal Comment: Essentially WFL, however premature spillage  to pyriforms noted with sequential straw sips thin, no  penetration.  Cervical Esophageal Phase       Cervical Esophageal Phase Cervical Esophageal Phase: Impaired Cervical Esophageal Phase - Solids Mechanical Soft: Esophageal backflow into cervical esophagus Cervical Esophageal Phase - Comment Cervical Esophageal Comment: Delayed emptying of esophagus,  decreased motility        Thank you,  Havery Moros, CCC-SLP [phone]  PORTER,DABNEY 07/13/2012, 3:04 PM      Microbiology: Recent Results (from the past 240 hour(s))  MRSA PCR SCREENING     Status: Normal   Collection Time   07/11/12  3:07 PM      Component Value Range Status Comment   MRSA by PCR NEGATIVE  NEGATIVE Final      Labs: Basic Metabolic Panel:  Lab 07/13/12 0981 07/12/12 0450 07/11/12 1023  NA 140 139 139  K 3.9 3.1* 3.3*  CL 103 102 102  CO2 30 28 26   GLUCOSE 102* 88 118*  BUN 12 14 14   CREATININE 0.76 0.67 0.72  CALCIUM 10.0 9.4 10.1  MG -- -- --  PHOS -- -- --  Liver Function Tests:  Lab 07/12/12 0450  AST 14  ALT 14  ALKPHOS 75  BILITOT 0.2*  PROT 6.8  ALBUMIN 3.2*     CBC:  Lab 07/12/12 0450 07/11/12 1023  WBC 6.4 5.6  NEUTROABS -- 2.6  HGB 11.5* 12.1  HCT 34.8* 36.3  MCV 85.9 85.2  PLT 220 249   Cardiac Enzymes:  Lab 07/12/12 0022 07/11/12 1851 07/11/12 1303 07/11/12 1023  CKTOTAL -- -- -- --  CKMB -- -- -- --  CKMBINDEX -- -- -- --  TROPONINI <0.30 <0.30 <0.30 <0.30     CBG:  Lab 07/14/12 0735 07/13/12 2107 07/13/12 1708 07/13/12 1134 07/13/12 0758  GLUCAP 105* 109* 93 122* 94        Signed:  GOSRANI,NIMISH C  [facility] 07/14/2012, 10:43 AM

## 2012-07-14 NOTE — Progress Notes (Signed)
Discharge summary: a/o.vss. Saline lock removed. Discharge instructions given. Pt verbalized understanding of instructions. Left floor via wheelchair with nursing staff and family member.

## 2012-07-14 NOTE — Care Management Note (Addendum)
    Page 1 of 1   07/14/2012     2:26:41 PM   CARE MANAGEMENT NOTE 07/14/2012  Patient:  Samantha Vega, Samantha Vega   Account Number:  [id number]  Date Initiated:  07/14/2012  Documentation initiated by:  Rosemary Holms  Subjective/Objective Assessment:   Pt admitted from home. No hh needs identifed     Action/Plan:   Anticipated DC Date:  07/14/2012   Anticipated DC Plan:  HOME/SELF CARE      DC Planning Services  CM consult      Choice offered to / List presented to:             Status of service:  Completed, signed off Medicare Important Message given?   (If response is "NO", the following Medicare IM given date fields will be blank) Date Medicare IM given:   Date Additional Medicare IM given:    Discharge Disposition:  HOME/SELF CARE  Per UR Regulation:    If discussed at Long Length of Stay Meetings, dates discussed:    Comments:  07/14/12 Rosemary Holms RN BSN CM

## 2012-07-14 NOTE — Progress Notes (Signed)
UR Chart Review Completed  

## 2012-07-24 ENCOUNTER — Other Ambulatory Visit: Payer: Self-pay | Admitting: Family Medicine

## 2012-07-29 ENCOUNTER — Encounter: Payer: PRIVATE HEALTH INSURANCE | Admitting: Cardiology

## 2012-08-03 LAB — HEMOGLOBIN A1C: Mean Plasma Glucose: 134 mg/dL — ABNORMAL HIGH (ref <117)

## 2012-08-04 ENCOUNTER — Ambulatory Visit: Payer: PRIVATE HEALTH INSURANCE | Admitting: Family Medicine

## 2012-09-02 ENCOUNTER — Encounter (INDEPENDENT_AMBULATORY_CARE_PROVIDER_SITE_OTHER): Payer: PRIVATE HEALTH INSURANCE | Admitting: Cardiology

## 2012-09-02 ENCOUNTER — Other Ambulatory Visit: Payer: Self-pay | Admitting: *Deleted

## 2012-09-02 DIAGNOSIS — I4891 Unspecified atrial fibrillation: Secondary | ICD-10-CM

## 2012-09-02 DIAGNOSIS — Z7901 Long term (current) use of anticoagulants: Secondary | ICD-10-CM

## 2012-09-05 NOTE — Progress Notes (Signed)
This encounter was created in error - please disregard.

## 2012-09-16 ENCOUNTER — Ambulatory Visit (INDEPENDENT_AMBULATORY_CARE_PROVIDER_SITE_OTHER): Payer: PRIVATE HEALTH INSURANCE | Admitting: Cardiology

## 2012-09-16 ENCOUNTER — Encounter: Payer: Self-pay | Admitting: Cardiology

## 2012-09-16 ENCOUNTER — Ambulatory Visit (INDEPENDENT_AMBULATORY_CARE_PROVIDER_SITE_OTHER): Payer: PRIVATE HEALTH INSURANCE | Admitting: *Deleted

## 2012-09-16 VITALS — BP 136/92 | HR 95 | Ht 61.5 in | Wt 206.0 lb

## 2012-09-16 DIAGNOSIS — I480 Paroxysmal atrial fibrillation: Secondary | ICD-10-CM

## 2012-09-16 DIAGNOSIS — Z7901 Long term (current) use of anticoagulants: Secondary | ICD-10-CM

## 2012-09-16 DIAGNOSIS — E119 Type 2 diabetes mellitus without complications: Secondary | ICD-10-CM

## 2012-09-16 DIAGNOSIS — I10 Essential (primary) hypertension: Secondary | ICD-10-CM

## 2012-09-16 DIAGNOSIS — E785 Hyperlipidemia, unspecified: Secondary | ICD-10-CM

## 2012-09-16 DIAGNOSIS — I4891 Unspecified atrial fibrillation: Secondary | ICD-10-CM

## 2012-09-16 LAB — BASIC METABOLIC PANEL
Calcium: 9.4 mg/dL (ref 8.4–10.5)
Sodium: 141 mEq/L (ref 135–145)

## 2012-09-16 LAB — CBC
HCT: 34.7 % — ABNORMAL LOW (ref 36.0–46.0)
Hemoglobin: 11.6 g/dL — ABNORMAL LOW (ref 12.0–15.0)
RBC: 4.29 MIL/uL (ref 3.87–5.11)
WBC: 5.3 10*3/uL (ref 4.0–10.5)

## 2012-09-16 MED ORDER — METOPROLOL TARTRATE 100 MG PO TABS: 100.0000 mg | ORAL_TABLET | Freq: Two times a day (BID) | ORAL | Status: DC

## 2012-09-16 NOTE — Assessment & Plan Note (Signed)
Blood pressure control has been good for at least the past 3 months. As we increase medication to manage atrial fibrillation, BP should only improve.

## 2012-09-16 NOTE — Assessment & Plan Note (Addendum)
Adequate control of hyperlipidemia when last assessed less than one year ago.  Patient receiving appropriate statin therapy in view of her history of diabetes.

## 2012-09-16 NOTE — Patient Instructions (Addendum)
Your physician recommends that you schedule a follow-up appointment in: 3 months  Your physician recommends that you return for lab work in: TODAY  Your physician has recommended you make the following change in your medication:  1 - INCREASE Metoprolol to 100 mg twice a day

## 2012-09-16 NOTE — Progress Notes (Signed)
Patient ID: Samantha Vega, female   DOB: 05-21-1951, 62 y.o.   MRN: [id number]  HPI: Schedule return visit for this pleasant woman with the recent onset of atrial fibrillation. She was hospitalized in both December and January for palpitations with some chest discomfort.  She recently experienced an episode of severe upper substernal chest pain with a sharp quality but with no associated symptoms. Discomfort faded, but there was residual chest wall soreness. She has also had pain in her ankles, but is able to walk at a moderate pace without problems.  Current Outpatient Prescriptions  Medication Sig Dispense Refill  . aspirin EC 81 MG tablet Take 81 mg by mouth daily.      . calcium-vitamin D (OSCAL 500/200 D-3) 500 MG tablet Take 1 tablet by mouth 2 (two) times daily.       . CRESTOR 20 MG tablet TAKE 1 TABLET (20 MG TOTAL) BY MOUTH AT BEDTIME.  90 tablet  0  . diltiazem (CARDIZEM CD) 120 MG 24 hr capsule Take 1 capsule (120 mg total) by mouth daily.  30 capsule  0  . metFORMIN (GLUCOPHAGE) 500 MG tablet Take 500 mg by mouth 2 (two) times daily with a meal.      . metFORMIN (GLUCOPHAGE) 500 MG tablet TAKE 1 TABLET BY MOUTH 2 TIMES DAILY WITH A MEAL.  180 tablet  1  . metoprolol succinate (TOPROL-XL) 50 MG 24 hr tablet Take 1 tablet (50 mg total) by mouth 2 (two) times daily. Dose increase  60 tablet  0  . Multiple Vitamins-Minerals (CVS SPECTRAVITE PO) Take 1 tablet by mouth daily.      . predniSONE (DELTASONE) 5 MG tablet Take 5 mg by mouth daily.       . Rivaroxaban (XARELTO) 20 MG TABS Take 1 tablet (20 mg total) by mouth daily.  30 tablet  3  . rosuvastatin (CRESTOR) 20 MG tablet Take 1 tablet (20 mg total) by mouth at bedtime.  90 tablet  1  . traMADol-acetaminophen (ULTRACET) 37.5-325 MG per tablet Take 1 tablet by mouth every 6 (six) hours as needed. Pain.       No current facility-administered medications for this visit.    No Known Allergies   Past medical history, social history,  and family history reviewed and updated.  ROS: Denies dyspnea, orthopnea, PND or pedal edema. She's had no dizziness nor loss of consciousness. She notes no melena nor hematemesis. All other systems reviewed and are negative.  PHYSICAL EXAM: BP 136/92  Pulse 95  Ht 5' 1.5" (1.562 m)  Wt 93.441 kg (206 lb)  BMI 38.3 kg/m2  SpO2 95%;  Body mass index is 38.3 kg/(m^2). General-Well developed; no acute distress Body habitus- moderately overweight t Neck-No JVD; no carotid bruits Lungs-clear lung fields; resonant to percussion Cardiovascular-normal PMI; normal S1 and S2; regular rhythm  Abdomen-normal bowel sounds; soft and non-tender without masses or organomegaly Musculoskeletal-No deformities, no cyanosis or clubbing Neurologic-Normal cranial nerves; symmetric strength and tone Skin-Warm, no significant lesions Extremities-distal pulses intact; no edema  Rhythm Strip: Sinus tachycardia with PACs and an IVCD  [place] Bing, MD 09/16/2012  2:56 PM  ASSESSMENT AND PLAN

## 2012-09-16 NOTE — Patient Instructions (Addendum)
Pt in today to see me for Zarelto follow up. (Atrial fib/TIA)  She denies any s/s of bleeding or GI upset.  Indication and adverse effects discussed with pt again and she has clear understanding.  She was sent to lab today for CBC and BMP.  Pt saw Dr Dietrich Pates today as well. Labs: 1/28  BUN 12  Cr. 0.76      09/16/12  BUN  16  Cr. 0.70           1/27  Hgb  11.5                              Hgb 11.6                    HCT  34.8                             Hct  34.7 Labs reviewed and stable.

## 2012-09-16 NOTE — Assessment & Plan Note (Signed)
Patient has converted back to sinus rhythm since hospitalization in January. Dose of metoprolol will be increased to a total of 200 mg per day.

## 2012-09-16 NOTE — Assessment & Plan Note (Signed)
Patient is tolerating anticoagulation well. We will continue to exclude occult GI blood loss with serial FOBT and CBCs.

## 2012-09-16 NOTE — Progress Notes (Deleted)
Name: Samantha Vega    DOB: [birth year]  Age: 62 y.o.  MR#: [id number]       PCP:  Syliva Overman, MD      Insurance: Payor: MEDCOST  Plan: MEDCOST  Product Type: *No Product type*    CC:   No chief complaint on file.  MEDICATION LIST  VS Filed Vitals:   09/16/12 1434  BP: 136/92  Pulse: 95  Height: 5' 1.5" (1.562 m)  Weight: 206 lb (93.441 kg)  SpO2: 95%    Weights Current Weight  09/16/12 206 lb (93.441 kg)  07/12/12 205 lb 11 oz (93.3 kg)  06/02/12 205 lb (92.987 kg)    Blood Pressure  BP Readings from Last 3 Encounters:  09/16/12 136/92  07/14/12 143/89  06/02/12 100/70     Admit date:  (Not on file) Last encounter with RMR:  07/29/2012   Allergy Review of patient's allergies indicates no known allergies.  Current Outpatient Prescriptions  Medication Sig Dispense Refill  . aspirin EC 81 MG tablet Take 81 mg by mouth daily.      . calcium-vitamin D (OSCAL 500/200 D-3) 500 MG tablet Take 1 tablet by mouth 2 (two) times daily.       . CRESTOR 20 MG tablet TAKE 1 TABLET (20 MG TOTAL) BY MOUTH AT BEDTIME.  90 tablet  0  . diltiazem (CARDIZEM CD) 120 MG 24 hr capsule Take 1 capsule (120 mg total) by mouth daily.  30 capsule  0  . metFORMIN (GLUCOPHAGE) 500 MG tablet Take 500 mg by mouth 2 (two) times daily with a meal.      . metFORMIN (GLUCOPHAGE) 500 MG tablet TAKE 1 TABLET BY MOUTH 2 TIMES DAILY WITH A MEAL.  180 tablet  1  . metoprolol succinate (TOPROL-XL) 50 MG 24 hr tablet Take 1 tablet (50 mg total) by mouth 2 (two) times daily. Dose increase  60 tablet  0  . Multiple Vitamins-Minerals ([employer] SPECTRAVITE PO) Take 1 tablet by mouth daily.      . predniSONE (DELTASONE) 5 MG tablet Take 5 mg by mouth daily.       . Rivaroxaban (XARELTO) 20 MG TABS Take 1 tablet (20 mg total) by mouth daily.  30 tablet  3  . rosuvastatin (CRESTOR) 20 MG tablet Take 1 tablet (20 mg total) by mouth at bedtime.  90 tablet  1  . traMADol-acetaminophen (ULTRACET) 37.5-325 MG per tablet  Take 1 tablet by mouth every 6 (six) hours as needed. Pain.       No current facility-administered medications for this visit.    Discontinued Meds:   There are no discontinued medications.  Patient Active Problem List  Diagnosis  . HYPERLIPIDEMIA  . Unspecified visual loss  . HYPERTENSION  . Heart palpitations  . RBBB (right bundle branch block)  . Obesity  . DM (diabetes mellitus), type 2  . Knee pain, bilateral  . Thoracic back pain  . Acute right flank pain  . OA (osteoarthritis) of knee  . Atrial fibrillation with RVR  . TIA (transient ischemic attack)    LABS    Component Value Date/Time   NA 140 07/13/2012 0727   NA 139 07/12/2012 0450   NA 139 07/11/2012 1023   K 3.9 07/13/2012 0727   K 3.1* 07/12/2012 0450   K 3.3* 07/11/2012 1023   CL 103 07/13/2012 0727   CL 102 07/12/2012 0450   CL 102 07/11/2012 1023   CO2 30 07/13/2012 0727  CO2 28 07/12/2012 0450   CO2 26 07/11/2012 1023   GLUCOSE 102* 07/13/2012 0727   GLUCOSE 88 07/12/2012 0450   GLUCOSE 118* 07/11/2012 1023   BUN 12 07/13/2012 0727   BUN 14 07/12/2012 0450   BUN 14 07/11/2012 1023   CREATININE 0.76 07/13/2012 0727   CREATININE 0.67 07/12/2012 0450   CREATININE 0.72 07/11/2012 1023   CREATININE 0.72 04/16/2012 0932   CREATININE 0.72 08/14/2011 1005   CREATININE 0.67 04/15/2011 0820   CALCIUM 10.0 07/13/2012 0727   CALCIUM 9.4 07/12/2012 0450   CALCIUM 10.1 07/11/2012 1023   GFRNONAA 88* 07/13/2012 0727   GFRNONAA >90 07/12/2012 0450   GFRNONAA 89* 07/11/2012 1023   GFRAA >90 07/13/2012 0727   GFRAA >90 07/12/2012 0450   GFRAA >90 07/11/2012 1023   CMP     Component Value Date/Time   NA 140 07/13/2012 0727   K 3.9 07/13/2012 0727   CL 103 07/13/2012 0727   CO2 30 07/13/2012 0727   GLUCOSE 102* 07/13/2012 0727   BUN 12 07/13/2012 0727   CREATININE 0.76 07/13/2012 0727   CREATININE 0.72 04/16/2012 0932   CALCIUM 10.0 07/13/2012 0727   PROT 6.8 07/12/2012 0450   ALBUMIN 3.2* 07/12/2012 0450   AST 14 07/12/2012 0450    ALT 14 07/12/2012 0450   ALKPHOS 75 07/12/2012 0450   BILITOT 0.2* 07/12/2012 0450   GFRNONAA 88* 07/13/2012 0727   GFRAA >90 07/13/2012 0727       Component Value Date/Time   WBC 6.4 07/12/2012 0450   WBC 5.6 07/11/2012 1023   WBC 6.1 05/23/2012 0417   HGB 11.5* 07/12/2012 0450   HGB 12.1 07/11/2012 1023   HGB 11.5* 05/23/2012 0417   HCT 34.8* 07/12/2012 0450   HCT 36.3 07/11/2012 1023   HCT 34.1* 05/23/2012 0417   MCV 85.9 07/12/2012 0450   MCV 85.2 07/11/2012 1023   MCV 84.0 05/23/2012 0417    Lipid Panel     Component Value Date/Time   CHOL 154 04/16/2012 0932   TRIG 94 04/16/2012 0932   HDL 35* 04/16/2012 0932   CHOLHDL 4.4 04/16/2012 0932   VLDL 19 04/16/2012 0932   LDLCALC 100* 04/16/2012 0932    ABG No results found for this basename: phart, pco2, pco2art, po2, po2art, hco3, tco2, acidbasedef, o2sat     Lab Results  Component Value Date   TSH 0.756 07/13/2012   BNP (last 3 results) No results found for this basename: PROBNP,  in the last 8760 hours Cardiac Panel (last 3 results) No results found for this basename: CKTOTAL, CKMB, TROPONINI, RELINDX,  in the last 72 hours  Iron/TIBC/Ferritin No results found for this basename: iron, tibc, ferritin     EKG Orders placed during the hospital encounter of 07/11/12  . ED EKG  . ED EKG  . EKG 12-LEAD  . EKG 12-LEAD  . EKG 12-LEAD  . EKG 12-LEAD  . EKG 12-LEAD  . EKG 12-LEAD  . EKG     Prior Assessment and Plan Problem List as of 09/16/2012     ICD-9-CM     Cardiology Problems   HYPERLIPIDEMIA   Last Assessment & Plan   04/16/2012 Office Visit Written 04/17/2012 12:16 PM by Kerri Perches, MD     Hyperlipidemia:Low fat diet discussed and encouraged.  Updated labs to be obtained    HYPERTENSION   Last Assessment & Plan   06/02/2012 Office Visit Written 06/02/2012  1:27 PM by Dyann Kief, PA  Blood pressure is on a low side today. Her metoprolol was increased in the hospital. She was supposed to stop  benazepril- hydrochlorothiazide but she isn't sure she did this. I recommend she stop this if she is taking it. She will verify this.    RBBB (right bundle branch block)   Atrial fibrillation with RVR   Last Assessment & Plan   06/02/2012 Office Visit Edited 06/02/2012  1:28 PM by Dyann Kief, PA     Patient has a long history of palpitations but this is the first time she's had documented atrial fibrillation. She has had no further palpitations since discharge. I discussed this patient in detail with Dr. Smackover Bing who recommends beginning Xarelto.her creatinine clearance is 52 so we'll we'll start 20 mg daily. She will verify what medication she is taking currently.She will follow up with Dr. Dietrich Pates and Vashti Hey in 3 months.    TIA (transient ischemic attack)     Other   Unspecified visual loss   Heart palpitations   Last Assessment & Plan   04/16/2012 Office Visit Written 04/17/2012 12:17 PM by Kerri Perches, MD     Controlled ion medication    Obesity   Last Assessment & Plan   04/16/2012 Office Visit Written 04/17/2012 12:15 PM by Kerri Perches, MD     Improved. Pt applauded on succesful weight loss through lifestyle change, and encouraged to continue same. Weight loss goal set for the next several months.     DM (diabetes mellitus), type 2   Last Assessment & Plan   04/16/2012 Office Visit Written 04/17/2012 12:16 PM by Kerri Perches, MD     Patient advised to reduce carb and sweets, commit to regular physical activity, take meds as prescribed, test blood as directed, and attempt to lose weight, to improve blood sugar control. Updated lab needed    Knee pain, bilateral   Last Assessment & Plan   04/16/2012 Office Visit Written 04/17/2012 12:16 PM by Kerri Perches, MD     Progressive with instability, refer to ortho    Thoracic back pain   Last Assessment & Plan   05/03/2012 Office Visit Written 05/03/2012  1:05 PM by Kerri Perches, MD      acite back pain which is limiting movement and sleep. Toradol and depo medrol in office , steroids, muscle relaxant and tramadol. Xray of thoracic spine    Acute right flank pain   Last Assessment & Plan   05/03/2012 Office Visit Written 05/03/2012  1:04 PM by Kerri Perches, MD     CCUA in office absolutely negative for infection or blood    OA (osteoarthritis) of knee       Imaging: No results found.

## 2012-09-17 ENCOUNTER — Encounter: Payer: Self-pay | Admitting: Cardiology

## 2012-09-17 ENCOUNTER — Encounter: Payer: Self-pay | Admitting: Family Medicine

## 2012-09-23 ENCOUNTER — Encounter: Payer: Self-pay | Admitting: Family Medicine

## 2012-09-23 ENCOUNTER — Ambulatory Visit (INDEPENDENT_AMBULATORY_CARE_PROVIDER_SITE_OTHER): Payer: PRIVATE HEALTH INSURANCE | Admitting: Family Medicine

## 2012-09-23 VITALS — BP 160/100 | HR 79 | Resp 16 | Ht 61.5 in | Wt 204.0 lb

## 2012-09-23 DIAGNOSIS — I480 Paroxysmal atrial fibrillation: Secondary | ICD-10-CM

## 2012-09-23 DIAGNOSIS — I4891 Unspecified atrial fibrillation: Secondary | ICD-10-CM

## 2012-09-23 DIAGNOSIS — E669 Obesity, unspecified: Secondary | ICD-10-CM

## 2012-09-23 DIAGNOSIS — R5381 Other malaise: Secondary | ICD-10-CM

## 2012-09-23 DIAGNOSIS — Z7901 Long term (current) use of anticoagulants: Secondary | ICD-10-CM

## 2012-09-23 DIAGNOSIS — Z79899 Other long term (current) drug therapy: Secondary | ICD-10-CM

## 2012-09-23 DIAGNOSIS — E119 Type 2 diabetes mellitus without complications: Secondary | ICD-10-CM

## 2012-09-23 DIAGNOSIS — I10 Essential (primary) hypertension: Secondary | ICD-10-CM

## 2012-09-23 DIAGNOSIS — R5383 Other fatigue: Secondary | ICD-10-CM

## 2012-09-23 DIAGNOSIS — E785 Hyperlipidemia, unspecified: Secondary | ICD-10-CM

## 2012-09-23 MED ORDER — BENAZEPRIL HCL 20 MG PO TABS: 20.0000 mg | ORAL_TABLET | Freq: Every day | ORAL | Status: DC

## 2012-09-23 MED ORDER — DILTIAZEM HCL ER COATED BEADS 120 MG PO CP24: 120.0000 mg | ORAL_CAPSULE | Freq: Every day | ORAL | Status: DC

## 2012-09-23 NOTE — Progress Notes (Signed)
  Subjective:    Patient ID: Samantha Vega, female    DOB: 03/10/1951, 62 y.o.   MRN: [id number]  HPI  Pt in for hospital f/u from 1/26 to 07/14/2012 when she presented in a fib with RVR, now permanently anticoagulated, feels well, denies episodes of [palpitations or lightheadeness, denies hematuria or epistaxis or rectal bleeding Feels well and has no concerns or complaints except for increased nasal congedtion , clear drainage and sneezing with the advent of Spring, denies fever or chills Denies polyuria, polydipsia or blurred vision, denies hypoglycemic episodes  Review of Systems See HPI Denies recent fever or chills. Denies sinus pressure, nasal congestion, ear pain or sore throat. Denies chest congestion, productive cough or wheezing. Denies chest pains, palpitations and leg swelling Denies abdominal pain, nausea, vomiting,diarrhea or constipation.   Denies dysuria, frequency, hesitancy or incontinence. Denies joint pain, swelling and limitation in mobility. Denies headaches, seizures, numbness, or tingling. Denies depression, anxiety or insomnia. Denies skin break down or rash.        Objective:   Physical Exam  Patient alert and oriented and in no cardiopulmonary distress.  HEENT: No facial asymmetry, EOMI, no sinus tenderness,  oropharynx pink and moist.  Neck supple no adenopathy.  Chest: Clear to auscultation bilaterally.  CVS: S1, S2 no murmurs, no S3.  ABD: Soft non tender. Bowel sounds normal.  Ext: No edema  MS: Adequate ROM spine, shoulders, hips and knees.  Skin: Intact, no ulcerations or rash noted.  Psych: Good eye contact, normal affect. Memory intact not anxious or depressed appearing.  CNS: CN 2-12 intact, power, tone and sensation normal throughout.       Assessment & Plan:

## 2012-09-23 NOTE — Patient Instructions (Addendum)
F/u early July  Fasting lipid, cmp and EGFr, hBa1C, microalb , early July before visit  Blood pressure is high, fill cardizem and benazepril and start taking today as well please  It is important that you exercise regularly at least 30 minutes 5 times a week. If you develop chest pain, have severe difficulty breathing, or feel very tired, stop exercising immediately and seek medical attention   A healthy diet is rich in fruit, vegetables and whole grains. Poultry fish, nuts and beans are a healthy choice for protein rather then red meat. A low sodium diet and drinking 64 ounces of water daily is generally recommended. Oils and sweet should be limited. Carbohydrates especially for those who are diabetic or overweight, should be limited to 30-45 gram per meal. It is important to eat on a regular schedule, at least 3 times daily. Snacks should be primarily fruits, vegetables or nuts.

## 2012-09-25 NOTE — Assessment & Plan Note (Signed)
Controlled, no change in medication Patient advised to reduce carb and sweets, commit to regular physical activity, take meds as prescribed, test blood as directed, and attempt to lose weight, to improve blood sugar control.  

## 2012-09-25 NOTE — Assessment & Plan Note (Signed)
Denies hematuria, rectal blood , epistaxis or melena

## 2012-09-25 NOTE — Assessment & Plan Note (Signed)
Deteriorated. Patient re-educated about  the importance of commitment to a  minimum of 150 minutes of exercise per week. The importance of healthy food choices with portion control discussed. Encouraged to start a food diary, count calories and to consider  joining a support group. Sample diet sheets offered. Goals set by the patient for the next several months.    

## 2012-09-25 NOTE — Assessment & Plan Note (Signed)
Rate controlled , and regular

## 2012-09-25 NOTE — Assessment & Plan Note (Signed)
Uncontrolled, though discharged on cardizem pt not taking it, also script given for benazepril DASH diet and commitment to daily physical activity for a minimum of 30 minutes discussed and encouraged, as a part of hypertension management. The importance of attaining a healthy weight is also discussed.

## 2012-09-25 NOTE — Assessment & Plan Note (Signed)
Hyperlipidemia:Low fat diet discussed and encouraged.  updated lab due for next visit

## 2012-09-28 ENCOUNTER — Other Ambulatory Visit: Payer: Self-pay

## 2012-09-28 MED ORDER — METFORMIN HCL 500 MG PO TABS: ORAL_TABLET | ORAL | Status: DC

## 2012-09-28 MED ORDER — ROSUVASTATIN CALCIUM 20 MG PO TABS: ORAL_TABLET | ORAL | Status: DC

## 2012-10-29 ENCOUNTER — Telehealth: Payer: Self-pay | Admitting: *Deleted

## 2012-10-29 NOTE — Telephone Encounter (Signed)
Patient came by office stating the Carlena Hurl is over $293.80.  [facility] line [phone].  Spoke with Kelby Fam at [facility], patient has a deductible of 200.00, then a 70.00 co-pay.  Explained to patient regarding the deductible.  Patient requests being put on a different medication.  Is there another medication that she can take?  Please call patient.

## 2012-10-29 NOTE — Telephone Encounter (Signed)
Noted pt has been taking medication since prescribed by [facility] on 06-02-12 however pt switched from her husbands insurance thus the price went up, gave pt samples and rx card, pt will take discount card to pharmacy to see if this will help her out, pt to call back to advised, noted we may have to change medication or enroll her in the assistance program if applicable, pt understood

## 2012-12-01 NOTE — Telephone Encounter (Signed)
Pt noted that the discount card did help her out and she will not need to be set up for pt assistance program. however she does need a few samples until she can afford to get the medication refilled again, placed samples at front desk for pick up, pt understood

## 2012-12-01 NOTE — Telephone Encounter (Signed)
.  left message to have patient return my call with sister

## 2012-12-11 ENCOUNTER — Other Ambulatory Visit: Payer: Self-pay | Admitting: Family Medicine

## 2012-12-15 LAB — LIPID PANEL
Cholesterol: 229 mg/dL — ABNORMAL HIGH (ref 0–200)
VLDL: 22 mg/dL (ref 0–40)

## 2012-12-16 LAB — COMPLETE METABOLIC PANEL WITH GFR
AST: 16 U/L (ref 0–37)
Albumin: 4.1 g/dL (ref 3.5–5.2)
BUN: 20 mg/dL (ref 6–23)
CO2: 26 mEq/L (ref 19–32)
Calcium: 9.4 mg/dL (ref 8.4–10.5)
Chloride: 104 mEq/L (ref 96–112)
GFR, Est Non African American: 89 mL/min
Glucose, Bld: 97 mg/dL (ref 70–99)
Potassium: 4.4 mEq/L (ref 3.5–5.3)

## 2012-12-16 LAB — MICROALBUMIN / CREATININE URINE RATIO
Creatinine, Urine: 141.3 mg/dL
Microalb Creat Ratio: 3.5 mg/g (ref 0.0–30.0)

## 2012-12-22 ENCOUNTER — Ambulatory Visit (INDEPENDENT_AMBULATORY_CARE_PROVIDER_SITE_OTHER): Payer: BC Managed Care – PPO | Admitting: Family Medicine

## 2012-12-22 ENCOUNTER — Encounter: Payer: Self-pay | Admitting: Family Medicine

## 2012-12-22 VITALS — BP 122/86 | HR 72 | Resp 16 | Ht 61.5 in | Wt 197.0 lb

## 2012-12-22 DIAGNOSIS — I10 Essential (primary) hypertension: Secondary | ICD-10-CM

## 2012-12-22 DIAGNOSIS — E669 Obesity, unspecified: Secondary | ICD-10-CM

## 2012-12-22 DIAGNOSIS — M179 Osteoarthritis of knee, unspecified: Secondary | ICD-10-CM

## 2012-12-22 DIAGNOSIS — M25552 Pain in left hip: Secondary | ICD-10-CM | POA: Insufficient documentation

## 2012-12-22 DIAGNOSIS — M1710 Unilateral primary osteoarthritis, unspecified knee: Secondary | ICD-10-CM

## 2012-12-22 DIAGNOSIS — M25559 Pain in unspecified hip: Secondary | ICD-10-CM

## 2012-12-22 DIAGNOSIS — Z9181 History of falling: Secondary | ICD-10-CM

## 2012-12-22 DIAGNOSIS — E785 Hyperlipidemia, unspecified: Secondary | ICD-10-CM

## 2012-12-22 DIAGNOSIS — IMO0002 Reserved for concepts with insufficient information to code with codable children: Secondary | ICD-10-CM

## 2012-12-22 DIAGNOSIS — E119 Type 2 diabetes mellitus without complications: Secondary | ICD-10-CM

## 2012-12-22 MED ORDER — HYDROCODONE-ACETAMINOPHEN 5-325 MG PO TABS: ORAL_TABLET | ORAL | Status: DC

## 2012-12-22 MED ORDER — METFORMIN HCL 500 MG PO TABS: ORAL_TABLET | ORAL | Status: DC

## 2012-12-22 MED ORDER — ROSUVASTATIN CALCIUM 40 MG PO TABS: 40.0000 mg | ORAL_TABLET | Freq: Every day | ORAL | Status: DC

## 2012-12-22 NOTE — Patient Instructions (Addendum)
F/u with rectal in 4 month, call if you need me before  You are referred to Dr Romeo Apple for evaluation and treatment of knees and left hip  CONGRATS on markedly improved blood sugar and weight loss, keep it up  Cholesterol is still too high, cut back on cheese, fried and fatty foods. INCREASE crestor dose to 40mg  daily, OK to take TWO 20mg  daily till done  HBA1C, fasting lipid, cmp and EGFR in 4 month  You will get a script for a cane  NEW MED for pain vicodin , do not take with ultracet.

## 2012-12-22 NOTE — Progress Notes (Signed)
  Subjective:    Patient ID: Samantha Vega, female    DOB: 02/22/1951, 62 y.o.   MRN: [id number]  HPI  The PT is here for follow up and re-evaluation of chronic medical conditions, medication management and review of any available recent lab and radiology data.  Preventive health is updated, specifically  Cancer screening and Immunization.   Questions or concerns regarding consultations or procedures which the PT has had in the interim are  addressed. The PT denies any adverse reactions to current medications since the last visit.  Awful left hip and bilateral knee pain x 3 days, right knee worse, feels as though it will give out at times.     Review of Systems See HPI Denies recent fever or chills. Denies sinus pressure, nasal congestion, ear pain or sore throat. Denies chest congestion, productive cough or wheezing. Denies chest pains, palpitations and leg swelling Denies abdominal pain, nausea, vomiting,diarrhea or constipation.   Denies dysuria, frequency, hesitancy or incontinence. . Denies headaches, seizures, numbness, or tingling. Denies depression, anxiety or insomnia. Denies skin break down or rash.        Objective:   Physical Exam  Patient alert and oriented and in no cardiopulmonary distress.  HEENT: No facial asymmetry, EOMI, no sinus tenderness,  oropharynx pink and moist.  Neck supple no adenopathy.  Chest: Clear to auscultation bilaterally.  CVS: S1, S2 no murmurs, no S3.  ABD: Soft non tender. Bowel sounds normal.  Ext: No edema  MS: Adequate ROM spine, decreased ROM right hip and both knees , left worse than right.  Skin: Intact, no ulcerations or rash noted.  Psych: Good eye contact, normal affect. Memory intact not anxious or depressed appearing.  CNS: CN 2-12 intact, power, tone and sensation normal throughout.       Assessment & Plan:

## 2012-12-23 ENCOUNTER — Telehealth: Payer: Self-pay | Admitting: Family Medicine

## 2012-12-23 MED ORDER — GLUCOSE BLOOD VI STRP: ORAL_STRIP | Status: DC

## 2012-12-23 NOTE — Telephone Encounter (Signed)
Strips sent

## 2012-12-24 ENCOUNTER — Other Ambulatory Visit: Payer: Self-pay

## 2012-12-24 MED ORDER — GLUCOSE BLOOD VI STRP: ORAL_STRIP | Status: DC

## 2013-01-11 DIAGNOSIS — Z9181 History of falling: Secondary | ICD-10-CM | POA: Insufficient documentation

## 2013-01-11 NOTE — Assessment & Plan Note (Signed)
Progressive arthritis with instability of hip and knees, refer to ortho, cane script provided, as well as need to focus on safety stressed

## 2013-01-11 NOTE — Assessment & Plan Note (Signed)
Controlled, no change in medication DASH diet and commitment to daily physical activity for a minimum of 30 minutes discussed and encouraged, as a part of hypertension management. The importance of attaining a healthy weight is also discussed.  

## 2013-01-11 NOTE — Assessment & Plan Note (Signed)
Improving and well controlled, no med change Patient advised to reduce carb and sweets, commit to regular physical activity, take meds as prescribed, test blood as directed, and attempt to lose weight, to improve blood sugar control.

## 2013-01-11 NOTE — Assessment & Plan Note (Signed)
Improved. Pt applauded on succesful weight loss through lifestyle change, and encouraged to continue same. Weight loss goal set for the next several months.  

## 2013-01-11 NOTE — Assessment & Plan Note (Signed)
Uncontrolled Hyperlipidemia:Low fat diet discussed and encouraged.  Max dose of crestor

## 2013-01-11 NOTE — Assessment & Plan Note (Signed)
Increased and uncontrolled pain and instability ortho eval

## 2013-01-11 NOTE — Assessment & Plan Note (Signed)
Progressive pain and debility, ortho eval

## 2013-01-13 ENCOUNTER — Ambulatory Visit (INDEPENDENT_AMBULATORY_CARE_PROVIDER_SITE_OTHER): Payer: BC Managed Care – PPO | Admitting: Orthopedic Surgery

## 2013-01-13 ENCOUNTER — Other Ambulatory Visit: Payer: Self-pay | Admitting: *Deleted

## 2013-01-13 VITALS — BP 147/81 | Ht 62.5 in | Wt 198.0 lb

## 2013-01-13 DIAGNOSIS — M1710 Unilateral primary osteoarthritis, unspecified knee: Secondary | ICD-10-CM

## 2013-01-13 MED ORDER — DICLOFENAC SODIUM 1 % TD GEL: 4.0000 g | Freq: Four times a day (QID) | TRANSDERMAL | Status: DC

## 2013-01-13 NOTE — Progress Notes (Signed)
Patient ID: Samantha Vega, female   DOB: 1951-02-13, 62 y.o.   MRN: [id number] Chief Complaint  Patient presents with  . Knee Pain    Bilateral knee pain    62 year old female with history of osteoarthritis bilateral knees presents with anterior knee pain and pain with extension of her knee  Complains of a catching sensation with extension  Physical exam reveals tenderness over both patellar tendons painful extension crepitance on range of motion he is remain stable McMurray's measuring negative is mild medial joint line tenderness consistent with varus osteoarthritis  Distal neurovascular function intact  Impression patellar tendinitis osteoarthritis both knees  Recommend potential for grams 4 times a day both knees. The patient is on anticoagulation  Also recommend 5 mg 12 day Sterapred Dosepak and she is advised to check glucose daily secondary to her diabetes  Followup in 6 weeks

## 2013-01-13 NOTE — Patient Instructions (Addendum)
PICK UP PRESCRIPTION AT PHARMACY

## 2013-01-22 ENCOUNTER — Other Ambulatory Visit: Payer: Self-pay | Admitting: Family Medicine

## 2013-01-31 ENCOUNTER — Other Ambulatory Visit: Payer: Self-pay | Admitting: Cardiology

## 2013-01-31 MED ORDER — RIVAROXABAN 20 MG PO TABS: 20.0000 mg | ORAL_TABLET | Freq: Every day | ORAL | Status: DC

## 2013-02-10 ENCOUNTER — Other Ambulatory Visit: Payer: Self-pay

## 2013-02-10 ENCOUNTER — Other Ambulatory Visit: Payer: Self-pay | Admitting: Family Medicine

## 2013-02-10 DIAGNOSIS — M1710 Unilateral primary osteoarthritis, unspecified knee: Secondary | ICD-10-CM

## 2013-02-10 DIAGNOSIS — M179 Osteoarthritis of knee, unspecified: Secondary | ICD-10-CM

## 2013-02-10 MED ORDER — HYDROCODONE-ACETAMINOPHEN 5-325 MG PO TABS: ORAL_TABLET | ORAL | Status: DC

## 2013-02-24 ENCOUNTER — Encounter: Payer: Self-pay | Admitting: Orthopedic Surgery

## 2013-02-24 ENCOUNTER — Ambulatory Visit (INDEPENDENT_AMBULATORY_CARE_PROVIDER_SITE_OTHER): Payer: BC Managed Care – PPO | Admitting: Orthopedic Surgery

## 2013-02-24 VITALS — BP 125/63 | Ht 62.5 in | Wt 198.0 lb

## 2013-02-24 DIAGNOSIS — IMO0002 Reserved for concepts with insufficient information to code with codable children: Secondary | ICD-10-CM

## 2013-02-24 DIAGNOSIS — M179 Osteoarthritis of knee, unspecified: Secondary | ICD-10-CM

## 2013-02-24 DIAGNOSIS — M1710 Unilateral primary osteoarthritis, unspecified knee: Secondary | ICD-10-CM

## 2013-02-24 NOTE — Progress Notes (Signed)
Subjective:     Patient ID: Samantha Vega, female   DOB: 02/16/1951, 62 y.o.   MRN: [id number]  HPI Chief Complaint  Patient presents with  . Follow-up    6 week recheck Bilateral Knees     She was treated with steroids and topical arthritis cream and reports complete resolution of symptoms  Review of Systems     Objective:   Physical Exam She has full range of motion in both knees with no tenderness    Assessment:     Inflammation of joint arthritis    Plan:     Normal activities follow up as needed

## 2013-02-24 NOTE — Patient Instructions (Signed)
Activity as tolerated

## 2013-02-25 ENCOUNTER — Telehealth: Payer: Self-pay | Admitting: *Deleted

## 2013-02-25 NOTE — Telephone Encounter (Signed)
Pt is on Xarelto not coumadin.  Informed she can hold Xarelto 1 night before dental procedure and restart night of procedure if OK with dentist.

## 2013-02-25 NOTE — Telephone Encounter (Signed)
PT IS SCHEDULED 9/16 TO HAVE 8-9 TEETH PULLED. WHEN SHOULD SHE STOP COUMADIN?

## 2013-03-07 ENCOUNTER — Ambulatory Visit (HOSPITAL_COMMUNITY)
Admission: RE | Admit: 2013-03-07 | Discharge: 2013-03-07 | Disposition: A | Discharge status: Home / Self Care | Payer: BC Managed Care – PPO | Source: Ambulatory Visit | Attending: Cardiology | Admitting: Cardiology

## 2013-03-07 ENCOUNTER — Encounter: Payer: Self-pay | Admitting: Cardiology

## 2013-03-07 ENCOUNTER — Ambulatory Visit (INDEPENDENT_AMBULATORY_CARE_PROVIDER_SITE_OTHER): Payer: BC Managed Care – PPO | Admitting: Cardiology

## 2013-03-07 VITALS — BP 121/76 | HR 80 | Ht 62.0 in | Wt 199.0 lb

## 2013-03-07 DIAGNOSIS — E041 Nontoxic single thyroid nodule: Secondary | ICD-10-CM

## 2013-03-07 DIAGNOSIS — I10 Essential (primary) hypertension: Secondary | ICD-10-CM

## 2013-03-07 DIAGNOSIS — I480 Paroxysmal atrial fibrillation: Secondary | ICD-10-CM

## 2013-03-07 DIAGNOSIS — E042 Nontoxic multinodular goiter: Secondary | ICD-10-CM | POA: Insufficient documentation

## 2013-03-07 DIAGNOSIS — I4891 Unspecified atrial fibrillation: Secondary | ICD-10-CM

## 2013-03-07 NOTE — Progress Notes (Addendum)
Clinical Summary Samantha Vega is a 62 y.o.female  1. Paroxysmal afib:  - rate control strategy w/ diltiazem and metoprolol - on xarelto,denies any issues w/ bleeding  - reports she feels her heart beat on rare occasion, lasts just a few minutes. Otherwise no significant symptoms  2. HTN - does not check at home.  -compliant w/ meds  3. HL - compliant w/ crestor, recent increase to 40mg  in July 2014 - 12/2012: TC 229 TG 109 HDL 38 LDL 169      Past Medical History  Diagnosis Date  . Right bundle Samantha Vega block     +Palpitations.   EKG 11/2006:  NSR, RBBB, LAFB, markedly delayed R-wave progression, no change; Echocardiogram in 10/2006-suboptimal quality, normal; Event recorder-PVCs, no significant arrhythmias or symptoms ;  . Obesity   . Hyperlipidemia   . Hypertension   . Diabetes mellitus   . Chest pain     Noncardiac; evaluated and treated in 2008  . Enlarged heart   Atrial fibrillation   No Known Allergies   Current Outpatient Prescriptions  Medication Sig Dispense Refill  . ACCU-CHEK AVIVA PLUS test strip USE TO TEST BLOOD SUGAR EVERY DAY  50 each  0  . aspirin EC 81 MG tablet Take 81 mg by mouth daily.      . benazepril (LOTENSIN) 20 MG tablet Take 1 tablet (20 mg total) by mouth daily.  30 tablet  11  . calcium-vitamin D (OSCAL 500/200 D-3) 500 MG tablet Take 1 tablet by mouth 2 (two) times daily.       . diclofenac sodium (VOLTAREN) 1 % GEL Apply 4 g topically 4 (four) times daily.  5 Tube  3  . diltiazem (CARDIZEM CD) 120 MG 24 hr capsule Take 1 capsule (120 mg total) by mouth daily.  30 capsule  11  . HYDROcodone-acetaminophen (NORCO) 5-325 MG per tablet One tablet twice daily, as needed, for uncontrolled hip or knee paiin  60 tablet  0  . metFORMIN (GLUCOPHAGE) 500 MG tablet TAKE 1 TABLET BY MOUTH 2 TIMES DAILY WITH A MEAL.  180 tablet  1  . metoprolol (LOPRESSOR) 100 MG tablet Take 1 tablet (100 mg total) by mouth 2 (two) times daily.  180 tablet  3  . Multiple  Vitamins-Minerals ([employer] SPECTRAVITE PO) Take 1 tablet by mouth daily.      . Rivaroxaban (XARELTO) 20 MG TABS tablet Take 1 tablet (20 mg total) by mouth daily.  30 tablet  3  . rosuvastatin (CRESTOR) 40 MG tablet Take 1 tablet (40 mg total) by mouth daily.  30 tablet  4   No current facility-administered medications for this visit.     Past Surgical History  Procedure Laterality Date  . Total abdominal hysterectomy w/ bilateral salpingoophorectomy    . Shoulder surgery  2006    Left-post-trauma     No Known Allergies    Family History  Problem Relation Age of Onset  . Hypertension Mother     and sister  . Arthritis Mother   . Stroke Father   . Hypertension Father   . Diabetes Sister     x1  . Sarcoidosis Sister     x1  . Heart failure Brother   . Coronary artery disease Father      Social History Samantha Vega reports that she has never smoked. She has never used smokeless tobacco. Samantha Vega reports that she does not drink alcohol.   Review of Systems 12 point ROS negative  other than reported in HPI  Physical Examination p 80 bp 121/76 Gen: resting comfortably, NAD HEENT: no scleral icterus, pupils equal round and reactive, no palptable cervical adenopathy CV: RRR, no m/r/g, no jvd, no carotid bruits Pulm: CTAB Abd: soft, NT, ND NABS, no hepatosplenomegaly Ext: warm, no edema.  Skin: warm, no rash Neuro: A&Ox3, no focal deficits    Diagnostic Studies 07/12/12 Echo: LVEF 55-60%, mild basal septal hypertrophy,  07/11/12 Carotid US: <50% bilateral stenosis, 3.4 cm thyroid nodule 03/07/13 EKG: SR, RBBB, LAFB, LAE, non-spec ST/T changes  Assessment and Plan  1. Parox afib - rare symptoms, will continue current rate control strategy. Continue xarelto, her CHADS2 score is 2  2. HTN: at goal, which given her DM is <130/80. She is on an ACE-I in the settiing of her DM, continue current meds - counseled on Na restriction, DASH diet, weight loss, exercise.  3.  HL: above goal, given her DM goal LDL< 70. Her crestor was increased to 40 mg by her pcp in 12/2012, follow the results. Counseled on dietary changes and exercise.   4. Thyroid nodule: incidental findings on prior carotid US. Will check thyroid tests, check thyroid US. Pending this initial workup will need to have further w/u by her PCP or endocrinologist.   Antoine Poche, M.D., F.A.C.C.

## 2013-03-07 NOTE — Patient Instructions (Addendum)
Your physician recommends that you schedule a follow-up appointment in: 6 months You will receive a reminder letter two months in advance reminding you to call and schedule your appointment. If you don't receive this letter, please contact our office.  Your physician recommends that you continue on your current medications as directed. Please refer to the Current Medication list given to you today.  Your physician recommends you have an ultra sound to rule out any complications  Your physician recommends that you return for lab work today. (SLIPS GIVEN FOR TSH REVIEW)

## 2013-03-08 ENCOUNTER — Telehealth: Payer: Self-pay | Admitting: *Deleted

## 2013-03-08 NOTE — Telephone Encounter (Signed)
Please advise 

## 2013-03-08 NOTE — Telephone Encounter (Signed)
PT was told that she would get a call back yesterday about results.

## 2013-03-09 ENCOUNTER — Other Ambulatory Visit: Payer: Self-pay | Admitting: Family Medicine

## 2013-03-09 DIAGNOSIS — E079 Disorder of thyroid, unspecified: Secondary | ICD-10-CM

## 2013-03-09 DIAGNOSIS — E042 Nontoxic multinodular goiter: Secondary | ICD-10-CM

## 2013-03-16 ENCOUNTER — Telehealth: Payer: Self-pay | Admitting: Cardiovascular Disease

## 2013-03-16 NOTE — Telephone Encounter (Signed)
Samples ready to pick up at front desk.

## 2013-03-16 NOTE — Telephone Encounter (Signed)
Patient needs samples for Xarelto 20mg  / tgs

## 2013-03-17 ENCOUNTER — Ambulatory Visit (INDEPENDENT_AMBULATORY_CARE_PROVIDER_SITE_OTHER): Payer: BC Managed Care – PPO | Admitting: Otolaryngology

## 2013-03-17 DIAGNOSIS — R1312 Dysphagia, oropharyngeal phase: Secondary | ICD-10-CM

## 2013-03-17 DIAGNOSIS — D449 Neoplasm of uncertain behavior of unspecified endocrine gland: Secondary | ICD-10-CM

## 2013-03-18 ENCOUNTER — Other Ambulatory Visit (INDEPENDENT_AMBULATORY_CARE_PROVIDER_SITE_OTHER): Payer: Self-pay | Admitting: Otolaryngology

## 2013-03-18 DIAGNOSIS — IMO0002 Reserved for concepts with insufficient information to code with codable children: Secondary | ICD-10-CM

## 2013-03-22 ENCOUNTER — Ambulatory Visit (HOSPITAL_COMMUNITY): Payer: BC Managed Care – PPO

## 2013-03-29 ENCOUNTER — Ambulatory Visit (HOSPITAL_COMMUNITY)
Admission: RE | Admit: 2013-03-29 | Discharge: 2013-03-29 | Disposition: A | Discharge status: Home / Self Care | Payer: BC Managed Care – PPO | Source: Ambulatory Visit | Attending: Otolaryngology | Admitting: Otolaryngology

## 2013-03-29 ENCOUNTER — Other Ambulatory Visit (HOSPITAL_COMMUNITY): Payer: Self-pay | Admitting: Otolaryngology

## 2013-03-29 DIAGNOSIS — IMO0002 Reserved for concepts with insufficient information to code with codable children: Secondary | ICD-10-CM

## 2013-03-29 DIAGNOSIS — E042 Nontoxic multinodular goiter: Secondary | ICD-10-CM | POA: Insufficient documentation

## 2013-03-29 MED ORDER — LIDOCAINE HCL (PF) 2 % IJ SOLN
10.0000 mL | Freq: Once | INTRAMUSCULAR | Status: AC
  Administered 2013-03-29: 10 mL

## 2013-03-29 MED ORDER — LIDOCAINE HCL (PF) 2 % IJ SOLN
INTRAMUSCULAR | Status: AC
  Administered 2013-03-29: 10 mL
  Filled 2013-03-29: qty 10

## 2013-03-29 NOTE — Procedures (Signed)
PreOperative Dx: Multiple thyroid nodules Postoperative Dx: Multiple thyroid nodules Procedure:   US guided FNA of 3 thyroid nodule Radiologist:  Tyron Russell Anesthesia:  5 ml of 2% lidocaine Specimen:  FNA x 3 of each of 3 nodules:  Superior isthmic, inferior isthmic, and inferior RT lobe.  EBL:   < 1 ml Complications: None

## 2013-03-30 IMAGING — CR DG CHEST 1V
1 series · 1 of 1 positions shown · non-contrast
Comparison: Plain film chest 01/11/2004 and CT chest 01/12/2004.

CLINICAL DATA: Shortness of breath.

CHEST - 1 VIEW

[view not recorded]
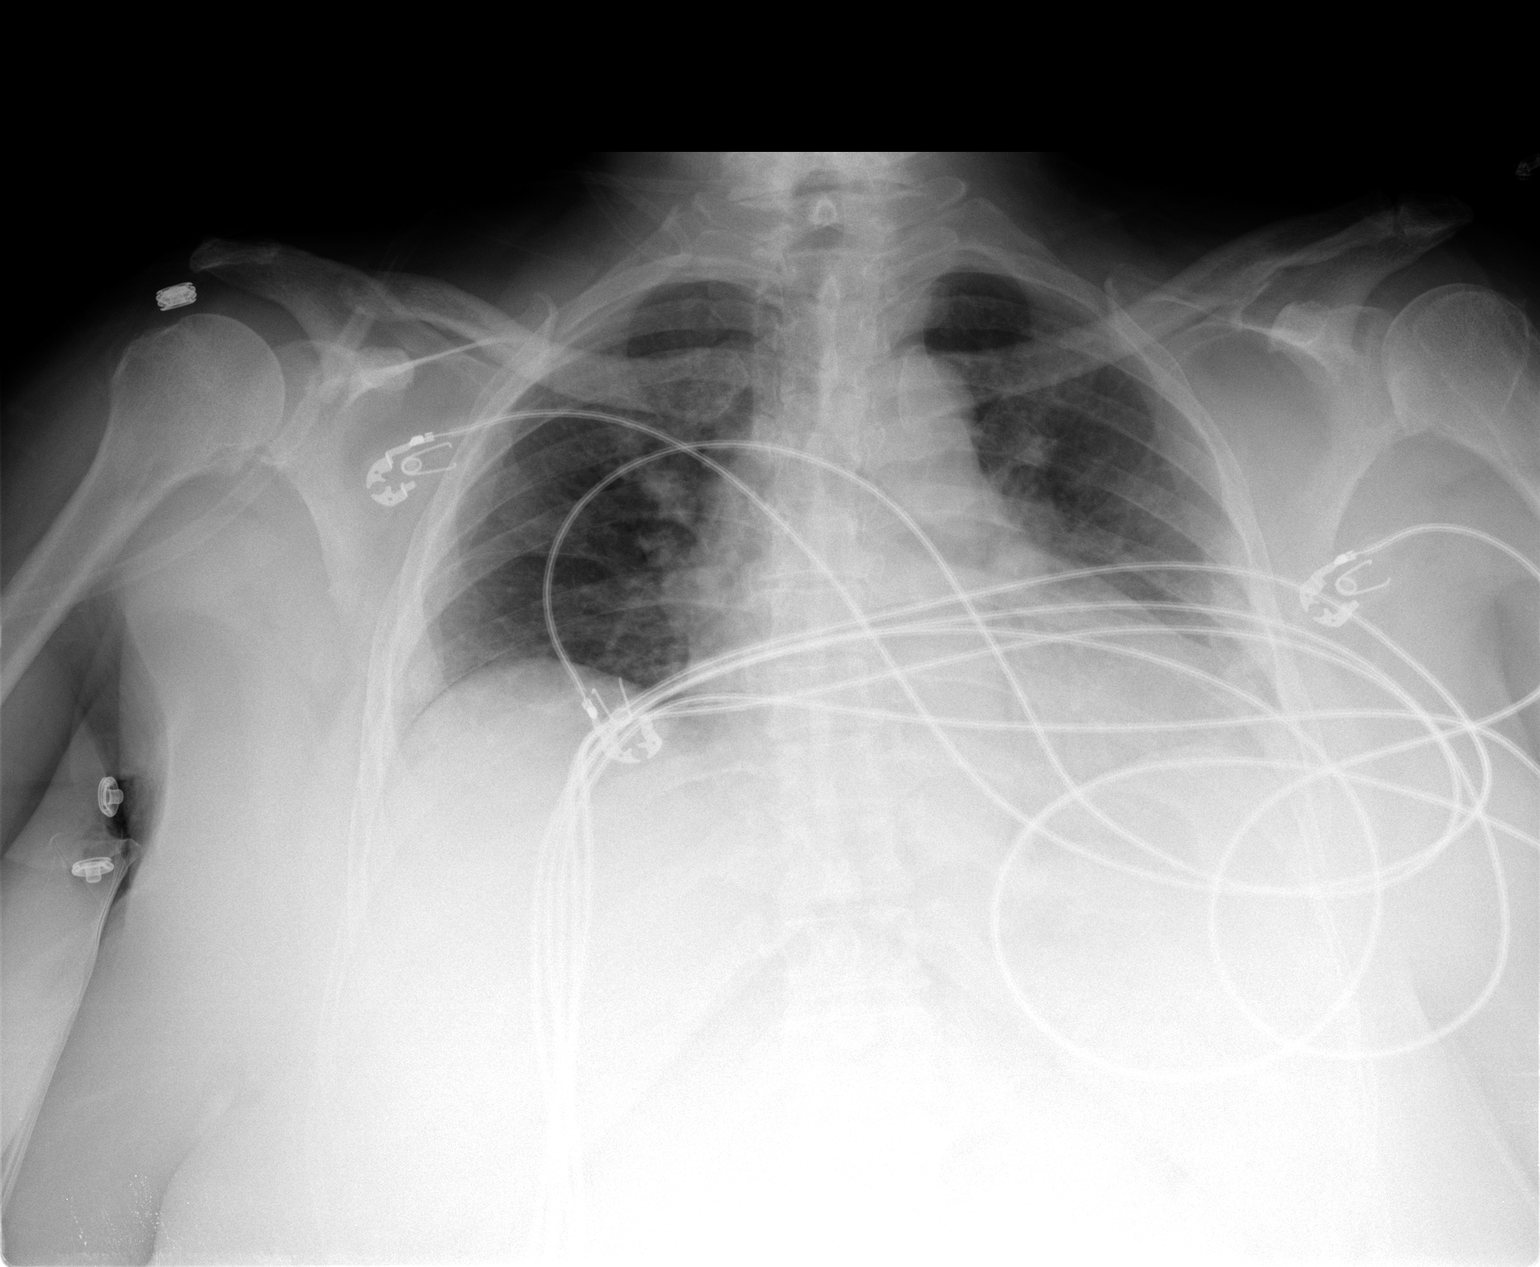

[1 of 1 positions shown; findings below may reference images not displayed]

FINDINGS: Lung volumes are low but the lungs are clear.  Heart size
upper normal.  No pneumothorax or effusion.  Postoperative change
of resection of the right AC joint noted.
IMPRESSION: Low volume chest without acute abnormality.

## 2013-04-19 ENCOUNTER — Telehealth: Payer: Self-pay | Admitting: Family Medicine

## 2013-04-19 ENCOUNTER — Other Ambulatory Visit: Payer: Self-pay

## 2013-04-19 DIAGNOSIS — M1710 Unilateral primary osteoarthritis, unspecified knee: Secondary | ICD-10-CM

## 2013-04-19 DIAGNOSIS — M179 Osteoarthritis of knee, unspecified: Secondary | ICD-10-CM

## 2013-04-19 LAB — COMPLETE METABOLIC PANEL WITH GFR
ALT: 19 U/L (ref 0–35)
AST: 18 U/L (ref 0–37)
Alkaline Phosphatase: 106 U/L (ref 39–117)
CO2: 30 mEq/L (ref 19–32)
Sodium: 141 mEq/L (ref 135–145)
Total Bilirubin: 0.3 mg/dL (ref 0.3–1.2)
Total Protein: 7.3 g/dL (ref 6.0–8.3)

## 2013-04-19 LAB — LIPID PANEL
Cholesterol: 130 mg/dL (ref 0–200)
LDL Cholesterol: 77 mg/dL (ref 0–99)
VLDL: 17 mg/dL (ref 0–40)

## 2013-04-19 LAB — HEMOGLOBIN A1C: Mean Plasma Glucose: 137 mg/dL — ABNORMAL HIGH (ref <117)

## 2013-04-19 MED ORDER — HYDROCODONE-ACETAMINOPHEN 5-325 MG PO TABS: ORAL_TABLET | ORAL | Status: DC

## 2013-04-19 MED ORDER — TRAMADOL HCL 50 MG PO TABS: ORAL_TABLET | ORAL | Status: DC

## 2013-04-19 NOTE — Telephone Encounter (Signed)
Pt came in requesting refill on hydrocodone , which she uses intermittently for chest wall pain, right anterior, following an MVa, discussed changing to tramadol, and shje i9s agreeable, same sent to her pharmacy and she is aware

## 2013-04-25 ENCOUNTER — Encounter (INDEPENDENT_AMBULATORY_CARE_PROVIDER_SITE_OTHER): Payer: Self-pay

## 2013-04-25 ENCOUNTER — Encounter: Payer: Self-pay | Admitting: Family Medicine

## 2013-04-25 ENCOUNTER — Ambulatory Visit (INDEPENDENT_AMBULATORY_CARE_PROVIDER_SITE_OTHER): Payer: BC Managed Care – PPO | Admitting: Family Medicine

## 2013-04-25 VITALS — BP 124/84 | HR 73 | Resp 16 | Ht 61.5 in | Wt 200.0 lb

## 2013-04-25 DIAGNOSIS — Z23 Encounter for immunization: Secondary | ICD-10-CM

## 2013-04-25 DIAGNOSIS — I480 Paroxysmal atrial fibrillation: Secondary | ICD-10-CM

## 2013-04-25 DIAGNOSIS — E119 Type 2 diabetes mellitus without complications: Secondary | ICD-10-CM

## 2013-04-25 DIAGNOSIS — M899 Disorder of bone, unspecified: Secondary | ICD-10-CM

## 2013-04-25 DIAGNOSIS — I10 Essential (primary) hypertension: Secondary | ICD-10-CM

## 2013-04-25 DIAGNOSIS — Z1211 Encounter for screening for malignant neoplasm of colon: Secondary | ICD-10-CM

## 2013-04-25 DIAGNOSIS — E785 Hyperlipidemia, unspecified: Secondary | ICD-10-CM

## 2013-04-25 DIAGNOSIS — I4891 Unspecified atrial fibrillation: Secondary | ICD-10-CM

## 2013-04-25 DIAGNOSIS — E669 Obesity, unspecified: Secondary | ICD-10-CM

## 2013-04-25 LAB — POC HEMOCCULT BLD/STL (OFFICE/1-CARD/DIAGNOSTIC)

## 2013-04-25 MED ORDER — METFORMIN HCL 500 MG PO TABS: ORAL_TABLET | ORAL | Status: DC

## 2013-04-25 NOTE — Progress Notes (Signed)
  Subjective:    Patient ID: Samantha Vega, female    DOB: 08-16-1950, 62 y.o.   MRN: [id number]  HPI The PT is here for follow up and re-evaluation of chronic medical conditions, medication management and review of any available recent lab and radiology data.  Preventive health is updated, specifically  Cancer screening and Immunization.   Questions or concerns regarding consultations or procedures which the PT has had in the interim are  addressed. The PT denies any adverse reactions to current medications since the last visit.  There are no new concerns.  There are no specific complaints       Review of Systems See HPI Denies recent fever or chills. Denies sinus pressure, nasal congestion, ear pain or sore throat. Denies chest congestion, productive cough or wheezing. Denies chest pains, palpitations and leg swelling Denies abdominal pain, nausea, vomiting,diarrhea or constipation.   Denies dysuria, frequency, hesitancy or incontinence. Denies joint pain, swelling and limitation in mobility. Denies headaches, seizures, numbness, or tingling. Denies depression, anxiety or insomnia. Denies skin break down or rash.        Objective:   Physical Exam Patient alert and oriented and in no cardiopulmonary distress.  HEENT: No facial asymmetry, EOMI, no sinus tenderness,  oropharynx pink and moist.  Neck supple no adenopathy.  Chest: Clear to auscultation bilaterally.  CVS: S1, S2 no murmurs, no S3.  ABD: Soft non tender. Bowel sounds normal.  Ext: No edema  MS: Adequate ROM spine, shoulders, hips and knees.  Skin: Intact, no ulcerations or rash noted.  Psych: Good eye contact, normal affect. Memory intact not anxious or depressed appearing.  CNS: CN 2-12 intact, power, tone and sensation normal throughout.        Assessment & Plan:

## 2013-04-25 NOTE — Patient Instructions (Addendum)
F/u in 4 month, call if you need me before please.  No changes in medication  Rectal and flu vaccine today   It is important that you exercise regularly at least 30 minutes 5 times a week. If you develop chest pain, have severe difficulty breathing, or feel very tired, stop exercising immediately and seek medical attention    Please work on weight loss for improved health  Mammogram due by next March, please schedule and keep appt  hBA1C ,. Chem 7 and eGFr, vit D and CBC nonn fasting in 4 month, before visit.  Blood sugar, blood pressure and cholesterol are excellent

## 2013-05-05 ENCOUNTER — Encounter: Payer: Self-pay | Admitting: Orthopedic Surgery

## 2013-05-05 ENCOUNTER — Ambulatory Visit: Payer: PRIVATE HEALTH INSURANCE | Admitting: Orthopedic Surgery

## 2013-05-16 NOTE — Assessment & Plan Note (Signed)
Controlled, no change in medication DASH diet and commitment to daily physical activity for a minimum of 30 minutes discussed and encouraged, as a part of hypertension management. The importance of attaining a healthy weight is also discussed.  

## 2013-05-17 NOTE — Assessment & Plan Note (Signed)
Controlled, no change in medication Patient advised to reduce carb and sweets, commit to regular physical activity, take meds as prescribed, test blood as directed, and attempt to lose weight, to improve blood sugar control.  

## 2013-05-17 NOTE — Assessment & Plan Note (Signed)
Hyperlipidemia:Low fat diet discussed and encouraged.  Controlled, no change in medication Low HDL, needs to exercise regularly

## 2013-05-17 NOTE — Assessment & Plan Note (Addendum)
Deteriorated. Patient re-educated about  the importance of commitment to a  minimum of 150 minutes of exercise per week. The importance of healthy food choices with portion control discussed. Encouraged to start a food diary, count calories and to consider  joining a support group. Sample diet sheets offered. Goals set by the patient for the next several months.    

## 2013-05-17 NOTE — Assessment & Plan Note (Signed)
Regular heart rate and denies palpitations

## 2013-05-19 ENCOUNTER — Encounter (HOSPITAL_COMMUNITY): Payer: Self-pay | Admitting: Emergency Medicine

## 2013-05-19 ENCOUNTER — Emergency Department (HOSPITAL_COMMUNITY): Payer: BC Managed Care – PPO

## 2013-05-19 ENCOUNTER — Emergency Department (HOSPITAL_COMMUNITY)
Admission: EM | Admit: 2013-05-19 | Discharge: 2013-05-20 | Disposition: A | Discharge status: Home / Self Care | Payer: BC Managed Care – PPO | Attending: Emergency Medicine | Admitting: Emergency Medicine

## 2013-05-19 DIAGNOSIS — I10 Essential (primary) hypertension: Secondary | ICD-10-CM | POA: Insufficient documentation

## 2013-05-19 DIAGNOSIS — Z791 Long term (current) use of non-steroidal anti-inflammatories (NSAID): Secondary | ICD-10-CM | POA: Insufficient documentation

## 2013-05-19 DIAGNOSIS — Z7982 Long term (current) use of aspirin: Secondary | ICD-10-CM | POA: Insufficient documentation

## 2013-05-19 DIAGNOSIS — Z79899 Other long term (current) drug therapy: Secondary | ICD-10-CM | POA: Insufficient documentation

## 2013-05-19 DIAGNOSIS — E785 Hyperlipidemia, unspecified: Secondary | ICD-10-CM | POA: Insufficient documentation

## 2013-05-19 DIAGNOSIS — E669 Obesity, unspecified: Secondary | ICD-10-CM | POA: Insufficient documentation

## 2013-05-19 DIAGNOSIS — M25521 Pain in right elbow: Secondary | ICD-10-CM

## 2013-05-19 DIAGNOSIS — E119 Type 2 diabetes mellitus without complications: Secondary | ICD-10-CM | POA: Insufficient documentation

## 2013-05-19 DIAGNOSIS — M129 Arthropathy, unspecified: Secondary | ICD-10-CM | POA: Insufficient documentation

## 2013-05-19 DIAGNOSIS — M25529 Pain in unspecified elbow: Secondary | ICD-10-CM | POA: Insufficient documentation

## 2013-05-19 HISTORY — DX: Unspecified osteoarthritis, unspecified site: M19.90

## 2013-05-19 MED ORDER — OXYCODONE-ACETAMINOPHEN 5-325 MG PO TABS
1.0000 | ORAL_TABLET | Freq: Once | ORAL | Status: AC
  Administered 2013-05-20: 1 via ORAL
  Filled 2013-05-19: qty 1

## 2013-05-19 NOTE — ED Notes (Signed)
Pain rt elbow, onset 1-2 days ago.  Pain with motion. No known injury.

## 2013-05-19 NOTE — ED Provider Notes (Signed)
CSN: [id number]     Arrival date & time 05/19/13  2222 History  This chart was scribed for Samantha Gaskins, MD by Bennett Scrape, ED Scribe. This patient was seen in room APA12/APA12 and the patient's care was started at 11:36 PM.   Chief Complaint  Patient presents with  . Elbow Pain    Patient is a 62 y.o. female presenting with general illness. The history is provided by the patient. No language interpreter was used.  Illness Location:  Right elbow Severity:  Severe Onset quality:  Gradual Duration:  2 days Timing:  Constant Progression:  Worsening Chronicity:  New Associated symptoms: no chest pain and no shortness of breath     HPI Comments: Samantha Vega is a 62 y.o. female who presents to the Emergency Department complaining of 2 days of right elbow pain. She denies any overuse or repetitive motions of the elbow. She denies any falls or injuries. She denies any weakness or numbness in her fingers or arm. She denies pain in any of her other joints.  She denies taking any OTC medications to improve her symptoms. She denies having a h/o gout or arthritis.   Past Medical History  Diagnosis Date  . Right bundle branch block     +Palpitations.   EKG 11/2006:  NSR, RBBB, LAFB, markedly delayed R-wave progression, no change; Echocardiogram in 10/2006-suboptimal quality, normal; Event recorder-PVCs, no significant arrhythmias or symptoms ;  . Obesity   . Hyperlipidemia   . Hypertension   . Diabetes mellitus   . Chest pain     Noncardiac; evaluated and treated in 2008  . Enlarged heart   . Arthritis    Past Surgical History  Procedure Laterality Date  . Total abdominal hysterectomy w/ bilateral salpingoophorectomy    . Shoulder surgery  2006    Left-post-trauma  . Abdominal hysterectomy     Family History  Problem Relation Age of Onset  . Hypertension Mother     and sister  . Arthritis Mother   . Stroke Father   . Hypertension Father   . Diabetes Sister     x1  .  Sarcoidosis Sister     x1  . Heart failure Brother   . Coronary artery disease Father    History  Substance Use Topics  . Smoking status: Never Smoker   . Smokeless tobacco: Never Used  . Alcohol Use: No   No OB history provided.  Review of Systems  Respiratory: Negative for shortness of breath.   Cardiovascular: Negative for chest pain.  Musculoskeletal: Positive for arthralgias. Negative for joint swelling.  Neurological: Negative for weakness and numbness.    Allergies  Review of patient's allergies indicates no known allergies.  Home Medications   Current Outpatient Rx  Name  Route  Sig  Dispense  Refill  . aspirin EC 81 MG tablet   Oral   Take 81 mg by mouth daily.         . benazepril (LOTENSIN) 20 MG tablet   Oral   Take 1 tablet (20 mg total) by mouth daily.   30 tablet   11   . calcium-vitamin D (OSCAL 500/200 D-3) 500 MG tablet   Oral   Take 1 tablet by mouth 2 (two) times daily.          . diclofenac sodium (VOLTAREN) 1 % GEL   Topical   Apply 4 g topically 4 (four) times daily.   5 Tube   3   .  diltiazem (CARDIZEM CD) 120 MG 24 hr capsule   Oral   Take 1 capsule (120 mg total) by mouth daily.   30 capsule   11   . glucose blood test strip   Other   1 each by Other route as needed for other (one touch ultra strips). Once daily testing         . metFORMIN (GLUCOPHAGE) 500 MG tablet      TAKE 1 TABLET BY MOUTH 2 TIMES DAILY WITH A MEAL.   180 tablet   1   . metoprolol (LOPRESSOR) 100 MG tablet   Oral   Take 1 tablet (100 mg total) by mouth 2 (two) times daily.   180 tablet   3   . Multiple Vitamins-Minerals ([employer] SPECTRAVITE PO)   Oral   Take 1 tablet by mouth daily.         . Rivaroxaban (XARELTO) 20 MG TABS tablet   Oral   Take 1 tablet (20 mg total) by mouth daily.   30 tablet   3   . rosuvastatin (CRESTOR) 40 MG tablet   Oral   Take 1 tablet (40 mg total) by mouth daily.   30 tablet   4     Dose increase  effective 12/22/2012   . traMADol (ULTRAM) 50 MG tablet      One tablet once daily, as needed, for uncontrolled right chest wall pain   30 tablet   1     Discontinue hydrocodone, effective 04/19/2013    Triage Vitals: BP 142/86  Pulse 74  Temp(Src) 98.2 F (36.8 C) (Oral)  Resp 20  Wt 200 lb (90.719 kg)  SpO2 98%  Physical Exam  Nursing note and vitals reviewed.  CONSTITUTIONAL: Well developed/well nourished HEAD: Normocephalic/atraumatic EYES: EOMI/PERRL ENMT: Mucous membranes moist NECK: supple no meningeal signs SPINE:entire spine nontender CV: S1/S2 noted LUNGS: Lungs are clear to auscultation bilaterally, no apparent distress ABDOMEN: soft, nontender, no rebound or guarding NEURO: Pt is awake/alert, moves all extremitiesx4 EXTREMITIES: pulses normal, full ROM, tenderness with ROM of the right elbow but no edema, effusion or warmth noted. Distal pulses are equal and intact. There is no wrist/hand tenderness noted SKIN: warm, color normal PSYCH: no abnormalities of mood noted   ED Course  Procedures (including critical care time)  Medications  oxyCODONE-acetaminophen (PERCOCET/ROXICET) 5-325 MG per tablet 1 tablet (not administered)    DIAGNOSTIC STUDIES: Oxygen Saturation is 98% on room air, normal by my interpretation.    COORDINATION OF CARE: 11:38 PM-Discussed treatment plan which includes pain medications and x-ray of right elbow with pt at bedside and pt agreed to plan.    Will place patient in sling, and advised f/u with ortho No signs of septic joint, I doubt occult fracture Her pain is localized to right olecranon and no crepitance/warmth noted  At discharge, family reports they think she may be in afib, as they report "she looks like she is having problems"  Pt denies cp/sob.  There is no tachycardia noted on my evaluation and her heart sounds are regular.  Labs Review Labs Reviewed - No data to display Imaging Review No results found.  EKG  Interpretation   None       MDM  No diagnosis found. Nursing notes including past medical history and social history reviewed and considered in documentation xrays reviewed and considered   I personally performed the services described in this documentation, which was scribed in my presence. The recorded information has been  reviewed and is accurate.      Samantha Gaskins, MD 05/20/13 [phone]

## 2013-05-20 MED ORDER — OXYCODONE-ACETAMINOPHEN 5-325 MG PO TABS
1.0000 | ORAL_TABLET | Freq: Once | ORAL | Status: AC
  Administered 2013-05-20: 1 via ORAL

## 2013-05-20 MED ORDER — OXYCODONE-ACETAMINOPHEN 5-325 MG PO TABS: 1.0000 | ORAL_TABLET | ORAL | Status: DC | PRN

## 2013-05-20 MED ORDER — OXYCODONE-ACETAMINOPHEN 5-325 MG PO TABS
1.0000 | ORAL_TABLET | Freq: Once | ORAL | Status: AC
  Filled 2013-05-20: qty 1

## 2013-05-26 ENCOUNTER — Ambulatory Visit (INDEPENDENT_AMBULATORY_CARE_PROVIDER_SITE_OTHER): Payer: BC Managed Care – PPO | Admitting: Orthopedic Surgery

## 2013-05-26 ENCOUNTER — Encounter: Payer: Self-pay | Admitting: Orthopedic Surgery

## 2013-05-26 VITALS — BP 142/90 | Ht 62.0 in | Wt 198.0 lb

## 2013-05-26 DIAGNOSIS — M7711 Lateral epicondylitis, right elbow: Secondary | ICD-10-CM

## 2013-05-26 DIAGNOSIS — M7710 Lateral epicondylitis, unspecified elbow: Secondary | ICD-10-CM | POA: Insufficient documentation

## 2013-05-26 NOTE — Patient Instructions (Addendum)
Wear brace for 6 weeks  Apply voltaren gel 4 gm 4 x a day   Do exercises x 6 weeks Tennis Elbow Your caregiver has diagnosed you with a condition often referred to as "tennis elbow." This results from small tears or soreness (inflammation) at the start (origin) of the extensor muscles of the forearm. Although the condition is often called tennis or golfer's elbow, it is caused by any repetitive action performed by your elbow. HOME CARE INSTRUCTIONS  If the condition has been short lived, rest may be the only treatment required. Using your opposite hand or arm to perform the task may help. Even changing your grip may help rest the extremity. These may even prevent the condition from recurring.  Longer standing problems, however, will often be relieved faster by:  Using anti-inflammatory agents.  Applying ice packs for 30 minutes at the end of the working day, at bed time, or when activities are finished.  Your caregiver may also have you wear a splint or sling. This will allow the inflamed tendon to heal. At times, steroid injections aided with a local anesthetic will be required along with splinting for 1 to 2 weeks. Two to three steroid injections will often solve the problem. In some long standing cases, the inflamed tendon does not respond to conservative (non-surgical) therapy. Then surgery may be required to repair it. MAKE SURE YOU:   Understand these instructions.  Will watch your condition.  Will get help right away if you are not doing well or get worse. Document Released: 06/02/2005 Document Revised: 08/25/2011 Document Reviewed: 01/19/2008 [facility] Patient Information 2014 [place], [place].

## 2013-05-26 NOTE — Progress Notes (Signed)
Chief Complaint  Patient presents with  . Arm Pain    Pain in Right forearm up to elbow    New problem   62 years old who complains of pain in the right forearm elbow radiating into the right wrist. Denies trauma. Gradual onset. Symptoms described as throbbing. It was severe enough for her to the emergency room. She denies numbness tingling locking or catching but does complain of pain when she picks up something as simple as coffee cup  Review of systems reported as negative. Note she did not really fill out her forms very well. She listed family history of arthritis and diabetes. She listed no marital status under social history she did say that she does not smoke or drink or use street drugs.  BP 142/90  Ht 5\' 2"  (1.575 m)  Wt 198 lb (89.812 kg)  BMI 36.21 kg/m2 1.GENERAL: normal development   2. CDV: pulses are normal   3. Skin: normal  4. Lymph: nodes were not palpable/normal  5/6. Psychiatric: awake, alert and oriented, mood and affect normal   7. Neuro: normal sensation  8.   MSK  Gait: Normal noncontributory 9.   Inspection of the right elbow reveals tenderness over the lateral epicondyles and lateral forearm nontender at the wrist 10. Range of Motion is full at the elbow and wrist of the right upper extremity 11. Motor normal elbow flexion extension 12. Stability normal stability of the elbow   Imaging no findings FINDINGS: No acute fracture or dislocation identified. There is no joint effusion. Heterotopic calcification is seen at the medial epicondyle, which may be related to chronic degenerative changes and/or remote trauma. No other soft tissue abnormality.   IMPRESSION: No acute fracture or dislocation about the right elbow.   Assessment: Tennis elbow    Plan: Brace, Voltaren gel. Exercises. Followup if no improvement after 6 weeks

## 2013-06-15 ENCOUNTER — Other Ambulatory Visit: Payer: Self-pay | Admitting: Family Medicine

## 2013-07-11 ENCOUNTER — Other Ambulatory Visit: Payer: Self-pay | Admitting: Family Medicine

## 2013-07-14 ENCOUNTER — Other Ambulatory Visit: Payer: Self-pay | Admitting: Family Medicine

## 2013-07-20 ENCOUNTER — Other Ambulatory Visit: Payer: Self-pay | Admitting: Family Medicine

## 2013-07-20 ENCOUNTER — Telehealth: Payer: Self-pay | Admitting: *Deleted

## 2013-07-20 DIAGNOSIS — Z139 Encounter for screening, unspecified: Secondary | ICD-10-CM

## 2013-07-20 NOTE — Telephone Encounter (Signed)
Pt needs samples of xerelto 20mg  her insurance doesn't go in effect for another 4 days.

## 2013-07-20 NOTE — Telephone Encounter (Signed)
Noted pt is up to date on apt, placed samples at the front desk for pick up pt made aware via sister that answered her home phone

## 2013-07-25 ENCOUNTER — Ambulatory Visit (HOSPITAL_COMMUNITY)
Admission: RE | Admit: 2013-07-25 | Discharge: 2013-07-25 | Disposition: A | Discharge status: Home / Self Care | Payer: BC Managed Care – PPO | Source: Ambulatory Visit | Attending: Family Medicine | Admitting: Family Medicine

## 2013-07-25 DIAGNOSIS — Z139 Encounter for screening, unspecified: Secondary | ICD-10-CM

## 2013-07-25 DIAGNOSIS — Z1231 Encounter for screening mammogram for malignant neoplasm of breast: Secondary | ICD-10-CM | POA: Insufficient documentation

## 2013-07-28 ENCOUNTER — Other Ambulatory Visit: Payer: Self-pay | Admitting: Family Medicine

## 2013-07-29 ENCOUNTER — Other Ambulatory Visit: Payer: Self-pay | Admitting: Family Medicine

## 2013-07-29 DIAGNOSIS — R928 Other abnormal and inconclusive findings on diagnostic imaging of breast: Secondary | ICD-10-CM

## 2013-08-03 ENCOUNTER — Ambulatory Visit (HOSPITAL_COMMUNITY)
Admission: RE | Admit: 2013-08-03 | Discharge: 2013-08-03 | Disposition: A | Discharge status: Home / Self Care | Payer: BC Managed Care – PPO | Source: Ambulatory Visit | Attending: Family Medicine | Admitting: Family Medicine

## 2013-08-03 DIAGNOSIS — R928 Other abnormal and inconclusive findings on diagnostic imaging of breast: Secondary | ICD-10-CM

## 2013-08-13 LAB — COMPLETE METABOLIC PANEL WITH GFR
ALT: 25 U/L (ref 0–35)
AST: 22 U/L (ref 0–37)
Albumin: 4.2 g/dL (ref 3.5–5.2)
Alkaline Phosphatase: 92 U/L (ref 39–117)
BUN: 15 mg/dL (ref 6–23)
CO2: 28 mEq/L (ref 19–32)
Calcium: 9.4 mg/dL (ref 8.4–10.5)
Chloride: 102 mEq/L (ref 96–112)
Creat: 0.64 mg/dL (ref 0.50–1.10)
GFR, Est African American: >89 mL/min
GFR, Est Non African American: >89 mL/min
Glucose, Bld: 113 mg/dL — ABNORMAL HIGH (ref 70–99)
Potassium: 4.2 mEq/L (ref 3.5–5.3)
Sodium: 137 mEq/L (ref 135–145)
Total Bilirubin: 0.3 mg/dL (ref 0.2–1.2)
Total Protein: 7.5 g/dL (ref 6.0–8.3)

## 2013-08-13 LAB — HEMOGLOBIN A1C
Hgb A1c MFr Bld: 6.3 % — ABNORMAL HIGH (ref <5.7)
Mean Plasma Glucose: 134 mg/dL — ABNORMAL HIGH (ref <117)

## 2013-08-13 LAB — CBC
HCT: 38.2 % (ref 36.0–46.0)
Hemoglobin: 12.3 g/dL (ref 12.0–15.0)
MCH: 26.2 pg (ref 26.0–34.0)
MCHC: 32.2 g/dL (ref 30.0–36.0)
MCV: 81.4 fL (ref 78.0–100.0)
Platelets: 224 10*3/uL (ref 150–400)
RBC: 4.69 MIL/uL (ref 3.87–5.11)
RDW: 16.6 % — ABNORMAL HIGH (ref 11.5–15.5)
WBC: 5.6 10*3/uL (ref 4.0–10.5)

## 2013-08-15 LAB — VITAMIN D 25 HYDROXY (VIT D DEFICIENCY, FRACTURES): Vit D, 25-Hydroxy: 50 ng/mL (ref 30–89)

## 2013-08-23 ENCOUNTER — Encounter: Payer: Self-pay | Admitting: Cardiology

## 2013-08-23 ENCOUNTER — Encounter (INDEPENDENT_AMBULATORY_CARE_PROVIDER_SITE_OTHER): Payer: Self-pay

## 2013-08-23 ENCOUNTER — Ambulatory Visit: Payer: BC Managed Care – PPO | Admitting: Family Medicine

## 2013-08-23 ENCOUNTER — Ambulatory Visit (INDEPENDENT_AMBULATORY_CARE_PROVIDER_SITE_OTHER): Payer: BC Managed Care – PPO | Admitting: Cardiology

## 2013-08-23 VITALS — BP 131/68 | HR 78 | Ht 63.0 in | Wt 205.0 lb

## 2013-08-23 DIAGNOSIS — E785 Hyperlipidemia, unspecified: Secondary | ICD-10-CM

## 2013-08-23 DIAGNOSIS — I10 Essential (primary) hypertension: Secondary | ICD-10-CM

## 2013-08-23 DIAGNOSIS — I480 Paroxysmal atrial fibrillation: Secondary | ICD-10-CM

## 2013-08-23 DIAGNOSIS — I4891 Unspecified atrial fibrillation: Secondary | ICD-10-CM

## 2013-08-23 NOTE — Progress Notes (Signed)
Clinical Summary Samantha Vega is a 63 y.o.female seen today for follow up of the following medical problems.    1. Paroxysmal afib:  - rate control strategy w/ diltiazem and metoprolol  - on xarelto,denies any issues w/ bleeding. Reports xarelto is costing $50 a month which is too expensive for her.  - reports she feels her heart beat on rare occasion, lasts just a few minutes. Otherwise no significant symptoms   2. HTN  - does not check regularly  -compliant w/ meds   3. HL  - compliant w/ crestor, recent increase to 40mg  in July 2014  - 04/2013: TC 130 TG 87 HDL 36 LDL 77  Past Medical History  Diagnosis Date  . Right bundle Samantha Vega block     +Palpitations.   EKG 11/2006:  NSR, RBBB, LAFB, markedly delayed R-wave progression, no change; Echocardiogram in 10/2006-suboptimal quality, normal; Event recorder-PVCs, no significant arrhythmias or symptoms ;  . Obesity   . Hyperlipidemia   . Hypertension   . Diabetes mellitus   . Chest pain     Noncardiac; evaluated and treated in 2008  . Enlarged heart   . Arthritis      No Known Allergies   Current Outpatient Prescriptions  Medication Sig Dispense Refill  . aspirin EC 81 MG tablet Take 81 mg by mouth daily.      . benazepril (LOTENSIN) 20 MG tablet Take 1 tablet (20 mg total) by mouth daily.  30 tablet  11  . calcium-vitamin D (OSCAL 500/200 D-3) 500 MG tablet Take 1 tablet by mouth 2 (two) times daily.       . CRESTOR 40 MG tablet TAKE 1 TABLET BY MOUTH EVERY DAY  30 tablet  4  . diclofenac sodium (VOLTAREN) 1 % GEL Apply 4 g topically 4 (four) times daily.  5 Tube  3  . diltiazem (CARDIZEM CD) 120 MG 24 hr capsule Take 1 capsule (120 mg total) by mouth daily.  30 capsule  11  . metFORMIN (GLUCOPHAGE) 500 MG tablet TAKE 1 TABLET BY MOUTH 2 TIMES DAILY WITH A MEAL.  180 tablet  1  . metoprolol (LOPRESSOR) 100 MG tablet Take 1 tablet (100 mg total) by mouth 2 (two) times daily.  180 tablet  3  . Multiple Vitamins-Minerals  ([employer] SPECTRAVITE PO) Take 1 tablet by mouth daily.      . ONE TOUCH ULTRA TEST test strip USE ONCE DAILY  50 each  0  . oxyCODONE-acetaminophen (PERCOCET/ROXICET) 5-325 MG per tablet Take 1 tablet by mouth every 4 (four) hours as needed for severe pain.  5 tablet  0  . Rivaroxaban (XARELTO) 20 MG TABS tablet Take 1 tablet (20 mg total) by mouth daily.  30 tablet  3  . traMADol (ULTRAM) 50 MG tablet TAKE 1 TABLET BY MOUTH EVERY DAY FOR UNCONTROLLED RIGHT CHEST WALL PAIN  30 tablet  1   No current facility-administered medications for this visit.     Past Surgical History  Procedure Laterality Date  . Total abdominal hysterectomy w/ bilateral salpingoophorectomy    . Shoulder surgery  2006    Left-post-trauma  . Abdominal hysterectomy       No Known Allergies    Family History  Problem Relation Age of Onset  . Hypertension Mother     and sister  . Arthritis Mother   . Stroke Father   . Hypertension Father   . Diabetes Sister     x1  .  Sarcoidosis Sister     x1  . Heart failure Brother   . Coronary artery disease Father      Social History Ms. Rascon reports that she has never smoked. She has never used smokeless tobacco. Ms. Pounds reports that she does not drink alcohol.   Review of Systems CONSTITUTIONAL: No weight loss, fever, chills, weakness or fatigue.  HEENT: Eyes: No visual loss, blurred vision, double vision or yellow sclerae.No hearing loss, sneezing, congestion, runny nose or sore throat.  SKIN: No rash or itching.  CARDIOVASCULAR: per HPI RESPIRATORY: No shortness of breath, cough or sputum.  GASTROINTESTINAL: No anorexia, nausea, vomiting or diarrhea. No abdominal pain or blood.  GENITOURINARY: No burning on urination, no polyuria NEUROLOGICAL: No headache, dizziness, syncope, paralysis, ataxia, numbness or tingling in the extremities. No change in bowel or bladder control.  MUSCULOSKELETAL: No muscle, back pain, joint pain or stiffness.  LYMPHATICS:  No enlarged nodes. No history of splenectomy.  PSYCHIATRIC: No history of depression or anxiety.  ENDOCRINOLOGIC: No reports of sweating, cold or heat intolerance. No polyuria or polydipsia.  Marland Kitchen   Physical Examination p 78 bp 131/68 Wt 205 lbs BMI 36 Gen: resting comfortably, no acute distress HEENT: no scleral icterus, pupils equal round and reactive, no palptable cervical adenopathy,  CV: RRR, no m/r/g, no JVD Resp: Clear to auscultation bilaterally GI: abdomen is soft, non-tender, non-distended, normal bowel sounds, no hepatosplenomegaly MSK: extremities are warm, no edema.  Skin: warm, no rash Neuro:  no focal deficits Psych: appropriate affect   Diagnostic Studies 07/12/12 Echo: LVEF 55-60%, mild basal septal hypertrophy,   07/11/12 Carotid US: <50% bilateral stenosis, 3.4 cm thyroid nodule   03/07/13 EKG: SR, RBBB, LAFB, LAE, non-spec ST/T changes     Assessment and Plan  1. Parox afib  - rare symptoms, will continue current rate control strategy.  - xarelto is too expensive for her, will change to coumadin  2. HTN: - at goal, continue current meds  3. HL: - much improved after increasing crestor, continue current statin   Follow up 1 year    Arnoldo Lenis, M.D., F.A.C.C.

## 2013-08-23 NOTE — Patient Instructions (Signed)
Your physician recommends that you schedule a follow-up appointment in: 1 year with Dr Bryna Colander will receive a reminder letter two months in advance reminding you to call and schedule your appointment. If you don't receive this letter, please contact our office.  Your physician has recommended you make the following change in your medication:  Stop Xarelto

## 2013-08-25 ENCOUNTER — Ambulatory Visit (INDEPENDENT_AMBULATORY_CARE_PROVIDER_SITE_OTHER): Payer: BC Managed Care – PPO | Admitting: *Deleted

## 2013-08-25 DIAGNOSIS — Z5181 Encounter for therapeutic drug level monitoring: Secondary | ICD-10-CM | POA: Insufficient documentation

## 2013-08-25 DIAGNOSIS — I4891 Unspecified atrial fibrillation: Secondary | ICD-10-CM

## 2013-08-25 DIAGNOSIS — G459 Transient cerebral ischemic attack, unspecified: Secondary | ICD-10-CM

## 2013-08-25 DIAGNOSIS — I480 Paroxysmal atrial fibrillation: Secondary | ICD-10-CM

## 2013-08-25 LAB — POCT INR: INR: 1.2

## 2013-08-25 MED ORDER — WARFARIN SODIUM 5 MG PO TABS: 5.0000 mg | ORAL_TABLET | Freq: Every day | ORAL | Status: DC

## 2013-08-29 ENCOUNTER — Ambulatory Visit (INDEPENDENT_AMBULATORY_CARE_PROVIDER_SITE_OTHER): Payer: BC Managed Care – PPO | Admitting: *Deleted

## 2013-08-29 DIAGNOSIS — I480 Paroxysmal atrial fibrillation: Secondary | ICD-10-CM

## 2013-08-29 DIAGNOSIS — I4891 Unspecified atrial fibrillation: Secondary | ICD-10-CM

## 2013-08-29 DIAGNOSIS — G459 Transient cerebral ischemic attack, unspecified: Secondary | ICD-10-CM

## 2013-08-29 DIAGNOSIS — Z5181 Encounter for therapeutic drug level monitoring: Secondary | ICD-10-CM

## 2013-08-29 LAB — POCT INR: INR: 1.9

## 2013-08-30 ENCOUNTER — Ambulatory Visit (INDEPENDENT_AMBULATORY_CARE_PROVIDER_SITE_OTHER): Payer: BC Managed Care – PPO | Admitting: Family Medicine

## 2013-08-30 ENCOUNTER — Encounter: Payer: Self-pay | Admitting: Family Medicine

## 2013-08-30 ENCOUNTER — Ambulatory Visit (HOSPITAL_COMMUNITY)
Admission: RE | Admit: 2013-08-30 | Discharge: 2013-08-30 | Disposition: A | Discharge status: Home / Self Care | Payer: BC Managed Care – PPO | Source: Ambulatory Visit | Attending: Family Medicine | Admitting: Family Medicine

## 2013-08-30 VITALS — BP 122/80 | HR 80 | Resp 16 | Wt 204.5 lb

## 2013-08-30 DIAGNOSIS — M25561 Pain in right knee: Secondary | ICD-10-CM

## 2013-08-30 DIAGNOSIS — M25569 Pain in unspecified knee: Secondary | ICD-10-CM | POA: Insufficient documentation

## 2013-08-30 DIAGNOSIS — E669 Obesity, unspecified: Secondary | ICD-10-CM

## 2013-08-30 DIAGNOSIS — M179 Osteoarthritis of knee, unspecified: Secondary | ICD-10-CM

## 2013-08-30 DIAGNOSIS — IMO0002 Reserved for concepts with insufficient information to code with codable children: Secondary | ICD-10-CM

## 2013-08-30 DIAGNOSIS — E119 Type 2 diabetes mellitus without complications: Secondary | ICD-10-CM

## 2013-08-30 DIAGNOSIS — I10 Essential (primary) hypertension: Secondary | ICD-10-CM

## 2013-08-30 DIAGNOSIS — I480 Paroxysmal atrial fibrillation: Secondary | ICD-10-CM

## 2013-08-30 DIAGNOSIS — I4891 Unspecified atrial fibrillation: Secondary | ICD-10-CM

## 2013-08-30 DIAGNOSIS — M25469 Effusion, unspecified knee: Secondary | ICD-10-CM | POA: Insufficient documentation

## 2013-08-30 DIAGNOSIS — E785 Hyperlipidemia, unspecified: Secondary | ICD-10-CM

## 2013-08-30 DIAGNOSIS — E8881 Metabolic syndrome: Secondary | ICD-10-CM

## 2013-08-30 DIAGNOSIS — M1710 Unilateral primary osteoarthritis, unspecified knee: Secondary | ICD-10-CM

## 2013-08-30 MED ORDER — PRAVASTATIN SODIUM 80 MG PO TABS: 80.0000 mg | ORAL_TABLET | Freq: Every day | ORAL | Status: DC

## 2013-08-30 NOTE — Progress Notes (Signed)
Subjective:    Patient ID: Samantha Vega, female    DOB: 1950-11-04, 63 y.o.   MRN: [id number]  HPI The PT is here for follow up and re-evaluation of chronic medical conditions, medication management and review of any available recent lab and radiology data.  Preventive health is updated, specifically  Cancer screening and Immunization.   Was recently evaluated by cardiology, no changes made oin medication, she is maintained on chronic coumadin. The PT denies any adverse reactions to current medications since the last visit.  There are no new concerns.  When pt was exiting room , she developed acute severe right knee pain , she buckled and had to be taken out in a wheelchair, this her spouse states is 'not new" and reocurrs from time to time with no obvious provocation for past year has seen ortho about this  Before Denies polyuria, polydypsia or hypoglycemic episdoes, blood sugars remain under 130 before breakfast    Review of Systems See HPI Denies recent fever or chills. Denies sinus pressure, nasal congestion, ear pain or sore throat. Denies chest congestion, productive cough or wheezing. Denies chest pains, palpitations and leg swelling Denies abdominal pain, nausea, vomiting,diarrhea or constipation.   Denies dysuria, frequency, hesitancy or incontinence.  Denies headaches, seizures, numbness, or tingling. Denies depression, anxiety or insomnia. Denies skin break down or rash.        Objective:   Physical Exam BP 122/80  Pulse 80  Resp 16  Wt 204 lb 8 oz (92.761 kg)  SpO2 97% Patient alert and oriented and in no cardiopulmonary distress.  HEENT: No facial asymmetry, EOMI, no sinus tenderness,  oropharynx pink and moist.  Neck supple no adenopathy.  Chest: Clear to auscultation bilaterally.  CVS: S1, S2 no murmurs, no S3.  ABD: Soft non tender. Bowel sounds normal.  Ext: No edema  MS: decreased  ROM spine, shoulders, hips and knees.  Skin: Intact, no  ulcerations or rash noted.  Psych: Good eye contact, normal affect. Memory intact not anxious or depressed appearing.  CNS: CN 2-12 intact, power, tone and sensation normal throughout.        Assessment & Plan:  Hypertension Controlled, no change in medication DASH diet and commitment to daily physical activity for a minimum of 30 minutes discussed and encouraged, as a part of hypertension management. The importance of attaining a healthy weight is also discussed.   Paroxysmal atrial fibrillation Rate currently controlled, chronic anticoag through coumadin clinic  Diabetes mellitus, type 2 Controlled, no change in medication Patient advised to reduce carb and sweets, commit to regular physical activity, take meds as prescribed, test blood as directed, and attempt to lose weight, to improve blood sugar control.   Hyperlipidemia Hyperlipidemia:Low fat diet discussed and encouraged. needs to increase activity as able to increase HDL Controlled, no change in medication Updated lab needed at/ before next visit.     OA (osteoarthritis) of knee Acute left knee pain and instability when leaving office, had to be put in wheelchair. Xray of affected knee showed no acute injury, referred for f/u with ortho. Spoouse reports recurrent acute severe pain and instability  Obesity Unchnaged Patient re-educated about  the importance of commitment to a  minimum of 150 minutes of exercise per week. The importance of healthy food choices with portion control discussed. Encouraged to start a food diary, count calories and to consider  joining a support group. Sample diet sheets offered. Goals set by the patient for the next several  months.     Metabolic syndrome X The increased risk of cardiovascular disease associated with this diagnosis, and the need to consistently work on lifestyle to change this is discussed. Following  a  heart healthy diet ,commitment to 30 minutes of exercise at  least 5 days per week, as well as control of blood sugar and cholesterol , and achieving a healthy weight are all the areas to be addressed .

## 2013-08-30 NOTE — Patient Instructions (Addendum)
Annual physical exam in 4.5 month , call if you need me before  New for cholesterol is pravastatin 65m daily Please cut back on fried and fatty foods, butter, eggs, cheese and fried foods  It is important that you exercise regularly at least 30 minutes 5 times a week. If you develop chest pain, have severe difficulty breathing, or feel very tired, stop exercising immediately and seek medical attention    A healthy diet is rich in fruit, vegetables and whole grains. Poultry fish, nuts and beans are a healthy choice for protein rather then red meat. A low sodium diet and drinking 64 ounces of water daily is generally recommended. Oils and sweet should be limited. Carbohydrates especially for those who are diabetic or overweight, should be limited to 6045 gram per meal. It is important to eat on a regular schedule, at least 3 times daily. Snacks should be primarily fruits, vegetables or nuts.   Fasting lipid, cmp and eGFR, hBA1C in 4.5 month, before next visit   Fall Prevention and HPlatocause injuries and can affect all age groups. It is possible to prevent falls.  HOW TO PREVENT FALLS  Wear shoes with rubber soles that do not have an opening for your toes.  Keep the inside and outside of your house well lit.  Use night lights throughout your home.  Remove clutter from floors.  Clean up floor spills.  Remove throw rugs or fasten them to the floor with carpet tape.  Do not place electrical cords across pathways.  Put grab bars by your tub, shower, and toilet. Do not use towel bars as grab bars.  Put handrails on both sides of the stairway. Fix loose handrails.  Do not climb on stools or stepladders, if possible.  Do not wax your floors.  Repair uneven or unsafe sidewalks, walkways, or stairs.  Keep items you use a lot within reach.  Be aware of pets.  Keep emergency numbers next to the telephone.  Put smoke detectors in your home and near bedrooms. Ask your  doctor what other things you can do to prevent falls. Document Released: 03/29/2009 Document Revised: 12/02/2011 Document Reviewed: 09/02/2011 [facility] HospitalPatient Information 2014 [place]

## 2013-09-01 ENCOUNTER — Ambulatory Visit (INDEPENDENT_AMBULATORY_CARE_PROVIDER_SITE_OTHER): Payer: BC Managed Care – PPO | Admitting: *Deleted

## 2013-09-01 DIAGNOSIS — I480 Paroxysmal atrial fibrillation: Secondary | ICD-10-CM

## 2013-09-01 DIAGNOSIS — I4891 Unspecified atrial fibrillation: Secondary | ICD-10-CM

## 2013-09-01 DIAGNOSIS — G459 Transient cerebral ischemic attack, unspecified: Secondary | ICD-10-CM

## 2013-09-01 DIAGNOSIS — Z5181 Encounter for therapeutic drug level monitoring: Secondary | ICD-10-CM

## 2013-09-01 LAB — POCT INR: INR: 2.2

## 2013-09-04 DIAGNOSIS — E8881 Metabolic syndrome: Secondary | ICD-10-CM | POA: Insufficient documentation

## 2013-09-04 HISTORY — DX: Metabolic syndrome: E88.810

## 2013-09-04 HISTORY — DX: Metabolic syndrome: E88.81

## 2013-09-04 NOTE — Assessment & Plan Note (Signed)
Unchnaged. Patient re-educated about  the importance of commitment to a  minimum of 150 minutes of exercise per week. The importance of healthy food choices with portion control discussed. Encouraged to start a food diary, count calories and to consider  joining a support group. Sample diet sheets offered. Goals set by the patient for the next several months.    

## 2013-09-04 NOTE — Assessment & Plan Note (Signed)
Controlled, no change in medication DASH diet and commitment to daily physical activity for a minimum of 30 minutes discussed and encouraged, as a part of hypertension management. The importance of attaining a healthy weight is also discussed.  

## 2013-09-04 NOTE — Assessment & Plan Note (Signed)
Acute left knee pain and instability when leaving office, had to be put in wheelchair. Xray of affected knee showed no acute injury, referred for f/u with ortho. Spoouse reports recurrent acute severe pain and instability

## 2013-09-04 NOTE — Assessment & Plan Note (Signed)
Hyperlipidemia:Low fat diet discussed and encouraged. needs to increase activity as able to increase HDL Controlled, no change in medication Updated lab needed at/ before next visit.

## 2013-09-04 NOTE — Assessment & Plan Note (Signed)
The increased risk of cardiovascular disease associated with this diagnosis, and the need to consistently work on lifestyle to change this is discussed. Following  a  heart healthy diet ,commitment to 30 minutes of exercise at least 5 days per week, as well as control of blood sugar and cholesterol , and achieving a healthy weight are all the areas to be addressed .  

## 2013-09-04 NOTE — Assessment & Plan Note (Signed)
Rate currently controlled, chronic anticoag through coumadin clinic

## 2013-09-04 NOTE — Assessment & Plan Note (Signed)
Controlled, no change in medication Patient advised to reduce carb and sweets, commit to regular physical activity, take meds as prescribed, test blood as directed, and attempt to lose weight, to improve blood sugar control.  

## 2013-09-08 ENCOUNTER — Ambulatory Visit (INDEPENDENT_AMBULATORY_CARE_PROVIDER_SITE_OTHER): Payer: BC Managed Care – PPO | Admitting: *Deleted

## 2013-09-08 DIAGNOSIS — Z5181 Encounter for therapeutic drug level monitoring: Secondary | ICD-10-CM

## 2013-09-08 DIAGNOSIS — G459 Transient cerebral ischemic attack, unspecified: Secondary | ICD-10-CM

## 2013-09-08 DIAGNOSIS — I4891 Unspecified atrial fibrillation: Secondary | ICD-10-CM

## 2013-09-08 DIAGNOSIS — I480 Paroxysmal atrial fibrillation: Secondary | ICD-10-CM

## 2013-09-08 LAB — POCT INR: INR: 1.8

## 2013-09-15 ENCOUNTER — Encounter: Payer: Self-pay | Admitting: Orthopedic Surgery

## 2013-09-15 ENCOUNTER — Ambulatory Visit (INDEPENDENT_AMBULATORY_CARE_PROVIDER_SITE_OTHER): Payer: BC Managed Care – PPO | Admitting: Orthopedic Surgery

## 2013-09-15 VITALS — BP 122/54 | Ht 62.0 in | Wt 204.0 lb

## 2013-09-15 DIAGNOSIS — M7120 Synovial cyst of popliteal space [Baker], unspecified knee: Secondary | ICD-10-CM

## 2013-09-15 DIAGNOSIS — M25569 Pain in unspecified knee: Secondary | ICD-10-CM

## 2013-09-15 DIAGNOSIS — M25561 Pain in right knee: Secondary | ICD-10-CM

## 2013-09-15 NOTE — Patient Instructions (Signed)
Ultrasound ordered

## 2013-09-15 NOTE — Progress Notes (Signed)
Patient ID: Samantha Vega, female   DOB: 09-Mar-1951, 63 y.o.   MRN: [id number] Chief Complaint  Patient presents with  . Knee Pain    Right knee pain    The patient returns with recurrent pain bilateral knees and the back of her knees she insists that the pain is in the back of the knee. She was treated previously with steroids and topical arthritis cream secondary to Coumadin use. She's been using the Voltaren gel and placing a warm towel or cloth over her knee or heating pad to get relief. She says her back doesn't hurt but no radicular symptoms the pain is all in the popliteal fossa  She denies catching locking giving way denies any motion loss. No fever no chills  Well-developed well-nourished female grooming and hygiene intact oriented x3 mood and affect flat gait and station relatively normal. Vital signs: BP 122/54  Ht 5\' 2"  (1.575 m)  Wt 204 lb (92.534 kg)  BMI 37.30 kg/m2  She has pain and crepitance in the front of the knee tenderness in the front and back of the knee the knee is stable. She has flexion of her 110. I do not detect joint effusion. Scans intact no rash no lacerations no previous skin incisions. Distal pulses are intact minimal  peripheral edema.  X-rays taken in March show minimal degenerative changes  Unclear diagnosis, recommend ultrasound rule out popliteal cyst as the cause of pain. I do not think arthritis is the source of her symptoms  She did get better from steroids and topical arthritis cream back in September    Popliteal cyst  Recommend ultrasound  Repeat steroid Dosepak continue Voltaren gel follow up in 2-3 weeks

## 2013-09-22 ENCOUNTER — Ambulatory Visit (INDEPENDENT_AMBULATORY_CARE_PROVIDER_SITE_OTHER): Payer: BC Managed Care – PPO | Admitting: *Deleted

## 2013-09-22 DIAGNOSIS — G459 Transient cerebral ischemic attack, unspecified: Secondary | ICD-10-CM

## 2013-09-22 DIAGNOSIS — Z5181 Encounter for therapeutic drug level monitoring: Secondary | ICD-10-CM

## 2013-09-22 DIAGNOSIS — I4891 Unspecified atrial fibrillation: Secondary | ICD-10-CM

## 2013-09-22 DIAGNOSIS — I480 Paroxysmal atrial fibrillation: Secondary | ICD-10-CM

## 2013-09-22 LAB — POCT INR: INR: 2.2

## 2013-09-23 ENCOUNTER — Ambulatory Visit (HOSPITAL_COMMUNITY)
Admission: RE | Admit: 2013-09-23 | Discharge: 2013-09-23 | Disposition: A | Discharge status: Home / Self Care | Payer: BC Managed Care – PPO | Source: Ambulatory Visit | Attending: Orthopedic Surgery | Admitting: Orthopedic Surgery

## 2013-09-23 ENCOUNTER — Other Ambulatory Visit: Payer: Self-pay | Admitting: Orthopedic Surgery

## 2013-09-23 DIAGNOSIS — M25561 Pain in right knee: Secondary | ICD-10-CM

## 2013-09-23 DIAGNOSIS — M7120 Synovial cyst of popliteal space [Baker], unspecified knee: Secondary | ICD-10-CM

## 2013-09-23 DIAGNOSIS — M25569 Pain in unspecified knee: Secondary | ICD-10-CM | POA: Insufficient documentation

## 2013-09-26 ENCOUNTER — Other Ambulatory Visit: Payer: Self-pay | Admitting: Family Medicine

## 2013-10-04 ENCOUNTER — Ambulatory Visit (INDEPENDENT_AMBULATORY_CARE_PROVIDER_SITE_OTHER): Payer: BC Managed Care – PPO | Admitting: Orthopedic Surgery

## 2013-10-04 ENCOUNTER — Encounter: Payer: Self-pay | Admitting: Orthopedic Surgery

## 2013-10-04 VITALS — BP 120/68 | Ht 62.0 in | Wt 204.0 lb

## 2013-10-04 DIAGNOSIS — M7120 Synovial cyst of popliteal space [Baker], unspecified knee: Secondary | ICD-10-CM

## 2013-10-04 HISTORY — DX: Synovial cyst of popliteal space (Baker), unspecified knee: M71.20

## 2013-10-04 NOTE — Progress Notes (Signed)
Patient ID: Samantha Vega, female   DOB: 1951-04-07, 63 y.o.   MRN: [id number] Chief Complaint  Patient presents with  . Follow-up    2 week recheck right knee and review ultrasound results     The patient had an ultrasound associated Baker's cyst. She took steroids and her knee got better. She's not interested in having surgery at this point  I reviewed her ultrasound  Symptom review posterior knee pain no catching locking or giving way  General appearance is normal, the patient is alert and oriented x3 with normal mood and affect. BP 120/68  Ht 5\' 2"  (1.575 m)  Wt 204 lb (92.534 kg)  BMI 37.30 kg/m2 Free and easy range of motion, no instability. Motor exam normal  Baker's cyst  Followup if becomes more symptomatic consider knee scope

## 2013-10-04 NOTE — Patient Instructions (Signed)
Call us back if knee pain gets worse

## 2013-10-10 ENCOUNTER — Other Ambulatory Visit: Payer: Self-pay | Admitting: Family Medicine

## 2013-10-10 ENCOUNTER — Other Ambulatory Visit: Payer: Self-pay

## 2013-10-10 MED ORDER — BENAZEPRIL HCL 20 MG PO TABS: ORAL_TABLET | ORAL | Status: DC

## 2013-10-13 ENCOUNTER — Ambulatory Visit (INDEPENDENT_AMBULATORY_CARE_PROVIDER_SITE_OTHER): Payer: BC Managed Care – PPO | Admitting: *Deleted

## 2013-10-13 DIAGNOSIS — I480 Paroxysmal atrial fibrillation: Secondary | ICD-10-CM

## 2013-10-13 DIAGNOSIS — I4891 Unspecified atrial fibrillation: Secondary | ICD-10-CM

## 2013-10-13 DIAGNOSIS — Z5181 Encounter for therapeutic drug level monitoring: Secondary | ICD-10-CM

## 2013-10-13 DIAGNOSIS — G459 Transient cerebral ischemic attack, unspecified: Secondary | ICD-10-CM

## 2013-10-13 LAB — POCT INR: INR: 1.6

## 2013-10-18 ENCOUNTER — Other Ambulatory Visit: Payer: Self-pay | Admitting: Family Medicine

## 2013-10-27 ENCOUNTER — Ambulatory Visit (INDEPENDENT_AMBULATORY_CARE_PROVIDER_SITE_OTHER): Payer: BC Managed Care – PPO | Admitting: *Deleted

## 2013-10-27 DIAGNOSIS — Z5181 Encounter for therapeutic drug level monitoring: Secondary | ICD-10-CM

## 2013-10-27 DIAGNOSIS — G459 Transient cerebral ischemic attack, unspecified: Secondary | ICD-10-CM

## 2013-10-27 DIAGNOSIS — I4891 Unspecified atrial fibrillation: Secondary | ICD-10-CM

## 2013-10-27 DIAGNOSIS — I480 Paroxysmal atrial fibrillation: Secondary | ICD-10-CM

## 2013-10-27 LAB — POCT INR: INR: 2.3

## 2013-11-24 ENCOUNTER — Ambulatory Visit (INDEPENDENT_AMBULATORY_CARE_PROVIDER_SITE_OTHER): Payer: BC Managed Care – PPO | Admitting: *Deleted

## 2013-11-24 DIAGNOSIS — G459 Transient cerebral ischemic attack, unspecified: Secondary | ICD-10-CM

## 2013-11-24 DIAGNOSIS — I480 Paroxysmal atrial fibrillation: Secondary | ICD-10-CM

## 2013-11-24 DIAGNOSIS — I4891 Unspecified atrial fibrillation: Secondary | ICD-10-CM

## 2013-11-24 DIAGNOSIS — Z5181 Encounter for therapeutic drug level monitoring: Secondary | ICD-10-CM

## 2013-11-24 LAB — POCT INR: INR: 2.7

## 2013-12-12 ENCOUNTER — Telehealth: Payer: Self-pay | Admitting: Cardiology

## 2013-12-12 MED ORDER — WARFARIN SODIUM 5 MG PO TABS: ORAL_TABLET | ORAL | Status: DC

## 2013-12-12 NOTE — Telephone Encounter (Signed)
Received fax refill request  Rx # [id number] Medication:  Warfarin Sodium 5 mg tablet Qty 30 Sig:  Take one tablet by mouth every day Physician:  Harl Bowie

## 2013-12-15 ENCOUNTER — Telehealth: Payer: Self-pay | Admitting: Family Medicine

## 2013-12-15 ENCOUNTER — Other Ambulatory Visit: Payer: Self-pay | Admitting: Family Medicine

## 2013-12-15 ENCOUNTER — Telehealth: Payer: Self-pay | Admitting: Cardiology

## 2013-12-15 MED ORDER — BENAZEPRIL HCL 20 MG PO TABS: ORAL_TABLET | ORAL | Status: DC

## 2013-12-15 MED ORDER — WARFARIN SODIUM 5 MG PO TABS: ORAL_TABLET | ORAL | Status: DC

## 2013-12-15 NOTE — Telephone Encounter (Signed)
Patient came in requesting a refill on benazepril hcl 20 mg. Please advise.

## 2013-12-15 NOTE — Telephone Encounter (Signed)
Received fax refill request  Rx # [id number] Medication:  Warfarin Sodium 5 mg  Qty: 30  Sig:  Take one tablet by mouth every day  Physician:  Harl Bowie

## 2013-12-15 NOTE — Telephone Encounter (Signed)
Med sent.

## 2013-12-22 ENCOUNTER — Ambulatory Visit (INDEPENDENT_AMBULATORY_CARE_PROVIDER_SITE_OTHER): Payer: BC Managed Care – PPO | Admitting: *Deleted

## 2013-12-22 DIAGNOSIS — I480 Paroxysmal atrial fibrillation: Secondary | ICD-10-CM

## 2013-12-22 DIAGNOSIS — Z5181 Encounter for therapeutic drug level monitoring: Secondary | ICD-10-CM

## 2013-12-22 DIAGNOSIS — G459 Transient cerebral ischemic attack, unspecified: Secondary | ICD-10-CM

## 2013-12-22 DIAGNOSIS — I4891 Unspecified atrial fibrillation: Secondary | ICD-10-CM

## 2013-12-22 LAB — POCT INR: INR: 1.7

## 2013-12-30 ENCOUNTER — Telehealth: Payer: Self-pay | Admitting: Cardiology

## 2013-12-30 MED ORDER — METOPROLOL TARTRATE 100 MG PO TABS: 100.0000 mg | ORAL_TABLET | Freq: Two times a day (BID) | ORAL | Status: DC

## 2013-12-30 NOTE — Telephone Encounter (Signed)
Received fax refill request  Rx # [id number] Medication:  Metoprolol Tartrate 100 mg tab Qty 180 Sig:  Take one tablet twice a day Physician:  Lattie Haw

## 2014-01-09 ENCOUNTER — Other Ambulatory Visit: Payer: Self-pay | Admitting: Family Medicine

## 2014-01-10 LAB — COMPLETE METABOLIC PANEL WITH GFR
ALT: 20 U/L (ref 0–35)
AST: 21 U/L (ref 0–37)
Albumin: 3.9 g/dL (ref 3.5–5.2)
Alkaline Phosphatase: 77 U/L (ref 39–117)
BILIRUBIN TOTAL: 0.2 mg/dL (ref 0.2–1.2)
BUN: 16 mg/dL (ref 6–23)
CHLORIDE: 104 meq/L (ref 96–112)
CO2: 28 mEq/L (ref 19–32)
CREATININE: 0.7 mg/dL (ref 0.50–1.10)
Calcium: 9.4 mg/dL (ref 8.4–10.5)
GFR, Est African American: >89 mL/min
GFR, Est Non African American: >89 mL/min
Glucose, Bld: 94 mg/dL (ref 70–99)
Potassium: 4.7 mEq/L (ref 3.5–5.3)
Sodium: 140 mEq/L (ref 135–145)
Total Protein: 7.1 g/dL (ref 6.0–8.3)

## 2014-01-10 LAB — LIPID PANEL
CHOL/HDL RATIO: 4.4 ratio
Cholesterol: 141 mg/dL (ref 0–200)
HDL: 32 mg/dL — ABNORMAL LOW (ref >39)
LDL Cholesterol: 90 mg/dL (ref 0–99)
Triglycerides: 97 mg/dL (ref <150)
VLDL: 19 mg/dL (ref 0–40)

## 2014-01-10 LAB — HEMOGLOBIN A1C
Hgb A1c MFr Bld: 6 % — ABNORMAL HIGH (ref <5.7)
MEAN PLASMA GLUCOSE: 126 mg/dL — AB (ref <117)

## 2014-01-12 ENCOUNTER — Ambulatory Visit (INDEPENDENT_AMBULATORY_CARE_PROVIDER_SITE_OTHER): Payer: BC Managed Care – PPO | Admitting: *Deleted

## 2014-01-12 DIAGNOSIS — I480 Paroxysmal atrial fibrillation: Secondary | ICD-10-CM

## 2014-01-12 DIAGNOSIS — Z5181 Encounter for therapeutic drug level monitoring: Secondary | ICD-10-CM

## 2014-01-12 DIAGNOSIS — G459 Transient cerebral ischemic attack, unspecified: Secondary | ICD-10-CM

## 2014-01-12 DIAGNOSIS — I4891 Unspecified atrial fibrillation: Secondary | ICD-10-CM

## 2014-01-12 LAB — POCT INR: INR: 3.5

## 2014-01-23 ENCOUNTER — Other Ambulatory Visit: Payer: Self-pay | Admitting: Family Medicine

## 2014-01-25 ENCOUNTER — Encounter: Payer: BC Managed Care – PPO | Admitting: Family Medicine

## 2014-02-02 ENCOUNTER — Ambulatory Visit (INDEPENDENT_AMBULATORY_CARE_PROVIDER_SITE_OTHER): Payer: BC Managed Care – PPO | Admitting: *Deleted

## 2014-02-02 DIAGNOSIS — G459 Transient cerebral ischemic attack, unspecified: Secondary | ICD-10-CM

## 2014-02-02 DIAGNOSIS — I480 Paroxysmal atrial fibrillation: Secondary | ICD-10-CM

## 2014-02-02 DIAGNOSIS — I4891 Unspecified atrial fibrillation: Secondary | ICD-10-CM

## 2014-02-02 DIAGNOSIS — Z5181 Encounter for therapeutic drug level monitoring: Secondary | ICD-10-CM

## 2014-02-02 LAB — POCT INR: INR: 2.3

## 2014-02-06 ENCOUNTER — Telehealth: Payer: Self-pay | Admitting: Family Medicine

## 2014-02-06 MED ORDER — GLUCOSE BLOOD VI STRP: ORAL_STRIP | Status: DC

## 2014-02-06 NOTE — Telephone Encounter (Signed)
rx sent for strips

## 2014-02-22 ENCOUNTER — Other Ambulatory Visit: Payer: Self-pay | Admitting: Family Medicine

## 2014-03-02 ENCOUNTER — Ambulatory Visit (INDEPENDENT_AMBULATORY_CARE_PROVIDER_SITE_OTHER): Payer: Medicare Other | Admitting: *Deleted

## 2014-03-02 DIAGNOSIS — Z5181 Encounter for therapeutic drug level monitoring: Secondary | ICD-10-CM

## 2014-03-02 DIAGNOSIS — I4891 Unspecified atrial fibrillation: Secondary | ICD-10-CM

## 2014-03-02 DIAGNOSIS — G459 Transient cerebral ischemic attack, unspecified: Secondary | ICD-10-CM

## 2014-03-02 DIAGNOSIS — I480 Paroxysmal atrial fibrillation: Secondary | ICD-10-CM

## 2014-03-02 LAB — POCT INR: INR: 2.7

## 2014-03-07 ENCOUNTER — Other Ambulatory Visit (INDEPENDENT_AMBULATORY_CARE_PROVIDER_SITE_OTHER): Payer: Self-pay | Admitting: Otolaryngology

## 2014-03-07 DIAGNOSIS — E079 Disorder of thyroid, unspecified: Secondary | ICD-10-CM

## 2014-03-15 ENCOUNTER — Ambulatory Visit (HOSPITAL_COMMUNITY)
Admission: RE | Admit: 2014-03-15 | Discharge: 2014-03-15 | Disposition: A | Discharge status: Home / Self Care | Payer: Medicare Other | Source: Ambulatory Visit | Attending: Otolaryngology | Admitting: Otolaryngology

## 2014-03-15 DIAGNOSIS — E079 Disorder of thyroid, unspecified: Secondary | ICD-10-CM | POA: Diagnosis not present

## 2014-03-29 ENCOUNTER — Ambulatory Visit (INDEPENDENT_AMBULATORY_CARE_PROVIDER_SITE_OTHER): Payer: Medicare Other | Admitting: *Deleted

## 2014-03-29 DIAGNOSIS — Z5181 Encounter for therapeutic drug level monitoring: Secondary | ICD-10-CM

## 2014-03-29 DIAGNOSIS — I480 Paroxysmal atrial fibrillation: Secondary | ICD-10-CM

## 2014-03-29 DIAGNOSIS — G459 Transient cerebral ischemic attack, unspecified: Secondary | ICD-10-CM

## 2014-03-29 LAB — POCT INR: INR: 1.8

## 2014-03-30 ENCOUNTER — Ambulatory Visit (INDEPENDENT_AMBULATORY_CARE_PROVIDER_SITE_OTHER): Payer: Medicare Other | Admitting: Otolaryngology

## 2014-04-07 ENCOUNTER — Other Ambulatory Visit: Payer: Self-pay | Admitting: Family Medicine

## 2014-04-12 ENCOUNTER — Telehealth: Payer: Self-pay | Admitting: Family Medicine

## 2014-04-12 NOTE — Telephone Encounter (Signed)
Pt given [facility] number to call for follow up of thyroid nodules

## 2014-04-18 ENCOUNTER — Other Ambulatory Visit: Payer: Self-pay | Admitting: Family Medicine

## 2014-04-19 ENCOUNTER — Ambulatory Visit (INDEPENDENT_AMBULATORY_CARE_PROVIDER_SITE_OTHER): Payer: Medicare Other | Admitting: *Deleted

## 2014-04-19 DIAGNOSIS — I480 Paroxysmal atrial fibrillation: Secondary | ICD-10-CM

## 2014-04-19 DIAGNOSIS — Z5181 Encounter for therapeutic drug level monitoring: Secondary | ICD-10-CM

## 2014-04-19 DIAGNOSIS — G459 Transient cerebral ischemic attack, unspecified: Secondary | ICD-10-CM

## 2014-04-19 LAB — POCT INR: INR: 2

## 2014-04-20 ENCOUNTER — Ambulatory Visit (INDEPENDENT_AMBULATORY_CARE_PROVIDER_SITE_OTHER): Payer: Medicare Other | Admitting: Otolaryngology

## 2014-04-20 DIAGNOSIS — E040 Nontoxic diffuse goiter: Secondary | ICD-10-CM

## 2014-04-29 ENCOUNTER — Other Ambulatory Visit: Payer: Self-pay | Admitting: Family Medicine

## 2014-05-01 ENCOUNTER — Telehealth: Payer: Self-pay | Admitting: Cardiology

## 2014-05-01 MED ORDER — WARFARIN SODIUM 5 MG PO TABS: ORAL_TABLET | ORAL | Status: DC

## 2014-05-01 NOTE — Telephone Encounter (Signed)
Received fax refill request  Rx # [id number] Medication:  Warfarin Sodium 5 mg tablet Qty 45 Sig:  Take one tablet by mouth every day except 1 and 1/2 on Monday and Thursday Physician:  Harl Bowie

## 2014-05-02 ENCOUNTER — Other Ambulatory Visit: Payer: Self-pay | Admitting: Family Medicine

## 2014-05-17 ENCOUNTER — Ambulatory Visit (INDEPENDENT_AMBULATORY_CARE_PROVIDER_SITE_OTHER): Payer: Medicare Other | Admitting: *Deleted

## 2014-05-17 DIAGNOSIS — Z5181 Encounter for therapeutic drug level monitoring: Secondary | ICD-10-CM

## 2014-05-17 DIAGNOSIS — I480 Paroxysmal atrial fibrillation: Secondary | ICD-10-CM

## 2014-05-17 DIAGNOSIS — G459 Transient cerebral ischemic attack, unspecified: Secondary | ICD-10-CM

## 2014-05-17 LAB — POCT INR: INR: 2.5

## 2014-05-24 ENCOUNTER — Other Ambulatory Visit: Payer: Self-pay | Admitting: Family Medicine

## 2014-05-27 ENCOUNTER — Other Ambulatory Visit: Payer: Self-pay | Admitting: Family Medicine

## 2014-05-30 ENCOUNTER — Ambulatory Visit (INDEPENDENT_AMBULATORY_CARE_PROVIDER_SITE_OTHER): Payer: Medicare Other | Admitting: Family Medicine

## 2014-05-30 ENCOUNTER — Other Ambulatory Visit (HOSPITAL_COMMUNITY)
Admission: RE | Admit: 2014-05-30 | Discharge: 2014-05-30 | Disposition: A | Discharge status: Home / Self Care | Payer: Medicare Other | Source: Ambulatory Visit | Attending: Family Medicine | Admitting: Family Medicine

## 2014-05-30 ENCOUNTER — Ambulatory Visit (INDEPENDENT_AMBULATORY_CARE_PROVIDER_SITE_OTHER): Payer: Medicare Other

## 2014-05-30 ENCOUNTER — Encounter: Payer: Self-pay | Admitting: Family Medicine

## 2014-05-30 VITALS — BP 130/82 | HR 68 | Resp 16 | Ht 62.0 in | Wt 194.8 lb

## 2014-05-30 DIAGNOSIS — Z23 Encounter for immunization: Secondary | ICD-10-CM

## 2014-05-30 DIAGNOSIS — Z124 Encounter for screening for malignant neoplasm of cervix: Secondary | ICD-10-CM | POA: Insufficient documentation

## 2014-05-30 DIAGNOSIS — E669 Obesity, unspecified: Secondary | ICD-10-CM

## 2014-05-30 DIAGNOSIS — E1169 Type 2 diabetes mellitus with other specified complication: Secondary | ICD-10-CM

## 2014-05-30 DIAGNOSIS — Z0000 Encounter for general adult medical examination without abnormal findings: Secondary | ICD-10-CM

## 2014-05-30 DIAGNOSIS — E785 Hyperlipidemia, unspecified: Secondary | ICD-10-CM

## 2014-05-30 DIAGNOSIS — Z1211 Encounter for screening for malignant neoplasm of colon: Secondary | ICD-10-CM

## 2014-05-30 DIAGNOSIS — I10 Essential (primary) hypertension: Secondary | ICD-10-CM

## 2014-05-30 LAB — CBC
HEMATOCRIT: 36.2 % (ref 36.0–46.0)
HEMOGLOBIN: 12 g/dL (ref 12.0–15.0)
MCH: 27.1 pg (ref 26.0–34.0)
MCHC: 33.1 g/dL (ref 30.0–36.0)
MCV: 81.7 fL (ref 78.0–100.0)
MPV: 10.2 fL (ref 9.4–12.4)
Platelets: 263 10*3/uL (ref 150–400)
RBC: 4.43 MIL/uL (ref 3.87–5.11)
RDW: 15.6 % — ABNORMAL HIGH (ref 11.5–15.5)
WBC: 4.7 10*3/uL (ref 4.0–10.5)

## 2014-05-30 LAB — COMPLETE METABOLIC PANEL WITH GFR
ALT: 15 U/L (ref 0–35)
AST: 22 U/L (ref 0–37)
Albumin: 3.9 g/dL (ref 3.5–5.2)
Alkaline Phosphatase: 80 U/L (ref 39–117)
BILIRUBIN TOTAL: 0.3 mg/dL (ref 0.2–1.2)
BUN: 13 mg/dL (ref 6–23)
CO2: 27 mEq/L (ref 19–32)
CREATININE: 0.57 mg/dL (ref 0.50–1.10)
Calcium: 9.3 mg/dL (ref 8.4–10.5)
Chloride: 105 mEq/L (ref 96–112)
GFR, Est African American: >89 mL/min
GLUCOSE: 84 mg/dL (ref 70–99)
Potassium: 4.6 mEq/L (ref 3.5–5.3)
Sodium: 140 mEq/L (ref 135–145)
Total Protein: 7.3 g/dL (ref 6.0–8.3)

## 2014-05-30 LAB — LIPID PANEL
Cholesterol: 165 mg/dL (ref 0–200)
HDL: 37 mg/dL — ABNORMAL LOW (ref >39)
LDL CALC: 111 mg/dL — AB (ref 0–99)
TRIGLYCERIDES: 84 mg/dL (ref <150)
Total CHOL/HDL Ratio: 4.5 Ratio
VLDL: 17 mg/dL (ref 0–40)

## 2014-05-30 LAB — POC HEMOCCULT BLD/STL (OFFICE/1-CARD/DIAGNOSTIC): FECAL OCCULT BLD: NEGATIVE

## 2014-05-30 NOTE — Assessment & Plan Note (Signed)
Vaccine administered at visit.  

## 2014-05-30 NOTE — Patient Instructions (Addendum)
Welcome to medicare in 4.5 month, call if you need me before  Microalb from office today  Foot exam qualifiers you for diabetic shoes, nurse will explain  CBc, fasting lipid, cmp and EGFR, hBA1C  And tSH  Today  Hba1C, chem 7 and EGFR non fast in 4.5 month  You are referred to Dr Gershon Crane  Flu vaccine today

## 2014-05-30 NOTE — Assessment & Plan Note (Signed)

## 2014-05-30 NOTE — Progress Notes (Signed)
   Subjective:    Patient ID: Samantha Vega, female    DOB: September 04, 1950, 63 y.o.   MRN: [id number]  HPI Patient is in for annual physicalexam. No other health concerns are expressed or addressed at the visit.    Review of Systems See HPI     Objective:   Physical Exam  BP 130/82 mmHg  Pulse 68  Resp 16  Ht 5\' 2"  (1.575 m)  Wt 194 lb 12.8 oz (88.361 kg)  BMI 35.62 kg/m2  SpO2 98% Pleasant well nourished female, alert and oriented x 3, in no cardio-pulmonary distress. Afebrile. HEENT No facial trauma or asymetry. Sinuses non tender.  Extra occullar muscles intact, pupils equally reactive to light. External ears normal, tympanic membranes clear. Oropharynx moist, no exudate, poordentition. Neck: supple, no adenopathy,JVD or thyromegaly.No bruits.  Chest: Clear to ascultation bilaterally.No crackles or wheezes. Non tender to palpation  Breast: No asymetry,no masses or lumps. No tenderness. No nipple discharge or inversion. No axillary or supraclavicular adenopathy  Cardiovascular system; Heart sounds normal,  S1 and  S2 ,no S3.  No murmur, or thrill. Apical beat not displaced Peripheral pulses normal.  Abdomen: Soft, non tender, no organomegaly or masses. No bruits. Bowel sounds normal. No guarding, tenderness or rebound.  Rectal:  Normal sphincter tone. No mass.No rectal masses.  Guaiac negative stool.  GU: External genitalia normal female genitalia , female distribution of hair. No lesions. Urethral meatus normal in size, no  Prolapse, no lesions visibly  Present. Bladder non tender. Vagina pink and moist , with no visible lesions ,physiologic  discharge present . Adequate pelvic support no  cystocele or rectocele noted Uterus absent, no adnexal masses, no  adnexal tenderness.   Musculoskeletal exam: Decreased though adequate ROM of spine, hips , shoulders and knees. No deformity ,swelling or crepitus noted. No muscle wasting or atrophy.    Neurologic: Cranial nerves 2 to 12 intact. Power, tone ,sensation and reflexes normal throughout. No disturbance in gait. No tremor.  Skin: Intact, no ulceration, erythema , scaling or rash noted. Pigmentation normal throughout  Psych; Normal mood and affect. Judgement and concentration normal       Assessment & Plan:

## 2014-05-31 ENCOUNTER — Telehealth: Payer: Self-pay | Admitting: Family Medicine

## 2014-05-31 LAB — HEMOGLOBIN A1C
Hgb A1c MFr Bld: 5.9 % — ABNORMAL HIGH (ref <5.7)
Mean Plasma Glucose: 123 mg/dL — ABNORMAL HIGH (ref <117)

## 2014-05-31 LAB — TSH: TSH: 0.419 u[IU]/mL (ref 0.350–4.500)

## 2014-05-31 LAB — MICROALBUMIN / CREATININE URINE RATIO
Creatinine, Urine: 227.5 mg/dL
Microalb Creat Ratio: 12.3 mg/g (ref 0.0–30.0)
Microalb, Ur: 2.8 mg/dL — ABNORMAL HIGH (ref <2.0)

## 2014-05-31 NOTE — Telephone Encounter (Signed)
Quantity added to rx and faxed and verbally given to pharmacy

## 2014-06-01 LAB — CYTOLOGY - PAP

## 2014-06-01 MED ORDER — ATORVASTATIN CALCIUM 40 MG PO TABS: 40.0000 mg | ORAL_TABLET | Freq: Every day | ORAL | Status: DC

## 2014-06-14 ENCOUNTER — Encounter: Payer: Self-pay | Admitting: *Deleted

## 2014-06-15 ENCOUNTER — Telehealth: Payer: Self-pay | Admitting: *Deleted

## 2014-06-15 NOTE — Telephone Encounter (Signed)
Noted and forms completed and awaiting signature.

## 2014-06-15 NOTE — Telephone Encounter (Signed)
Pt called stating she needs a RX for diabetic shoes to [facility]. Please advise

## 2014-06-27 ENCOUNTER — Other Ambulatory Visit: Payer: Self-pay | Admitting: Family Medicine

## 2014-06-28 ENCOUNTER — Other Ambulatory Visit: Payer: Self-pay

## 2014-06-28 ENCOUNTER — Ambulatory Visit (INDEPENDENT_AMBULATORY_CARE_PROVIDER_SITE_OTHER): Payer: Medicare Other | Admitting: *Deleted

## 2014-06-28 DIAGNOSIS — Z5181 Encounter for therapeutic drug level monitoring: Secondary | ICD-10-CM | POA: Diagnosis not present

## 2014-06-28 DIAGNOSIS — I480 Paroxysmal atrial fibrillation: Secondary | ICD-10-CM | POA: Diagnosis not present

## 2014-06-28 DIAGNOSIS — G459 Transient cerebral ischemic attack, unspecified: Secondary | ICD-10-CM

## 2014-06-28 LAB — POCT INR: INR: 1.2

## 2014-06-28 MED ORDER — GLUCOSE BLOOD VI STRP: ORAL_STRIP | Status: DC

## 2014-07-12 ENCOUNTER — Ambulatory Visit (INDEPENDENT_AMBULATORY_CARE_PROVIDER_SITE_OTHER): Payer: Medicare Other | Admitting: *Deleted

## 2014-07-12 DIAGNOSIS — I480 Paroxysmal atrial fibrillation: Secondary | ICD-10-CM | POA: Diagnosis not present

## 2014-07-12 DIAGNOSIS — G459 Transient cerebral ischemic attack, unspecified: Secondary | ICD-10-CM | POA: Diagnosis not present

## 2014-07-12 DIAGNOSIS — Z5181 Encounter for therapeutic drug level monitoring: Secondary | ICD-10-CM | POA: Diagnosis not present

## 2014-07-12 LAB — POCT INR: INR: 1.1

## 2014-07-17 ENCOUNTER — Ambulatory Visit (INDEPENDENT_AMBULATORY_CARE_PROVIDER_SITE_OTHER): Payer: Medicare Other | Admitting: *Deleted

## 2014-07-17 DIAGNOSIS — G459 Transient cerebral ischemic attack, unspecified: Secondary | ICD-10-CM | POA: Diagnosis not present

## 2014-07-17 DIAGNOSIS — I480 Paroxysmal atrial fibrillation: Secondary | ICD-10-CM | POA: Diagnosis not present

## 2014-07-17 DIAGNOSIS — Z5181 Encounter for therapeutic drug level monitoring: Secondary | ICD-10-CM

## 2014-07-17 LAB — POCT INR: INR: 1.8

## 2014-07-20 ENCOUNTER — Other Ambulatory Visit: Payer: Self-pay | Admitting: Family Medicine

## 2014-07-25 ENCOUNTER — Telehealth: Payer: Self-pay

## 2014-07-25 DIAGNOSIS — E1169 Type 2 diabetes mellitus with other specified complication: Secondary | ICD-10-CM

## 2014-07-25 DIAGNOSIS — E669 Obesity, unspecified: Principal | ICD-10-CM

## 2014-07-25 NOTE — Telephone Encounter (Signed)
Referral entered  

## 2014-07-27 DIAGNOSIS — E669 Obesity, unspecified: Secondary | ICD-10-CM | POA: Diagnosis not present

## 2014-07-27 DIAGNOSIS — E119 Type 2 diabetes mellitus without complications: Secondary | ICD-10-CM | POA: Diagnosis not present

## 2014-07-28 DIAGNOSIS — E119 Type 2 diabetes mellitus without complications: Secondary | ICD-10-CM | POA: Diagnosis not present

## 2014-07-28 DIAGNOSIS — M2140 Flat foot [pes planus] (acquired), unspecified foot: Secondary | ICD-10-CM | POA: Diagnosis not present

## 2014-07-28 LAB — COMPLETE METABOLIC PANEL WITH GFR
ALK PHOS: 93 U/L (ref 39–117)
ALT: 17 U/L (ref 0–35)
AST: 18 U/L (ref 0–37)
Albumin: 3.8 g/dL (ref 3.5–5.2)
BILIRUBIN TOTAL: 0.4 mg/dL (ref 0.2–1.2)
BUN: 12 mg/dL (ref 6–23)
CO2: 29 meq/L (ref 19–32)
Calcium: 9.2 mg/dL (ref 8.4–10.5)
Chloride: 104 mEq/L (ref 96–112)
Creat: 0.57 mg/dL (ref 0.50–1.10)
GFR, Est Non African American: >89 mL/min
Glucose, Bld: 87 mg/dL (ref 70–99)
Potassium: 4.3 mEq/L (ref 3.5–5.3)
SODIUM: 140 meq/L (ref 135–145)
Total Protein: 7 g/dL (ref 6.0–8.3)

## 2014-07-28 LAB — HEMOGLOBIN A1C
Hgb A1c MFr Bld: 6 % — ABNORMAL HIGH (ref <5.7)
MEAN PLASMA GLUCOSE: 126 mg/dL — AB (ref <117)

## 2014-08-02 ENCOUNTER — Ambulatory Visit (INDEPENDENT_AMBULATORY_CARE_PROVIDER_SITE_OTHER): Payer: Medicare Other | Admitting: *Deleted

## 2014-08-02 DIAGNOSIS — Z5181 Encounter for therapeutic drug level monitoring: Secondary | ICD-10-CM | POA: Diagnosis not present

## 2014-08-02 DIAGNOSIS — I480 Paroxysmal atrial fibrillation: Secondary | ICD-10-CM

## 2014-08-02 DIAGNOSIS — G459 Transient cerebral ischemic attack, unspecified: Secondary | ICD-10-CM | POA: Diagnosis not present

## 2014-08-02 LAB — POCT INR: INR: 2.1

## 2014-08-03 DIAGNOSIS — M79675 Pain in left toe(s): Secondary | ICD-10-CM | POA: Diagnosis not present

## 2014-08-03 DIAGNOSIS — L600 Ingrowing nail: Secondary | ICD-10-CM | POA: Diagnosis not present

## 2014-08-03 DIAGNOSIS — L03032 Cellulitis of left toe: Secondary | ICD-10-CM | POA: Diagnosis not present

## 2014-08-14 ENCOUNTER — Other Ambulatory Visit: Payer: Self-pay | Admitting: Family Medicine

## 2014-08-14 ENCOUNTER — Telehealth: Payer: Self-pay | Admitting: Family Medicine

## 2014-08-14 MED ORDER — GLUCOSE BLOOD VI STRP: ORAL_STRIP | Status: DC

## 2014-08-14 NOTE — Telephone Encounter (Signed)
meds have been refilled  

## 2014-08-23 ENCOUNTER — Ambulatory Visit (INDEPENDENT_AMBULATORY_CARE_PROVIDER_SITE_OTHER): Payer: Medicare Other | Admitting: Cardiology

## 2014-08-23 ENCOUNTER — Encounter: Payer: Self-pay | Admitting: Cardiology

## 2014-08-23 ENCOUNTER — Ambulatory Visit (INDEPENDENT_AMBULATORY_CARE_PROVIDER_SITE_OTHER): Payer: Medicare Other | Admitting: *Deleted

## 2014-08-23 VITALS — BP 142/94 | HR 89 | Ht 62.0 in | Wt 193.6 lb

## 2014-08-23 DIAGNOSIS — I10 Essential (primary) hypertension: Secondary | ICD-10-CM

## 2014-08-23 DIAGNOSIS — I480 Paroxysmal atrial fibrillation: Secondary | ICD-10-CM | POA: Diagnosis not present

## 2014-08-23 DIAGNOSIS — E785 Hyperlipidemia, unspecified: Secondary | ICD-10-CM | POA: Diagnosis not present

## 2014-08-23 DIAGNOSIS — G459 Transient cerebral ischemic attack, unspecified: Secondary | ICD-10-CM

## 2014-08-23 DIAGNOSIS — Z5181 Encounter for therapeutic drug level monitoring: Secondary | ICD-10-CM | POA: Diagnosis not present

## 2014-08-23 LAB — POCT INR: INR: 4.1

## 2014-08-23 NOTE — Progress Notes (Signed)
Clinical Summary Ms. Balles is a 64 y.o.female seen today for follow up of the following medical problems.   1. Paroxysmal afib:  - rate control strategy w/ diltiazem and metoprolol  - denies any significant palpitations. On coumadin, denies any bleeding issues - previously on xarelto however cost was too much, changed to coumadin.   2. HTN  - does not check regularly  -compliant w/ meds, but has not taken yet today.    3. HL  - 05/2014: TC 165 TG 84 HDL 37 LDL 111 - she is unsure what statin she is currently taking. From pcp notes she was to change to atorva 40mg  daily.   Past Medical History  Diagnosis Date  . Right bundle Christyana Corwin block     +Palpitations.   EKG 11/2006:  NSR, RBBB, LAFB, markedly delayed R-wave progression, no change; Echocardiogram in 10/2006-suboptimal quality, normal; Event recorder-PVCs, no significant arrhythmias or symptoms ;  . Obesity   . Hyperlipidemia   . Hypertension   . Diabetes mellitus   . Chest pain     Noncardiac; evaluated and treated in 2008  . Enlarged heart   . Arthritis      No Known Allergies   Current Outpatient Prescriptions  Medication Sig Dispense Refill  . aspirin EC 81 MG tablet Take 81 mg by mouth daily.    Marland Kitchen atorvastatin (LIPITOR) 40 MG tablet TAKE 1 TABLET EVERY DAY 30 tablet 3  . benazepril (LOTENSIN) 20 MG tablet TAKE 1 TABLET (20 MG TOTAL) BY MOUTH DAILY. 30 tablet 2  . calcium-vitamin D (OSCAL 500/200 D-3) 500 MG tablet Take 1 tablet by mouth 2 (two) times daily.     . diclofenac sodium (VOLTAREN) 1 % GEL Apply 4 g topically 4 (four) times daily. 5 Tube 3  . diltiazem (CARDIZEM CD) 120 MG 24 hr capsule TAKE 1 CAPSULE (120 MG TOTAL) BY MOUTH DAILY. 30 capsule 3  . diltiazem (CARDIZEM CD) 120 MG 24 hr capsule TAKE 1 CAPSULE (120 MG TOTAL) BY MOUTH DAILY. 30 capsule 2  . glucose blood (ONE TOUCH ULTRA TEST) test strip USE ONCE DAILY dx E11.9 50 each 5  . metFORMIN (GLUCOPHAGE) 500 MG tablet TAKE 1 TABLET BY  MOUTH 2 TIMES DAILY WITH A MEAL. 180 tablet 1  . metoprolol (LOPRESSOR) 100 MG tablet Take 1 tablet (100 mg total) by mouth 2 (two) times daily. 60 tablet 8  . pravastatin (PRAVACHOL) 80 MG tablet TAKE 1 TABLET BY MOUTH EVERY DAY 30 tablet 5  . traMADol (ULTRAM) 50 MG tablet TAKE 1 TABLET EVERY DAY AS NEEDED 30 tablet 1  . warfarin (COUMADIN) 5 MG tablet Take 1 tablet daily except 1 1/2 tablets on Mondays, Wednesdays and Fridays 45 tablet 3   No current facility-administered medications for this visit.     Past Surgical History  Procedure Laterality Date  . Total abdominal hysterectomy w/ bilateral salpingoophorectomy    . Shoulder surgery  2006    Left-post-trauma  . Abdominal hysterectomy       No Known Allergies    Family History  Problem Relation Age of Onset  . Hypertension Mother     and sister  . Arthritis Mother   . Stroke Father   . Hypertension Father   . Diabetes Sister     x1  . Sarcoidosis Sister     x1  . Heart failure Brother   . Coronary artery disease Father      Social History Ms. Mancel Bale  reports that she has never smoked. She has never used smokeless tobacco. Ms. Schreurs reports that she does not drink alcohol.   Review of Systems CONSTITUTIONAL: No weight loss, fever, chills, weakness or fatigue.  HEENT: Eyes: No visual loss, blurred vision, double vision or yellow sclerae.No hearing loss, sneezing, congestion, runny nose or sore throat.  SKIN: No rash or itching.  CARDIOVASCULAR: per HPI RESPIRATORY: No shortness of breath, cough or sputum.  GASTROINTESTINAL: No anorexia, nausea, vomiting or diarrhea. No abdominal pain or blood.  GENITOURINARY: No burning on urination, no polyuria NEUROLOGICAL: No headache, dizziness, syncope, paralysis, ataxia, numbness or tingling in the extremities. No change in bowel or bladder control.  MUSCULOSKELETAL: No muscle, back pain, joint pain or stiffness.  LYMPHATICS: No enlarged nodes. No history of  splenectomy.  PSYCHIATRIC: No history of depression or anxiety.  ENDOCRINOLOGIC: No reports of sweating, cold or heat intolerance. No polyuria or polydipsia.  Marland Kitchen   Physical Examination p 89 bp 142/94 Wt 193 lbs BMI 35 Gen: resting comfortably, no acute distress HEENT: no scleral icterus, pupils equal round and reactive, no palptable cervical adenopathy,  CV: RRR, no m/r/g, no JVD, no carotid bruits Resp: Clear to auscultation bilaterally GI: abdomen is soft, non-tender, non-distended, normal bowel sounds, no hepatosplenomegaly MSK: extremities are warm, no edema.  Skin: warm, no rash Neuro:  no focal deficits Psych: appropriate affect   Diagnostic Studies 07/12/12 Echo: LVEF 55-60%, mild basal septal hypertrophy,   07/11/12 Carotid US: <50% bilateral stenosis, 3.4 cm thyroid nodule   03/07/13 EKG: SR, RBBB, LAFB, LAE, non-spec ST/T changes    Assessment and Plan  1. Parox afib  - rare symptoms, will continue current rate control strategy.  - continue coumadin.   2. HTN: - at goal, continue current meds  3. HL: - patient asked to varify at home she is taking atorva 40mg  daily and no longer on pravastatin.    F/u 1 year   Arnoldo Lenis, M.D.

## 2014-08-23 NOTE — Patient Instructions (Addendum)
Your physician wants you to follow-up in: 1 year with Dr Bryna Colander will receive a reminder letter in the mail two months in advance. If you don't receive a letter, please call our office to schedule the follow-up appointment.   Stop taking Pravachol, STAY ON LIPITOR (Atorvastatin)   Thank you for choosing [facility] !

## 2014-08-29 DIAGNOSIS — H52203 Unspecified astigmatism, bilateral: Secondary | ICD-10-CM | POA: Diagnosis not present

## 2014-08-29 DIAGNOSIS — H524 Presbyopia: Secondary | ICD-10-CM | POA: Diagnosis not present

## 2014-08-29 DIAGNOSIS — H2513 Age-related nuclear cataract, bilateral: Secondary | ICD-10-CM | POA: Diagnosis not present

## 2014-08-29 DIAGNOSIS — E119 Type 2 diabetes mellitus without complications: Secondary | ICD-10-CM | POA: Diagnosis not present

## 2014-08-29 LAB — HM DIABETES EYE EXAM

## 2014-09-06 ENCOUNTER — Encounter: Payer: Self-pay | Admitting: Family Medicine

## 2014-09-06 ENCOUNTER — Ambulatory Visit (INDEPENDENT_AMBULATORY_CARE_PROVIDER_SITE_OTHER): Payer: Medicare Other | Admitting: Family Medicine

## 2014-09-06 ENCOUNTER — Ambulatory Visit (INDEPENDENT_AMBULATORY_CARE_PROVIDER_SITE_OTHER): Payer: Medicare Other | Admitting: *Deleted

## 2014-09-06 VITALS — BP 134/84 | HR 76 | Resp 18 | Ht 62.0 in | Wt 191.1 lb

## 2014-09-06 DIAGNOSIS — G459 Transient cerebral ischemic attack, unspecified: Secondary | ICD-10-CM

## 2014-09-06 DIAGNOSIS — Z23 Encounter for immunization: Secondary | ICD-10-CM | POA: Diagnosis not present

## 2014-09-06 DIAGNOSIS — I480 Paroxysmal atrial fibrillation: Secondary | ICD-10-CM

## 2014-09-06 DIAGNOSIS — E785 Hyperlipidemia, unspecified: Secondary | ICD-10-CM

## 2014-09-06 DIAGNOSIS — E669 Obesity, unspecified: Secondary | ICD-10-CM

## 2014-09-06 DIAGNOSIS — Z5181 Encounter for therapeutic drug level monitoring: Secondary | ICD-10-CM | POA: Diagnosis not present

## 2014-09-06 DIAGNOSIS — M129 Arthropathy, unspecified: Secondary | ICD-10-CM

## 2014-09-06 DIAGNOSIS — Z0000 Encounter for general adult medical examination without abnormal findings: Secondary | ICD-10-CM | POA: Diagnosis not present

## 2014-09-06 DIAGNOSIS — M1710 Unilateral primary osteoarthritis, unspecified knee: Secondary | ICD-10-CM

## 2014-09-06 DIAGNOSIS — E119 Type 2 diabetes mellitus without complications: Secondary | ICD-10-CM

## 2014-09-06 DIAGNOSIS — E1169 Type 2 diabetes mellitus with other specified complication: Secondary | ICD-10-CM

## 2014-09-06 LAB — POCT INR: INR: 2.2

## 2014-09-06 MED ORDER — DICLOFENAC SODIUM 1 % TD GEL: 4.0000 g | Freq: Four times a day (QID) | TRANSDERMAL | Status: DC

## 2014-09-06 NOTE — Patient Instructions (Signed)
F/u in 4 month, call if you need me please before this  Start regular exercise , [employer] [employer], and commit to healthy eating for improved health and weight loss  Prevnar today  Fasting lipid, cmp and EGFR, microalb and HBA1C in 4 month.  Thanks for choosing [facility], we consider it a privelige to serve you.

## 2014-09-06 NOTE — Assessment & Plan Note (Signed)
After obtaining informed consent, the vaccine is  administered by LPN.  

## 2014-09-06 NOTE — Progress Notes (Signed)
Subjective:    Patient ID: Samantha Vega, female    DOB: 08-15-1950, 64 y.o.   MRN: [id number]  HPI Preventive Screening-Counseling & Management   Patient present here today for Welcome to medicare exam  Current Problems (verified)   Medications Prior to Visit Allergies (verified)   PAST HISTORY  Family History (updated)  Social History Retired Training and development officer, married x 14 years   Risk Factors  Current exercise habits:  Will increase exercise as able to 3x weekly for 30 minute increments  Current exercise is 1 hr per week Dietary issues discussed: heart healthy low fat diet   Cardiac risk factors: htn  Depression Screen  (Note: if answer to either of the following is "Yes", a more complete depression screening is indicated)   Over the past two weeks, have you felt down, depressed or hopeless? No  Over the past two weeks, have you felt little interest or pleasure in doing things? No  Have you lost interest or pleasure in daily life? No  Do you often feel hopeless? No  Do you cry easily over simple problems? No   Activities of Daily Living  In your present state of health, do you have any difficulty performing the following activities?  Driving?: No Managing money?: No Feeding yourself?:No Getting from bed to chair?:No Climbing a flight of stairs?:No Preparing food and eating?:No Bathing or showering?:No Getting dressed?:No Getting to the toilet?:No Using the toilet?:No Moving around from place to place?: No  Fall Risk Assessment In the past year have you fallen or had a near fall?:No Are you currently taking any medications that make you dizzy?:No   Hearing Difficulties: No Do you often ask people to speak up or repeat themselves?:No Do you experience ringing or noises in your ears?:No Do you have difficulty understanding soft or whispered voices?:No  Cognitive Testing  Alert? Yes Normal Appearance?Yes  Oriented to person? Yes Place? Yes  Time? Yes    Displays appropriate judgment?Yes  Can read the correct time from a watch face? yes Are you having problems remembering things?No  Advanced Directives have been discussed with the patient?Yes; brochure discussed and given, full code   List the Names of Other Physician/Practitioners you currently use: updated in Care Teams    Indicate any recent Medical Services you may have received from other than [facility] providers in the past year (date may be approximate).   Assessment:    Welcome to medicare exam Plan:    Medicare Attestation  I have personally reviewed:  The patient's medical and social history  Their use of alcohol, tobacco or illicit drugs  Their current medications and supplements  The patient's functional ability including ADLs,fall risks, home safety risks, cognitive, and hearing and visual impairment  Diet and physical activities  Evidence for depression or mood disorders  The patient's weight, height, BMI, and visual acuity have been recorded in the chart. I have made referrals, counseling, and provided education to the patient based on review of the above and I have provided the patient with a written personalized care plan for preventive services.      Review of Systems     Objective:   Physical Exam  BP 134/84 mmHg  Pulse 76  Resp 18  Ht 5\' 2"  (1.575 m)  Wt 191 lb 1.3 oz (86.673 kg)  BMI 34.94 kg/m2  SpO2 95%       Assessment & Plan:  Need for vaccination with 13-polyvalent pneumococcal conjugate vaccine After obtaining informed consent, the  vaccine is  administered by LPN.    Welcome to Medicare preventive visit Annual exam as documented. Counseling done  re healthy lifestyle involving commitment to 150 minutes exercise per week, heart healthy diet, and attaining healthy weight.The importance of adequate sleep also discussed. Regular seat belt use and home safety, is also discussed. Changes in health habits are decided on by the patient with  goals and time frames  set for achieving them. Immunization and cancer screening needs are specifically addressed at this visit.

## 2014-09-11 ENCOUNTER — Other Ambulatory Visit: Payer: Self-pay | Admitting: Family Medicine

## 2014-09-11 DIAGNOSIS — Z1231 Encounter for screening mammogram for malignant neoplasm of breast: Secondary | ICD-10-CM

## 2014-09-17 NOTE — Assessment & Plan Note (Signed)

## 2014-09-20 ENCOUNTER — Ambulatory Visit (HOSPITAL_COMMUNITY)
Admission: RE | Admit: 2014-09-20 | Discharge: 2014-09-20 | Disposition: A | Discharge status: Home / Self Care | Payer: Medicare Other | Source: Ambulatory Visit | Attending: Family Medicine | Admitting: Family Medicine

## 2014-09-20 DIAGNOSIS — Z1231 Encounter for screening mammogram for malignant neoplasm of breast: Secondary | ICD-10-CM | POA: Diagnosis not present

## 2014-09-27 ENCOUNTER — Ambulatory Visit (INDEPENDENT_AMBULATORY_CARE_PROVIDER_SITE_OTHER): Payer: Medicare Other | Admitting: *Deleted

## 2014-09-27 DIAGNOSIS — Z5181 Encounter for therapeutic drug level monitoring: Secondary | ICD-10-CM

## 2014-09-27 DIAGNOSIS — I480 Paroxysmal atrial fibrillation: Secondary | ICD-10-CM

## 2014-09-27 DIAGNOSIS — G459 Transient cerebral ischemic attack, unspecified: Secondary | ICD-10-CM

## 2014-09-27 LAB — POCT INR: INR: 2.5

## 2014-10-19 ENCOUNTER — Other Ambulatory Visit: Payer: Self-pay | Admitting: Family Medicine

## 2014-10-30 ENCOUNTER — Ambulatory Visit (INDEPENDENT_AMBULATORY_CARE_PROVIDER_SITE_OTHER): Payer: Medicare Other | Admitting: *Deleted

## 2014-10-30 DIAGNOSIS — I480 Paroxysmal atrial fibrillation: Secondary | ICD-10-CM | POA: Diagnosis not present

## 2014-10-30 DIAGNOSIS — G459 Transient cerebral ischemic attack, unspecified: Secondary | ICD-10-CM

## 2014-10-30 DIAGNOSIS — Z5181 Encounter for therapeutic drug level monitoring: Secondary | ICD-10-CM

## 2014-10-30 LAB — POCT INR: INR: 2.5

## 2014-11-20 ENCOUNTER — Other Ambulatory Visit: Payer: Self-pay

## 2014-11-20 MED ORDER — WARFARIN SODIUM 5 MG PO TABS: ORAL_TABLET | ORAL | Status: DC

## 2014-11-22 ENCOUNTER — Other Ambulatory Visit: Payer: Self-pay | Admitting: Family Medicine

## 2014-12-01 ENCOUNTER — Other Ambulatory Visit: Payer: Self-pay

## 2014-12-01 MED ORDER — ATORVASTATIN CALCIUM 40 MG PO TABS: 40.0000 mg | ORAL_TABLET | Freq: Every day | ORAL | Status: DC

## 2014-12-01 MED ORDER — DILTIAZEM HCL ER COATED BEADS 120 MG PO CP24: ORAL_CAPSULE | ORAL | Status: DC

## 2014-12-11 ENCOUNTER — Ambulatory Visit (INDEPENDENT_AMBULATORY_CARE_PROVIDER_SITE_OTHER): Payer: Medicare Other | Admitting: *Deleted

## 2014-12-11 DIAGNOSIS — Z5181 Encounter for therapeutic drug level monitoring: Secondary | ICD-10-CM | POA: Diagnosis not present

## 2014-12-11 DIAGNOSIS — I480 Paroxysmal atrial fibrillation: Secondary | ICD-10-CM

## 2014-12-11 DIAGNOSIS — G459 Transient cerebral ischemic attack, unspecified: Secondary | ICD-10-CM | POA: Diagnosis not present

## 2014-12-11 LAB — POCT INR: INR: 2.1

## 2014-12-18 ENCOUNTER — Other Ambulatory Visit: Payer: Self-pay | Admitting: Family Medicine

## 2014-12-27 ENCOUNTER — Other Ambulatory Visit: Payer: Self-pay | Admitting: Family Medicine

## 2014-12-27 DIAGNOSIS — E669 Obesity, unspecified: Secondary | ICD-10-CM | POA: Diagnosis not present

## 2014-12-27 DIAGNOSIS — E785 Hyperlipidemia, unspecified: Secondary | ICD-10-CM | POA: Diagnosis not present

## 2014-12-27 DIAGNOSIS — E119 Type 2 diabetes mellitus without complications: Secondary | ICD-10-CM | POA: Diagnosis not present

## 2014-12-27 LAB — LIPID PANEL
Cholesterol: 138 mg/dL (ref 0–200)
HDL: 34 mg/dL — ABNORMAL LOW (ref >=46)
LDL CALC: 87 mg/dL (ref 0–99)
Total CHOL/HDL Ratio: 4.1 Ratio
Triglycerides: 86 mg/dL (ref <150)
VLDL: 17 mg/dL (ref 0–40)

## 2014-12-27 LAB — COMPLETE METABOLIC PANEL WITH GFR
ALT: 17 U/L (ref 0–35)
AST: 19 U/L (ref 0–37)
Albumin: 3.7 g/dL (ref 3.5–5.2)
Alkaline Phosphatase: 87 U/L (ref 39–117)
BUN: 15 mg/dL (ref 6–23)
CALCIUM: 9.1 mg/dL (ref 8.4–10.5)
CHLORIDE: 102 meq/L (ref 96–112)
CO2: 29 mEq/L (ref 19–32)
Creat: 0.67 mg/dL (ref 0.50–1.10)
GFR, Est African American: >89 mL/min
GLUCOSE: 82 mg/dL (ref 70–99)
POTASSIUM: 4.6 meq/L (ref 3.5–5.3)
Sodium: 138 mEq/L (ref 135–145)
TOTAL PROTEIN: 6.7 g/dL (ref 6.0–8.3)
Total Bilirubin: 0.3 mg/dL (ref 0.2–1.2)

## 2014-12-27 LAB — HEMOGLOBIN A1C
Hgb A1c MFr Bld: 5.9 % — ABNORMAL HIGH (ref <5.7)
Mean Plasma Glucose: 123 mg/dL — ABNORMAL HIGH (ref <117)

## 2014-12-28 LAB — MICROALBUMIN / CREATININE URINE RATIO
Creatinine, Urine: 171.6 mg/dL
Microalb Creat Ratio: 3.5 mg/g (ref 0.0–30.0)
Microalb, Ur: 0.6 mg/dL (ref <2.0)

## 2015-01-04 ENCOUNTER — Encounter: Payer: Self-pay | Admitting: Family Medicine

## 2015-01-04 ENCOUNTER — Ambulatory Visit (INDEPENDENT_AMBULATORY_CARE_PROVIDER_SITE_OTHER): Payer: Medicare Other | Admitting: Family Medicine

## 2015-01-04 VITALS — BP 118/72 | HR 84 | Resp 16 | Ht 62.0 in | Wt 189.0 lb

## 2015-01-04 DIAGNOSIS — G458 Other transient cerebral ischemic attacks and related syndromes: Secondary | ICD-10-CM

## 2015-01-04 DIAGNOSIS — E669 Obesity, unspecified: Secondary | ICD-10-CM

## 2015-01-04 DIAGNOSIS — E8881 Metabolic syndrome: Secondary | ICD-10-CM | POA: Diagnosis not present

## 2015-01-04 DIAGNOSIS — E119 Type 2 diabetes mellitus without complications: Secondary | ICD-10-CM | POA: Diagnosis not present

## 2015-01-04 DIAGNOSIS — I10 Essential (primary) hypertension: Secondary | ICD-10-CM | POA: Diagnosis not present

## 2015-01-04 DIAGNOSIS — E785 Hyperlipidemia, unspecified: Secondary | ICD-10-CM | POA: Diagnosis not present

## 2015-01-04 DIAGNOSIS — I480 Paroxysmal atrial fibrillation: Secondary | ICD-10-CM

## 2015-01-04 DIAGNOSIS — E1169 Type 2 diabetes mellitus with other specified complication: Secondary | ICD-10-CM

## 2015-01-04 MED ORDER — ATORVASTATIN CALCIUM 40 MG PO TABS: 40.0000 mg | ORAL_TABLET | Freq: Every day | ORAL | Status: DC

## 2015-01-04 MED ORDER — BENAZEPRIL HCL 20 MG PO TABS: ORAL_TABLET | ORAL | Status: DC

## 2015-01-04 MED ORDER — DILTIAZEM HCL ER COATED BEADS 120 MG PO CP24: ORAL_CAPSULE | ORAL | Status: DC

## 2015-01-04 MED ORDER — METFORMIN HCL 500 MG PO TABS: 500.0000 mg | ORAL_TABLET | Freq: Two times a day (BID) | ORAL | Status: DC

## 2015-01-04 NOTE — Patient Instructions (Signed)
Annual physical exam Dec 18or after, call and come for Flu vaccine in September  CONGRATS on excellent health habits and improved health, please keep it up!  Fasting lipid, cmp and EGFr, hBA1c and cBc Dec 15  Or after  Please work on good  health habits so that your health will improve. 1. Commitment to daily physical activity for 30 to 60  minutes, if you are able to do this.  2. Commitment to wise food choices. Aim for half of your  food intake to be vegetable and fruit, one quarter starchy foods, and one quarter protein. Try to eat on a regular schedule  3 meals per day, snacking between meals should be limited to vegetables or fruits or small portions of nuts. 64 ounces of water per day is generally recommended, unless you have specific health conditions, like heart failure or kidney failure where you will need to limit fluid intake.  3. Commitment to sufficient and a  good quality of physical and mental rest daily, generally between 6 to 8 hours per day.  WITH PERSISTANCE AND PERSEVERANCE, THE IMPOSSIBLE , BECOMES THE NORM!   Thanks for choosing [facility], we consider it a privelige to serve you. CONGRATS

## 2015-01-05 DIAGNOSIS — E8881 Metabolic syndrome: Secondary | ICD-10-CM | POA: Insufficient documentation

## 2015-01-05 NOTE — Assessment & Plan Note (Signed)
Hyperlipidemia:Low fat diet discussed and encouraged.   Lipid Panel  Lab Results  Component Value Date   CHOL 138 12/27/2014   HDL 34* 12/27/2014   LDLCALC 87 12/27/2014   TRIG 86 12/27/2014   CHOLHDL 4.1 12/27/2014   Needs to commit to daily physical activity

## 2015-01-05 NOTE — Assessment & Plan Note (Signed)
Improved. Patient re-educated about  the importance of commitment to a  minimum of 150 minutes of exercise per week.  The importance of healthy food choices with portion control discussed. Encouraged to start a food diary, count calories and to consider  joining a support group. Sample diet sheets offered. Goals set by the patient for the next several months.   Weight /BMI 01/04/2015 09/06/2014 08/23/2014  WEIGHT 189 lb 191 lb 1.3 oz 193 lb 9.6 oz  HEIGHT 5\' 2"  5\' 2"  5\' 2"   BMI 34.56 kg/m2 34.94 kg/m2 35.4 kg/m2    Current exercise per week 150 minutes.

## 2015-01-05 NOTE — Assessment & Plan Note (Signed)
The increased risk of cardiovascular disease associated with this diagnosis, and the need to consistently work on lifestyle to change this is discussed. Following  a  heart healthy diet ,commitment to 30 minutes of exercise at least 5 days per week, as well as control of blood sugar and cholesterol , and achieving a healthy weight are all the areas to be addressed .  

## 2015-01-05 NOTE — Assessment & Plan Note (Signed)
Asymptomatic, denies any light headedness or any neurologic deficit

## 2015-01-05 NOTE — Progress Notes (Signed)
Samantha Vega     MRN: [id number]      DOB: March 22, 1951   HPI Samantha Vega is here for follow up and re-evaluation of chronic medical conditions, medication management and review of any available recent lab and radiology data.  Preventive health is updated, specifically  Cancer screening and Immunization.   Questions or concerns regarding consultations or procedures which the PT has had in the interim are  addressed. The PT denies any adverse reactions to current medications since the last visit.  There are no new concerns.  There are no specific complaints   ROS Denies recent fever or chills. Denies sinus pressure, nasal congestion, ear pain or sore throat. Denies chest congestion, productive cough or wheezing. Denies chest pains, palpitations and leg swelling Denies abdominal pain, nausea, vomiting,diarrhea or constipation.   Denies dysuria, frequency, hesitancy or incontinence. Denies joint pain, swelling and limitation in mobility. Denies headaches, seizures, numbness, or tingling. Denies depression, anxiety or insomnia. Denies skin break down or rash.   PE  BP 118/72 mmHg  Pulse 84  Resp 16  Ht 5\' 2"  (1.575 m)  Wt 189 lb (85.73 kg)  BMI 34.56 kg/m2  SpO2 98%  Patient alert and oriented and in no cardiopulmonary distress.  HEENT: No facial asymmetry, EOMI,   oropharynx pink and moist.  Neck supple no JVD, no mass.  Chest: Clear to auscultation bilaterally.  CVS: S1, S2 no murmurs, no S3.Regular rate.  ABD: Soft non tender.   Ext: No edema  MS: Adequate ROM spine, shoulders, hips and knees.  Skin: Intact, no ulcerations or rash noted.  Psych: Good eye contact, normal affect. Memory intact not anxious or depressed appearing.  CNS: CN 2-12 intact, power,  normal throughout.no focal deficits noted.   Assessment & Plan   Hypertension Controlled, no change in medication DASH diet and commitment to daily physical activity for a minimum of 30 minutes  discussed and encouraged, as a part of hypertension management. The importance of attaining a healthy weight is also discussed.  BP/Weight 01/04/2015 09/06/2014 08/23/2014 05/30/2014 10/04/2013 09/15/2013 6/57/9038  Systolic BP 333 832 919 166 060 045 997  Diastolic BP 72 84 94 82 68 54 80  Wt. (Lbs) 189 191.08 193.6 194.8 204 204 204.5  BMI 34.56 34.94 35.4 35.62 37.3 37.3 36.23        Paroxysmal atrial fibrillation Chronic anticoagullation with coumadin, no bleeding complications  TIA (transient ischemic attack) Asymptomatic, denies any light headedness or any neurologic deficit  Diabetes mellitus type 2 in obese Controlled, no change in medication Samantha Vega is reminded of the importance of commitment to daily physical activity for 30 minutes or more, as able and the need to limit carbohydrate intake to 30 to 60 grams per meal to help with blood sugar control.   The need to take medication as prescribed, test blood sugar as directed, and to call between visits if there is a concern that blood sugar is uncontrolled is also discussed.   Samantha Vega is reminded of the importance of daily foot exam, annual eye examination, and good blood sugar, blood pressure and cholesterol control.  Diabetic Labs Latest Ref Rng 12/27/2014 07/27/2014 05/30/2014 01/10/2014 08/13/2013  HbA1c <5.7 % 5.9(H) 6.0(H) 5.9(H) 6.0(H) 6.3(H)  Microalbumin <2.0 mg/dL 0.6 - 2.8(H) - -  Micro/Creat Ratio 0.0 - 30.0 mg/g 3.5 - 12.3 - -  Chol 0 - 200 mg/dL 138 - 165 141 -  HDL >=46 mg/dL 34(L) - 37(L) 32(L) -  Calc  LDL 0 - 99 mg/dL 87 - 111(H) 90 -  Triglycerides <150 mg/dL 86 - 84 97 -  Creatinine 0.50 - 1.10 mg/dL 0.67 0.57 0.57 0.70 0.64   BP/Weight 01/04/2015 09/06/2014 08/23/2014 05/30/2014 10/04/2013 09/15/2013 3/54/5625  Systolic BP 638 937 342 876 811 572 620  Diastolic BP 72 84 94 82 68 54 80  Wt. (Lbs) 189 191.08 193.6 194.8 204 204 204.5  BMI 34.56 34.94 35.4 35.62 37.3 37.3 36.23   Foot/eye exam completion  dates Latest Ref Rng 08/29/2014 05/30/2014  Eye Exam No Retinopathy No Retinopathy -  Foot Form Completion - - Done         Hyperlipidemia Hyperlipidemia:Low fat diet discussed and encouraged.   Lipid Panel  Lab Results  Component Value Date   CHOL 138 12/27/2014   HDL 34* 12/27/2014   LDLCALC 87 12/27/2014   TRIG 86 12/27/2014   CHOLHDL 4.1 12/27/2014   Needs to commit to daily physical activity     Obesity Improved. Patient re-educated about  the importance of commitment to a  minimum of 150 minutes of exercise per week.  The importance of healthy food choices with portion control discussed. Encouraged to start a food diary, count calories and to consider  joining a support group. Sample diet sheets offered. Goals set by the patient for the next several months.   Weight /BMI 01/04/2015 09/06/2014 08/23/2014  WEIGHT 189 lb 191 lb 1.3 oz 193 lb 9.6 oz  HEIGHT 5\' 2"  5\' 2"  5\' 2"   BMI 34.56 kg/m2 34.94 kg/m2 35.4 kg/m2    Current exercise per week 150 minutes.   Metabolic syndrome X The increased risk of cardiovascular disease associated with this diagnosis, and the need to consistently work on lifestyle to change this is discussed. Following  a  heart healthy diet ,commitment to 30 minutes of exercise at least 5 days per week, as well as control of blood sugar and cholesterol , and achieving a healthy weight are all the areas to be addressed .

## 2015-01-05 NOTE — Assessment & Plan Note (Signed)
Controlled, no change in medication DASH diet and commitment to daily physical activity for a minimum of 30 minutes discussed and encouraged, as a part of hypertension management. The importance of attaining a healthy weight is also discussed.  BP/Weight 01/04/2015 09/06/2014 08/23/2014 05/30/2014 10/04/2013 09/15/2013 2/44/6286  Systolic BP 381 771 165 790 383 338 329  Diastolic BP 72 84 94 82 68 54 80  Wt. (Lbs) 189 191.08 193.6 194.8 204 204 204.5  BMI 34.56 34.94 35.4 35.62 37.3 37.3 36.23

## 2015-01-05 NOTE — Assessment & Plan Note (Signed)
Controlled, no change in medication Samantha Vega is reminded of the importance of commitment to daily physical activity for 30 minutes or more, as able and the need to limit carbohydrate intake to 30 to 60 grams per meal to help with blood sugar control.   The need to take medication as prescribed, test blood sugar as directed, and to call between visits if there is a concern that blood sugar is uncontrolled is also discussed.   Samantha Vega is reminded of the importance of daily foot exam, annual eye examination, and good blood sugar, blood pressure and cholesterol control.  Diabetic Labs Latest Ref Rng 12/27/2014 07/27/2014 05/30/2014 01/10/2014 08/13/2013  HbA1c <5.7 % 5.9(H) 6.0(H) 5.9(H) 6.0(H) 6.3(H)  Microalbumin <2.0 mg/dL 0.6 - 2.8(H) - -  Micro/Creat Ratio 0.0 - 30.0 mg/g 3.5 - 12.3 - -  Chol 0 - 200 mg/dL 138 - 165 141 -  HDL >=46 mg/dL 34(L) - 37(L) 32(L) -  Calc LDL 0 - 99 mg/dL 87 - 111(H) 90 -  Triglycerides <150 mg/dL 86 - 84 97 -  Creatinine 0.50 - 1.10 mg/dL 0.67 0.57 0.57 0.70 0.64   BP/Weight 01/04/2015 09/06/2014 08/23/2014 05/30/2014 10/04/2013 09/15/2013 9/47/0962  Systolic BP 836 629 476 546 503 546 568  Diastolic BP 72 84 94 82 68 54 80  Wt. (Lbs) 189 191.08 193.6 194.8 204 204 204.5  BMI 34.56 34.94 35.4 35.62 37.3 37.3 36.23   Foot/eye exam completion dates Latest Ref Rng 08/29/2014 05/30/2014  Eye Exam No Retinopathy No Retinopathy -  Foot Form Completion - - Done

## 2015-01-05 NOTE — Assessment & Plan Note (Signed)
Chronic anticoagullation with coumadin, no bleeding complications

## 2015-01-17 ENCOUNTER — Other Ambulatory Visit: Payer: Self-pay | Admitting: Family Medicine

## 2015-01-22 ENCOUNTER — Ambulatory Visit (INDEPENDENT_AMBULATORY_CARE_PROVIDER_SITE_OTHER): Payer: Medicare Other | Admitting: *Deleted

## 2015-01-22 ENCOUNTER — Other Ambulatory Visit: Payer: Self-pay | Admitting: *Deleted

## 2015-01-22 DIAGNOSIS — Z5181 Encounter for therapeutic drug level monitoring: Secondary | ICD-10-CM | POA: Diagnosis not present

## 2015-01-22 DIAGNOSIS — I480 Paroxysmal atrial fibrillation: Secondary | ICD-10-CM | POA: Diagnosis not present

## 2015-01-22 DIAGNOSIS — G459 Transient cerebral ischemic attack, unspecified: Secondary | ICD-10-CM | POA: Diagnosis not present

## 2015-01-22 LAB — POCT INR: INR: 2.4

## 2015-01-22 MED ORDER — METOPROLOL TARTRATE 100 MG PO TABS: 100.0000 mg | ORAL_TABLET | Freq: Two times a day (BID) | ORAL | Status: DC

## 2015-02-16 ENCOUNTER — Other Ambulatory Visit: Payer: Self-pay | Admitting: Family Medicine

## 2015-02-28 ENCOUNTER — Other Ambulatory Visit: Payer: Self-pay | Admitting: Family Medicine

## 2015-03-05 ENCOUNTER — Ambulatory Visit (INDEPENDENT_AMBULATORY_CARE_PROVIDER_SITE_OTHER): Payer: Medicare Other | Admitting: *Deleted

## 2015-03-05 DIAGNOSIS — Z5181 Encounter for therapeutic drug level monitoring: Secondary | ICD-10-CM | POA: Diagnosis not present

## 2015-03-05 DIAGNOSIS — I480 Paroxysmal atrial fibrillation: Secondary | ICD-10-CM

## 2015-03-05 DIAGNOSIS — G459 Transient cerebral ischemic attack, unspecified: Secondary | ICD-10-CM

## 2015-03-05 LAB — POCT INR: INR: 3.9

## 2015-03-26 ENCOUNTER — Ambulatory Visit (INDEPENDENT_AMBULATORY_CARE_PROVIDER_SITE_OTHER): Payer: Medicare Other | Admitting: *Deleted

## 2015-03-26 DIAGNOSIS — I480 Paroxysmal atrial fibrillation: Secondary | ICD-10-CM | POA: Diagnosis not present

## 2015-03-26 DIAGNOSIS — Z5181 Encounter for therapeutic drug level monitoring: Secondary | ICD-10-CM

## 2015-03-26 DIAGNOSIS — G459 Transient cerebral ischemic attack, unspecified: Secondary | ICD-10-CM | POA: Diagnosis not present

## 2015-03-26 LAB — POCT INR: INR: 2.8

## 2015-04-16 ENCOUNTER — Other Ambulatory Visit (INDEPENDENT_AMBULATORY_CARE_PROVIDER_SITE_OTHER): Payer: Self-pay | Admitting: Otolaryngology

## 2015-04-16 DIAGNOSIS — E041 Nontoxic single thyroid nodule: Secondary | ICD-10-CM

## 2015-04-19 ENCOUNTER — Ambulatory Visit (HOSPITAL_COMMUNITY)
Admission: RE | Admit: 2015-04-19 | Discharge: 2015-04-19 | Disposition: A | Discharge status: Home / Self Care | Payer: Medicare Other | Source: Ambulatory Visit | Attending: Otolaryngology | Admitting: Otolaryngology

## 2015-04-19 DIAGNOSIS — E041 Nontoxic single thyroid nodule: Secondary | ICD-10-CM

## 2015-04-19 DIAGNOSIS — E042 Nontoxic multinodular goiter: Secondary | ICD-10-CM | POA: Insufficient documentation

## 2015-04-23 ENCOUNTER — Ambulatory Visit (INDEPENDENT_AMBULATORY_CARE_PROVIDER_SITE_OTHER): Payer: Medicare Other | Admitting: *Deleted

## 2015-04-23 ENCOUNTER — Other Ambulatory Visit: Payer: Self-pay | Admitting: *Deleted

## 2015-04-23 DIAGNOSIS — I480 Paroxysmal atrial fibrillation: Secondary | ICD-10-CM | POA: Diagnosis not present

## 2015-04-23 DIAGNOSIS — Z5181 Encounter for therapeutic drug level monitoring: Secondary | ICD-10-CM | POA: Diagnosis not present

## 2015-04-23 DIAGNOSIS — G459 Transient cerebral ischemic attack, unspecified: Secondary | ICD-10-CM

## 2015-04-23 LAB — POCT INR: INR: 2.3

## 2015-04-23 MED ORDER — WARFARIN SODIUM 5 MG PO TABS: ORAL_TABLET | ORAL | Status: DC

## 2015-05-03 ENCOUNTER — Ambulatory Visit (INDEPENDENT_AMBULATORY_CARE_PROVIDER_SITE_OTHER): Payer: Medicare Other | Admitting: Otolaryngology

## 2015-05-03 DIAGNOSIS — D440 Neoplasm of uncertain behavior of thyroid gland: Secondary | ICD-10-CM

## 2015-05-13 ENCOUNTER — Other Ambulatory Visit: Payer: Self-pay | Admitting: Family Medicine

## 2015-05-16 ENCOUNTER — Telehealth: Payer: Self-pay

## 2015-05-16 NOTE — Telephone Encounter (Signed)
Sent in rx.

## 2015-05-18 ENCOUNTER — Other Ambulatory Visit: Payer: Self-pay | Admitting: Pharmacist

## 2015-05-18 ENCOUNTER — Telehealth: Payer: Self-pay | Admitting: Family Medicine

## 2015-05-18 ENCOUNTER — Telehealth: Payer: Self-pay | Admitting: *Deleted

## 2015-05-18 MED ORDER — WARFARIN SODIUM 5 MG PO TABS: ORAL_TABLET | ORAL | Status: DC

## 2015-05-18 NOTE — Telephone Encounter (Signed)
Opened in Error.

## 2015-05-18 NOTE — Telephone Encounter (Signed)
Needs refill on Warfarin sent to [facility] / tg

## 2015-05-19 ENCOUNTER — Other Ambulatory Visit: Payer: Self-pay | Admitting: Family Medicine

## 2015-05-21 ENCOUNTER — Ambulatory Visit (INDEPENDENT_AMBULATORY_CARE_PROVIDER_SITE_OTHER): Payer: Medicare Other | Admitting: *Deleted

## 2015-05-21 DIAGNOSIS — I480 Paroxysmal atrial fibrillation: Secondary | ICD-10-CM

## 2015-05-21 DIAGNOSIS — G459 Transient cerebral ischemic attack, unspecified: Secondary | ICD-10-CM | POA: Diagnosis not present

## 2015-05-21 DIAGNOSIS — Z5181 Encounter for therapeutic drug level monitoring: Secondary | ICD-10-CM | POA: Diagnosis not present

## 2015-05-21 LAB — POCT INR: INR: 1.9

## 2015-05-23 ENCOUNTER — Other Ambulatory Visit: Payer: Self-pay | Admitting: Family Medicine

## 2015-05-28 DIAGNOSIS — E669 Obesity, unspecified: Secondary | ICD-10-CM | POA: Diagnosis not present

## 2015-05-28 DIAGNOSIS — E8881 Metabolic syndrome: Secondary | ICD-10-CM | POA: Diagnosis not present

## 2015-05-28 DIAGNOSIS — E785 Hyperlipidemia, unspecified: Secondary | ICD-10-CM | POA: Diagnosis not present

## 2015-05-28 DIAGNOSIS — E119 Type 2 diabetes mellitus without complications: Secondary | ICD-10-CM | POA: Diagnosis not present

## 2015-06-04 LAB — HEMOGLOBIN A1C
A1c: 6
LDL: 97

## 2015-06-07 ENCOUNTER — Encounter: Payer: Self-pay | Admitting: Family Medicine

## 2015-06-07 ENCOUNTER — Ambulatory Visit (INDEPENDENT_AMBULATORY_CARE_PROVIDER_SITE_OTHER): Payer: Medicare Other | Admitting: Family Medicine

## 2015-06-07 VITALS — BP 128/82 | HR 67 | Resp 16 | Ht 62.0 in | Wt 191.0 lb

## 2015-06-07 DIAGNOSIS — E785 Hyperlipidemia, unspecified: Secondary | ICD-10-CM

## 2015-06-07 DIAGNOSIS — I10 Essential (primary) hypertension: Secondary | ICD-10-CM

## 2015-06-07 DIAGNOSIS — Z1211 Encounter for screening for malignant neoplasm of colon: Secondary | ICD-10-CM

## 2015-06-07 DIAGNOSIS — E669 Obesity, unspecified: Secondary | ICD-10-CM

## 2015-06-07 DIAGNOSIS — E119 Type 2 diabetes mellitus without complications: Secondary | ICD-10-CM

## 2015-06-07 DIAGNOSIS — Z0000 Encounter for general adult medical examination without abnormal findings: Secondary | ICD-10-CM

## 2015-06-07 DIAGNOSIS — E559 Vitamin D deficiency, unspecified: Secondary | ICD-10-CM

## 2015-06-07 DIAGNOSIS — Z1159 Encounter for screening for other viral diseases: Secondary | ICD-10-CM

## 2015-06-07 DIAGNOSIS — Z23 Encounter for immunization: Secondary | ICD-10-CM

## 2015-06-07 DIAGNOSIS — E1169 Type 2 diabetes mellitus with other specified complication: Secondary | ICD-10-CM

## 2015-06-07 DIAGNOSIS — M129 Arthropathy, unspecified: Secondary | ICD-10-CM

## 2015-06-07 DIAGNOSIS — I480 Paroxysmal atrial fibrillation: Secondary | ICD-10-CM

## 2015-06-07 DIAGNOSIS — M1710 Unilateral primary osteoarthritis, unspecified knee: Secondary | ICD-10-CM

## 2015-06-07 HISTORY — DX: Encounter for general adult medical examination without abnormal findings: Z00.00

## 2015-06-07 LAB — POC HEMOCCULT BLD/STL (OFFICE/1-CARD/DIAGNOSTIC): FECAL OCCULT BLD: NEGATIVE

## 2015-06-07 MED ORDER — TRAMADOL HCL 50 MG PO TABS: ORAL_TABLET | ORAL | Status: DC

## 2015-06-07 MED ORDER — DILTIAZEM HCL ER COATED BEADS 120 MG PO CP24
120.0000 mg | ORAL_CAPSULE | Freq: Every day | ORAL | Status: DC
Start: 2015-06-07 — End: 2015-09-27

## 2015-06-07 MED ORDER — DICLOFENAC SODIUM 1 % TD GEL: 4.0000 g | Freq: Four times a day (QID) | TRANSDERMAL | Status: DC

## 2015-06-07 MED ORDER — BENAZEPRIL HCL 20 MG PO TABS: ORAL_TABLET | ORAL | Status: DC

## 2015-06-07 NOTE — Patient Instructions (Addendum)
Annual wellness in 4 month, call if you need me sooner  Congrats on excellent health,. Labs are excellent, and weight is good  You are referred to Dr Oneida Alar  for screening colonoscopy  You are referred to cardiologist in [place] for follow up  Non fasting chem 7 and EGFr, hBA1C, vit D , tSH andf hep C screen in 4 month  Thanks for choosing [facility], we consider it a privelige to serve you.  All the best for 2017!  Flu vaccine today

## 2015-06-07 NOTE — Progress Notes (Signed)
   Subjective:    Patient ID: Samantha Vega, female    DOB: 1951-03-11, 64 y.o.   MRN: [id number]  HPI Patient is in for annual physical exam. No other health concerns are expressed or addressed at the visit. Recent labs, e are reviewed. Immunization is reviewed , and  updated     Review of Systems See HPI     Objective:   Physical Exam BP 128/82 mmHg  Pulse 67  Resp 16  Ht 5\' 2"  (1.575 m)  Wt 191 lb (86.637 kg)  BMI 34.93 kg/m2  SpO2 97% Pleasant well nourished female, alert and oriented x 3, in no cardio-pulmonary distress. Afebrile. HEENT No facial trauma or asymetry. Sinuses non tender.  Extra occullar muscles intact, pupils equally reactive to light. External ears normal, tympanic membranes clear. Oropharynx moist, no exudate, edentulous Neck: supple, no adenopathy,JVD or thyromegaly.No bruits.  Chest: Clear to ascultation bilaterally.No crackles or wheezes. Non tender to palpation  Breast: No asymetry,no masses or lumps. No tenderness. No nipple discharge or inversion. No axillary or supraclavicular adenopathy  Cardiovascular system; Heart sounds normal,  S1 and  S2 ,no S3.  No murmur, or thrill. Apical beat not displaced Peripheral pulses normal.  Abdomen: Soft, non tender, no organomegaly or masses. No bruits. Bowel sounds normal. No guarding, tenderness or rebound.  Rectal:  Normal sphincter tone. No mass.No rectal masses.  Guaiac negative stool.  GU: External genitalia normal female genitalia , female distribution of hair. No lesions. Urethral meatus normal in size, no  Prolapse, no lesions visibly  Present. Bladder non tender. Vagina pink and moist , with no visible lesions , discharge present . Adequate pelvic support no  cystocele or rectocele noted  Uterus absent, no adnexal masses, no  adnexal tenderness.   Musculoskeletal exam: Full ROM of spine, hips , shoulders and knees. No deformity ,swelling or crepitus noted. No muscle  wasting or atrophy.   Neurologic: Cranial nerves 2 to 12 intact. Power, tone ,sensation and reflexes normal throughout. No disturbance in gait. No tremor.  Skin: Intact, no ulceration, erythema , scaling or rash noted. Pigmentation normal throughout  Psych; Normal mood and affect. Judgement and concentration normal        Assessment & Plan:  Annual physical exam Annual exam as documented. Counseling done  re healthy lifestyle involving commitment to 150 minutes exercise per week, heart healthy diet, and attaining healthy weight.The importance of adequate sleep also discussed. Regular seat belt use and home safety, is also discussed. Changes in health habits are decided on by the patient with goals and time frames  set for achieving them. Immunization and cancer screening needs are specifically addressed at this visit.

## 2015-06-07 NOTE — Assessment & Plan Note (Signed)

## 2015-06-14 ENCOUNTER — Ambulatory Visit (INDEPENDENT_AMBULATORY_CARE_PROVIDER_SITE_OTHER): Payer: Medicare Other | Admitting: Cardiology

## 2015-06-14 ENCOUNTER — Encounter: Payer: Self-pay | Admitting: Cardiology

## 2015-06-14 VITALS — BP 132/90 | HR 94 | Ht 64.0 in | Wt 191.0 lb

## 2015-06-14 DIAGNOSIS — E785 Hyperlipidemia, unspecified: Secondary | ICD-10-CM | POA: Diagnosis not present

## 2015-06-14 DIAGNOSIS — I10 Essential (primary) hypertension: Secondary | ICD-10-CM

## 2015-06-14 DIAGNOSIS — I480 Paroxysmal atrial fibrillation: Secondary | ICD-10-CM

## 2015-06-14 NOTE — Patient Instructions (Signed)
Your physician wants you to follow-up in: 1 year with Dr Bryna Colander will receive a reminder letter in the mail two months in advance. If you don't receive a letter, please call our office to schedule the follow-up appointment.     STOP Aspirin     If you need a refill on your cardiac medications before your next appointment, please call your pharmacy.     Thank you for choosing [facility] !

## 2015-06-14 NOTE — Progress Notes (Signed)
Patient ID: Samantha Vega, female   DOB: 01-Jul-1950, 64 y.o.   MRN: [id number]     Clinical Summary Samantha Vega is a 64 y.o.female seen today for follow up of the following medical problems.   1. Paroxysmal afib - rate control strategy w/ diltiazem and metoprolol  - previously on xarelto however cost was too much, changed to coumadin.  - Denies any palpitations. Denies any bleeding on coumadin.   2. HTN  - does not check regularly  -compliant w/ meds   3. HL  - 12/2014: TC 138 TG 86 HDL 34 LDL 87 - compliant with statin  Past Medical History  Diagnosis Date  . Right bundle Samantha Vega block     +Palpitations.   EKG 11/2006:  NSR, RBBB, LAFB, markedly delayed R-wave progression, no change; Echocardiogram in 10/2006-suboptimal quality, normal; Event recorder-PVCs, no significant arrhythmias or symptoms ;  . Obesity   . Hyperlipidemia   . Hypertension   . Diabetes mellitus   . Chest pain     Noncardiac; evaluated and treated in 2008  . Enlarged heart   . Arthritis   . PAF (paroxysmal atrial fibrillation) (HCC)      No Known Allergies   Current Outpatient Prescriptions  Medication Sig Dispense Refill  . aspirin EC 81 MG tablet Take 81 mg by mouth daily.    Marland Kitchen atorvastatin (LIPITOR) 40 MG tablet TAKE 1 TABLET BY MOUTH EVERY DAY 30 tablet 1  . benazepril (LOTENSIN) 20 MG tablet TAKE 1 TABLET (20 MG TOTAL) BY MOUTH DAILY. 30 tablet 5  . calcium-vitamin D (OSCAL 500/200 D-3) 500 MG tablet Take 1 tablet by mouth 2 (two) times daily.     . diclofenac sodium (VOLTAREN) 1 % GEL Apply 4 g topically 4 (four) times daily. 100 g 3  . diltiazem (CARDIZEM CD) 120 MG 24 hr capsule Take 1 capsule (120 mg total) by mouth daily. 30 capsule 5  . glucose blood (ONE TOUCH ULTRA TEST) test strip USE ONCE DAILY dx E11.9 50 each 5  . metFORMIN (GLUCOPHAGE) 500 MG tablet Take 1 tablet (500 mg total) by mouth 2 (two) times daily with a meal. 180 tablet 1  . metoprolol (LOPRESSOR) 100 MG tablet Take  1 tablet (100 mg total) by mouth 2 (two) times daily. 60 tablet 8  . traMADol (ULTRAM) 50 MG tablet TAKE 1 TABLET EVERY DAY AS NEEDED 30 tablet 3  . warfarin (COUMADIN) 5 MG tablet Take as directed by [facility]. 45 tablet 3   No current facility-administered medications for this visit.     Past Surgical History  Procedure Laterality Date  . Total abdominal hysterectomy w/ bilateral salpingoophorectomy    . Shoulder surgery  2006    Left-post-trauma  . Abdominal hysterectomy       No Known Allergies    Family History  Problem Relation Age of Onset  . Hypertension Mother     and sister  . Arthritis Mother   . Stroke Father   . Hypertension Father   . Coronary artery disease Father   . Diabetes Sister     x1  . Sarcoidosis Sister     x1  . Heart failure Brother      Social History Samantha Vega reports that she has never smoked. She has never used smokeless tobacco. Samantha Vega reports that she does not drink alcohol.   Review of Systems CONSTITUTIONAL: No weight loss, fever, chills, weakness or fatigue.  HEENT: Eyes: No visual loss, blurred  vision, double vision or yellow sclerae.No hearing loss, sneezing, congestion, runny nose or sore throat.  SKIN: No rash or itching.  CARDIOVASCULAR: per hpi RESPIRATORY: No shortness of breath, cough or sputum.  GASTROINTESTINAL: No anorexia, nausea, vomiting or diarrhea. No abdominal pain or blood.  GENITOURINARY: No burning on urination, no polyuria NEUROLOGICAL: No headache, dizziness, syncope, paralysis, ataxia, numbness or tingling in the extremities. No change in bowel or bladder control.  MUSCULOSKELETAL: No muscle, back pain, joint pain or stiffness.  LYMPHATICS: No enlarged nodes. No history of splenectomy.  PSYCHIATRIC: No history of depression or anxiety.  ENDOCRINOLOGIC: No reports of sweating, cold or heat intolerance. No polyuria or polydipsia.  Marland Kitchen   Physical Examination Filed Vitals:   06/14/15 1118    BP: 132/90  Pulse: 94   Filed Vitals:   06/14/15 1118  Height: 5\' 4"  (1.626 m)  Weight: 191 lb (86.637 kg)    Gen: resting comfortably, no acute distress HEENT: no scleral icterus, pupils equal round and reactive, no palptable cervical adenopathy,  CV: RRR,no m/r/g, no jvd Resp: Clear to auscultation bilaterally GI: abdomen is soft, non-tender, non-distended, normal bowel sounds, no hepatosplenomegaly MSK: extremities are warm, no edema.  Skin: warm, no rash Neuro:  no focal deficits Psych: appropriate affect   Diagnostic Studies  07/12/12 Echo: LVEF 55-60%, mild basal septal hypertrophy,   07/11/12 Carotid US: <50% bilateral stenosis, 3.4 cm thyroid nodule   03/07/13 EKG: SR, RBBB, LAFB, LAE, non-spec ST/T changes     Assessment and Plan  1. Parox afib  - no significant symptoms -  will continue current rate control strategy.  - continue coumadin.   2. HTN:  - at goal, continue current meds   3. HL:  - at goal, continue current statin.     F/u 1 year. Request pcp labs    Arnoldo Lenis, M.D.

## 2015-06-20 ENCOUNTER — Ambulatory Visit (INDEPENDENT_AMBULATORY_CARE_PROVIDER_SITE_OTHER): Payer: Medicare Other | Admitting: *Deleted

## 2015-06-20 DIAGNOSIS — G459 Transient cerebral ischemic attack, unspecified: Secondary | ICD-10-CM

## 2015-06-20 DIAGNOSIS — Z5181 Encounter for therapeutic drug level monitoring: Secondary | ICD-10-CM

## 2015-06-20 DIAGNOSIS — I480 Paroxysmal atrial fibrillation: Secondary | ICD-10-CM

## 2015-06-20 LAB — POCT INR: INR: 1.9

## 2015-06-26 ENCOUNTER — Encounter: Payer: Self-pay | Admitting: Family Medicine

## 2015-06-27 ENCOUNTER — Telehealth: Payer: Self-pay

## 2015-06-27 NOTE — Telephone Encounter (Signed)
[phone]  PATIENT RECEIVED LETTER TO SCHEDULE TCS

## 2015-06-27 NOTE — Telephone Encounter (Signed)
Tried to call pt. Could not leave a VM. Looks like she is on coumadin and will need an OV prior to colonoscopy. [employer] Medicare Complete  [employer] card scanned in for 2017.

## 2015-06-29 ENCOUNTER — Other Ambulatory Visit: Payer: Self-pay | Admitting: Family Medicine

## 2015-07-02 ENCOUNTER — Telehealth: Payer: Self-pay

## 2015-07-02 ENCOUNTER — Telehealth: Payer: Self-pay | Admitting: Family Medicine

## 2015-07-02 MED ORDER — GLUCOSE BLOOD VI STRP: ORAL_STRIP | Status: DC

## 2015-07-02 NOTE — Telephone Encounter (Signed)
Pt called for DS. She had received a letter from DS. She is aware that DS is out this week. Patient said she would call back next Monday.

## 2015-07-02 NOTE — Telephone Encounter (Signed)
Coughing real bad poss fever, symptoms x's 3 days she is asking if Dr. Moshe Cipro would call her in something please advise?

## 2015-07-02 NOTE — Telephone Encounter (Signed)
Tried to call with no answer  

## 2015-07-02 NOTE — Telephone Encounter (Signed)
Called patient and left message for them to return call at the office   

## 2015-07-02 NOTE — Telephone Encounter (Signed)
Coughing x 3 days up some clear white phlegm, no other symptoms, will get some robitussin dm sugar free and try for a couple days and call back if she needs to come in

## 2015-07-05 ENCOUNTER — Telehealth: Payer: Self-pay

## 2015-07-05 NOTE — Telephone Encounter (Signed)
Talked with patient and she is coming in on 07/11/15 @ 10:00

## 2015-07-05 NOTE — Telephone Encounter (Signed)
Pt is coming in on 07/11/15 @ 10:00 am

## 2015-07-05 NOTE — Telephone Encounter (Signed)
Pt called again to speak with DS. I told her that DS was out this week and should be back on Monday. I told her that DS is the triage nurse and would be in touch with her. [phone]

## 2015-07-05 NOTE — Telephone Encounter (Signed)
See other phone note

## 2015-07-11 ENCOUNTER — Ambulatory Visit: Payer: Medicare Other | Admitting: Nurse Practitioner

## 2015-07-16 ENCOUNTER — Ambulatory Visit (INDEPENDENT_AMBULATORY_CARE_PROVIDER_SITE_OTHER): Payer: Medicare Other | Admitting: *Deleted

## 2015-07-16 ENCOUNTER — Other Ambulatory Visit: Payer: Self-pay | Admitting: Family Medicine

## 2015-07-16 DIAGNOSIS — Z5181 Encounter for therapeutic drug level monitoring: Secondary | ICD-10-CM

## 2015-07-16 DIAGNOSIS — G459 Transient cerebral ischemic attack, unspecified: Secondary | ICD-10-CM

## 2015-07-16 DIAGNOSIS — I480 Paroxysmal atrial fibrillation: Secondary | ICD-10-CM

## 2015-07-16 LAB — POCT INR: INR: 2

## 2015-07-30 ENCOUNTER — Ambulatory Visit: Payer: Medicare Other | Admitting: Nurse Practitioner

## 2015-08-13 ENCOUNTER — Ambulatory Visit (INDEPENDENT_AMBULATORY_CARE_PROVIDER_SITE_OTHER): Payer: Medicare Other | Admitting: *Deleted

## 2015-08-13 DIAGNOSIS — Z5181 Encounter for therapeutic drug level monitoring: Secondary | ICD-10-CM

## 2015-08-13 DIAGNOSIS — I480 Paroxysmal atrial fibrillation: Secondary | ICD-10-CM

## 2015-08-13 DIAGNOSIS — G459 Transient cerebral ischemic attack, unspecified: Secondary | ICD-10-CM | POA: Diagnosis not present

## 2015-08-13 LAB — POCT INR: INR: 1.4

## 2015-08-16 ENCOUNTER — Ambulatory Visit: Payer: Medicare Other | Admitting: Nurse Practitioner

## 2015-08-20 ENCOUNTER — Telehealth: Payer: Self-pay | Admitting: Cardiology

## 2015-08-20 NOTE — Telephone Encounter (Signed)
Pt is needing to have a colonoscopy done and is wondering when she needs to stop her coumadin

## 2015-08-24 NOTE — Telephone Encounter (Signed)
Spoke with pt.  She states she has a screening on Monday with the GI doctor.  She was not very clear on what the appointment was for but she did say she was not told do to any prep beforehand.  Given this, I suspect it is just a consultation.  I have asked patient to call back after her appointment on Monday to give [place] more details.

## 2015-08-27 ENCOUNTER — Telehealth: Payer: Self-pay

## 2015-08-27 ENCOUNTER — Ambulatory Visit (INDEPENDENT_AMBULATORY_CARE_PROVIDER_SITE_OTHER): Payer: Medicare Other | Admitting: *Deleted

## 2015-08-27 ENCOUNTER — Ambulatory Visit (INDEPENDENT_AMBULATORY_CARE_PROVIDER_SITE_OTHER): Payer: Medicare Other | Admitting: Nurse Practitioner

## 2015-08-27 ENCOUNTER — Encounter: Payer: Self-pay | Admitting: Nurse Practitioner

## 2015-08-27 VITALS — BP 141/91 | HR 88 | Temp 97.0°F | Ht 63.0 in | Wt 188.0 lb

## 2015-08-27 DIAGNOSIS — Z1211 Encounter for screening for malignant neoplasm of colon: Secondary | ICD-10-CM

## 2015-08-27 DIAGNOSIS — R0789 Other chest pain: Secondary | ICD-10-CM | POA: Diagnosis not present

## 2015-08-27 DIAGNOSIS — Z7901 Long term (current) use of anticoagulants: Secondary | ICD-10-CM | POA: Diagnosis not present

## 2015-08-27 DIAGNOSIS — G459 Transient cerebral ischemic attack, unspecified: Secondary | ICD-10-CM

## 2015-08-27 DIAGNOSIS — I480 Paroxysmal atrial fibrillation: Secondary | ICD-10-CM | POA: Diagnosis not present

## 2015-08-27 DIAGNOSIS — Z5181 Encounter for therapeutic drug level monitoring: Secondary | ICD-10-CM

## 2015-08-27 LAB — POCT INR: INR: 1.9

## 2015-08-27 NOTE — Telephone Encounter (Signed)
Forwarding to Eric Gill, NP and Ginger.  

## 2015-08-27 NOTE — Telephone Encounter (Signed)
Can this patient get in with our NP this week or next week for chest pain  Zandra Abts MD

## 2015-08-27 NOTE — Patient Instructions (Signed)
1. We will contact Dr. Harl Bowie at cardiology and notify them of your intermittent chest soreness. We will request a clearance from them to perform your colonoscopy. 2. We will also ask the cardiologist for instructions on your Coumadin as they are the one to manage it. 3. When we receive information back from the cardiologist, we will schedule your procedure for you over the phone. 4. Return for follow-up as needed or based on postprocedure recommendations.

## 2015-08-27 NOTE — Assessment & Plan Note (Signed)
Patient with complaints of "intermittent chest soreness" but denies overt chest pain. Has previously been evaluated for chest pain in 2008 and deemed noncardiac in nature, last EKG in 2016 with her cardiologist. Has a history of paroxysmal atrial fibrillation and heart enlargement. We will send this information to her cardiologist to request cardiac clearance to do procedure. Scheduling of colonoscopy dependent on cardiac clearance.

## 2015-08-27 NOTE — Telephone Encounter (Signed)
We will have her come to clinic for evaluation. Regarding the coumadin ok to hold coumadin, does not need lovenox bridge.  Zandra Abts MD

## 2015-08-27 NOTE — Progress Notes (Signed)
cc'ed to pcp °

## 2015-08-27 NOTE — Progress Notes (Signed)
Primary Care Physician:  Tula Nakayama, MD Primary Gastroenterologist:  Dr. Oneida Alar  Chief Complaint  Patient presents with  . set up TCS    HPI:   Samantha Vega is a 65 y.o. female who presents on referral for colonoscopy. In our system appears she last had a colonoscopy on 03/06/2005 by Dr. Tamala Julian. Full note not available although result note in procedures tab shows "normal" recommended repeat in 2009. No record of follow-up colonoscopy could be found in our system.  Today she states her last colonoscopy was in 2006. She is on coumadin for paroxysmal AFib, see's Dr. Harl Bowie in cardiology and Coumadin clinic pharmacist. Denies abdominal pain, N/V, hematochezia, melena, fever, chills, unintentional weight loss, changes in bowel habits. Daily bowel movements, consistent with [place] 4. Admits intermittent chest soreness, this has been evaluated in 2008 and deemed non-cardiac, last EKG 2012. Has not notified cardiology of this intermittent discomfort. Denies history of TIA or CVA although it is listed in her problem list and noted asymptomatic with no neurological deficits. Denies dyspnea, dizziness, lightheadedness, syncope, near syncope. Denies any other upper or lower GI symptoms.  Past Medical History  Diagnosis Date  . Right bundle branch block     +Palpitations.   EKG 11/2006:  NSR, RBBB, LAFB, markedly delayed R-wave progression, no change; Echocardiogram in 10/2006-suboptimal quality, normal; Event recorder-PVCs, no significant arrhythmias or symptoms ;  . Obesity   . Hyperlipidemia   . Hypertension   . Diabetes mellitus   . Chest pain     Noncardiac; evaluated and treated in 2008  . Enlarged heart   . Arthritis   . PAF (paroxysmal atrial fibrillation) [facility])     Past Surgical History  Procedure Laterality Date  . Total abdominal hysterectomy w/ bilateral salpingoophorectomy    . Shoulder surgery  2006    Left-post-trauma  . Abdominal hysterectomy      Current  Outpatient Prescriptions  Medication Sig Dispense Refill  . atorvastatin (LIPITOR) 40 MG tablet TAKE 1 TABLET BY MOUTH EVERY DAY 30 tablet 3  . benazepril (LOTENSIN) 20 MG tablet TAKE 1 TABLET BY MOUTH EVERY DAY 30 tablet 2  . calcium-vitamin D (OSCAL 500/200 D-3) 500 MG tablet Take 1 tablet by mouth 2 (two) times daily.     . diclofenac sodium (VOLTAREN) 1 % GEL Apply 4 g topically 4 (four) times daily. 100 g 3  . diltiazem (CARDIZEM CD) 120 MG 24 hr capsule Take 1 capsule (120 mg total) by mouth daily. 30 capsule 5  . glucose blood (ONE TOUCH ULTRA TEST) test strip USE ONCE DAILY dx E11.9 50 each 5  . metFORMIN (GLUCOPHAGE) 500 MG tablet Take 1 tablet (500 mg total) by mouth 2 (two) times daily with a meal. 180 tablet 1  . metoprolol (LOPRESSOR) 100 MG tablet Take 1 tablet (100 mg total) by mouth 2 (two) times daily. 60 tablet 8  . Multiple Vitamins-Minerals ([employer] SPECTRAVITE PO) Take by mouth.    . traMADol (ULTRAM) 50 MG tablet TAKE 1 TABLET EVERY DAY AS NEEDED 30 tablet 3  . warfarin (COUMADIN) 5 MG tablet Take as directed by Coumadin Clinic. (Patient taking differently: Take as directed by Coumadin Clinic.  1 tab daily except 1 1/2 tab on mon, wed, fri) 45 tablet 3   No current facility-administered medications for this visit.    Allergies as of 08/27/2015  . (No Known Allergies)    Family History  Problem Relation Age of Onset  . Hypertension Mother  and sister  . Arthritis Mother   . Stroke Father   . Hypertension Father   . Coronary artery disease Father   . Diabetes Sister     x1  . Sarcoidosis Sister     x1  . Heart failure Brother     Social History   Social History  . Marital Status: Married    Spouse Name: N/A  . Number of Children: 0  . Years of Education: N/A   Occupational History  . Engineer, materials Unemployed   Social History Main Topics  . Smoking status: Never Smoker   . Smokeless tobacco: Never Used  . Alcohol Use: No  . Drug Use: No  .  Sexual Activity: Yes   Other Topics Concern  . Not on file   Social History Narrative    Review of Systems: 10-point ROS negative except as per HPI.   Physical Exam: BP 141/91 mmHg  Pulse 88  Temp(Src) 97 F (36.1 C)  Ht 5\' 3"  (1.6 m)  Wt 188 lb (85.276 kg)  BMI 33.31 kg/m2 General:   Alert and oriented. Pleasant and cooperative. Well-nourished and well-developed.  Head:  Normocephalic and atraumatic. Eyes:  Without icterus, sclera clear and conjunctiva pink.  Ears:  Normal auditory acuity. Cardiovascular:  S1, S2 present without murmurs appreciated. Frequent ectopy, query PACs or PVCs. Extremities without clubbing. Mild LE bilateral non-pitting edema. Respiratory:  Clear to auscultation bilaterally. No wheezes, rales, or rhonchi. No distress.  Gastrointestinal:  +BS, rounded but soft, non-tender and non-distended. No HSM noted. No guarding or rebound. No masses appreciated.  Rectal:  Deferred  Musculoskalatal:  Symmetrical without gross deformities. Skin:  Intact without significant lesions or rashes. Neurologic:  Alert and oriented x4;  grossly normal neurologically. Psych:  Alert and cooperative. Normal mood and affect. Heme/Lymph/Immune: No excessive bruising noted.    08/27/2015 9:23 AM   Disclaimer: This note was dictated with voice recognition software. Similar sounding words can inadvertently be transcribed and may not be corrected upon review.

## 2015-08-27 NOTE — Telephone Encounter (Signed)
Dr. Harl Bowie,   This patient was seen in our office today by Walden Field, NP to be scheduled for a screening colonoscopy.  However, she had complaints of intermittent chest soreness and we would like to have a cardiac clearance prior to scheduling the procedure.  Also, when we do get her scheduled, is it OK to hold coumadin prior to procedure and will she need Lovenox bridge?  Please advise!

## 2015-08-27 NOTE — Assessment & Plan Note (Signed)
Last colonoscopy 2006 with Dr. Tamala Julian with apparent recommendation for repeat in 2009 which is not done. At this point she is overdue for screening colonoscopy. Her situation is complicated by chronic anticoagulation on Coumadin as well as intermittent chest soreness as noted above. After information received back from cardiology, as noted above, we will proceed with scheduling her colonoscopy for surveillance. Return for follow-up as needed or based on postprocedure recommendations. At this time she is generally asymptomatic from a GI standpoint.  Proceed with colonoscopy with 12.5 mg pre-procedure Phenergan with Dr. Oneida Alar in the near future. The risks, benefits, and alternatives have been discussed in detail with the patient. They state understanding and desire to proceed.   The patient is on Coumadin, not on chronic anxiolytics or antidepressants. She does take Ultram daily as needed. We will add 12.5 mg preprocedure Phenergan to promote adequate sedation

## 2015-08-27 NOTE — Telephone Encounter (Signed)
Noted  

## 2015-08-27 NOTE — Assessment & Plan Note (Signed)
Patient on Coumadin for paroxysmal atrial fibrillation and possibly for TIA as well, although she denies TIA and CVA history. We will request guidance from cardiology/Coumadin clinic on holding Coumadin prior to procedure and possible need for Lovenox bridge. When receive this guidance as well as cardiac clearance is requested above we will proceed with scheduling of colonoscopy.

## 2015-08-27 NOTE — Telephone Encounter (Signed)
Terry   Please schedule per Dr Clovia Cuff

## 2015-09-03 NOTE — Telephone Encounter (Signed)
Pt's appt with Dr. Harl Bowie is 09/04/2015.

## 2015-09-04 ENCOUNTER — Ambulatory Visit (INDEPENDENT_AMBULATORY_CARE_PROVIDER_SITE_OTHER): Payer: Medicare Other | Admitting: Cardiology

## 2015-09-04 VITALS — BP 108/66 | HR 61 | Ht 63.0 in | Wt 189.0 lb

## 2015-09-04 DIAGNOSIS — E785 Hyperlipidemia, unspecified: Secondary | ICD-10-CM | POA: Diagnosis not present

## 2015-09-04 DIAGNOSIS — R072 Precordial pain: Secondary | ICD-10-CM

## 2015-09-04 DIAGNOSIS — I10 Essential (primary) hypertension: Secondary | ICD-10-CM | POA: Diagnosis not present

## 2015-09-04 DIAGNOSIS — I480 Paroxysmal atrial fibrillation: Secondary | ICD-10-CM

## 2015-09-04 NOTE — Progress Notes (Signed)
Patient ID: IVERSON BEILFUSS, female   DOB: Feb 10, 1951, 65 y.o.   MRN: [id number]     Clinical Summary Ms. Mckiver is a 65 y.o.female seen today for follow up of the following medical problems.   1. Chest pain - started several days ago. Soreness midchest, 2-3/10. Can occur at rest or with exertion. No other associated symptoms. Not positonal. Not related to eating. Can last several days in a row without relief, worst with pressing on area.  - colonscopy was postponed based on symptoms until cardiac evaluation   2. Paroxysmal afib - rate control strategy w/ diltiazem and metoprolol  - previously on xarelto however cost was too much, changed to coumadin.  - Denies any palpitations since our last visit. Denies any bleeding on coumadin.   3. HTN  - does not check regularly   4. HL  - 12/2014: TC 138 TG 86 HDL 34 LDL 87 - compliant with statin  Past Medical History  Diagnosis Date  . Right bundle [place] Devera block     +Palpitations.   EKG 11/2006:  NSR, RBBB, LAFB, markedly delayed R-wave progression, no change; Echocardiogram in 10/2006-suboptimal quality, normal; Event recorder-PVCs, no significant arrhythmias or symptoms ;  . Obesity   . Hyperlipidemia   . Hypertension   . Diabetes mellitus   . Chest pain     Noncardiac; evaluated and treated in 2008  . Enlarged heart   . Arthritis   . PAF (paroxysmal atrial fibrillation) (HCC)      No Known Allergies   Current Outpatient Prescriptions  Medication Sig Dispense Refill  . atorvastatin (LIPITOR) 40 MG tablet TAKE 1 TABLET BY MOUTH EVERY DAY 30 tablet 3  . benazepril (LOTENSIN) 20 MG tablet TAKE 1 TABLET BY MOUTH EVERY DAY 30 tablet 2  . calcium-vitamin D (OSCAL 500/200 D-3) 500 MG tablet Take 1 tablet by mouth 2 (two) times daily.     . diclofenac sodium (VOLTAREN) 1 % GEL Apply 4 g topically 4 (four) times daily. 100 g 3  . diltiazem (CARDIZEM CD) 120 MG 24 hr capsule Take 1 capsule (120 mg total) by mouth daily. 30 capsule  5  . glucose blood (ONE TOUCH ULTRA TEST) test strip USE ONCE DAILY dx E11.9 50 each 5  . metFORMIN (GLUCOPHAGE) 500 MG tablet Take 1 tablet (500 mg total) by mouth 2 (two) times daily with a meal. 180 tablet 1  . metoprolol (LOPRESSOR) 100 MG tablet Take 1 tablet (100 mg total) by mouth 2 (two) times daily. 60 tablet 8  . Multiple Vitamins-Minerals (CVS SPECTRAVITE PO) Take by mouth.    . traMADol (ULTRAM) 50 MG tablet TAKE 1 TABLET EVERY DAY AS NEEDED 30 tablet 3  . warfarin (COUMADIN) 5 MG tablet Take as directed by Coumadin Clinic. (Patient taking differently: Take as directed by Coumadin Clinic.  1 tab daily except 1 1/2 tab on mon, wed, fri) 45 tablet 3   No current facility-administered medications for this visit.     Past Surgical History  Procedure Laterality Date  . Total abdominal hysterectomy w/ bilateral salpingoophorectomy    . Shoulder surgery  2006    Left-post-trauma  . Abdominal hysterectomy    . Colonoscopy  2006    Dr. Tamala Julian: Normal, repeat in 2009.     No Known Allergies    Family History  Problem Relation Age of Onset  . Hypertension Mother     and sister  . Arthritis Mother   . Stroke Father   .  Hypertension Father   . Coronary artery disease Father   . Diabetes Sister     x1  . Sarcoidosis Sister     x1  . Heart failure Brother   . Colon cancer Neg Hx      Social History Ms. Kozyra reports that she has never smoked. She has never used smokeless tobacco. Ms. Ankeney reports that she does not drink alcohol.   Review of Systems CONSTITUTIONAL: No weight loss, fever, chills, weakness or fatigue.  HEENT: Eyes: No visual loss, blurred vision, double vision or yellow sclerae.No hearing loss, sneezing, congestion, runny nose or sore throat.  SKIN: No rash or itching.  CARDIOVASCULAR: per HPI RESPIRATORY: No shortness of breath, cough or sputum.  GASTROINTESTINAL: No anorexia, nausea, vomiting or diarrhea. No abdominal pain or blood.    GENITOURINARY: No burning on urination, no polyuria NEUROLOGICAL: No headache, dizziness, syncope, paralysis, ataxia, numbness or tingling in the extremities. No change in bowel or bladder control.  MUSCULOSKELETAL: No muscle, back pain, joint pain or stiffness.  LYMPHATICS: No enlarged nodes. No history of splenectomy.  PSYCHIATRIC: No history of depression or anxiety.  ENDOCRINOLOGIC: No reports of sweating, cold or heat intolerance. No polyuria or polydipsia.  Marland Kitchen   Physical Examination Filed Vitals:   09/04/15 1325  BP: 108/66  Pulse: 61   Filed Vitals:   09/04/15 1325  Height: 5\' 3"  (1.6 m)  Weight: 189 lb (85.73 kg)    Gen: resting comfortably, no acute distress HEENT: no scleral icterus, pupils equal round and reactive, no palptable cervical adenopathy,  CV: irreg, no m/r/g, no jvd Resp: Clear to auscultation bilaterally GI: abdomen is soft, non-tender, non-distended, normal bowel sounds, no hepatosplenomegaly MSK: chest wall tender to palpation Skin: warm, no rash Neuro:  no focal deficits Psych: appropriate affect   Diagnostic Studies 07/12/12 Echo: LVEF 55-60%, mild basal septal hypertrophy,   07/11/12 Carotid US: <50% bilateral stenosis, 3.4 cm thyroid nodule   03/07/13 EKG: SR, RBBB, LAFB, LAE, non-spec ST/T changes     Assessment and Plan   1. Chest pain - atypical, reproducible with palpation on exam. EKG in clinic without ischemic changes - no further cardiac workup at this time, recommend proceeding with her GI procedure as planned  2. Parox afib  - no significant symptoms - will continue current meds  3. HTN:  - at goal, we will continue current meds   4. HL:  - at goal, we will continue current statin.      Arnoldo Lenis, M.D.

## 2015-09-04 NOTE — Patient Instructions (Addendum)
Your physician wants you to follow-up in: 6 Months.  You will receive a reminder letter in the mail two months in advance. If you don't receive a letter, please call our office to schedule the follow-up appointment.  Your physician recommends that you continue on your current medications as directed. Please refer to the Current Medication list given to you today.  If you need a refill on your cardiac medications before your next appointment, please call your pharmacy.  Thank you for choosing [facility]!   

## 2015-09-05 NOTE — Telephone Encounter (Signed)
Forwarding to Eric Gill, NP and Ginger.  

## 2015-09-05 NOTE — Telephone Encounter (Signed)
Forwarding the note to Dr. Harl Bowie and Gentry Roch, RN:  Please advise if pt is cleared for the GI procedures.   Thanks so much!

## 2015-09-05 NOTE — Telephone Encounter (Signed)
Noted. Please follow-up on clearance when they have finished their workup. If ok to move forward we can schedule over the phone, hold Coumadin 3 days prior to procedure, no lovenox bridge.  Forwarding to [employer] and [employer]

## 2015-09-06 ENCOUNTER — Encounter: Payer: Self-pay | Admitting: Cardiology

## 2015-09-06 NOTE — Telephone Encounter (Signed)
Ok to proceed with colonscopy  Zandra Abts MD

## 2015-09-07 NOTE — Telephone Encounter (Signed)
Forwarding to Ginger to schedule.  

## 2015-09-08 ENCOUNTER — Other Ambulatory Visit: Payer: Self-pay | Admitting: Family Medicine

## 2015-09-10 ENCOUNTER — Other Ambulatory Visit: Payer: Self-pay

## 2015-09-10 ENCOUNTER — Ambulatory Visit (INDEPENDENT_AMBULATORY_CARE_PROVIDER_SITE_OTHER): Payer: Medicare Other | Admitting: *Deleted

## 2015-09-10 DIAGNOSIS — G459 Transient cerebral ischemic attack, unspecified: Secondary | ICD-10-CM | POA: Diagnosis not present

## 2015-09-10 DIAGNOSIS — Z5181 Encounter for therapeutic drug level monitoring: Secondary | ICD-10-CM

## 2015-09-10 DIAGNOSIS — I480 Paroxysmal atrial fibrillation: Secondary | ICD-10-CM | POA: Diagnosis not present

## 2015-09-10 DIAGNOSIS — Z1211 Encounter for screening for malignant neoplasm of colon: Secondary | ICD-10-CM

## 2015-09-10 LAB — POCT INR: INR: 2.4

## 2015-09-10 MED ORDER — NA SULFATE-K SULFATE-MG SULF 17.5-3.13-1.6 GM/177ML PO SOLN: 1.0000 | ORAL | Status: DC

## 2015-09-10 NOTE — Telephone Encounter (Signed)
Pt is set up for 10/08/15 @ 2:30. Instructions are going out in the mail today.

## 2015-09-13 NOTE — Telephone Encounter (Signed)
LMOM for a return call to make sure she is aware to hold her coumadin 3 days prior to procedure.

## 2015-09-17 ENCOUNTER — Other Ambulatory Visit: Payer: Self-pay | Admitting: *Deleted

## 2015-09-17 MED ORDER — WARFARIN SODIUM 5 MG PO TABS: ORAL_TABLET | ORAL | Status: DC

## 2015-09-20 DIAGNOSIS — Z1159 Encounter for screening for other viral diseases: Secondary | ICD-10-CM | POA: Diagnosis not present

## 2015-09-20 DIAGNOSIS — I10 Essential (primary) hypertension: Secondary | ICD-10-CM | POA: Diagnosis not present

## 2015-09-20 DIAGNOSIS — E559 Vitamin D deficiency, unspecified: Secondary | ICD-10-CM | POA: Diagnosis not present

## 2015-09-20 DIAGNOSIS — E119 Type 2 diabetes mellitus without complications: Secondary | ICD-10-CM | POA: Diagnosis not present

## 2015-09-26 ENCOUNTER — Other Ambulatory Visit: Payer: Self-pay | Admitting: Family Medicine

## 2015-09-27 ENCOUNTER — Other Ambulatory Visit: Payer: Self-pay

## 2015-09-27 ENCOUNTER — Ambulatory Visit (INDEPENDENT_AMBULATORY_CARE_PROVIDER_SITE_OTHER): Payer: Medicare Other | Admitting: Family Medicine

## 2015-09-27 ENCOUNTER — Encounter: Payer: Self-pay | Admitting: Family Medicine

## 2015-09-27 VITALS — BP 114/78 | HR 72 | Resp 16 | Ht 63.0 in | Wt 192.0 lb

## 2015-09-27 DIAGNOSIS — E119 Type 2 diabetes mellitus without complications: Secondary | ICD-10-CM | POA: Diagnosis not present

## 2015-09-27 DIAGNOSIS — Z0000 Encounter for general adult medical examination without abnormal findings: Secondary | ICD-10-CM

## 2015-09-27 DIAGNOSIS — E669 Obesity, unspecified: Secondary | ICD-10-CM | POA: Diagnosis not present

## 2015-09-27 DIAGNOSIS — I10 Essential (primary) hypertension: Secondary | ICD-10-CM

## 2015-09-27 DIAGNOSIS — E785 Hyperlipidemia, unspecified: Secondary | ICD-10-CM

## 2015-09-27 DIAGNOSIS — Z7689 Persons encountering health services in other specified circumstances: Secondary | ICD-10-CM | POA: Insufficient documentation

## 2015-09-27 DIAGNOSIS — Z1231 Encounter for screening mammogram for malignant neoplasm of breast: Secondary | ICD-10-CM

## 2015-09-27 DIAGNOSIS — E1169 Type 2 diabetes mellitus with other specified complication: Secondary | ICD-10-CM

## 2015-09-27 MED ORDER — DILTIAZEM HCL ER COATED BEADS 120 MG PO CP24: 120.0000 mg | ORAL_CAPSULE | Freq: Every day | ORAL | Status: DC

## 2015-09-27 MED ORDER — BENAZEPRIL HCL 20 MG PO TABS: 20.0000 mg | ORAL_TABLET | Freq: Every day | ORAL | Status: DC

## 2015-09-27 MED ORDER — METFORMIN HCL 500 MG PO TABS: 500.0000 mg | ORAL_TABLET | Freq: Two times a day (BID) | ORAL | Status: DC

## 2015-09-27 NOTE — Progress Notes (Signed)
Subjective:    Patient ID: Samantha Vega, female    DOB: 1950/07/04, 65 y.o.   MRN: [id number]  HPI Preventive Screening-Counseling & Management   Patient present here today for a Medicare annual wellness visit.   Current Problems (verified)   Medications Prior to Visit Allergies (verified)   PAST HISTORY  Family History (verified)   Social History married x 15 years, no children, retired Training and development officer, never smoker    Risk Factors  Current exercise habits:  Goes to [employer] when able   Dietary issues discussed: heart healthy, limit fat and carbs    Cardiac risk factors: type 2 dm   Depression Screen  (Note: if answer to either of the following is "Yes", a more complete depression screening is indicated)   Over the past two weeks, have you felt down, depressed or hopeless? No  Over the past two weeks, have you felt little interest or pleasure in doing things? No  Have you lost interest or pleasure in daily life? No  Do you often feel hopeless? No  Do you cry easily over simple problems? No   Activities of Daily Living  In your present state of health, do you have any difficulty performing the following activities?  Driving?: No Managing money?:yes, spouse does that, poor at arthrimetic Feeding yourself?:No Getting from bed to chair?:No Climbing a flight of stairs?:No Preparing food and eating?:No Bathing or showering?:No Getting dressed?:No Getting to the toilet?:No Using the toilet?:No Moving around from place to place?: No  Fall Risk Assessment In the past year have you fallen or had a near fall?:No Are you currently taking any medications that make you dizzy?:No   Hearing Difficulties: No Do you often ask people to speak up or repeat themselves?:No Do you experience ringing or noises in your ears?:No Do you have difficulty understanding soft or whispered voices?:No  Cognitive Testing  Alert? Yes Normal Appearance?Yes  Oriented to person? Yes Place? Yes    Time? Yes  Displays appropriate judgment?Yes  Can read the correct time from a watch face? yes Are you having problems remembering things?No  Advanced Directives have been discussed with the patient?Yes, brochure given , full code   List the Names of Other Physician/Practitioners you currently use:  Dr Harl Bowie (cardio)  Dr Oneida Alar (gi )  Indicate any recent Medical Services you may have received from other than [facility] providers in the past year (date may be approximate).   Assessment:    Annual Wellness Exam   Plan:    Medicare Attestation  I have personally reviewed:  The patient's medical and social history  Their use of alcohol, tobacco or illicit drugs  Their current medications and supplements  The patient's functional ability including ADLs,fall risks, home safety risks, cognitive, and hearing and visual impairment  Diet and physical activities  Evidence for depression or mood disorders  The patient's weight, height, BMI, and visual acuity have been recorded in the chart. I have made referrals, counseling, and provided education to the patient based on review of the above and I have provided the patient with a written personalized care plan for preventive services.     Review of Systems     Objective:   Physical Exam BP 114/78 mmHg  Pulse 72  Resp 16  Ht 5\' 3"  (1.6 m)  Wt 192 lb (87.091 kg)  BMI 34.02 kg/m2  SpO2 98%        Assessment & Plan:  Medicare annual wellness visit, subsequent Annual exam  as documented. Counseling done  re healthy lifestyle involving commitment to 150 minutes exercise per week, heart healthy diet, and attaining healthy weight.The importance of adequate sleep also discussed. Regular seat belt use and home safety, is also discussed. Changes in health habits are decided on by the patient with goals and time frames  set for achieving them. Immunization and cancer screening needs are specifically addressed at this visit.

## 2015-09-27 NOTE — Patient Instructions (Signed)
F/u end August or early September, call if you need me sooner.  You are referred for mammogram and for eye exam  Fasting lipid, cmp and EGFr and hBa1C for next visit  Please work on good  health habits so that your health will improve. 1. Commitment to daily physical activity for 30 to 60  minutes, if you are able to do this.  2. Commitment to wise food choices. Aim for half of your  food intake to be vegetable and fruit, one quarter starchy foods, and one quarter protein. Try to eat on a regular schedule  3 meals per day, snacking between meals should be limited to vegetables or fruits or small portions of nuts. 64 ounces of water per day is generally recommended, unless you have specific health conditions, like heart failure or kidney failure where you will need to limit fluid intake.  3. Commitment to sufficient and a  good quality of physical and mental rest daily, generally between 6 to 8 hours per day.  WITH PERSISTANCE AND PERSEVERANCE, THE IMPOSSIBLE , BECOMES THE NORM! Thank you  for choosing [facility]. We consider it a privelige to serve you.  Delivering excellent health care in a caring and  compassionate way is our goal.  Partnering with you,  so that together we can achieve this goal is our strategy.

## 2015-09-27 NOTE — Addendum Note (Signed)
Addended by: Eual Fines on: 09/27/2015 09:20 AM   Modules accepted: Orders

## 2015-09-27 NOTE — Assessment & Plan Note (Signed)

## 2015-09-27 NOTE — Addendum Note (Signed)
Addended by: Eual Fines on: 09/27/2015 09:17 AM   Modules accepted: Orders

## 2015-10-05 ENCOUNTER — Encounter: Payer: Self-pay | Admitting: Family Medicine

## 2015-10-08 ENCOUNTER — Encounter (HOSPITAL_COMMUNITY): Payer: Self-pay | Admitting: *Deleted

## 2015-10-08 ENCOUNTER — Encounter (HOSPITAL_COMMUNITY)
Admission: RE | Disposition: A | Discharge status: Home / Self Care | Payer: Self-pay | Source: Ambulatory Visit | Attending: Gastroenterology

## 2015-10-08 ENCOUNTER — Ambulatory Visit (HOSPITAL_COMMUNITY)
Admission: RE | Admit: 2015-10-08 | Discharge: 2015-10-08 | Disposition: A | Discharge status: Home / Self Care | Payer: Medicare Other | Source: Ambulatory Visit | Attending: Gastroenterology | Admitting: Gastroenterology

## 2015-10-08 DIAGNOSIS — Z1211 Encounter for screening for malignant neoplasm of colon: Secondary | ICD-10-CM

## 2015-10-08 DIAGNOSIS — D120 Benign neoplasm of cecum: Secondary | ICD-10-CM | POA: Diagnosis not present

## 2015-10-08 DIAGNOSIS — E785 Hyperlipidemia, unspecified: Secondary | ICD-10-CM | POA: Diagnosis not present

## 2015-10-08 DIAGNOSIS — Z7984 Long term (current) use of oral hypoglycemic drugs: Secondary | ICD-10-CM | POA: Diagnosis not present

## 2015-10-08 DIAGNOSIS — I10 Essential (primary) hypertension: Secondary | ICD-10-CM | POA: Insufficient documentation

## 2015-10-08 DIAGNOSIS — K644 Residual hemorrhoidal skin tags: Secondary | ICD-10-CM | POA: Diagnosis not present

## 2015-10-08 DIAGNOSIS — K5730 Diverticulosis of large intestine without perforation or abscess without bleeding: Secondary | ICD-10-CM | POA: Diagnosis not present

## 2015-10-08 DIAGNOSIS — Z7901 Long term (current) use of anticoagulants: Secondary | ICD-10-CM | POA: Insufficient documentation

## 2015-10-08 DIAGNOSIS — K648 Other hemorrhoids: Secondary | ICD-10-CM | POA: Diagnosis not present

## 2015-10-08 DIAGNOSIS — E119 Type 2 diabetes mellitus without complications: Secondary | ICD-10-CM | POA: Insufficient documentation

## 2015-10-08 DIAGNOSIS — Z79899 Other long term (current) drug therapy: Secondary | ICD-10-CM | POA: Diagnosis not present

## 2015-10-08 DIAGNOSIS — M1991 Primary osteoarthritis, unspecified site: Secondary | ICD-10-CM | POA: Insufficient documentation

## 2015-10-08 DIAGNOSIS — K621 Rectal polyp: Secondary | ICD-10-CM | POA: Diagnosis not present

## 2015-10-08 DIAGNOSIS — E669 Obesity, unspecified: Secondary | ICD-10-CM | POA: Insufficient documentation

## 2015-10-08 DIAGNOSIS — I480 Paroxysmal atrial fibrillation: Secondary | ICD-10-CM | POA: Insufficient documentation

## 2015-10-08 DIAGNOSIS — Z791 Long term (current) use of non-steroidal anti-inflammatories (NSAID): Secondary | ICD-10-CM | POA: Insufficient documentation

## 2015-10-08 DIAGNOSIS — I4510 Unspecified right bundle-branch block: Secondary | ICD-10-CM | POA: Insufficient documentation

## 2015-10-08 HISTORY — PX: COLONOSCOPY: SHX5424

## 2015-10-08 HISTORY — PX: POLYPECTOMY: SHX5525

## 2015-10-08 LAB — GLUCOSE, CAPILLARY: Glucose-Capillary: 92 mg/dL (ref 65–99)

## 2015-10-08 SURGERY — COLONOSCOPY
Anesthesia: Moderate Sedation

## 2015-10-08 MED ORDER — MEPERIDINE HCL 100 MG/ML IJ SOLN
INTRAMUSCULAR | Status: AC
  Filled 2015-10-08: qty 2

## 2015-10-08 MED ORDER — SODIUM CHLORIDE 0.9% FLUSH
INTRAVENOUS | Status: AC
  Filled 2015-10-08: qty 10

## 2015-10-08 MED ORDER — SIMETHICONE 40 MG/0.6ML PO SUSP
ORAL | Status: DC | PRN
  Administered 2015-10-08: 13:00:00

## 2015-10-08 MED ORDER — MIDAZOLAM HCL 5 MG/5ML IJ SOLN
INTRAMUSCULAR | Status: AC
  Filled 2015-10-08: qty 10

## 2015-10-08 MED ORDER — PROMETHAZINE HCL 25 MG/ML IJ SOLN
INTRAMUSCULAR | Status: AC
Start: 2015-10-08 — End: 2015-10-08
  Administered 2015-10-08: 12.5 mg
  Filled 2015-10-08: qty 1

## 2015-10-08 MED ORDER — MIDAZOLAM HCL 5 MG/5ML IJ SOLN
INTRAMUSCULAR | Status: DC | PRN
  Administered 2015-10-08 (×2): 2 mg via INTRAVENOUS
  Administered 2015-10-08: 1 mg via INTRAVENOUS

## 2015-10-08 MED ORDER — SODIUM CHLORIDE 0.9 % IV SOLN
INTRAVENOUS | Status: DC
  Administered 2015-10-08: 12:00:00 via INTRAVENOUS

## 2015-10-08 MED ORDER — MEPERIDINE HCL 100 MG/ML IJ SOLN
INTRAMUSCULAR | Status: DC | PRN
  Administered 2015-10-08 (×2): 25 mg via INTRAVENOUS

## 2015-10-08 NOTE — H&P (Signed)
Primary Care Physician:  Tula Nakayama, MD Primary Gastroenterologist:  Dr. Oneida Alar  Pre-Procedure History & Physical: HPI:  Samantha Vega is a 65 y.o. female here for [place].  Past Medical History  Diagnosis Date  . Right bundle branch block     +Palpitations.   EKG 11/2006:  NSR, RBBB, LAFB, markedly delayed R-wave progression, no change; Echocardiogram in 10/2006-suboptimal quality, normal; Event recorder-PVCs, no significant arrhythmias or symptoms ;  . Obesity   . Hyperlipidemia   . Hypertension   . Diabetes mellitus   . Chest pain     Noncardiac; evaluated and treated in 2008  . Enlarged heart   . Arthritis   . PAF (paroxysmal atrial fibrillation) [facility])     Past Surgical History  Procedure Laterality Date  . Total abdominal hysterectomy w/ bilateral salpingoophorectomy    . Shoulder surgery  2006    Left-post-trauma  . Abdominal hysterectomy    . Colonoscopy  2006    Dr. Tamala Julian: Normal, repeat in 2009.    Prior to Admission medications   Medication Sig Start Date End Date Taking? Authorizing Provider  atorvastatin (LIPITOR) 40 MG tablet TAKE 1 TABLET BY MOUTH EVERY DAY 07/16/15   Fayrene Helper, MD  benazepril (LOTENSIN) 20 MG tablet Take 1 tablet (20 mg total) by mouth daily. 09/27/15   Fayrene Helper, MD  calcium-vitamin D (OSCAL 500/200 D-3) 500 MG tablet Take 1 tablet by mouth 2 (two) times daily.     Historical Provider, MD  diclofenac sodium (VOLTAREN) 1 % GEL Apply 4 g topically 4 (four) times daily. 06/07/15   Fayrene Helper, MD  diltiazem (CARDIZEM CD) 120 MG 24 hr capsule TAKE ONE CAPSULE BY MOUTH EVERY DAY 09/27/15   Fayrene Helper, MD  diltiazem (CARDIZEM CD) 120 MG 24 hr capsule Take 1 capsule (120 mg total) by mouth daily. 09/27/15   Fayrene Helper, MD  glucose blood (ONE TOUCH ULTRA TEST) test strip USE ONCE DAILY dx E11.9 07/02/15   Fayrene Helper, MD  metFORMIN (GLUCOPHAGE) 500 MG tablet Take 1 tablet (500 mg  total) by mouth 2 (two) times daily with a meal. 09/27/15   Fayrene Helper, MD  metoprolol (LOPRESSOR) 100 MG tablet Take 1 tablet (100 mg total) by mouth 2 (two) times daily. 01/22/15   Arnoldo Lenis, MD  Multiple Vitamins-Minerals ([employer]S SPECTRAVITE PO) Take 1 tablet by mouth daily.     Historical Provider, MD  Na Sulfate-K Sulfate-Mg Sulf (SUPREP BOWEL PREP) SOLN Take 1 kit by mouth as directed. 09/10/15   Danie Binder, MD  traMADol (ULTRAM) 50 MG tablet TAKE 1 TABLET EVERY DAY AS NEEDED 06/07/15   Fayrene Helper, MD  warfarin (COUMADIN) 5 MG tablet Take 1 1/2 tablets daily or as directed.. 09/17/15   Arnoldo Lenis, MD    Allergies as of 09/10/2015  . (No Known Allergies)    Family History  Problem Relation Age of Onset  . Hypertension Mother     and sister  . Arthritis Mother   . Stroke Father   . Hypertension Father   . Coronary artery disease Father   . Diabetes Sister     x1  . Sarcoidosis Sister     x1  . Colon cancer Neg Hx   . Heart failure Brother     Social History   Social History  . Marital Status: Married    Spouse Name: N/A  . Number of Children: 0  .  Years of Education: N/A   Occupational History  . Engineer, materials Unemployed   Social History Main Topics  . Smoking status: Never Smoker   . Smokeless tobacco: Never Used  . Alcohol Use: No  . Drug Use: No  . Sexual Activity: Yes   Other Topics Concern  . Not on file   Social History Narrative    Review of Systems: See HPI, otherwise negative ROS   Physical Exam: There were no vitals taken for this visit. General:   Alert,  pleasant and cooperative in NAD Head:  Normocephalic and atraumatic. Neck:  Supple; Lungs:  Clear throughout to auscultation.    Heart:  Regular rate and rhythm. Abdomen:  Soft, nontender and nondistended. Normal bowel sounds, without guarding, and without rebound.   Neurologic:  Alert and  oriented x4;  grossly normal neurologically.  Impression/Plan:      SCREENING  Plan:  1. TCS TODAY

## 2015-10-08 NOTE — Progress Notes (Signed)
REVIEWED-NO ADDITIONAL RECOMMENDATIONS. 

## 2015-10-08 NOTE — Discharge Instructions (Signed)
You have Internal hemorrhoids and diverticulosis IN YOUR LEFT  COLON. YOU HAD TWO SMALL POLYP REMOVED.   NO MRI FOR 30 DAYS DUE TO METAL CLIP PLACEMENT IN THE COLON.  Re-start Coumadin APR 27.  FOLLOW A HIGH FIBER DIET. AVOID ITEMS THAT CAUSE BLOATING. See info below.  YOUR BIOPSY RESULTS WILL BE AVAILABLE IN MY CHART  Apr 26 and MY OFFICE WILL CONTACT YOU IN 10-14 DAYS WITH YOUR RESULTS.   Next colonoscopy in 5-10 years.  Colonoscopy Care After Read the instructions outlined below and refer to this sheet in the next week. These discharge instructions provide you with general information on caring for yourself after you leave the hospital. While your treatment has been planned according to the most current medical practices available, unavoidable complications occasionally occur. If you have any problems or questions after discharge, call DR. Azar South, [phone].  ACTIVITY  You may resume your regular activity, but move at a slower pace for the next 24 hours.   Take frequent rest periods for the next 24 hours.   Walking will help get rid of the air and reduce the bloated feeling in your belly (abdomen).   No driving for 24 hours (because of the medicine (anesthesia) used during the test).   You may shower.   Do not sign any important legal documents or operate any machinery for 24 hours (because of the anesthesia used during the test).    NUTRITION  Drink plenty of fluids.   You may resume your normal diet as instructed by your doctor.   Begin with a light meal and progress to your normal diet. Heavy or fried foods are harder to digest and may make you feel sick to your stomach (nauseated).   Avoid alcoholic beverages for 24 hours or as instructed.    MEDICATIONS  You may resume your normal medications.   WHAT YOU CAN EXPECT TODAY  Some feelings of bloating in the abdomen.   Passage of more gas than usual.   Spotting of blood in your stool or on the toilet  paper  .  IF YOU HAD POLYPS REMOVED DURING THE COLONOSCOPY:  Eat a soft diet IF YOU HAVE NAUSEA, BLOATING, ABDOMINAL PAIN, OR VOMITING.    FINDING OUT THE RESULTS OF YOUR TEST Not all test results are available during your visit. DR. Oneida Alar WILL CALL YOU WITHIN 7 DAYS OF YOUR PROCEDUE WITH YOUR RESULTS. Do not assume everything is normal if you have not heard from DR. Shadasia Oldfield IN ONE WEEK, CALL HER OFFICE AT [phone].  SEEK IMMEDIATE MEDICAL ATTENTION AND CALL THE OFFICE: [phone] IF:  You have more than a spotting of blood in your stool.   Your belly is swollen (abdominal distention).   You are nauseated or vomiting.   You have a temperature over 101F.   You have abdominal pain or discomfort that is severe or gets worse throughout the day.  High-Fiber Diet A high-fiber diet changes your normal diet to include more whole grains, legumes, fruits, and vegetables. Changes in the diet involve replacing refined carbohydrates with unrefined foods. The calorie level of the diet is essentially unchanged. The Dietary Reference Intake (recommended amount) for adult males is 38 grams per day. For adult females, it is 25 grams per day. Pregnant and lactating women should consume 28 grams of fiber per day. Fiber is the intact part of a plant that is not broken down during digestion. Functional fiber is fiber that has been isolated from the plant to  provide a beneficial effect in the body. PURPOSE  Increase stool bulk.   Ease and regulate bowel movements.   Lower cholesterol.  REDUCE RISK OF COLON CANCER  INDICATIONS THAT YOU NEED MORE FIBER  Constipation and hemorrhoids.   Uncomplicated diverticulosis (intestine condition) and irritable bowel syndrome.   Weight management.   As a protective measure against hardening of the arteries (atherosclerosis), diabetes, and cancer.   GUIDELINES FOR INCREASING FIBER IN THE DIET  Start adding fiber to the diet slowly. A gradual increase  of about 5 more grams (2 slices of whole-wheat bread, 2 servings of most fruits or vegetables, or 1 bowl of high-fiber cereal) per day is best. Too rapid an increase in fiber may result in constipation, flatulence, and bloating.   Drink enough water and fluids to keep your urine clear or pale yellow. Water, juice, or caffeine-free drinks are recommended. Not drinking enough fluid may cause constipation.   Eat a variety of high-fiber foods rather than one type of fiber.   Try to increase your intake of fiber through using high-fiber foods rather than fiber pills or supplements that contain small amounts of fiber.   The goal is to change the types of food eaten. Do not supplement your present diet with high-fiber foods, but replace foods in your present diet.   INCLUDE A VARIETY OF FIBER SOURCES  Replace refined and processed grains with whole grains, canned fruits with fresh fruits, and incorporate other fiber sources. White rice, white breads, and most bakery goods contain little or no fiber.   Brown whole-grain rice, buckwheat oats, and many fruits and vegetables are all good sources of fiber. These include: broccoli, Brussels sprouts, cabbage, cauliflower, beets, sweet potatoes, white potatoes (skin on), carrots, tomatoes, eggplant, squash, berries, fresh fruits, and dried fruits.   Cereals appear to be the richest source of fiber. Cereal fiber is found in whole grains and bran. Bran is the fiber-rich outer coat of cereal grain, which is largely removed in refining. In whole-grain cereals, the bran remains. In breakfast cereals, the largest amount of fiber is found in those with "bran" in their names. The fiber content is sometimes indicated on the label.   You may need to include additional fruits and vegetables each day.   In baking, for 1 cup white flour, you may use the following substitutions:   1 cup whole-wheat flour minus 2 tablespoons.   1/2 cup white flour plus 1/2 cup whole-wheat  flour.   Polyps, Colon  A polyp is extra tissue that grows inside your body. Colon polyps grow in the large intestine. The large intestine, also called the colon, is part of your digestive system. It is a long, hollow tube at the end of your digestive tract where your body makes and stores stool. Most polyps are not dangerous. They are benign. This means they are not cancerous. But over time, some types of polyps can turn into cancer. Polyps that are smaller than a pea are usually not harmful. But larger polyps could someday become or may already be cancerous. To be safe, doctors remove all polyps and test them.   PREVENTION There is not one sure way to prevent polyps. You might be able to lower your risk of getting them if you:  Eat more fruits and vegetables and less fatty food.   Do not smoke.   Avoid alcohol.   Exercise every day.   Lose weight if you are overweight.   Eating more calcium and folate  can also lower your risk of getting polyps. Some foods that are rich in calcium are milk, cheese, and broccoli. Some foods that are rich in folate are chickpeas, kidney beans, and spinach.    Diverticulosis Diverticulosis is a common condition that develops when small pouches (diverticula) form in the wall of the colon. The risk of diverticulosis increases with age. It happens more often in people who eat a low-fiber diet. Most individuals with diverticulosis have no symptoms. Those individuals with symptoms usually experience belly (abdominal) pain, constipation, or loose stools (diarrhea).  HOME CARE INSTRUCTIONS  Increase the amount of fiber in your diet as directed by your caregiver or dietician. This may reduce symptoms of diverticulosis.   Drink at least 6 to 8 glasses of water each day to prevent constipation.   Try not to strain when you have a bowel movement.   Avoiding nuts and seeds to prevent complications is NOT NECESSARY.   FOODS HAVING HIGH FIBER CONTENT  INCLUDE:  Fruits. Apple, peach, pear, tangerine, raisins, prunes.   Vegetables. Brussels sprouts, asparagus, broccoli, cabbage, carrot, cauliflower, romaine lettuce, spinach, summer squash, tomato, winter squash, zucchini.   Starchy Vegetables. Baked beans, kidney beans, lima beans, split peas, lentils, potatoes (with skin).   Grains. Whole wheat bread, brown rice, bran flake cereal, plain oatmeal, white rice, shredded wheat, bran muffins.    SEEK IMMEDIATE MEDICAL CARE IF:  You develop increasing pain or severe bloating.   You have an oral temperature above 101F.   You develop vomiting or bowel movements that are bloody or black.   Hemorrhoids Hemorrhoids are dilated (enlarged) veins around the rectum. Sometimes clots will form in the veins. This makes them swollen and painful. These are called thrombosed hemorrhoids. Causes of hemorrhoids include:  Constipation.   Straining to have a bowel movement.   HEAVY LIFTING  HOME CARE INSTRUCTIONS  Eat a well balanced diet and drink 6 to 8 glasses of water every day to avoid constipation. You may also use a bulk laxative.   Avoid straining to have bowel movements.   Keep anal area dry and clean.   Do not use a donut shaped pillow or sit on the toilet for long periods. This increases blood pooling and pain.   Move your bowels when your body has the urge; this will require less straining and will decrease pain and pressure.

## 2015-10-09 DIAGNOSIS — D126 Benign neoplasm of colon, unspecified: Secondary | ICD-10-CM | POA: Insufficient documentation

## 2015-10-09 NOTE — Op Note (Addendum)
[facility] Patient Name: Samantha Vega Procedure Date: 10/08/2015 1:04 PM MRN: [id number] Date of Birth: [birth year] Attending MD: Barney Drain , MD CSN: [id number] Age: 65 Admit Type: Outpatient Procedure:                Colonoscopy Indications:              Screening for colorectal malignant neoplasm Providers:                Barney Drain, MD, Renda Rolls, RN, Isabella Stalling,                            Technician Referring MD:             Kerin Perna, MD Medicines:                Promethazine 12.5 mg IV, Meperidine 50 mg IV,                            Midazolam 5 mg IV Complications:            No immediate complications. Estimated Blood Loss:     Estimated blood loss was minimal. Procedure:                Pre-Anesthesia Assessment:                           - Prior to the procedure, a History and Physical                            was performed, and patient medications and                            allergies were reviewed. The patient's tolerance of                            previous anesthesia was also reviewed. The risks                            and benefits of the procedure and the sedation                            options and risks were discussed with the patient.                            All questions were answered, and informed consent                            was obtained. Prior Anticoagulants: The patient has                            taken previous NSAID medication, last dose was day                            of procedure. ASA Grade Assessment: II - A patient  with mild systemic disease. After reviewing the                            risks and benefits, the patient was deemed in                            satisfactory condition to undergo the procedure.                           After obtaining informed consent, the colonoscope                            was passed under direct vision. Throughout the                             procedure, the patient's blood pressure, pulse, and                            oxygen saturations were monitored continuously. The                            [id number]) scope was introduced through                            the anus and advanced to the the cecum, identified                            by appendiceal orifice and ileocecal valve. The                            colonoscopy was somewhat difficult due to a                            tortuous colon. Successful completion of the                            procedure was aided by changing the patient to a                            supine position and using manual pressure. The                            ileocecal valve, appendiceal orifice, and rectum                            were photographed. The quality of the bowel                            preparation was excellent. Scope In: 1:19:45 PM Scope Out: 1:45:36 PM Scope Withdrawal Time: 0 hours 13 minutes 31 seconds  Total Procedure Duration: 0 hours 25 minutes 51 seconds  Findings:      The digital rectal exam findings include non-thrombosed external       hemorrhoids.  The colon (entire examined portion) revealed moderately excessive       looping. Advancing the scope required changing the patient to a supine       position and using manual pressure.      A 7 mm polyp was found in the cecum. The polyp was sessile. The polyp       was removed with a hot snare. Resection and retrieval were complete. ONE       RESOLUTION CLIP PLACED DUE TO POSTPOLYPECTOMY BLEEDING.      A 4 mm polyp was found in the rectum. The polyp was sessile. The polyp       was removed with a cold biopsy forceps. Resection and retrieval were       complete.      Multiple small and large-mouthed diverticula were found in the sigmoid       colon, descending colon and distal transverse colon.      Non-bleeding internal hemorrhoids were found. The hemorrhoids were small. Impression:                - Non-thrombosed external hemorrhoids found on                            digital rectal exam.                           - There was significant looping of the colon.                           - One 7 mm polyp in the cecum, removed with a hot                            snare. Resected and retrieved.                           - One 4 mm polyp in the rectum, removed with a cold                            biopsy forceps. Resected and retrieved.                           - Diverticulosis in the sigmoid colon, in the                            descending colon and in the distal transverse colon.                           - Non-bleeding internal hemorrhoids. Moderate Sedation:      Moderate (conscious) sedation was administered by the endoscopy nurse       and supervised by the endoscopist. The following parameters were       monitored: oxygen saturation, heart rate, blood pressure, and response       to care. Total physician intraservice time was 40 minutes. Recommendation:           - Patient has a contact number available for  emergencies. The signs and symptoms of potential                            delayed complications were discussed with the                            patient. Return to normal activities tomorrow.                            Written discharge instructions were provided to the                            patient.                           - High fiber diet.                           - Continue present medications BUT HOLD COUMADIN.                           - Await pathology results.                           - Repeat colonoscopy in 5-10 years for surveillance.                           - Resume Coumadin (warfarin) at prior dose APR 27.                           NO MRI FOR 30 DAYS Procedure Code(s):        --- Professional ---                           [phone], Colonoscopy, flexible; with removal of                            tumor(s), polyp(s),  or other lesion(s) by snare                            technique                           45380, 59, Colonoscopy, flexible; with biopsy,                            single or multiple                           99153, Moderate sedation services; each additional                            15 minutes intraservice time                           99153, Moderate sedation services; each additional  15 minutes intraservice time                           G0500, Moderate sedation services provided by the                            same physician or other qualified health care                            professional performing a gastrointestinal                            endoscopic service that sedation supports,                            requiring the presence of an independent trained                            observer to assist in the monitoring of the                            patient's level of consciousness and physiological                            status; initial 15 minutes of intra-service time;                            patient age 86 years or older (additional time may                            be reported with [phone], as appropriate) Diagnosis Code(s):        --- Professional ---                           Z12.11, Encounter for screening for malignant                            neoplasm of colon                           K64.4, Residual hemorrhoidal skin tags                           K64.8, Other hemorrhoids                           D12.0, Benign neoplasm of cecum                           K62.1, Rectal polyp                           K57.30, Diverticulosis of large intestine without                            perforation or abscess without bleeding CPT copyright 2016  American Medical [employer]. All rights reserved. The codes documented in this report are preliminary and upon coder review may  be revised to meet current compliance requirements. Barney Drain, MD Barney Drain, MD 10/09/2015 8:53:43 AM This report has been signed electronically. Number of Addenda: 0

## 2015-10-10 ENCOUNTER — Ambulatory Visit (INDEPENDENT_AMBULATORY_CARE_PROVIDER_SITE_OTHER): Payer: Medicare Other | Admitting: *Deleted

## 2015-10-10 ENCOUNTER — Telehealth: Payer: Self-pay

## 2015-10-10 DIAGNOSIS — I480 Paroxysmal atrial fibrillation: Secondary | ICD-10-CM

## 2015-10-10 DIAGNOSIS — Z5181 Encounter for therapeutic drug level monitoring: Secondary | ICD-10-CM | POA: Diagnosis not present

## 2015-10-10 DIAGNOSIS — G459 Transient cerebral ischemic attack, unspecified: Secondary | ICD-10-CM

## 2015-10-10 LAB — POCT INR: INR: 1.1

## 2015-10-10 NOTE — Telephone Encounter (Signed)
Pt is aware to restart coumadin tomorrow, 10/11/2015.

## 2015-10-10 NOTE — Telephone Encounter (Signed)
PT RECEIVED WRITTEN DISCHARGE INSTRUCTION AFTER ENDOSCOPY. PLEASE CAL PT AND REVIEW WITH HER.  "NO MRI FOR 30 DAYS DUE TO METAL CLIP PLACEMENT IN THE COLON.  Re-start Coumadin APR 27.  FOLLOW A HIGH FIBER DIET. AVOID ITEMS THAT CAUSE BLOATING. See info below.  YOUR BIOPSY RESULTS WILL BE AVAILABLE IN MY CHART  Apr 26 and MY OFFICE WILL CONTACT YOU IN 10-14 DAYS WITH YOUR RESULTS.   Next colonoscopy in 5-10 years."

## 2015-10-10 NOTE — Addendum Note (Signed)
Addended by: Margretta Sidle on: 10/10/2015 10:03 AM   Modules accepted: Orders, Medications

## 2015-10-10 NOTE — Telephone Encounter (Signed)
T/C from Samantha Vega who is filling in for Edrick Oh today at the cardiologist office, wanting to know what Dr. Oneida Alar told pt in reference to resuming coumadin after her procedure.  Per Dr. Oneida Alar note and also spoke to Dr. Oneida Alar who walked by during the conversation, pt was told to resume coumadin at normal dose on 10/11/2015.  Ivin Booty is aware and said she would let the pt know.

## 2015-10-10 NOTE — Telephone Encounter (Signed)
Spoke with pt and read to her the written discharge instructions from Dr Oneida Alar and pt states understanding

## 2015-10-11 ENCOUNTER — Encounter (HOSPITAL_COMMUNITY): Payer: Self-pay | Admitting: Gastroenterology

## 2015-10-15 ENCOUNTER — Encounter: Payer: Self-pay | Admitting: Family Medicine

## 2015-10-17 ENCOUNTER — Ambulatory Visit (INDEPENDENT_AMBULATORY_CARE_PROVIDER_SITE_OTHER): Payer: Medicare Other | Admitting: *Deleted

## 2015-10-17 DIAGNOSIS — G459 Transient cerebral ischemic attack, unspecified: Secondary | ICD-10-CM

## 2015-10-17 DIAGNOSIS — I480 Paroxysmal atrial fibrillation: Secondary | ICD-10-CM | POA: Diagnosis not present

## 2015-10-17 DIAGNOSIS — Z5181 Encounter for therapeutic drug level monitoring: Secondary | ICD-10-CM | POA: Diagnosis not present

## 2015-10-17 LAB — POCT INR: INR: 1.5

## 2015-10-22 ENCOUNTER — Other Ambulatory Visit: Payer: Self-pay | Admitting: Family Medicine

## 2015-10-24 ENCOUNTER — Ambulatory Visit (INDEPENDENT_AMBULATORY_CARE_PROVIDER_SITE_OTHER): Payer: Medicare Other | Admitting: *Deleted

## 2015-10-24 DIAGNOSIS — I480 Paroxysmal atrial fibrillation: Secondary | ICD-10-CM | POA: Diagnosis not present

## 2015-10-24 DIAGNOSIS — G459 Transient cerebral ischemic attack, unspecified: Secondary | ICD-10-CM | POA: Diagnosis not present

## 2015-10-24 DIAGNOSIS — Z5181 Encounter for therapeutic drug level monitoring: Secondary | ICD-10-CM

## 2015-10-24 LAB — POCT INR: INR: 3.7

## 2015-10-28 ENCOUNTER — Telehealth: Payer: Self-pay | Admitting: Gastroenterology

## 2015-10-28 NOTE — Telephone Encounter (Signed)
Please call pt. She had a simple adenoma AND ONE HYPERPLASTIC POLYP.    NO MRI UNTIL AFTER MAY 24 DUE TO METAL CLIP PLACEMENT IN THE COLON.  FOLLOW A HIGH FIBER DIET. AVOID ITEMS THAT CAUSE BLOATING.   Next colonoscopy in 5-10 years.

## 2015-10-29 NOTE — Telephone Encounter (Signed)
Pt is aware of results. 

## 2015-10-29 NOTE — Telephone Encounter (Signed)
Reminder in epic °

## 2015-11-01 ENCOUNTER — Telehealth: Payer: Self-pay | Admitting: Family Medicine

## 2015-11-01 ENCOUNTER — Ambulatory Visit (HOSPITAL_COMMUNITY)
Admission: RE | Admit: 2015-11-01 | Discharge: 2015-11-01 | Disposition: A | Discharge status: Home / Self Care | Payer: Medicare Other | Source: Ambulatory Visit | Attending: Family Medicine | Admitting: Family Medicine

## 2015-11-01 DIAGNOSIS — Z1231 Encounter for screening mammogram for malignant neoplasm of breast: Secondary | ICD-10-CM | POA: Diagnosis not present

## 2015-11-01 NOTE — Telephone Encounter (Signed)
Waiting for dr to write rx. Will fax

## 2015-11-01 NOTE — Telephone Encounter (Signed)
Patient is stating that it is time again for new diabetic shoes, new order/Rx needs to be sent to [facility], please advise?

## 2015-11-14 ENCOUNTER — Ambulatory Visit (INDEPENDENT_AMBULATORY_CARE_PROVIDER_SITE_OTHER): Payer: Medicare Other | Admitting: *Deleted

## 2015-11-14 DIAGNOSIS — Z5181 Encounter for therapeutic drug level monitoring: Secondary | ICD-10-CM

## 2015-11-14 DIAGNOSIS — G459 Transient cerebral ischemic attack, unspecified: Secondary | ICD-10-CM | POA: Diagnosis not present

## 2015-11-14 DIAGNOSIS — I480 Paroxysmal atrial fibrillation: Secondary | ICD-10-CM | POA: Diagnosis not present

## 2015-11-14 LAB — POCT INR: INR: 4.7

## 2015-11-21 ENCOUNTER — Ambulatory Visit (INDEPENDENT_AMBULATORY_CARE_PROVIDER_SITE_OTHER): Payer: Medicare Other | Admitting: *Deleted

## 2015-11-21 DIAGNOSIS — Z5181 Encounter for therapeutic drug level monitoring: Secondary | ICD-10-CM

## 2015-11-21 DIAGNOSIS — G459 Transient cerebral ischemic attack, unspecified: Secondary | ICD-10-CM | POA: Diagnosis not present

## 2015-11-21 DIAGNOSIS — I480 Paroxysmal atrial fibrillation: Secondary | ICD-10-CM | POA: Diagnosis not present

## 2015-11-21 LAB — POCT INR: INR: 3.2

## 2015-11-28 DIAGNOSIS — E119 Type 2 diabetes mellitus without complications: Secondary | ICD-10-CM | POA: Diagnosis not present

## 2015-11-28 DIAGNOSIS — H2513 Age-related nuclear cataract, bilateral: Secondary | ICD-10-CM | POA: Diagnosis not present

## 2015-11-28 DIAGNOSIS — H52203 Unspecified astigmatism, bilateral: Secondary | ICD-10-CM | POA: Diagnosis not present

## 2015-11-28 DIAGNOSIS — H524 Presbyopia: Secondary | ICD-10-CM | POA: Diagnosis not present

## 2015-11-28 LAB — HM DIABETES EYE EXAM

## 2015-12-04 ENCOUNTER — Other Ambulatory Visit: Payer: Self-pay | Admitting: Family Medicine

## 2015-12-04 DIAGNOSIS — E119 Type 2 diabetes mellitus without complications: Secondary | ICD-10-CM | POA: Diagnosis not present

## 2015-12-04 DIAGNOSIS — M2140 Flat foot [pes planus] (acquired), unspecified foot: Secondary | ICD-10-CM | POA: Diagnosis not present

## 2015-12-05 ENCOUNTER — Ambulatory Visit (INDEPENDENT_AMBULATORY_CARE_PROVIDER_SITE_OTHER): Payer: Medicare Other | Admitting: *Deleted

## 2015-12-05 DIAGNOSIS — G459 Transient cerebral ischemic attack, unspecified: Secondary | ICD-10-CM

## 2015-12-05 DIAGNOSIS — Z5181 Encounter for therapeutic drug level monitoring: Secondary | ICD-10-CM

## 2015-12-05 DIAGNOSIS — I480 Paroxysmal atrial fibrillation: Secondary | ICD-10-CM

## 2015-12-05 LAB — POCT INR: INR: 2.7

## 2015-12-10 ENCOUNTER — Other Ambulatory Visit: Payer: Self-pay | Admitting: Family Medicine

## 2015-12-11 ENCOUNTER — Telehealth: Payer: Self-pay | Admitting: Family Medicine

## 2015-12-11 MED ORDER — TRAMADOL HCL 50 MG PO TABS: 50.0000 mg | ORAL_TABLET | Freq: Every day | ORAL | Status: DC

## 2015-12-11 NOTE — Telephone Encounter (Signed)
Med refilled.

## 2015-12-11 NOTE — Telephone Encounter (Signed)
Samantha Vega is asking for a refill on her medication traMADol (ULTRAM) 50 MG tablet please advise?

## 2016-01-01 DIAGNOSIS — H2511 Age-related nuclear cataract, right eye: Secondary | ICD-10-CM | POA: Diagnosis not present

## 2016-01-01 DIAGNOSIS — H2513 Age-related nuclear cataract, bilateral: Secondary | ICD-10-CM | POA: Diagnosis not present

## 2016-01-01 NOTE — Patient Instructions (Signed)
Your procedure is scheduled on: 01/08/2016  Report to [facility] at  6  AM.  Call this number if you have problems the morning of surgery: [phone]   Do not eat food or drink liquids :After Midnight.      Take these medicines the morning of surgery with A SIP OF WATER: lotensin, diltiazem, metoprolol, ultram.   Do not wear jewelry, make-up or nail polish.  Do not wear lotions, powders, or perfumes. You may wear deodorant.  Do not shave 48 hours prior to surgery.  Do not bring valuables to the hospital.  Contacts, dentures or bridgework may not be worn into surgery.  Leave suitcase in the car. After surgery it may be brought to your room.  For patients admitted to the hospital, checkout time is 11:00 AM the day of discharge.   Patients discharged the day of surgery will not be allowed to drive home.  :     Please read over the following fact sheets that you were given: Coughing and Deep Breathing, Surgical Site Infection Prevention, Anesthesia Post-op Instructions and Care and Recovery After Surgery    Cataract A cataract is a clouding of the lens of the eye. When a lens becomes cloudy, vision is reduced based on the degree and nature of the clouding. Many cataracts reduce vision to some degree. Some cataracts make people more near-sighted as they develop. Other cataracts increase glare. Cataracts that are ignored and become worse can sometimes look white. The white color can be seen through the pupil. CAUSES   Aging. However, cataracts may occur at any age, even in newborns.   Certain drugs.   Trauma to the eye.   Certain diseases such as diabetes.   Specific eye diseases such as chronic inflammation inside the eye or a sudden attack of a rare form of glaucoma.   Inherited or acquired medical problems.  SYMPTOMS   Gradual, progressive drop in vision in the affected eye.   Severe, rapid visual loss. This most often happens when trauma is the cause.  DIAGNOSIS  To detect a  cataract, an eye doctor examines the lens. Cataracts are best diagnosed with an exam of the eyes with the pupils enlarged (dilated) by drops.  TREATMENT  For an early cataract, vision may improve by using different eyeglasses or stronger lighting. If that does not help your vision, surgery is the only effective treatment. A cataract needs to be surgically removed when vision loss interferes with your everyday activities, such as driving, reading, or watching TV. A cataract may also have to be removed if it prevents examination or treatment of another eye problem. Surgery removes the cloudy lens and usually replaces it with a substitute lens (intraocular lens, IOL).  At a time when both you and your doctor agree, the cataract will be surgically removed. If you have cataracts in both eyes, only one is usually removed at a time. This allows the operated eye to heal and be out of danger from any possible problems after surgery (such as infection or poor wound healing). In rare cases, a cataract may be doing damage to your eye. In these cases, your caregiver may advise surgical removal right away. The vast majority of people who have cataract surgery have better vision afterward. HOME CARE INSTRUCTIONS  If you are not planning surgery, you may be asked to do the following:  Use different eyeglasses.   Use stronger or brighter lighting.   Ask your eye doctor about reducing your  medicine dose or changing medicines if it is thought that a medicine caused your cataract. Changing medicines does not make the cataract go away on its own.   Become familiar with your surroundings. Poor vision can lead to injury. Avoid bumping into things on the affected side. You are at a higher risk for tripping or falling.   Exercise extreme care when driving or operating machinery.   Wear sunglasses if you are sensitive to bright light or experiencing problems with glare.  SEEK IMMEDIATE MEDICAL CARE IF:   You have a  worsening or sudden vision loss.   You notice redness, swelling, or increasing pain in the eye.   You have a fever.  Document Released: 06/02/2005 Document Revised: 05/22/2011 Document Reviewed: 01/24/2011 [facility] Patient Information 2012 [place].PATIENT INSTRUCTIONS POST-ANESTHESIA  IMMEDIATELY FOLLOWING SURGERY:  Do not drive or operate machinery for the first twenty four hours after surgery.  Do not make any important decisions for twenty four hours after surgery or while taking narcotic pain medications or sedatives.  If you develop intractable nausea and vomiting or a severe headache please notify your doctor immediately.  FOLLOW-UP:  Please make an appointment with your surgeon as instructed. You do not need to follow up with anesthesia unless specifically instructed to do so.  WOUND CARE INSTRUCTIONS (if applicable):  Keep a dry clean dressing on the anesthesia/puncture wound site if there is drainage.  Once the wound has quit draining you may leave it open to air.  Generally you should leave the bandage intact for twenty four hours unless there is drainage.  If the epidural site drains for more than 36-48 hours please call the anesthesia department.  QUESTIONS?:  Please feel free to call your physician or the hospital operator if you have any questions, and they will be happy to assist you.

## 2016-01-02 ENCOUNTER — Ambulatory Visit (INDEPENDENT_AMBULATORY_CARE_PROVIDER_SITE_OTHER): Payer: Medicare Other | Admitting: *Deleted

## 2016-01-02 DIAGNOSIS — G459 Transient cerebral ischemic attack, unspecified: Secondary | ICD-10-CM

## 2016-01-02 DIAGNOSIS — Z5181 Encounter for therapeutic drug level monitoring: Secondary | ICD-10-CM | POA: Diagnosis not present

## 2016-01-02 DIAGNOSIS — I480 Paroxysmal atrial fibrillation: Secondary | ICD-10-CM

## 2016-01-02 LAB — POCT INR: INR: 2.9

## 2016-01-03 ENCOUNTER — Encounter (HOSPITAL_COMMUNITY)
Admission: RE | Admit: 2016-01-03 | Discharge: 2016-01-03 | Disposition: A | Discharge status: Home / Self Care | Payer: Medicare Other | Source: Ambulatory Visit | Attending: Ophthalmology | Admitting: Ophthalmology

## 2016-01-03 ENCOUNTER — Encounter (HOSPITAL_COMMUNITY): Payer: Self-pay

## 2016-01-03 DIAGNOSIS — Z01812 Encounter for preprocedural laboratory examination: Secondary | ICD-10-CM | POA: Insufficient documentation

## 2016-01-03 LAB — CBC WITH DIFFERENTIAL/PLATELET
BASOS ABS: 0 10*3/uL (ref 0.0–0.1)
BASOS PCT: 1 %
EOS PCT: 3 %
Eosinophils Absolute: 0.2 10*3/uL (ref 0.0–0.7)
HCT: 34.4 % — ABNORMAL LOW (ref 36.0–46.0)
Hemoglobin: 11.3 g/dL — ABNORMAL LOW (ref 12.0–15.0)
Lymphocytes Relative: 40 %
Lymphs Abs: 2.3 10*3/uL (ref 0.7–4.0)
MCH: 28.3 pg (ref 26.0–34.0)
MCHC: 32.8 g/dL (ref 30.0–36.0)
MCV: 86 fL (ref 78.0–100.0)
MONO ABS: 0.5 10*3/uL (ref 0.1–1.0)
Monocytes Relative: 8 %
Neutro Abs: 2.8 10*3/uL (ref 1.7–7.7)
Neutrophils Relative %: 48 %
PLATELETS: 198 10*3/uL (ref 150–400)
RBC: 4 MIL/uL (ref 3.87–5.11)
RDW: 15 % (ref 11.5–15.5)
WBC: 5.7 10*3/uL (ref 4.0–10.5)

## 2016-01-03 LAB — BASIC METABOLIC PANEL
ANION GAP: 2 — AB (ref 5–15)
BUN: 16 mg/dL (ref 6–20)
CALCIUM: 9 mg/dL (ref 8.9–10.3)
CO2: 27 mmol/L (ref 22–32)
CREATININE: 0.68 mg/dL (ref 0.44–1.00)
Chloride: 108 mmol/L (ref 101–111)
GLUCOSE: 85 mg/dL (ref 65–99)
Potassium: 4.1 mmol/L (ref 3.5–5.1)
Sodium: 137 mmol/L (ref 135–145)

## 2016-01-03 NOTE — Pre-Procedure Instructions (Signed)
Patient given information to sign up for my chart at home. 

## 2016-01-05 ENCOUNTER — Other Ambulatory Visit: Payer: Self-pay | Admitting: Family Medicine

## 2016-01-08 ENCOUNTER — Ambulatory Visit (HOSPITAL_COMMUNITY)
Admission: RE | Admit: 2016-01-08 | Discharge: 2016-01-08 | Disposition: A | Discharge status: Home / Self Care | Payer: Medicare Other | Source: Ambulatory Visit | Attending: Ophthalmology | Admitting: Ophthalmology

## 2016-01-08 ENCOUNTER — Encounter (HOSPITAL_COMMUNITY): Payer: Self-pay | Admitting: *Deleted

## 2016-01-08 ENCOUNTER — Encounter (HOSPITAL_COMMUNITY)
Admission: RE | Disposition: A | Discharge status: Home / Self Care | Payer: Self-pay | Source: Ambulatory Visit | Attending: Ophthalmology

## 2016-01-08 ENCOUNTER — Ambulatory Visit (HOSPITAL_COMMUNITY): Payer: Medicare Other | Admitting: Anesthesiology

## 2016-01-08 DIAGNOSIS — I25119 Atherosclerotic heart disease of native coronary artery with unspecified angina pectoris: Secondary | ICD-10-CM | POA: Insufficient documentation

## 2016-01-08 DIAGNOSIS — E1136 Type 2 diabetes mellitus with diabetic cataract: Secondary | ICD-10-CM | POA: Diagnosis not present

## 2016-01-08 DIAGNOSIS — M1990 Unspecified osteoarthritis, unspecified site: Secondary | ICD-10-CM | POA: Insufficient documentation

## 2016-01-08 DIAGNOSIS — I4891 Unspecified atrial fibrillation: Secondary | ICD-10-CM | POA: Diagnosis not present

## 2016-01-08 DIAGNOSIS — H2511 Age-related nuclear cataract, right eye: Secondary | ICD-10-CM | POA: Diagnosis not present

## 2016-01-08 DIAGNOSIS — H269 Unspecified cataract: Secondary | ICD-10-CM | POA: Diagnosis not present

## 2016-01-08 HISTORY — PX: CATARACT EXTRACTION W/PHACO: SHX586

## 2016-01-08 LAB — GLUCOSE, CAPILLARY: Glucose-Capillary: 92 mg/dL (ref 65–99)

## 2016-01-08 SURGERY — PHACOEMULSIFICATION, CATARACT, WITH IOL INSERTION
Anesthesia: Monitor Anesthesia Care | Site: Eye | Laterality: Right

## 2016-01-08 MED ORDER — TETRACAINE 0.5 % OP SOLN OPTIME - NO CHARGE
OPHTHALMIC | Status: DC | PRN
  Administered 2016-01-08: 2 [drp] via OPHTHALMIC

## 2016-01-08 MED ORDER — TETRACAINE HCL 0.5 % OP SOLN
1.0000 [drp] | OPHTHALMIC | Status: AC
  Administered 2016-01-08 (×3): 1 [drp] via OPHTHALMIC

## 2016-01-08 MED ORDER — CYCLOPENTOLATE-PHENYLEPHRINE 0.2-1 % OP SOLN
1.0000 [drp] | OPHTHALMIC | Status: AC
  Administered 2016-01-08 (×3): 1 [drp] via OPHTHALMIC

## 2016-01-08 MED ORDER — PROVISC 10 MG/ML IO SOLN
INTRAOCULAR | Status: DC | PRN
  Administered 2016-01-08: 0.85 mL via INTRAOCULAR

## 2016-01-08 MED ORDER — PHENYLEPHRINE HCL 2.5 % OP SOLN
1.0000 [drp] | OPHTHALMIC | Status: AC
  Administered 2016-01-08 (×3): 1 [drp] via OPHTHALMIC

## 2016-01-08 MED ORDER — LACTATED RINGERS IV SOLN
INTRAVENOUS | Status: DC
  Administered 2016-01-08: 08:00:00 via INTRAVENOUS

## 2016-01-08 MED ORDER — EPINEPHRINE HCL 1 MG/ML IJ SOLN
INTRAMUSCULAR | Status: AC
  Filled 2016-01-08: qty 1

## 2016-01-08 MED ORDER — LIDOCAINE HCL (PF) 1 % IJ SOLN
INTRAMUSCULAR | Status: DC | PRN
  Administered 2016-01-08: .5 mL

## 2016-01-08 MED ORDER — EPINEPHRINE HCL 1 MG/ML IJ SOLN
INTRAOCULAR | Status: DC | PRN
  Administered 2016-01-08: 500 mL

## 2016-01-08 MED ORDER — MIDAZOLAM HCL 2 MG/2ML IJ SOLN
INTRAMUSCULAR | Status: AC
  Filled 2016-01-08: qty 2

## 2016-01-08 MED ORDER — FENTANYL CITRATE (PF) 100 MCG/2ML IJ SOLN
25.0000 ug | INTRAMUSCULAR | Status: AC | PRN
  Administered 2016-01-08 (×2): 25 ug via INTRAVENOUS

## 2016-01-08 MED ORDER — LIDOCAINE HCL (PF) 1 % IJ SOLN
INTRAMUSCULAR | Status: AC
  Filled 2016-01-08: qty 2

## 2016-01-08 MED ORDER — MIDAZOLAM HCL 2 MG/2ML IJ SOLN
1.0000 mg | INTRAMUSCULAR | Status: DC | PRN
  Administered 2016-01-08: 2 mg via INTRAVENOUS

## 2016-01-08 MED ORDER — BSS IO SOLN
INTRAOCULAR | Status: DC | PRN
  Administered 2016-01-08: 15 mL

## 2016-01-08 MED ORDER — KETOROLAC TROMETHAMINE 0.5 % OP SOLN
1.0000 [drp] | OPHTHALMIC | Status: AC
  Administered 2016-01-08 (×3): 1 [drp] via OPHTHALMIC

## 2016-01-08 MED ORDER — FENTANYL CITRATE (PF) 100 MCG/2ML IJ SOLN
INTRAMUSCULAR | Status: AC
  Filled 2016-01-08: qty 2

## 2016-01-08 SURGICAL SUPPLY — 10 items
CLOTH BEACON ORANGE TIMEOUT ST (SAFETY) ×2 IMPLANT
EYE SHIELD UNIVERSAL CLEAR (GAUZE/BANDAGES/DRESSINGS) ×2 IMPLANT
GLOVE BIO SURGEON STRL SZ 6.5 (GLOVE) ×2 IMPLANT
GLOVE EXAM NITRILE MD LF STRL (GLOVE) ×2 IMPLANT
LENS ALC ACRYL/TECN (Ophthalmic Related) ×2 IMPLANT
PAD ARMBOARD 7.5X6 YLW CONV (MISCELLANEOUS) ×2 IMPLANT
RING MALYGIN (MISCELLANEOUS) ×2 IMPLANT
TAPE SURG TRANSPORE 1 IN (GAUZE/BANDAGES/DRESSINGS) ×1 IMPLANT
TAPE SURGICAL TRANSPORE 1 IN (GAUZE/BANDAGES/DRESSINGS) ×1
WATER STERILE IRR 250ML POUR (IV SOLUTION) ×2 IMPLANT

## 2016-01-08 NOTE — Anesthesia Postprocedure Evaluation (Signed)
Anesthesia Post Note  Patient: Samantha Vega  Procedure(s) Performed: Procedure(s) (LRB): CATARACT EXTRACTION PHACO AND INTRAOCULAR LENS PLACEMENT (IOC) (Right)  Patient location during evaluation: Short Stay Level of consciousness: awake and alert Pain management: pain level controlled Vital Signs Assessment: post-procedure vital signs reviewed and stable Respiratory status: spontaneous breathing Cardiovascular status: stable Anesthetic complications: no    Last Vitals:  Vitals:   01/08/16 0753 01/08/16 0819  BP: 130/62 134/82  Pulse: 62 73  Resp: 20 (!) 24  Temp: 36.7 C 36.6 C    Last Pain:  Vitals:   01/08/16 0819  TempSrc: Oral                 Rickardo Brinegar

## 2016-01-08 NOTE — Discharge Instructions (Signed)
°  °          [facility] Instructions [place] [id number] [address]      1. Avoid closing eyes tightly. One often closes the eye tightly when laughing, talking, sneezing, coughing or if they feel irritated. At these times, you should be careful not to close your eyes tightly.  2. Instill eye drops as instructed. To instill drops in your eye, open it, look up and have someone gently pull the lower lid down and instill a couple of drops inside the lower lid.  3. Do not touch upper lid.  4. Take Advil or Tylenol for pain.  5. You may use either eye for near work, such as reading or sewing and you may watch television.  6. You may have your hair done at the beauty parlor at any time.  7. Wear dark glasses with or without your own glasses if you are in bright light.  8. Call our office at [phone] or [phone] if you have sharp pain in your eye or unusual symptoms.  9.  FOLLOW UP WITH DR. SHAPIRO TODAY IN HIS [place] OFFICE AT 2:45pm.    I have received a copy of the above instructions and will follow them.     IF YOU ARE IN IMMEDIATE DANGER CALL 911!  It is important for you to keep your follow-up appointment with your physician after discharge, OR, for you /your caregiver to make a follow-up appointment with your physician / medical provider after discharge.  Show these instructions to the next healthcare provider you see.    General Anesthesia, Adult, Care After Refer to this sheet in the next few weeks. These instructions provide you with information on caring for yourself after your procedure. Your health care provider may also give you more specific instructions. Your treatment has been planned according to current medical practices, but problems sometimes occur. Call your health care provider if you have any problems or questions after your procedure. WHAT TO EXPECT AFTER THE PROCEDURE After the procedure, it is typical to  experience:  Sleepiness.  Nausea and vomiting. HOME CARE INSTRUCTIONS  For the first 24 hours after general anesthesia:  Have a responsible person with you.  Do not drive a car. If you are alone, do not take public transportation.  Do not drink alcohol.  Do not take medicine that has not been prescribed by your health care provider.  Do not sign important papers or make important decisions.  You may resume a normal diet and activities as directed by your health care provider.  Change bandages (dressings) as directed.  If you have questions or problems that seem related to general anesthesia, call the hospital and ask for the anesthetist or anesthesiologist on call. SEEK MEDICAL CARE IF:  You have nausea and vomiting that continue the day after anesthesia.  You develop a rash. SEEK IMMEDIATE MEDICAL CARE IF:   You have difficulty breathing.  You have chest pain.  You have any allergic problems.   This information is not intended to replace advice given to you by your health care provider. Make sure you discuss any questions you have with your health care provider.

## 2016-01-08 NOTE — H&P (Signed)
The patient was re examined and there is no change in the patients condition since the original H and P. 

## 2016-01-08 NOTE — Anesthesia Procedure Notes (Signed)
Procedure Name: MAC Date/Time: 01/08/2016 8:00 AM Performed by: Vista Deck Pre-anesthesia Checklist: Patient identified, Emergency Drugs available, Suction available, Timeout performed and Patient being monitored Patient Re-evaluated:Patient Re-evaluated prior to inductionOxygen Delivery Method: Nasal Cannula

## 2016-01-08 NOTE — Anesthesia Preprocedure Evaluation (Signed)
Anesthesia Evaluation  Patient identified by MRN, date of birth, ID band Patient awake    Reviewed: Allergy & Precautions, NPO status , Patient's Chart, lab work & pertinent test results  Airway Mallampati: III  TM Distance: >3 FB     Dental  (+) Edentulous Upper, Edentulous Lower   Pulmonary neg pulmonary ROS,    breath sounds clear to auscultation       Cardiovascular hypertension, + angina + CAD  + dysrhythmias Atrial Fibrillation  Rhythm:Regular Rate:Normal     Neuro/Psych TIA   GI/Hepatic negative GI ROS,   Endo/Other  diabetes, Type 2Morbid obesity  Renal/GU      Musculoskeletal   Abdominal   Peds  Hematology   Anesthesia Other Findings   Reproductive/Obstetrics                             Anesthesia Physical Anesthesia Plan  ASA: III  Anesthesia Plan: MAC   Post-op Pain Management:    Induction: Intravenous  Airway Management Planned: Nasal Cannula  Additional Equipment:   Intra-op Plan:   Post-operative Plan:   Informed Consent: I have reviewed the patients History and Physical, chart, labs and discussed the procedure including the risks, benefits and alternatives for the proposed anesthesia with the patient or authorized representative who has indicated his/her understanding and acceptance.     Plan Discussed with:   Anesthesia Plan Comments:         Anesthesia Quick Evaluation

## 2016-01-08 NOTE — Addendum Note (Signed)
Addendum  created 01/08/16 0836 by Vista Deck, CRNA   Anesthesia Staff edited

## 2016-01-08 NOTE — Transfer of Care (Signed)
Immediate Anesthesia Transfer of Care Note  Patient: Samantha Vega  Procedure(s) Performed: Procedure(s) (LRB): CATARACT EXTRACTION PHACO AND INTRAOCULAR LENS PLACEMENT (IOC) (Right)  Patient Location: Shortstay  Anesthesia Type: MAC  Level of Consciousness: awake  Airway & Oxygen Therapy: Patient Spontanous Breathing   Post-op Assessment: Report given to PACU RN, Post -op Vital signs reviewed and stable and Patient moving all extremities  Post vital signs: Reviewed and stable  Complications: No apparent anesthesia complications

## 2016-01-08 NOTE — Op Note (Signed)
Patient brought to the operating room and prepped and draped in the usual manner.  Lid speculum inserted in right eye.  Stab incision made at the twelve o'clock position.  Provisc instilled in the anterior chamber. Due to a small pupil, a Malugyn Ring was inserted.  A 2.4 mm. Stab incision was made temporally.  An anterior capsulotomy was done with a bent 25 gauge needle.  The nucleus was hydrodissected.  The Phaco tip was inserted in the anterior chamber and the nucleus was emulsified.  CDE was 5.78.  The cortical material was then removed with the I and A tip.  Posterior capsule was the polished.  The anterior chamber was deepened with Provisc.  A 19.0 Diopter Alcon SN60WF IOL was then inserted in the capsular bag.  The Malugyn Ring was removed.  Provisc was then removed with the I and A tip.  The wound was then hydrated.  Patient sent to the Recovery Room in good condition with follow up in my office.  Pre Op: Nuclear Cataract OD Post Op: same Procedure: KPE with IOL OD

## 2016-01-14 ENCOUNTER — Encounter (HOSPITAL_COMMUNITY): Payer: Self-pay | Admitting: Ophthalmology

## 2016-01-23 ENCOUNTER — Other Ambulatory Visit: Payer: Self-pay | Admitting: Family Medicine

## 2016-01-27 ENCOUNTER — Other Ambulatory Visit: Payer: Self-pay | Admitting: Cardiology

## 2016-01-27 ENCOUNTER — Other Ambulatory Visit: Payer: Self-pay | Admitting: Family Medicine

## 2016-01-29 DIAGNOSIS — H2512 Age-related nuclear cataract, left eye: Secondary | ICD-10-CM | POA: Diagnosis not present

## 2016-01-30 ENCOUNTER — Ambulatory Visit (INDEPENDENT_AMBULATORY_CARE_PROVIDER_SITE_OTHER): Payer: Medicare Other | Admitting: *Deleted

## 2016-01-30 DIAGNOSIS — Z5181 Encounter for therapeutic drug level monitoring: Secondary | ICD-10-CM

## 2016-01-30 DIAGNOSIS — I480 Paroxysmal atrial fibrillation: Secondary | ICD-10-CM

## 2016-01-30 DIAGNOSIS — G459 Transient cerebral ischemic attack, unspecified: Secondary | ICD-10-CM

## 2016-01-30 LAB — POCT INR: INR: 2.1

## 2016-02-01 ENCOUNTER — Encounter (HOSPITAL_COMMUNITY)
Admission: RE | Admit: 2016-02-01 | Discharge: 2016-02-01 | Disposition: A | Discharge status: Home / Self Care | Payer: Medicare Other | Source: Ambulatory Visit | Attending: Ophthalmology | Admitting: Ophthalmology

## 2016-02-04 ENCOUNTER — Encounter (HOSPITAL_COMMUNITY): Payer: Self-pay | Admitting: Anesthesiology

## 2016-02-04 MED ORDER — CYCLOPENTOLATE-PHENYLEPHRINE 0.2-1 % OP SOLN
OPHTHALMIC | Status: AC
  Filled 2016-02-04: qty 2

## 2016-02-04 MED ORDER — PHENYLEPHRINE HCL 2.5 % OP SOLN
OPHTHALMIC | Status: AC
  Filled 2016-02-04: qty 15

## 2016-02-04 MED ORDER — KETOROLAC TROMETHAMINE 0.5 % OP SOLN
OPHTHALMIC | Status: AC
  Filled 2016-02-04: qty 5

## 2016-02-04 MED ORDER — TETRACAINE HCL 0.5 % OP SOLN
OPHTHALMIC | Status: AC
  Filled 2016-02-04: qty 4

## 2016-02-04 NOTE — Anesthesia Preprocedure Evaluation (Addendum)
Anesthesia Evaluation  Patient identified by MRN, date of birth, ID band Patient awake    Reviewed: Allergy & Precautions, NPO status , Patient's Chart, lab work & pertinent test results, reviewed documented beta blocker date and time   Airway Mallampati: II  TM Distance: >3 FB Neck ROM: Full    Dental  (+) Missing, Poor Dentition   Pulmonary neg pulmonary ROS,    Pulmonary exam normal breath sounds clear to auscultation       Cardiovascular hypertension, Pt. on medications and Pt. on home beta blockers + dysrhythmias Atrial Fibrillation  Rhythm:Regular Rate:Bradycardia  EKG-NSR with  RBBB + LAFB, antero-lateral and inferior infarct  Echo- 07/12/12 The cavity size was normal. There was mild basal hypertrophy of the septum. Systolic function was normal. The estimated ejection fraction was in the range of 55% to 60%. Wall motion was normal; there were noregional wall motion abnormalities.   Neuro/Psych TIAnegative psych ROS   GI/Hepatic   Endo/Other  diabetes, Well Controlled, Type 2, Oral Hypoglycemic AgentsObesity Hyperlipidemia  Renal/GU      Musculoskeletal  (+) Arthritis , Osteoarthritis,  Chronic thoracic back pain   Abdominal (+) + obese,   Peds  Hematology Chronic anticoagulation   Anesthesia Other Findings   Reproductive/Obstetrics                            Anesthesia Physical Anesthesia Plan  ASA: III  Anesthesia Plan: MAC   Post-op Pain Management:    Induction: Intravenous  Airway Management Planned: Natural Airway and Nasal Cannula  Additional Equipment:   Intra-op Plan:   Post-operative Plan:   Informed Consent: I have reviewed the patients History and Physical, chart, labs and discussed the procedure including the risks, benefits and alternatives for the proposed anesthesia with the patient or authorized representative who has indicated his/her understanding and  acceptance.   Dental advisory given  Plan Discussed with: Anesthesiologist, CRNA and Surgeon  Anesthesia Plan Comments:         Anesthesia Quick Evaluation

## 2016-02-05 ENCOUNTER — Encounter (HOSPITAL_COMMUNITY): Payer: Self-pay | Admitting: *Deleted

## 2016-02-05 ENCOUNTER — Ambulatory Visit (HOSPITAL_COMMUNITY): Payer: Medicare Other | Admitting: Anesthesiology

## 2016-02-05 ENCOUNTER — Encounter (HOSPITAL_COMMUNITY)
Admission: RE | Disposition: A | Discharge status: Home / Self Care | Payer: Self-pay | Source: Ambulatory Visit | Attending: Ophthalmology

## 2016-02-05 ENCOUNTER — Ambulatory Visit (HOSPITAL_COMMUNITY)
Admission: RE | Admit: 2016-02-05 | Discharge: 2016-02-05 | Disposition: A | Discharge status: Home / Self Care | Payer: Medicare Other | Source: Ambulatory Visit | Attending: Ophthalmology | Admitting: Ophthalmology

## 2016-02-05 DIAGNOSIS — M1990 Unspecified osteoarthritis, unspecified site: Secondary | ICD-10-CM | POA: Insufficient documentation

## 2016-02-05 DIAGNOSIS — I4891 Unspecified atrial fibrillation: Secondary | ICD-10-CM | POA: Diagnosis not present

## 2016-02-05 DIAGNOSIS — Z7984 Long term (current) use of oral hypoglycemic drugs: Secondary | ICD-10-CM | POA: Diagnosis not present

## 2016-02-05 DIAGNOSIS — H269 Unspecified cataract: Secondary | ICD-10-CM | POA: Insufficient documentation

## 2016-02-05 DIAGNOSIS — E119 Type 2 diabetes mellitus without complications: Secondary | ICD-10-CM | POA: Diagnosis not present

## 2016-02-05 DIAGNOSIS — I4510 Unspecified right bundle-branch block: Secondary | ICD-10-CM | POA: Insufficient documentation

## 2016-02-05 DIAGNOSIS — E669 Obesity, unspecified: Secondary | ICD-10-CM | POA: Diagnosis not present

## 2016-02-05 DIAGNOSIS — I10 Essential (primary) hypertension: Secondary | ICD-10-CM | POA: Insufficient documentation

## 2016-02-05 DIAGNOSIS — E785 Hyperlipidemia, unspecified: Secondary | ICD-10-CM | POA: Diagnosis not present

## 2016-02-05 DIAGNOSIS — H2512 Age-related nuclear cataract, left eye: Secondary | ICD-10-CM | POA: Diagnosis not present

## 2016-02-05 HISTORY — PX: CATARACT EXTRACTION W/PHACO: SHX586

## 2016-02-05 LAB — LIPID PANEL: LDL Cholesterol: 71 mg/dL

## 2016-02-05 LAB — GLUCOSE, CAPILLARY: Glucose-Capillary: 102 mg/dL — ABNORMAL HIGH (ref 65–99)

## 2016-02-05 LAB — HEMOGLOBIN A1C: Hemoglobin A1C: 6

## 2016-02-05 SURGERY — PHACOEMULSIFICATION, CATARACT, WITH IOL INSERTION
Anesthesia: Monitor Anesthesia Care | Laterality: Left

## 2016-02-05 MED ORDER — EPINEPHRINE HCL 1 MG/ML IJ SOLN
INTRAMUSCULAR | Status: AC
  Filled 2016-02-05: qty 1

## 2016-02-05 MED ORDER — CYCLOPENTOLATE-PHENYLEPHRINE 0.2-1 % OP SOLN
1.0000 [drp] | OPHTHALMIC | Status: AC
  Administered 2016-02-05 (×3): 1 [drp] via OPHTHALMIC

## 2016-02-05 MED ORDER — MIDAZOLAM HCL 2 MG/2ML IJ SOLN
INTRAMUSCULAR | Status: AC
  Filled 2016-02-05: qty 2

## 2016-02-05 MED ORDER — PHENYLEPHRINE HCL 2.5 % OP SOLN
1.0000 [drp] | OPHTHALMIC | Status: AC
  Administered 2016-02-05 (×3): 1 [drp] via OPHTHALMIC

## 2016-02-05 MED ORDER — BSS IO SOLN
INTRAOCULAR | Status: DC | PRN
  Administered 2016-02-05: 15 mL

## 2016-02-05 MED ORDER — LIDOCAINE HCL (PF) 1 % IJ SOLN
INTRAMUSCULAR | Status: AC
  Filled 2016-02-05: qty 2

## 2016-02-05 MED ORDER — PROVISC 10 MG/ML IO SOLN
INTRAOCULAR | Status: DC | PRN
  Administered 2016-02-05: 0.85 mL via INTRAOCULAR

## 2016-02-05 MED ORDER — EPINEPHRINE HCL 1 MG/ML IJ SOLN
INTRAOCULAR | Status: DC | PRN
  Administered 2016-02-05: 500 mL

## 2016-02-05 MED ORDER — KETOROLAC TROMETHAMINE 0.5 % OP SOLN
1.0000 [drp] | OPHTHALMIC | Status: AC
  Administered 2016-02-05 (×3): 1 [drp] via OPHTHALMIC

## 2016-02-05 MED ORDER — LACTATED RINGERS IV SOLN
INTRAVENOUS | Status: DC
  Administered 2016-02-05: 08:00:00 via INTRAVENOUS

## 2016-02-05 MED ORDER — LIDOCAINE HCL (PF) 1 % IJ SOLN
INTRAMUSCULAR | Status: DC | PRN
  Administered 2016-02-05: .5 mL

## 2016-02-05 MED ORDER — TETRACAINE HCL 0.5 % OP SOLN
1.0000 [drp] | OPHTHALMIC | Status: AC
  Administered 2016-02-05 (×3): 1 [drp] via OPHTHALMIC

## 2016-02-05 MED ORDER — TETRACAINE 0.5 % OP SOLN OPTIME - NO CHARGE
OPHTHALMIC | Status: DC | PRN
  Administered 2016-02-05: 2 [drp] via OPHTHALMIC

## 2016-02-05 SURGICAL SUPPLY — 12 items
CLOTH BEACON ORANGE TIMEOUT ST (SAFETY) ×2 IMPLANT
EYE SHIELD UNIVERSAL CLEAR (GAUZE/BANDAGES/DRESSINGS) ×2 IMPLANT
GLOVE BIO SURGEON STRL SZ 6.5 (GLOVE) ×2 IMPLANT
GLOVE BIOGEL PI IND STRL 7.0 (GLOVE) ×1 IMPLANT
GLOVE BIOGEL PI INDICATOR 7.0 (GLOVE) ×1
GLOVE EXAM NITRILE MD LF STRL (GLOVE) ×2 IMPLANT
LENS ALC ACRYL/TECN (Ophthalmic Related) ×2 IMPLANT
PAD ARMBOARD 7.5X6 YLW CONV (MISCELLANEOUS) ×2 IMPLANT
RING MALYGIN (MISCELLANEOUS) ×2 IMPLANT
TAPE SURG TRANSPORE 1 IN (GAUZE/BANDAGES/DRESSINGS) ×1 IMPLANT
TAPE SURGICAL TRANSPORE 1 IN (GAUZE/BANDAGES/DRESSINGS) ×1
WATER STERILE IRR 250ML POUR (IV SOLUTION) ×2 IMPLANT

## 2016-02-05 NOTE — Discharge Instructions (Signed)
°  °          [facility] Instructions °[address]- [place] [address] °    ° °1. Avoid closing eyes tightly. One often closes the eye tightly when laughing, talking, sneezing, coughing or if they feel irritated. At these times, you should be careful not to close your eyes tightly. ° °2. Instill eye drops as instructed. To instill drops in your eye, open it, look up and have someone gently pull the lower lid down and instill a couple of drops inside the lower lid. ° °3. Do not touch upper lid. ° °4. Take Advil or Tylenol for pain. ° °5. You may use either eye for near work, such as reading or sewing and you may watch television. ° °6. You may have your hair done at the beauty parlor at any time. ° °7. Wear dark glasses with or without your own glasses if you are in bright light. ° °8. Call our office at [phone] or [phone] if you have sharp pain in your eye or unusual symptoms. ° °9.  FOLLOW UP WITH DR. SHAPIRO TODAY IN HIS [place] OFFICE AT 2:45pm. ° °  °I have received a copy of the above instructions and will follow them.  ° ° ° °IF YOU ARE IN IMMEDIATE DANGER CALL 911! ° °It is important for you to keep your follow-up appointment with your physician after discharge, OR, for you /your caregiver to make a follow-up appointment with your physician / medical provider after discharge. ° °Show these instructions to the next healthcare provider you see. ° °

## 2016-02-05 NOTE — H&P (Signed)
The patient was re examined and there is no change in the patients condition since the original H and P. 

## 2016-02-05 NOTE — Op Note (Signed)
Patient brought to the operating room and prepped and draped in the usual manner.  Lid speculum inserted in left eye.  Stab incision made at the twelve o'clock position.  Provisc instilled in the anterior chamber.   A 2.4 mm. Stab incision was made temporally.  An anterior capsulotomy was done with a bent 25 gauge needle.  The nucleus was hydrodissected.  The Phaco tip was inserted in the anterior chamber and the nucleus was emulsified.  CDE was 6.50.  The cortical material was then removed with the I and A tip.  Posterior capsule was the polished.  The anterior chamber was deepened with Provisc.  A 20.5 Diopter Alcon SN60WF IOL was then inserted in the capsular bag.  Provisc was then removed with the I and A tip.  The wound was then hydrated.  Patient sent to the Recovery Room in good condition with follow up in my office.  Preoperative Diagnosis:  Nuclear Cataract OS Postoperative Diagnosis:  Same Procedure name: Kelman Phacoemulsification OS with IOL

## 2016-02-05 NOTE — Anesthesia Procedure Notes (Signed)
Procedure Name: MAC Date/Time: 02/05/2016 7:49 AM Performed by: Vista Deck Pre-anesthesia Checklist: Patient identified, Emergency Drugs available, Suction available, Timeout performed and Patient being monitored Patient Re-evaluated:Patient Re-evaluated prior to inductionOxygen Delivery Method: Nasal Cannula

## 2016-02-05 NOTE — Anesthesia Postprocedure Evaluation (Signed)
Anesthesia Post Note  Patient: [name]  Procedure(s) Performed: Procedure(s) (LRB): CATARACT EXTRACTION PHACO AND INTRAOCULAR LENS PLACEMENT LEFT EYE CDE=6.50 (Left)  Anesthesia Post Evaluation  Last Vitals:  Vitals:   02/05/16 0715  Pulse: (!) 57  Resp: 18  Temp: 36.7 C    Last Pain:  Vitals:   02/05/16 0715  TempSrc: Oral                 Elidia Bonenfant     Anesthesia Post-op Note  Patient: [name]  Procedure(s) Performed: Procedure(s) (LRB): CATARACT EXTRACTION PHACO AND INTRAOCULAR LENS PLACEMENT LEFT EYE CDE=6.50 (Left)  Patient Location:  Short Stay  Anesthesia Type: MAC  Level of Consciousness: awake  Airway and Oxygen Therapy: Patient Spontanous Breathing  Post-op Pain: none  Post-op Assessment: Post-op Vital signs reviewed, Patient's Cardiovascular Status Stable, Respiratory Function Stable, Patent Airway, No signs of Nausea or vomiting and Pain level controlled  Post-op Vital Signs: Reviewed and stable  Complications: No apparent anesthesia complications

## 2016-02-05 NOTE — Transfer of Care (Signed)
Immediate Anesthesia Transfer of Care Note  Patient: Samantha Vega  Procedure(s) Performed: Procedure(s): CATARACT EXTRACTION PHACO AND INTRAOCULAR LENS PLACEMENT LEFT EYE CDE=6.50 (Left)  Immediate Anesthesia Transfer of Care Note  Patient: Samantha Vega  Procedure(s) Performed: Procedure(s) (LRB): CATARACT EXTRACTION PHACO AND INTRAOCULAR LENS PLACEMENT LEFT EYE CDE=6.50 (Left)  Patient Location: Shortstay  Anesthesia Type: MAC  Level of Consciousness: awake  Airway & Oxygen Therapy: Patient Spontanous Breathing   Post-op Assessment: Report given to PACU RN, Post -op Vital signs reviewed and stable and Patient moving all extremities  Post vital signs: Reviewed and stable  Complications: No apparent anesthesia complications   Last Vitals:  Vitals:   02/05/16 0715  Pulse: (!) 57  Resp: 18  Temp: 36.7 C    Last Pain:  Vitals:   02/05/16 0715  TempSrc: Oral      Patients Stated Pain Goal: 5 (02/05/16 0715)

## 2016-02-05 NOTE — Anesthesia Postprocedure Evaluation (Signed)
Anesthesia Post Note  Patient: Samantha Vega  Procedure(s) Performed: Procedure(s) (LRB): CATARACT EXTRACTION PHACO AND INTRAOCULAR LENS PLACEMENT LEFT EYE CDE=6.50 (Left)  Patient location during evaluation: PACU Anesthesia Type: MAC Level of consciousness: awake and alert and oriented Pain management: pain level controlled Vital Signs Assessment: post-procedure vital signs reviewed and stable Respiratory status: spontaneous breathing, nonlabored ventilation and respiratory function stable Cardiovascular status: stable and blood pressure returned to baseline Postop Assessment: no signs of nausea or vomiting Anesthetic complications: no    Last Vitals:  Vitals:   02/05/16 0715 02/05/16 0811  BP:  115/61  Pulse: (!) 57 63  Resp: 18 18  Temp: 36.7 C 36.6 C    Last Pain:  Vitals:   02/05/16 0811  TempSrc: Oral                 Nimisha Rathel A.

## 2016-02-08 ENCOUNTER — Encounter (HOSPITAL_COMMUNITY): Payer: Self-pay | Admitting: Ophthalmology

## 2016-02-12 ENCOUNTER — Ambulatory Visit (INDEPENDENT_AMBULATORY_CARE_PROVIDER_SITE_OTHER): Payer: Medicare Other | Admitting: Family Medicine

## 2016-02-12 ENCOUNTER — Encounter: Payer: Self-pay | Admitting: Family Medicine

## 2016-02-12 ENCOUNTER — Other Ambulatory Visit (HOSPITAL_COMMUNITY)
Admission: RE | Admit: 2016-02-12 | Discharge: 2016-02-12 | Disposition: A | Discharge status: Home / Self Care | Payer: Medicare Other | Source: Ambulatory Visit | Attending: Family Medicine | Admitting: Family Medicine

## 2016-02-12 VITALS — BP 120/82 | HR 71 | Resp 16 | Ht 63.0 in | Wt 191.0 lb

## 2016-02-12 DIAGNOSIS — E669 Obesity, unspecified: Secondary | ICD-10-CM | POA: Insufficient documentation

## 2016-02-12 DIAGNOSIS — E119 Type 2 diabetes mellitus without complications: Secondary | ICD-10-CM | POA: Diagnosis not present

## 2016-02-12 DIAGNOSIS — E785 Hyperlipidemia, unspecified: Secondary | ICD-10-CM | POA: Diagnosis not present

## 2016-02-12 DIAGNOSIS — Z23 Encounter for immunization: Secondary | ICD-10-CM

## 2016-02-12 DIAGNOSIS — I10 Essential (primary) hypertension: Secondary | ICD-10-CM

## 2016-02-12 DIAGNOSIS — E1169 Type 2 diabetes mellitus with other specified complication: Secondary | ICD-10-CM

## 2016-02-12 MED ORDER — METFORMIN HCL 500 MG PO TABS: 500.0000 mg | ORAL_TABLET | Freq: Two times a day (BID) | ORAL | 1 refills | Status: DC

## 2016-02-12 MED ORDER — TRAMADOL HCL 50 MG PO TABS: 50.0000 mg | ORAL_TABLET | Freq: Every day | ORAL | 3 refills | Status: DC

## 2016-02-12 NOTE — Progress Notes (Signed)
Samantha Vega     MRN: [id number]      DOB: [birth year]   HPI Samantha Vega is here for follow up and re-evaluation of chronic medical conditions, medication management and review of any available recent lab and radiology data.  Preventive health is updated, specifically  Cancer screening and Immunization.   Questions or concerns regarding consultations or procedures which the PT has had in the interim are  addressed. The PT denies any adverse reactions to current medications since the last visit.  There are no new concerns.  There are no specific complaints  Denies polyuria, polydipsia, blurred vision , or hypoglycemic episodes.   ROS Denies recent fever or chills. Denies sinus pressure, nasal congestion, ear pain or sore throat. Denies chest congestion, productive cough or wheezing. Denies chest pains, palpitations and leg swelling Denies abdominal pain, nausea, vomiting,diarrhea or constipation.   Denies dysuria, frequency, hesitancy or incontinence. Denies joint pain, swelling and limitation in mobility. Denies headaches, seizures, numbness, or tingling. Denies depression, anxiety or insomnia. Denies skin break down or rash.   PE  BP 120/82   Pulse 71   Resp 16   Ht 5\' 3"  (1.6 m)   Wt 191 lb (86.6 kg)   SpO2 97%   BMI 33.83 kg/m   Patient alert and oriented and in no cardiopulmonary distress.  HEENT: No facial asymmetry, EOMI,   oropharynx pink and moist.  Neck supple no JVD, no mass.  Chest: Clear to auscultation bilaterally.  CVS: S1, S2 no murmurs, no S3.Regular rate.  ABD: Soft non tender.   Ext: No edema  MS: Adequate ROM spine, shoulders, hips and knees.  Skin: Intact, no ulcerations or rash noted.  Psych: Good eye contact, normal affect. Memory intact not anxious or depressed appearing.  CNS: CN 2-12 intact, power,  normal throughout.no focal deficits noted.   Assessment & Plan  Hyperlipidemia Controlled, no change in medication, needs to  increase physical activityHyperlipidemia:Low fat diet discussed and encouraged.   Lipid Panel  Lab Results  Component Value Date   CHOL 138 12/27/2014   HDL 34 (L) 12/27/2014   LDLCALC 87 12/27/2014   TRIG 86 12/27/2014   CHOLHDL 4.1 12/27/2014   Ned to inc exercise    Need for 23-polyvalent pneumococcal polysaccharide vaccine After obtaining informed consent, the vaccine is  administered by LPN.   Need for prophylactic vaccination and inoculation against influenza After obtaining informed consent, the vaccine is  administered by LPN.   Diabetes mellitus type 2 in obese Controlled, no change in medication Samantha Vega is reminded of the importance of commitment to daily physical activity for 30 minutes or more, as able and the need to limit carbohydrate intake to 30 to 60 grams per meal to help with blood sugar control.   The need to take medication as prescribed, test blood sugar as directed, and to call between visits if there is a concern that blood sugar is uncontrolled is also discussed.   Samantha Vega is reminded of the importance of daily foot exam, annual eye examination, and good blood sugar, blood pressure and cholesterol control.  Diabetic Labs Latest Ref Rng & Units 01/03/2016 12/27/2014 07/27/2014 05/30/2014 01/10/2014  HbA1c <5.7 % - 5.9(H) 6.0(H) 5.9(H) 6.0(H)  Microalbumin <2.0 mg/dL - 0.6 - 2.8(H) -  Micro/Creat Ratio 0.0 - 30.0 mg/g - 3.5 - 12.3 -  Chol 0 - 200 mg/dL - 138 - 165 141  HDL >=46 mg/dL - 34(L) - 37(L) 32(L)  Calc  LDL 0 - 99 mg/dL - 87 - 111(H) 90  Triglycerides <150 mg/dL - 86 - 84 97  Creatinine 0.44 - 1.00 mg/dL 0.68 0.67 0.57 0.57 0.70   BP/Weight 02/12/2016 02/05/2016 01/08/2016 01/03/2016 10/08/2015 09/27/2015 Q000111Q  Systolic BP 123456 [id number] Q000111Q 123456 99 [id number]  Diastolic BP 82 61 82 58 58 78 66  Wt. (Lbs) 191 - - 193 - 192 189  BMI 33.83 - - 34.2 - 34.02 33.49   Foot/eye exam completion dates Latest Ref Rng & Units 11/28/2015 06/07/2015  Eye Exam  No Retinopathy No Retinopathy -  Foot Form Completion - - Done        Hypertension Controlled, no change in medication DASH diet and commitment to daily physical activity for a minimum of 30 minutes discussed and encouraged, as a part of hypertension management. The importance of attaining a healthy weight is also discussed.  BP/Weight 02/12/2016 02/05/2016 01/08/2016 01/03/2016 10/08/2015 09/27/2015 Q000111Q  Systolic BP 123456 [id number] Q000111Q 123456 99 [id number]  Diastolic BP 82 61 82 58 58 78 66  Wt. (Lbs) 191 - - 193 - 192 189  BMI 33.83 - - 34.2 - 34.02 33.49       Obesity Improved . Patient re-educated about  the importance of commitment to a  minimum of 150 minutes of exercise per week.  The importance of healthy food choices with portion control discussed. Encouraged to start a food diary, count calories and to consider  joining a support group. Sample diet sheets offered. Goals set by the patient for the next several months.   Weight /BMI 02/12/2016 01/03/2016 09/27/2015  WEIGHT 191 lb 193 lb 192 lb  HEIGHT 5\' 3"  5\' 3"  5\' 3"   BMI 33.83 kg/m2 34.2 kg/m2 34.02 kg/m2

## 2016-02-12 NOTE — Assessment & Plan Note (Signed)
After obtaining informed consent, the vaccine is  administered by LPN.  

## 2016-02-12 NOTE — Assessment & Plan Note (Addendum)
Controlled, no change in medication, needs to increase physical activityHyperlipidemia:Low fat diet discussed and encouraged.   Lipid Panel  Lab Results  Component Value Date   CHOL 138 12/27/2014   HDL 34 (L) 12/27/2014   LDLCALC 87 12/27/2014   TRIG 86 12/27/2014   CHOLHDL 4.1 12/27/2014   Ned to inc exercise

## 2016-02-12 NOTE — Patient Instructions (Addendum)
Annual physical exam 12/23  Or after, call if you need me before  Excellent labs, need to increase exercise commitment to 30 minutes every day, good cholesterol is low  Flu vaccine and pneumonia 23 in office tooday  Please work on good  health habits so that your health will improve. 1. Commitment to daily physical activity for 30 to 60  minutes, if you are able to do this.  2. Commitment to wise food choices. Aim for half of your  food intake to be vegetable and fruit, one quarter starchy foods, and one quarter protein. Try to eat on a regular schedule  3 meals per day, snacking between meals should be limited to vegetables or fruits or small portions of nuts. 64 ounces of water per day is generally recommended, unless you have specific health conditions, like heart failure or kidney failure where you will need to limit fluid intake.  3. Commitment to sufficient and a  good quality of physical and mental rest daily, generally between 6 to 8 hours per day.  WITH PERSISTANCE AND PERSEVERANCE, THE IMPOSSIBLE , BECOMES THE NORM!

## 2016-02-12 NOTE — Assessment & Plan Note (Signed)
Controlled, no change in medication Samantha Vega is reminded of the importance of commitment to daily physical activity for 30 minutes or more, as able and the need to limit carbohydrate intake to 30 to 60 grams per meal to help with blood sugar control.   The need to take medication as prescribed, test blood sugar as directed, and to call between visits if there is a concern that blood sugar is uncontrolled is also discussed.   Samantha Vega is reminded of the importance of daily foot exam, annual eye examination, and good blood sugar, blood pressure and cholesterol control.  Diabetic Labs Latest Ref Rng & Units 01/03/2016 12/27/2014 07/27/2014 05/30/2014 01/10/2014  HbA1c <5.7 % - 5.9(H) 6.0(H) 5.9(H) 6.0(H)  Microalbumin <2.0 mg/dL - 0.6 - 2.8(H) -  Micro/Creat Ratio 0.0 - 30.0 mg/g - 3.5 - 12.3 -  Chol 0 - 200 mg/dL - 138 - 165 141  HDL >=46 mg/dL - 34(L) - 37(L) 32(L)  Calc LDL 0 - 99 mg/dL - 87 - 111(H) 90  Triglycerides <150 mg/dL - 86 - 84 97  Creatinine 0.44 - 1.00 mg/dL 0.68 0.67 0.57 0.57 0.70   BP/Weight 02/12/2016 02/05/2016 01/08/2016 01/03/2016 10/08/2015 09/27/2015 Q000111Q  Systolic BP 123456 [id number] Q000111Q 123456 99 [id number]  Diastolic BP 82 61 82 58 58 78 66  Wt. (Lbs) 191 - - 193 - 192 189  BMI 33.83 - - 34.2 - 34.02 33.49   Foot/eye exam completion dates Latest Ref Rng & Units 11/28/2015 06/07/2015  Eye Exam No Retinopathy No Retinopathy -  Foot Form Completion - - Done

## 2016-02-12 NOTE — Assessment & Plan Note (Signed)
Controlled, no change in medication DASH diet and commitment to daily physical activity for a minimum of 30 minutes discussed and encouraged, as a part of hypertension management. The importance of attaining a healthy weight is also discussed.  BP/Weight 02/12/2016 02/05/2016 01/08/2016 01/03/2016 10/08/2015 09/27/2015 Q000111Q  Systolic BP 123456 [id number] Q000111Q 123456 99 [id number]  Diastolic BP 82 61 82 58 58 78 66  Wt. (Lbs) 191 - - 193 - 192 189  BMI 33.83 - - 34.2 - 34.02 33.49

## 2016-02-12 NOTE — Assessment & Plan Note (Addendum)
Improved . Patient re-educated about  the importance of commitment to a  minimum of 150 minutes of exercise per week.  The importance of healthy food choices with portion control discussed. Encouraged to start a food diary, count calories and to consider  joining a support group. Sample diet sheets offered. Goals set by the patient for the next several months.   Weight /BMI 02/12/2016 01/03/2016 09/27/2015  WEIGHT 191 lb 193 lb 192 lb  HEIGHT 5\' 3"  5\' 3"  5\' 3"   BMI 33.83 kg/m2 34.2 kg/m2 34.02 kg/m2

## 2016-02-13 LAB — MICROALBUMIN / CREATININE URINE RATIO
CREATININE, UR: 176.1 mg/dL
MICROALB/CREAT RATIO: 7.6 mg/g{creat} (ref 0.0–30.0)
Microalb, Ur: 13.4 ug/mL — ABNORMAL HIGH

## 2016-02-26 ENCOUNTER — Other Ambulatory Visit: Payer: Self-pay | Admitting: Cardiology

## 2016-03-12 ENCOUNTER — Ambulatory Visit (INDEPENDENT_AMBULATORY_CARE_PROVIDER_SITE_OTHER): Payer: Medicare Other | Admitting: *Deleted

## 2016-03-12 DIAGNOSIS — Z5181 Encounter for therapeutic drug level monitoring: Secondary | ICD-10-CM

## 2016-03-12 DIAGNOSIS — G459 Transient cerebral ischemic attack, unspecified: Secondary | ICD-10-CM

## 2016-03-12 DIAGNOSIS — I480 Paroxysmal atrial fibrillation: Secondary | ICD-10-CM

## 2016-03-12 LAB — POCT INR: INR: 1.8

## 2016-04-02 ENCOUNTER — Ambulatory Visit (INDEPENDENT_AMBULATORY_CARE_PROVIDER_SITE_OTHER): Payer: Medicare Other | Admitting: *Deleted

## 2016-04-02 DIAGNOSIS — G459 Transient cerebral ischemic attack, unspecified: Secondary | ICD-10-CM | POA: Diagnosis not present

## 2016-04-02 DIAGNOSIS — I480 Paroxysmal atrial fibrillation: Secondary | ICD-10-CM | POA: Diagnosis not present

## 2016-04-02 DIAGNOSIS — Z5181 Encounter for therapeutic drug level monitoring: Secondary | ICD-10-CM | POA: Diagnosis not present

## 2016-04-02 LAB — POCT INR: INR: 3

## 2016-04-09 ENCOUNTER — Other Ambulatory Visit (INDEPENDENT_AMBULATORY_CARE_PROVIDER_SITE_OTHER): Payer: Self-pay | Admitting: Otolaryngology

## 2016-04-09 DIAGNOSIS — E041 Nontoxic single thyroid nodule: Secondary | ICD-10-CM

## 2016-04-17 ENCOUNTER — Ambulatory Visit (HOSPITAL_COMMUNITY)
Admission: RE | Admit: 2016-04-17 | Discharge: 2016-04-17 | Disposition: A | Discharge status: Home / Self Care | Payer: Medicare Other | Source: Ambulatory Visit | Attending: Otolaryngology | Admitting: Otolaryngology

## 2016-04-17 DIAGNOSIS — E041 Nontoxic single thyroid nodule: Secondary | ICD-10-CM

## 2016-04-18 ENCOUNTER — Other Ambulatory Visit: Payer: Self-pay

## 2016-04-18 MED ORDER — DILTIAZEM HCL ER COATED BEADS 120 MG PO CP24: 120.0000 mg | ORAL_CAPSULE | Freq: Every day | ORAL | 0 refills | Status: DC

## 2016-04-18 MED ORDER — BENAZEPRIL HCL 20 MG PO TABS: 20.0000 mg | ORAL_TABLET | Freq: Every day | ORAL | 0 refills | Status: DC

## 2016-04-18 MED ORDER — ATORVASTATIN CALCIUM 40 MG PO TABS: 40.0000 mg | ORAL_TABLET | Freq: Every day | ORAL | 0 refills | Status: DC

## 2016-04-25 ENCOUNTER — Other Ambulatory Visit: Payer: Self-pay

## 2016-04-25 MED ORDER — ONETOUCH DELICA LANCETS 33G MISC: 3 refills | Status: DC

## 2016-04-30 ENCOUNTER — Ambulatory Visit (INDEPENDENT_AMBULATORY_CARE_PROVIDER_SITE_OTHER): Payer: Medicare Other | Admitting: *Deleted

## 2016-04-30 DIAGNOSIS — G459 Transient cerebral ischemic attack, unspecified: Secondary | ICD-10-CM

## 2016-04-30 DIAGNOSIS — I480 Paroxysmal atrial fibrillation: Secondary | ICD-10-CM | POA: Diagnosis not present

## 2016-04-30 DIAGNOSIS — Z5181 Encounter for therapeutic drug level monitoring: Secondary | ICD-10-CM | POA: Diagnosis not present

## 2016-04-30 LAB — POCT INR: INR: 1.6

## 2016-05-01 ENCOUNTER — Ambulatory Visit (INDEPENDENT_AMBULATORY_CARE_PROVIDER_SITE_OTHER): Payer: Medicare Other | Admitting: Otolaryngology

## 2016-05-01 DIAGNOSIS — D440 Neoplasm of uncertain behavior of thyroid gland: Secondary | ICD-10-CM

## 2016-05-17 ENCOUNTER — Other Ambulatory Visit: Payer: Self-pay | Admitting: Family Medicine

## 2016-05-21 ENCOUNTER — Ambulatory Visit (INDEPENDENT_AMBULATORY_CARE_PROVIDER_SITE_OTHER): Payer: Medicare Other | Admitting: *Deleted

## 2016-05-21 DIAGNOSIS — Z5181 Encounter for therapeutic drug level monitoring: Secondary | ICD-10-CM

## 2016-05-21 DIAGNOSIS — I480 Paroxysmal atrial fibrillation: Secondary | ICD-10-CM

## 2016-05-21 DIAGNOSIS — G459 Transient cerebral ischemic attack, unspecified: Secondary | ICD-10-CM | POA: Diagnosis not present

## 2016-05-21 LAB — POCT INR: INR: 2.8

## 2016-06-08 ENCOUNTER — Other Ambulatory Visit: Payer: Self-pay | Admitting: Family Medicine

## 2016-06-11 ENCOUNTER — Ambulatory Visit (INDEPENDENT_AMBULATORY_CARE_PROVIDER_SITE_OTHER): Payer: Medicare Other | Admitting: Family Medicine

## 2016-06-11 ENCOUNTER — Encounter: Payer: Self-pay | Admitting: Family Medicine

## 2016-06-11 ENCOUNTER — Ambulatory Visit (INDEPENDENT_AMBULATORY_CARE_PROVIDER_SITE_OTHER): Payer: Medicare Other | Admitting: *Deleted

## 2016-06-11 VITALS — BP 114/62 | HR 62 | Resp 16 | Ht 63.0 in | Wt 190.0 lb

## 2016-06-11 DIAGNOSIS — Z1211 Encounter for screening for malignant neoplasm of colon: Secondary | ICD-10-CM | POA: Diagnosis not present

## 2016-06-11 DIAGNOSIS — E1169 Type 2 diabetes mellitus with other specified complication: Secondary | ICD-10-CM

## 2016-06-11 DIAGNOSIS — E785 Hyperlipidemia, unspecified: Secondary | ICD-10-CM

## 2016-06-11 DIAGNOSIS — I480 Paroxysmal atrial fibrillation: Secondary | ICD-10-CM

## 2016-06-11 DIAGNOSIS — Z5181 Encounter for therapeutic drug level monitoring: Secondary | ICD-10-CM | POA: Diagnosis not present

## 2016-06-11 DIAGNOSIS — M1712 Unilateral primary osteoarthritis, left knee: Secondary | ICD-10-CM

## 2016-06-11 DIAGNOSIS — G459 Transient cerebral ischemic attack, unspecified: Secondary | ICD-10-CM

## 2016-06-11 DIAGNOSIS — E559 Vitamin D deficiency, unspecified: Secondary | ICD-10-CM | POA: Diagnosis not present

## 2016-06-11 DIAGNOSIS — E669 Obesity, unspecified: Secondary | ICD-10-CM

## 2016-06-11 DIAGNOSIS — D539 Nutritional anemia, unspecified: Secondary | ICD-10-CM

## 2016-06-11 DIAGNOSIS — Z0000 Encounter for general adult medical examination without abnormal findings: Secondary | ICD-10-CM | POA: Diagnosis not present

## 2016-06-11 LAB — HEMOCCULT GUIAC POC 1CARD (OFFICE): Fecal Occult Blood, POC: NEGATIVE

## 2016-06-11 LAB — HEMOGLOBIN A1C: HEMOGLOBIN A1C: 5.9

## 2016-06-11 LAB — LIPID PANEL: LDL Cholesterol: 94 mg/dL

## 2016-06-11 LAB — POCT INR: INR: 2.4

## 2016-06-11 MED ORDER — DILTIAZEM HCL ER COATED BEADS 120 MG PO CP24: 120.0000 mg | ORAL_CAPSULE | Freq: Every day | ORAL | 1 refills | Status: DC

## 2016-06-11 MED ORDER — BENAZEPRIL HCL 20 MG PO TABS: 20.0000 mg | ORAL_TABLET | Freq: Every day | ORAL | 1 refills | Status: DC

## 2016-06-11 MED ORDER — ATORVASTATIN CALCIUM 40 MG PO TABS: 40.0000 mg | ORAL_TABLET | Freq: Every day | ORAL | 1 refills | Status: DC

## 2016-06-11 NOTE — Assessment & Plan Note (Signed)

## 2016-06-11 NOTE — Progress Notes (Signed)
Samantha Vega     MRN: [id number]      DOB: 05/22/1951  HPI: Patient is in for annual physical exam. No other health concerns are expressed or addressed at the visit. Recent labs, if available are reviewed. Immunization is reviewed , and  updated if needed.   PE: Pleasant  female, alert and oriented x 3, in no cardio-pulmonary distress. Afebrile. HEENT No facial trauma or asymetry. Sinuses non tender.  Extra occullar muscles intact, pupils equally reactive to light. External ears normal, tympanic membranes clear. Oropharynx moist, no exudate. Neck: supple, no adenopathy,JVD or thyromegaly.No bruits.  Chest: Clear to ascultation bilaterally.No crackles or wheezes. Non tender to palpation  Breast: No asymetry,no masses or lumps. No tenderness. No nipple discharge or inversion. No axillary or supraclavicular adenopathy  Cardiovascular system; Heart sounds normal,  S1 and  S2 ,no S3.  No murmur, or thrill. Apical beat not displaced Peripheral pulses normal.  Abdomen: Soft, non tender, no organomegaly or masses. No bruits. Bowel sounds normal. No guarding, tenderness or rebound.  Rectal:  Normal sphincter tone. No rectal mass. Guaiac negative stool.  GU: External genitalia normal female genitalia , normal female distribution of hair. No lesions. Urethral meatus normal in size, no  Prolapse, no lesions visibly  Present. Bladder non tender. Vagina pink and moist , with no visible lesions , discharge present . Adequate pelvic support no  cystocele or rectocele noted  Uterus absent, no adnexal masses, no adnexal tenderness  Musculoskeletal exam: Full ROM of spine, hips , shoulders and knees. No deformity ,swelling or crepitus noted. No muscle wasting or atrophy.   Neurologic: Cranial nerves 2 to 12 intact. Power, tone ,sensation and reflexes normal throughout. No disturbance in gait. No tremor.  Skin: Intact, no ulceration, erythema , scaling or rash  noted. Pigmentation normal throughout  Psych; Normal mood and affect. Judgement and concentration normal   Assessment & Plan:  Annual physical exam Annual exam as documented. Counseling done  re healthy lifestyle involving commitment to 150 minutes exercise per week, heart healthy diet, and attaining healthy weight.The importance of adequate sleep also discussed. Regular seat belt use and home safety, is also discussed. Changes in health habits are decided on by the patient with goals and time frames  set for achieving them. Immunization and cancer screening needs are specifically addressed at this visit.   Diabetes mellitus type 2 in obese Samantha Vega is reminded of the importance of commitment to daily physical activity for 30 minutes or more, as able and the need to limit carbohydrate intake to 30 to 60 grams per meal to help with blood sugar control.   The need to take medication as prescribed, test blood sugar as directed, and to call between visits if there is a concern that blood sugar is uncontrolled is also discussed.   Samantha Vega is reminded of the importance of daily foot exam, annual eye examination, and good blood sugar, blood pressure and cholesterol control.  Diabetic Labs Latest Ref Rng & Units 02/12/2016 02/05/2016 01/03/2016 12/27/2014 07/27/2014  HbA1c - - 6.0 - 5.9(H) 6.0(H)  Microalbumin Not Estab. ug/mL 13.4(H) - - 0.6 -  Micro/Creat Ratio 0.0 - 30.0 mg/g creat 7.6 - - 3.5 -  Chol 0 - 200 mg/dL - - - 138 -  HDL >=46 mg/dL - - - 34(L) -  Calc LDL mg/dL - 71 - 87 -  Triglycerides <150 mg/dL - - - 86 -  Creatinine 0.44 - 1.00 mg/dL - -  0.68 0.67 0.57   BP/Weight 06/11/2016 02/12/2016 02/05/2016 01/08/2016 01/03/2016 10/08/2015 123456  Systolic BP 99991111 123456 [id number] Q000111Q 123456 99 [id number]  Diastolic BP 62 82 61 82 58 58 78  Wt. (Lbs) 190 191 - - 193 - 192  BMI 33.66 33.83 - - 34.2 - 34.02   Foot/eye exam completion dates Latest Ref Rng & Units 11/28/2015 06/07/2015  Eye Exam No  Retinopathy No Retinopathy -  Foot Form Completion - - Done      Updated lab needed at/ before next visit.

## 2016-06-11 NOTE — Assessment & Plan Note (Signed)
Samantha Vega is reminded of the importance of commitment to daily physical activity for 30 minutes or more, as able and the need to limit carbohydrate intake to 30 to 60 grams per meal to help with blood sugar control.   The need to take medication as prescribed, test blood sugar as directed, and to call between visits if there is a concern that blood sugar is uncontrolled is also discussed.   Samantha Vega is reminded of the importance of daily foot exam, annual eye examination, and good blood sugar, blood pressure and cholesterol control.  Diabetic Labs Latest Ref Rng & Units 02/12/2016 02/05/2016 01/03/2016 12/27/2014 07/27/2014  HbA1c - - 6.0 - 5.9(H) 6.0(H)  Microalbumin Not Estab. ug/mL 13.4(H) - - 0.6 -  Micro/Creat Ratio 0.0 - 30.0 mg/g creat 7.6 - - 3.5 -  Chol 0 - 200 mg/dL - - - 138 -  HDL >=46 mg/dL - - - 34(L) -  Calc LDL mg/dL - 71 - 87 -  Triglycerides <150 mg/dL - - - 86 -  Creatinine 0.44 - 1.00 mg/dL - - 0.68 0.67 0.57   BP/Weight 06/11/2016 02/12/2016 02/05/2016 01/08/2016 01/03/2016 10/08/2015 123456  Systolic BP 99991111 123456 [id number] Q000111Q 123456 99 [id number]  Diastolic BP 62 82 61 82 58 58 78  Wt. (Lbs) 190 191 - - 193 - 192  BMI 33.66 33.83 - - 34.2 - 34.02   Foot/eye exam completion dates Latest Ref Rng & Units 11/28/2015 06/07/2015  Eye Exam No Retinopathy No Retinopathy -  Foot Form Completion - - Done      Updated lab needed at/ before next visit.

## 2016-06-11 NOTE — Patient Instructions (Addendum)
Annual wellness End April, call if you need me sooner  MD follow up in 6 months  Use ES tylenol, one to two tablets when needed , for left knee pain  Doing very well , no need for handicap sticker!  Labs today, CBC, fasting lipid, cmp and eGFR, hBA1c, Vit D, tSH, iron and ferritin  Please work on good  health habits so that your health will improve. 1. Commitment to daily physical activity for 30 to 60  minutes, if you are able to do this.  2. Commitment to wise food choices. Aim for half of your  food intake to be vegetable and fruit, one quarter starchy foods, and one quarter protein. Try to eat on a regular schedule  3 meals per day, snacking between meals should be limited to vegetables or fruits or small portions of nuts. 64 ounces of water per day is generally recommended, unless you have specific health conditions, like heart failure or kidney failure where you will need to limit fluid intake.  3. Commitment to sufficient and a  good quality of physical and mental rest daily, generally between 6 to 8 hours per day.  WITH PERSISTANCE AND PERSEVERANCE, THE IMPOSSIBLE , BECOMES THE NORM! It is important that you exercise regularly at least 30 minutes 5 times a week. If you develop chest pain, have severe difficulty breathing, or feel very tired, stop exercising immediately and seek medical attention

## 2016-06-13 ENCOUNTER — Other Ambulatory Visit: Payer: Self-pay | Admitting: Family Medicine

## 2016-06-13 ENCOUNTER — Telehealth: Payer: Self-pay | Admitting: Family Medicine

## 2016-06-13 NOTE — Progress Notes (Signed)
Metformin 

## 2016-06-13 NOTE — Telephone Encounter (Signed)
pls let pt know labs are reviewed. Needfs to reduce metformin to one daily, enteredd, pls fax in and advise rx after you spealk with her. Needs to exercise more regularly to inc hDL Pls enter LDL and hBA1C, lab sheet is in your box, thanks

## 2016-06-17 ENCOUNTER — Other Ambulatory Visit: Payer: Self-pay

## 2016-06-17 MED ORDER — METFORMIN HCL 500 MG PO TABS: 500.0000 mg | ORAL_TABLET | Freq: Every day | ORAL | 5 refills | Status: DC

## 2016-06-20 NOTE — Telephone Encounter (Signed)
Patient aware.

## 2016-07-07 ENCOUNTER — Telehealth: Payer: Self-pay | Admitting: *Deleted

## 2016-07-07 NOTE — Telephone Encounter (Signed)
Left pt. Detailed voicemail- we will check her INR 1/25.

## 2016-07-07 NOTE — Telephone Encounter (Signed)
Wants to know if she can get her coumadin checked on the 25th when she comes in to see Dr. Harl Bowie

## 2016-07-09 ENCOUNTER — Ambulatory Visit (INDEPENDENT_AMBULATORY_CARE_PROVIDER_SITE_OTHER): Payer: Medicare Other | Admitting: *Deleted

## 2016-07-09 DIAGNOSIS — G459 Transient cerebral ischemic attack, unspecified: Secondary | ICD-10-CM

## 2016-07-09 DIAGNOSIS — Z5181 Encounter for therapeutic drug level monitoring: Secondary | ICD-10-CM

## 2016-07-09 DIAGNOSIS — I480 Paroxysmal atrial fibrillation: Secondary | ICD-10-CM | POA: Diagnosis not present

## 2016-07-09 LAB — POCT INR: INR: 3.5

## 2016-07-10 ENCOUNTER — Encounter: Payer: Self-pay | Admitting: Cardiology

## 2016-07-10 ENCOUNTER — Ambulatory Visit (INDEPENDENT_AMBULATORY_CARE_PROVIDER_SITE_OTHER): Payer: Medicare Other | Admitting: Cardiology

## 2016-07-10 VITALS — BP 134/86 | HR 82 | Ht 67.0 in | Wt 189.0 lb

## 2016-07-10 DIAGNOSIS — I10 Essential (primary) hypertension: Secondary | ICD-10-CM

## 2016-07-10 DIAGNOSIS — R0789 Other chest pain: Secondary | ICD-10-CM

## 2016-07-10 DIAGNOSIS — E782 Mixed hyperlipidemia: Secondary | ICD-10-CM

## 2016-07-10 DIAGNOSIS — I480 Paroxysmal atrial fibrillation: Secondary | ICD-10-CM

## 2016-07-10 NOTE — Patient Instructions (Signed)

## 2016-07-10 NOTE — Progress Notes (Signed)
Clinical Summary Samantha Vega is a 66 y.o.female seen today for follow up of the following medical problems.   1. Chest pain - history of atypical chest pain - denies any recent symptoms  2. Paroxysmal afib - rate control strategy w/ diltiazem and metoprolol  - previously on xarelto however cost was too much, changed to coumadin.   - no recent palpitations. No bleeding troubles on coumadin  3. HTN  - does not check regularly at home - she is compliant with meds  4. HL  - 05/2016 LDL 94  - compliant with statin   Past Medical History:  Diagnosis Date  . Arthritis   . Chest pain    Noncardiac; evaluated and treated in 2008  . Diabetes mellitus   . Enlarged heart   . Hyperlipidemia   . Hypertension   . Obesity   . PAF (paroxysmal atrial fibrillation) ([place])   . Right bundle Irineo Gaulin block    +Palpitations.   EKG 11/2006:  NSR, RBBB, LAFB, markedly delayed R-wave progression, no change; Echocardiogram in 10/2006-suboptimal quality, normal; Event recorder-PVCs, no significant arrhythmias or symptoms ;     No Known Allergies   Current Outpatient Prescriptions  Medication Sig Dispense Refill  . atorvastatin (LIPITOR) 40 MG tablet Take 1 tablet (40 mg total) by mouth daily. 90 tablet 1  . benazepril (LOTENSIN) 20 MG tablet Take 1 tablet (20 mg total) by mouth daily. 90 tablet 1  . calcium-vitamin D (OSCAL 500/200 D-3) 500 MG tablet Take 1 tablet by mouth 2 (two) times daily.     . diclofenac sodium (VOLTAREN) 1 % GEL Apply 4 g topically 4 (four) times daily. 100 g 3  . diltiazem (CARDIZEM CD) 120 MG 24 hr capsule Take 1 capsule (120 mg total) by mouth daily. 90 capsule 1  . metFORMIN (GLUCOPHAGE) 500 MG tablet Take 1 tablet (500 mg total) by mouth daily with breakfast. 60 tablet 5  . metoprolol (LOPRESSOR) 100 MG tablet TAKE 1 TABLET (100 MG TOTAL) BY MOUTH 2 (TWO) TIMES DAILY. 60 tablet 5  . Multiple Vitamins-Minerals ([employer] SPECTRAVITE PO) Take 1 tablet by mouth  daily.     . ONE TOUCH ULTRA TEST test strip USE ONCE DAILY DX E11.9 100 each 3  . ONETOUCH DELICA LANCETS 99991111 MISC Twice daily testing 100 each 3  . warfarin (COUMADIN) 5 MG tablet Take 1 1/2 tablets daily except 1 tablet on Mondays, Wednesdays and Fridays 45 tablet 3   No current facility-administered medications for this visit.      Past Surgical History:  Procedure Laterality Date  . ABDOMINAL HYSTERECTOMY    . CATARACT EXTRACTION W/PHACO Right 01/08/2016   Procedure: CATARACT EXTRACTION PHACO AND INTRAOCULAR LENS PLACEMENT (IOC);  Surgeon: Rutherford Guys, MD;  Location: [facility];  Service: Ophthalmology;  Laterality: Right;  CDE: 5.78  . CATARACT EXTRACTION W/PHACO Left 02/05/2016   Procedure: CATARACT EXTRACTION PHACO AND INTRAOCULAR LENS PLACEMENT LEFT EYE CDE=6.50;  Surgeon: Rutherford Guys, MD;  Location: [facility];  Service: Ophthalmology;  Laterality: Left;  . COLONOSCOPY  2006   Dr. Tamala Julian: Normal, repeat in 2009.  Marland Kitchen COLONOSCOPY N/A 10/08/2015   Procedure: COLONOSCOPY;  Surgeon: Danie Binder, MD;  Location: [facility];  Service: Endoscopy;  Laterality: N/A;  230 - moved to 1:30, spoke with pt   . POLYPECTOMY  10/08/2015   Procedure: POLYPECTOMY;  Surgeon: Danie Binder, MD;  Location: [facility];  Service: Endoscopy;;  cecal polyp removed via  cold forceps with one clip placed/ rectal polyp removed via cold forcep  . SHOULDER SURGERY  2006   Left-post-trauma  . TOTAL ABDOMINAL HYSTERECTOMY W/ BILATERAL SALPINGOOPHORECTOMY       No Known Allergies    Family History  Problem Relation Age of Onset  . Hypertension Mother     and sister  . Arthritis Mother   . Stroke Father   . Hypertension Father   . Coronary artery disease Father   . Diabetes Sister     x1  . Sarcoidosis Sister     x1  . Heart failure Brother   . Colon cancer Neg Hx      Social History Samantha Vega reports that she has never smoked. She has never used smokeless tobacco. Samantha Vega reports that  she does not drink alcohol.   Review of Systems CONSTITUTIONAL: No weight loss, fever, chills, weakness or fatigue.  HEENT: Eyes: No visual loss, blurred vision, double vision or yellow sclerae.No hearing loss, sneezing, congestion, runny nose or sore throat.  SKIN: No rash or itching.  CARDIOVASCULAR: per HPI RESPIRATORY: No shortness of breath, cough or sputum.  GASTROINTESTINAL: No anorexia, nausea, vomiting or diarrhea. No abdominal pain or blood.  GENITOURINARY: No burning on urination, no polyuria NEUROLOGICAL: No headache, dizziness, syncope, paralysis, ataxia, numbness or tingling in the extremities. No change in bowel or bladder control.  MUSCULOSKELETAL: No muscle, back pain, joint pain or stiffness.  LYMPHATICS: No enlarged nodes. No history of splenectomy.  PSYCHIATRIC: No history of depression or anxiety.  ENDOCRINOLOGIC: No reports of sweating, cold or heat intolerance. No polyuria or polydipsia.  Marland Kitchen   Physical Examination Vitals:   07/10/16 0817  BP: 134/86  Pulse: 82   Vitals:   07/10/16 0817  Weight: 189 lb (85.7 kg)  Height: 5\' 7"  (1.702 m)    Gen: resting comfortably, no acute distress HEENT: no scleral icterus, pupils equal round and reactive, no palptable cervical adenopathy,  CV: RRR, no m/r/g, no jvd Resp: Clear to auscultation bilaterally GI: abdomen is soft, non-tender, non-distended, normal bowel sounds, no hepatosplenomegaly MSK: extremities are warm, no edema.  Skin: warm, no rash Neuro:  no focal deficits Psych: appropriate affect   Diagnostic Studies 07/12/12 Echo: LVEF 55-60%, mild basal septal hypertrophy,   07/11/12 Carotid US: <50% bilateral stenosis, 3.4 cm thyroid nodule   03/07/13 EKG: SR, RBBB, LAFB, LAE, non-spec ST/T changes    Assessment and Plan   1. Chest pain - no recent symptoms - continue to monitor  2. Parox afib  - no significant symptoms -she will continue current meds. CHADS2Vasc score is 3, continue  anticoag  3. HTN:  - she is at goal, continue current meds   4. HL:  - she is at goal, continue current statin.      Arnoldo Lenis, M.D.

## 2016-07-18 ENCOUNTER — Other Ambulatory Visit: Payer: Self-pay | Admitting: Cardiology

## 2016-07-27 ENCOUNTER — Other Ambulatory Visit: Payer: Self-pay | Admitting: Family Medicine

## 2016-07-30 ENCOUNTER — Ambulatory Visit (INDEPENDENT_AMBULATORY_CARE_PROVIDER_SITE_OTHER): Payer: Medicare Other | Admitting: *Deleted

## 2016-07-30 ENCOUNTER — Other Ambulatory Visit: Payer: Self-pay | Admitting: Family Medicine

## 2016-07-30 DIAGNOSIS — Z5181 Encounter for therapeutic drug level monitoring: Secondary | ICD-10-CM | POA: Diagnosis not present

## 2016-07-30 DIAGNOSIS — G459 Transient cerebral ischemic attack, unspecified: Secondary | ICD-10-CM

## 2016-07-30 DIAGNOSIS — I480 Paroxysmal atrial fibrillation: Secondary | ICD-10-CM | POA: Diagnosis not present

## 2016-07-30 LAB — POCT INR: INR: 3.3

## 2016-08-11 ENCOUNTER — Other Ambulatory Visit: Payer: Self-pay | Admitting: Family Medicine

## 2016-08-25 ENCOUNTER — Ambulatory Visit (INDEPENDENT_AMBULATORY_CARE_PROVIDER_SITE_OTHER): Payer: Medicare Other | Admitting: *Deleted

## 2016-08-25 DIAGNOSIS — G459 Transient cerebral ischemic attack, unspecified: Secondary | ICD-10-CM

## 2016-08-25 DIAGNOSIS — Z5181 Encounter for therapeutic drug level monitoring: Secondary | ICD-10-CM

## 2016-08-25 DIAGNOSIS — I480 Paroxysmal atrial fibrillation: Secondary | ICD-10-CM | POA: Diagnosis not present

## 2016-08-25 LAB — POCT INR: INR: 2.2

## 2016-09-22 ENCOUNTER — Ambulatory Visit (INDEPENDENT_AMBULATORY_CARE_PROVIDER_SITE_OTHER): Payer: Medicare Other | Admitting: *Deleted

## 2016-09-22 DIAGNOSIS — Z5181 Encounter for therapeutic drug level monitoring: Secondary | ICD-10-CM | POA: Diagnosis not present

## 2016-09-22 DIAGNOSIS — G459 Transient cerebral ischemic attack, unspecified: Secondary | ICD-10-CM

## 2016-09-22 DIAGNOSIS — I480 Paroxysmal atrial fibrillation: Secondary | ICD-10-CM

## 2016-09-22 LAB — POCT INR: INR: 3

## 2016-10-08 ENCOUNTER — Ambulatory Visit: Payer: Medicare Other

## 2016-10-09 ENCOUNTER — Ambulatory Visit (INDEPENDENT_AMBULATORY_CARE_PROVIDER_SITE_OTHER): Payer: Medicare Other

## 2016-10-09 VITALS — BP 128/78 | HR 76 | Temp 97.8°F | Ht 67.0 in | Wt 200.1 lb

## 2016-10-09 DIAGNOSIS — Z0000 Encounter for general adult medical examination without abnormal findings: Secondary | ICD-10-CM | POA: Diagnosis not present

## 2016-10-09 NOTE — Patient Instructions (Addendum)
Samantha Vega , Thank you for taking time to come for your Medicare Wellness Visit. I appreciate your ongoing commitment to your health goals. Please review the following plan we discussed and let me know if I can assist you in the future.   Screening recommendations/referrals: Colonoscopy: Up to date, next due 09/2025 Mammogram: Up to date, next due 10/2016 Bone Density: Up to date Diabetic Eye Exam: Up to date, next due 11/2016 Recommended yearly dental visit for hygiene and checkup  Vaccinations: Influenza vaccine: Up to date, next due 01/2017 Pneumococcal vaccine: Up to date Tdap vaccine: Up to date, next due 04/2021 Shingles vaccine: Up to date    Advanced directives: Advance directive discussed with you today. I have provided a copy for you to complete at home and have notarized. Once this is complete please bring a copy in to our office so we can scan it into your chart.  Conditions/risks identified: Obese, recommend starting a routine exercise program at least 3 days a week for 30-45 minutes at a time as tolerated.   Next appointment: Follow up with Dr. Moshe Cipro on 12/10/2016 at 9:00 am. Please complete labs one week before the appointment with Dr. Moshe Cipro. Follow up in 1 year for your annual wellness visit.   Preventive Care 61 Years and Older, Female Preventive care refers to lifestyle choices and visits with your health care provider that can promote health and wellness. What does preventive care include?  A yearly physical exam. This is also called an annual well check.  Dental exams once or twice a year.  Routine eye exams. Ask your health care provider how often you should have your eyes checked.  Personal lifestyle choices, including:  Daily care of your teeth and gums.  Regular physical activity.  Eating a healthy diet.  Avoiding tobacco and drug use.  Limiting alcohol use.  Practicing safe sex.  Taking low-dose aspirin every day.  Taking vitamin and mineral  supplements as recommended by your health care provider. What happens during an annual well check? The services and screenings done by your health care provider during your annual well check will depend on your age, overall health, lifestyle risk factors, and family history of disease. Counseling  Your health care provider may ask you questions about your:  Alcohol use.  Tobacco use.  Drug use.  Emotional well-being.  Home and relationship well-being.  Sexual activity.  Eating habits.  History of falls.  Memory and ability to understand (cognition).  Work and work Statistician.  Reproductive health. Screening  You may have the following tests or measurements:  Height, weight, and BMI.  Blood pressure.  Lipid and cholesterol levels. These may be checked every 5 years, or more frequently if you are over 62 years old.  Skin check.  Lung cancer screening. You may have this screening every year starting at age 87 if you have a 30-pack-year history of smoking and currently smoke or have quit within the past 15 years.  Fecal occult blood test (FOBT) of the stool. You may have this test every year starting at age 85.  Flexible sigmoidoscopy or colonoscopy. You may have a sigmoidoscopy every 5 years or a colonoscopy every 10 years starting at age 85.  Hepatitis C blood test.  Hepatitis B blood test.  Sexually transmitted disease (STD) testing.  Diabetes screening. This is done by checking your blood sugar (glucose) after you have not eaten for a while (fasting). You may have this done every 1-3 years.  Bone  density scan. This is done to screen for osteoporosis. You may have this done starting at age 66.  Mammogram. This may be done every 1-2 years. Talk to your health care provider about how often you should have regular mammograms. Talk with your health care provider about your test results, treatment options, and if necessary, the need for more tests. Vaccines  Your  health care provider may recommend certain vaccines, such as:  Influenza vaccine. This is recommended every year.  Tetanus, diphtheria, and acellular pertussis (Tdap, Td) vaccine. You may need a Td booster every 10 years.  Zoster vaccine. You may need this after age 84.  Pneumococcal 13-valent conjugate (PCV13) vaccine. One dose is recommended after age 101.  Pneumococcal polysaccharide (PPSV23) vaccine. One dose is recommended after age 7. Talk to your health care provider about which screenings and vaccines you need and how often you need them. This information is not intended to replace advice given to you by your health care provider. Make sure you discuss any questions you have with your health care provider. Document Released: 06/29/2015 Document Revised: 02/20/2016 Document Reviewed: 04/03/2015 Elsevier Interactive Patient Education  2017 [place] Prevention in the Home Falls can cause injuries. They can happen to people of all ages. There are many things you can do to make your home safe and to help prevent falls. What can I do on the outside of my home?  Regularly fix the edges of walkways and driveways and fix any cracks.  Remove anything that might make you trip as you walk through a door, such as a raised step or threshold.  Trim any bushes or trees on the path to your home.  Use bright outdoor lighting.  Clear any walking paths of anything that might make someone trip, such as rocks or tools.  Regularly check to see if handrails are loose or broken. Make sure that both sides of any steps have handrails.  Any raised decks and porches should have guardrails on the edges.  Have any leaves, snow, or ice cleared regularly.  Use sand or salt on walking paths during winter.  Clean up any spills in your garage right away. This includes oil or grease spills. What can I do in the bathroom?  Use night lights.  Install grab bars by the toilet and in the tub and  shower. Do not use towel bars as grab bars.  Use non-skid mats or decals in the tub or shower.  If you need to sit down in the shower, use a plastic, non-slip stool.  Keep the floor dry. Clean up any water that spills on the floor as soon as it happens.  Remove soap buildup in the tub or shower regularly.  Attach bath mats securely with double-sided non-slip rug tape.  Do not have throw rugs and other things on the floor that can make you trip. What can I do in the bedroom?  Use night lights.  Make sure that you have a light by your bed that is easy to reach.  Do not use any sheets or blankets that are too big for your bed. They should not hang down onto the floor.  Have a firm chair that has side arms. You can use this for support while you get dressed.  Do not have throw rugs and other things on the floor that can make you trip. What can I do in the kitchen?  Clean up any spills right away.  Avoid walking on  wet floors.  Keep items that you use a lot in easy-to-reach places.  If you need to reach something above you, use a strong step stool that has a grab bar.  Keep electrical cords out of the way.  Do not use floor polish or wax that makes floors slippery. If you must use wax, use non-skid floor wax.  Do not have throw rugs and other things on the floor that can make you trip. What can I do with my stairs?  Do not leave any items on the stairs.  Make sure that there are handrails on both sides of the stairs and use them. Fix handrails that are broken or loose. Make sure that handrails are as long as the stairways.  Check any carpeting to make sure that it is firmly attached to the stairs. Fix any carpet that is loose or worn.  Avoid having throw rugs at the top or bottom of the stairs. If you do have throw rugs, attach them to the floor with carpet tape.  Make sure that you have a light switch at the top of the stairs and the bottom of the stairs. If you do not  have them, ask someone to add them for you. What else can I do to help prevent falls?  Wear shoes that:  Do not have high heels.  Have rubber bottoms.  Are comfortable and fit you well.  Are closed at the toe. Do not wear sandals.  If you use a stepladder:  Make sure that it is fully opened. Do not climb a closed stepladder.  Make sure that both sides of the stepladder are locked into place.  Ask someone to hold it for you, if possible.  Clearly mark and make sure that you can see:  Any grab bars or handrails.  First and last steps.  Where the edge of each step is.  Use tools that help you move around (mobility aids) if they are needed. These include:  Canes.  Walkers.  Scooters.  Crutches.  Turn on the lights when you go into a dark area. Replace any light bulbs as soon as they burn out.  Set up your furniture so you have a clear path. Avoid moving your furniture around.  If any of your floors are uneven, fix them.  If there are any pets around you, be aware of where they are.  Review your medicines with your doctor. Some medicines can make you feel dizzy. This can increase your chance of falling. Ask your doctor what other things that you can do to help prevent falls. This information is not intended to replace advice given to you by your health care provider. Make sure you discuss any questions you have with your health care provider. Document Released: 03/29/2009 Document Revised: 11/08/2015 Document Reviewed: 07/07/2014 Elsevier Interactive Patient Education  2017 [employer].

## 2016-10-09 NOTE — Progress Notes (Signed)
Subjective:   Samantha Vega is a 66 y.o. female who presents for Medicare Annual (Subsequent) preventive examination.  Review of Systems:  Cardiac Risk Factors include: advanced age (>29men, >80 women);diabetes mellitus;dyslipidemia;hypertension;obesity (BMI >30kg/m2);sedentary lifestyle     Objective:     Vitals: BP 128/78   Pulse 76   Temp 97.8 F (36.6 C) (Oral)   Ht 5\' 7"  (1.702 m)   Wt 200 lb 1.9 oz (90.8 kg)   SpO2 95%   BMI 31.34 kg/m   Body mass index is 31.34 kg/m.   Tobacco History  Smoking Status  . Never Smoker  Smokeless Tobacco  . Never Used     Counseling given: Not Answered   Past Medical History:  Diagnosis Date  . Arthritis   . Chest pain    Noncardiac; evaluated and treated in 2008  . Diabetes mellitus   . Enlarged heart   . Hyperlipidemia   . Hypertension   . Obesity   . PAF (paroxysmal atrial fibrillation) ([place])   . Right bundle branch block    +Palpitations.   EKG 11/2006:  NSR, RBBB, LAFB, markedly delayed R-wave progression, no change; Echocardiogram in 10/2006-suboptimal quality, normal; Event recorder-PVCs, no significant arrhythmias or symptoms ;   Past Surgical History:  Procedure Laterality Date  . ABDOMINAL HYSTERECTOMY    . CATARACT EXTRACTION W/PHACO Right 01/08/2016   Procedure: CATARACT EXTRACTION PHACO AND INTRAOCULAR LENS PLACEMENT (IOC);  Surgeon: Rutherford Guys, MD;  Location: [facility];  Service: Ophthalmology;  Laterality: Right;  CDE: 5.78  . CATARACT EXTRACTION W/PHACO Left 02/05/2016   Procedure: CATARACT EXTRACTION PHACO AND INTRAOCULAR LENS PLACEMENT LEFT EYE CDE=6.50;  Surgeon: Rutherford Guys, MD;  Location: [facility];  Service: Ophthalmology;  Laterality: Left;  . COLONOSCOPY  2006   Dr. Tamala Julian: Normal, repeat in 2009.  Marland Kitchen COLONOSCOPY N/A 10/08/2015   Procedure: COLONOSCOPY;  Surgeon: Danie Binder, MD;  Location: [facility];  Service: Endoscopy;  Laterality: N/A;  230 - moved to 1:30, spoke with pt   . POLYPECTOMY   10/08/2015   Procedure: POLYPECTOMY;  Surgeon: Danie Binder, MD;  Location: [facility];  Service: Endoscopy;;  cecal polyp removed via cold forceps with one clip placed/ rectal polyp removed via cold forcep  . SHOULDER SURGERY  2006   Left-post-trauma  . TOTAL ABDOMINAL HYSTERECTOMY W/ BILATERAL SALPINGOOPHORECTOMY     Family History  Problem Relation Age of Onset  . Hypertension Mother   . Arthritis Mother   . Stroke Father   . Hypertension Father   . Coronary artery disease Father   . Diabetes Sister   . Sarcoidosis Sister   . Diabetes Sister   . Heart failure Brother   . Congestive Heart Failure Brother   . Colon cancer Neg Hx    History  Sexual Activity  . Sexual activity: Yes  . Birth control/ protection: Surgical    Outpatient Encounter Prescriptions as of 10/09/2016  Medication Sig  . atorvastatin (LIPITOR) 40 MG tablet TAKE 1 TABLET (40 MG TOTAL) BY MOUTH DAILY.  . benazepril (LOTENSIN) 20 MG tablet TAKE 1 TABLET (20 MG TOTAL) BY MOUTH DAILY.  . Calcium Carb-Cholecalciferol (CALCIUM 600+D) 600-800 MG-UNIT TABS Take 1 tablet by mouth daily.  . diclofenac sodium (VOLTAREN) 1 % GEL Apply 4 g topically 4 (four) times daily.  Marland Kitchen diltiazem (CARDIZEM CD) 120 MG 24 hr capsule TAKE 1 CAPSULE (120 MG TOTAL) BY MOUTH DAILY.  . metFORMIN (GLUCOPHAGE) 500 MG tablet Take 1  tablet (500 mg total) by mouth daily with breakfast.  . metoprolol (LOPRESSOR) 100 MG tablet TAKE 1 TABLET (100 MG TOTAL) BY MOUTH 2 (TWO) TIMES DAILY.  . Multiple Vitamins-Minerals ([employer] SPECTRAVITE PO) Take 1 tablet by mouth daily.   . ONE TOUCH ULTRA TEST test strip USE ONCE DAILY DX E11.9  . ONETOUCH DELICA LANCETS 63A MISC Twice daily testing  . warfarin (COUMADIN) 5 MG tablet TAKE 1 1/2 TABLETS DAILY EXCEPT 1 TABLET ON MONDAYS, WEDNESDAYS AND FRIDAYS  . [DISCONTINUED] atorvastatin (LIPITOR) 40 MG tablet Take 1 tablet (40 mg total) by mouth daily.  . [DISCONTINUED] benazepril (LOTENSIN) 20 MG tablet Take 1  tablet (20 mg total) by mouth daily.  . [DISCONTINUED] benazepril (LOTENSIN) 20 MG tablet TAKE 1 TABLET (20 MG TOTAL) BY MOUTH DAILY.  . [DISCONTINUED] calcium-vitamin D (OSCAL 500/200 D-3) 500 MG tablet Take 1 tablet by mouth 2 (two) times daily.   . [DISCONTINUED] diltiazem (CARDIZEM CD) 120 MG 24 hr capsule Take 1 capsule (120 mg total) by mouth daily.  . [DISCONTINUED] diltiazem (CARDIZEM CD) 120 MG 24 hr capsule TAKE 1 CAPSULE (120 MG TOTAL) BY MOUTH DAILY.   No facility-administered encounter medications on file as of 10/09/2016.     Activities of Daily Living In your present state of health, do you have any difficulty performing the following activities: 10/09/2016 06/11/2016  Hearing? N N  Vision? N N  Difficulty concentrating or making decisions? N N  Walking or climbing stairs? N N  Dressing or bathing? N N  Doing errands, shopping? N N  Preparing Food and eating ? N -  Using the Toilet? N -  In the past six months, have you accidently leaked urine? N -  Do you have problems with loss of bowel control? N -  Managing your Medications? N -  Managing your Finances? N -  Housekeeping or managing your Housekeeping? N -  Some recent data might be hidden    Patient Care Team: Fayrene Helper, MD as PCP - General Arnoldo Lenis, MD as Consulting Physician (Cardiology) Danie Binder, MD as Consulting Physician (Gastroenterology)    Assessment:    Exercise Activities and Dietary recommendations Current Exercise Habits: The patient does not participate in regular exercise at present, Exercise limited by: None identified  Goals    . Exercise 3x per week (30 min per time)          Recommend starting a routine exercise program at least 3 days a week for 30-45 minutes at a time as tolerated.        Fall Risk Fall Risk  10/09/2016 06/11/2016 09/27/2015 06/07/2015 01/04/2015  Falls in the past year? No No No No No   Depression Screen PHQ 2/9 Scores 10/09/2016 02/12/2016  09/06/2014 04/25/2013  PHQ - 2 Score 0 0 0 0  PHQ- 9 Score - 0 - -     Cognitive Function: Normal    6CIT Screen 10/09/2016  What Year? 0 points  What month? 0 points  What time? 0 points  Count back from 20 0 points  Months in reverse 0 points  Repeat phrase 0 points  Total Score 0    Immunization History  Administered Date(s) Administered  . Influenza Split 04/22/2011, 04/16/2012  . Influenza Whole 03/23/2007, 05/18/2009  . Influenza,inj,Quad PF,36+ Mos 04/25/2013, 05/30/2014, 06/07/2015, 02/12/2016  . Pneumococcal Conjugate-13 09/06/2014  . Pneumococcal Polysaccharide-23 10/31/2009, 02/12/2016  . Td 06/17/2003  . Tdap 04/22/2011  . Zoster 05/18/2009  Screening Tests Health Maintenance  Topic Date Due  . OPHTHALMOLOGY EXAM  11/27/2016  . HEMOGLOBIN A1C  12/10/2016  . INFLUENZA VACCINE  01/14/2017  . FOOT EXAM  06/11/2017  . MAMMOGRAM  10/31/2017  . COLONOSCOPY  10/08/2020  . TETANUS/TDAP  04/21/2021  . DEXA SCAN  Completed  . Hepatitis C Screening  Completed  . PNA vac Low Risk Adult  Completed      Plan:  .I have personally reviewed and addressed the Medicare Annual Wellness questionnaire and have noted the following in the patient's chart:  A. Medical and social history B. Use of alcohol, tobacco or illicit drugs  C. Current medications and supplements D. Functional ability and status E.  Nutritional status F.  Physical activity G. Advance directives H. List of other physicians I.  Hospitalizations, surgeries, and ER visits in previous 12 months J.  [place] to include cognitive, depression, and falls L. Referrals and appointments - none  In addition, I have reviewed and discussed with patient certain preventive protocols, quality metrics, and best practice recommendations. A written personalized care plan for preventive services as well as general preventive health recommendations were provided to patient.  Signed,   Stormy Fabian,  LPN Lead Nurse Health Advisor

## 2016-10-20 ENCOUNTER — Ambulatory Visit (INDEPENDENT_AMBULATORY_CARE_PROVIDER_SITE_OTHER): Payer: Medicare Other | Admitting: *Deleted

## 2016-10-20 DIAGNOSIS — G459 Transient cerebral ischemic attack, unspecified: Secondary | ICD-10-CM | POA: Diagnosis not present

## 2016-10-20 DIAGNOSIS — Z5181 Encounter for therapeutic drug level monitoring: Secondary | ICD-10-CM | POA: Diagnosis not present

## 2016-10-20 DIAGNOSIS — I480 Paroxysmal atrial fibrillation: Secondary | ICD-10-CM | POA: Diagnosis not present

## 2016-10-20 LAB — POCT INR: INR: 2.9

## 2016-10-23 ENCOUNTER — Other Ambulatory Visit: Payer: Self-pay | Admitting: Family Medicine

## 2016-10-23 DIAGNOSIS — Z1231 Encounter for screening mammogram for malignant neoplasm of breast: Secondary | ICD-10-CM

## 2016-11-03 ENCOUNTER — Ambulatory Visit (HOSPITAL_COMMUNITY): Payer: Medicare Other

## 2016-11-03 ENCOUNTER — Ambulatory Visit (HOSPITAL_COMMUNITY)
Admission: RE | Admit: 2016-11-03 | Discharge: 2016-11-03 | Disposition: A | Discharge status: Home / Self Care | Payer: Medicare Other | Source: Ambulatory Visit | Attending: Family Medicine | Admitting: Family Medicine

## 2016-11-03 ENCOUNTER — Encounter (HOSPITAL_COMMUNITY): Payer: Self-pay

## 2016-11-03 DIAGNOSIS — Z1231 Encounter for screening mammogram for malignant neoplasm of breast: Secondary | ICD-10-CM | POA: Diagnosis not present

## 2016-11-13 ENCOUNTER — Encounter: Payer: Self-pay | Admitting: *Deleted

## 2016-11-23 ENCOUNTER — Other Ambulatory Visit: Payer: Self-pay | Admitting: Cardiology

## 2016-12-03 ENCOUNTER — Ambulatory Visit (INDEPENDENT_AMBULATORY_CARE_PROVIDER_SITE_OTHER): Payer: Medicare Other | Admitting: *Deleted

## 2016-12-03 DIAGNOSIS — I480 Paroxysmal atrial fibrillation: Secondary | ICD-10-CM | POA: Diagnosis not present

## 2016-12-03 DIAGNOSIS — Z5181 Encounter for therapeutic drug level monitoring: Secondary | ICD-10-CM

## 2016-12-03 LAB — POCT INR: INR: 2.2

## 2016-12-09 DIAGNOSIS — Z961 Presence of intraocular lens: Secondary | ICD-10-CM | POA: Diagnosis not present

## 2016-12-10 ENCOUNTER — Encounter: Payer: Self-pay | Admitting: Family Medicine

## 2016-12-10 ENCOUNTER — Ambulatory Visit (INDEPENDENT_AMBULATORY_CARE_PROVIDER_SITE_OTHER): Payer: Medicare Other | Admitting: Family Medicine

## 2016-12-10 VITALS — BP 122/84 | HR 70 | Resp 16 | Ht 67.0 in | Wt 202.0 lb

## 2016-12-10 DIAGNOSIS — E782 Mixed hyperlipidemia: Secondary | ICD-10-CM

## 2016-12-10 DIAGNOSIS — I480 Paroxysmal atrial fibrillation: Secondary | ICD-10-CM | POA: Diagnosis not present

## 2016-12-10 DIAGNOSIS — E1169 Type 2 diabetes mellitus with other specified complication: Secondary | ICD-10-CM | POA: Diagnosis not present

## 2016-12-10 DIAGNOSIS — E669 Obesity, unspecified: Secondary | ICD-10-CM

## 2016-12-10 DIAGNOSIS — E8881 Metabolic syndrome: Secondary | ICD-10-CM

## 2016-12-10 DIAGNOSIS — I10 Essential (primary) hypertension: Secondary | ICD-10-CM

## 2016-12-10 LAB — COMPLETE METABOLIC PANEL WITH GFR
ALBUMIN: 3.8 g/dL (ref 3.6–5.1)
ALK PHOS: 110 U/L (ref 33–130)
ALT: 15 U/L (ref 6–29)
AST: 17 U/L (ref 10–35)
BILIRUBIN TOTAL: 0.3 mg/dL (ref 0.2–1.2)
BUN: 14 mg/dL (ref 7–25)
CALCIUM: 9.1 mg/dL (ref 8.6–10.4)
CO2: 29 mmol/L (ref 20–31)
CREATININE: 0.68 mg/dL (ref 0.50–0.99)
Chloride: 104 mmol/L (ref 98–110)
Glucose, Bld: 99 mg/dL (ref 65–99)
Potassium: 4 mmol/L (ref 3.5–5.3)
Sodium: 139 mmol/L (ref 135–146)
TOTAL PROTEIN: 7.3 g/dL (ref 6.1–8.1)

## 2016-12-10 LAB — LIPID PANEL
CHOLESTEROL: 157 mg/dL (ref <200)
HDL: 30 mg/dL — AB (ref >50)
LDL CALC: 110 mg/dL — AB (ref <100)
TRIGLYCERIDES: 85 mg/dL (ref <150)
Total CHOL/HDL Ratio: 5.2 Ratio — ABNORMAL HIGH (ref <5.0)
VLDL: 17 mg/dL (ref <30)

## 2016-12-10 MED ORDER — BENAZEPRIL HCL 20 MG PO TABS: 20.0000 mg | ORAL_TABLET | Freq: Every day | ORAL | 1 refills | Status: DC

## 2016-12-10 MED ORDER — DILTIAZEM HCL ER COATED BEADS 120 MG PO CP24: 120.0000 mg | ORAL_CAPSULE | Freq: Every day | ORAL | 1 refills | Status: DC

## 2016-12-10 NOTE — Assessment & Plan Note (Signed)
Controlled, no change in medication DASH diet and commitment to daily physical activity for a minimum of 30 minutes discussed and encouraged, as a part of hypertension management. The importance of attaining a healthy weight is also discussed.  BP/Weight 12/10/2016 10/09/2016 07/10/2016 06/11/2016 02/12/2016 02/05/2016 0/67/7034  Systolic BP 035 248 185 909 311 216 244  Diastolic BP 84 78 86 62 82 61 82  Wt. (Lbs) 202 200.12 189 190 191 - -  BMI 31.64 31.34 29.6 33.66 33.83 - -

## 2016-12-10 NOTE — Assessment & Plan Note (Signed)
Rate controlled on current medications, continue same

## 2016-12-10 NOTE — Progress Notes (Signed)
Samantha Vega     MRN: [id number]      DOB: 1951/04/22   HPI Samantha Vega is here for follow up and re-evaluation of chronic medical conditions, medication management and review of any available recent lab and radiology data.  Preventive health is updated, specifically  Cancer screening and Immunization.   Questions or concerns regarding consultations or procedures which the PT has had in the interim are  addressed. The PT denies any adverse reactions to current medications since the last visit.  C/o bilateral ankle pain and swelling with reduced mobility, requests new shoes   ROS Denies recent fever or chills. Denies sinus pressure, nasal congestion, ear pain or sore throat. Denies chest congestion, productive cough or wheezing. Denies chest pains, palpitations and leg swelling Denies abdominal pain, nausea, vomiting,diarrhea or constipation.   Denies dysuria, frequency, hesitancy or incontinence. Denies joint pain, swelling and limitation in mobility. Denies headaches, seizures, numbness, or tingling. Denies depression, anxiety or insomnia. Denies skin break down or rash.   PE  BP 122/84   Pulse 70   Resp 16   Ht 5\' 7"  (1.702 m)   Wt 202 lb (91.6 kg)   SpO2 94%   BMI 31.64 kg/m   Patient alert and oriented and in no cardiopulmonary distress.  HEENT: No facial asymmetry, EOMI,   oropharynx pink and moist.  Neck supple no JVD, no mass.  Chest: Clear to auscultation bilaterally.  CVS: S1, S2 no murmurs, no S3.Regular rate.  ABD: Soft non tender.   Ext: No edema  MS: Adequate though reduced ROM spine, shoulders, hips and knees.  Skin: Intact, no ulcerations or rash noted.  Psych: Good eye contact, normal affect. Memory intact not anxious or depressed appearing.  CNS: CN 2-12 intact, power,  normal throughout.no focal deficits noted.   Assessment & Plan Diabetes mellitus type 2 in obese Updated lab needed , will do today Samantha Vega is reminded of the  importance of commitment to daily physical activity for 30 minutes or more, as able and the need to limit carbohydrate intake to 30 to 60 grams per meal to help with blood sugar control.   The need to take medication as prescribed, test blood sugar as directed, and to call between visits if there is a concern that blood sugar is uncontrolled is also discussed.   Samantha Vega is reminded of the importance of daily foot exam, annual eye examination, and good blood sugar, blood pressure and cholesterol control.  Diabetic Labs Latest Ref Rng & Units 06/11/2016 02/12/2016 02/05/2016 01/03/2016 12/27/2014  HbA1c - 5.9 - 6.0 - 5.9(H)  Microalbumin Not Estab. ug/mL - 13.4(H) - - 0.6  Micro/Creat Ratio 0.0 - 30.0 mg/g creat - 7.6 - - 3.5  Chol 0 - 200 mg/dL - - - - 138  HDL >=46 mg/dL - - - - 34(L)  Calc LDL mg/dL 94 - 71 - 87  Triglycerides <150 mg/dL - - - - 86  Creatinine 0.44 - 1.00 mg/dL - - - 0.68 0.67   BP/Weight 12/10/2016 10/09/2016 07/10/2016 06/11/2016 02/12/2016 02/05/2016 6/38/4536  Systolic BP 468 032 122 482 500 370 488  Diastolic BP 84 78 86 62 82 61 82  Wt. (Lbs) 202 200.12 189 190 191 - -  BMI 31.64 31.34 29.6 33.66 33.83 - -   Foot/eye exam completion dates Latest Ref Rng & Units 12/10/2016 06/11/2016  Eye Exam No Retinopathy - -  Foot Form Completion - Done Done  Hypertension Controlled, no change in medication DASH diet and commitment to daily physical activity for a minimum of 30 minutes discussed and encouraged, as a part of hypertension management. The importance of attaining a healthy weight is also discussed.  BP/Weight 12/10/2016 10/09/2016 07/10/2016 06/11/2016 02/12/2016 02/05/2016 08/08/3610  Systolic BP 244 975 300 511 021 117 356  Diastolic BP 84 78 86 62 82 61 82  Wt. (Lbs) 202 200.12 189 190 191 - -  BMI 31.64 31.34 29.6 33.66 33.83 - -       Obesity Deteriorated. Patient re-educated about  the importance of commitment to a  minimum of 150 minutes of  exercise per week.  The importance of healthy food choices with portion control discussed. Encouraged to start a food diary, count calories and to consider  joining a support group. Sample diet sheets offered. Goals set by the patient for the next several months.   Weight /BMI 12/10/2016 10/09/2016 07/10/2016  WEIGHT 202 lb 200 lb 1.9 oz 189 lb  HEIGHT 5\' 7"  5\' 7"  5\' 7"   BMI 31.64 kg/m2 31.34 kg/m2 29.6 kg/m2      Paroxysmal atrial fibrillation Rate controlled on current medications, continue same  Hyperlipidemia Hyperlipidemia:Low fat diet discussed and encouraged. Start crestor 5  mg daily   Lipid Panel  Lab Results  Component Value Date   CHOL 157 12/10/2016   HDL 30 (L) 12/10/2016   LDLCALC 110 (H) 12/10/2016   TRIG 85 12/10/2016   CHOLHDL 5.2 (H) 12/10/2016   Uncontrolled need to reduce fried and fatty foods, and increase exercise    Metabolic syndrome X The increased risk of cardiovascular disease associated with this diagnosis, and the need to consistently work on lifestyle to change this is discussed. Following  a  heart healthy diet ,commitment to 30 minutes of exercise at least 5 days per week, as well as control of blood sugar and cholesterol , and achieving a healthy weight are all the areas to be addressed .

## 2016-12-10 NOTE — Assessment & Plan Note (Signed)
Updated lab needed , will do today Samantha Vega is reminded of the importance of commitment to daily physical activity for 30 minutes or more, as able and the need to limit carbohydrate intake to 30 to 60 grams per meal to help with blood sugar control.   The need to take medication as prescribed, test blood sugar as directed, and to call between visits if there is a concern that blood sugar is uncontrolled is also discussed.   Samantha Vega is reminded of the importance of daily foot exam, annual eye examination, and good blood sugar, blood pressure and cholesterol control.  Diabetic Labs Latest Ref Rng & Units 06/11/2016 02/12/2016 02/05/2016 01/03/2016 12/27/2014  HbA1c - 5.9 - 6.0 - 5.9(H)  Microalbumin Not Estab. ug/mL - 13.4(H) - - 0.6  Micro/Creat Ratio 0.0 - 30.0 mg/g creat - 7.6 - - 3.5  Chol 0 - 200 mg/dL - - - - 138  HDL >=46 mg/dL - - - - 34(L)  Calc LDL mg/dL 94 - 71 - 87  Triglycerides <150 mg/dL - - - - 86  Creatinine 0.44 - 1.00 mg/dL - - - 0.68 0.67   BP/Weight 12/10/2016 10/09/2016 07/10/2016 06/11/2016 02/12/2016 02/05/2016 3/70/4888  Systolic BP 916 945 038 882 800 349 179  Diastolic BP 84 78 86 62 82 61 82  Wt. (Lbs) 202 200.12 189 190 191 - -  BMI 31.64 31.34 29.6 33.66 33.83 - -   Foot/eye exam completion dates Latest Ref Rng & Units 12/10/2016 06/11/2016  Eye Exam No Retinopathy - -  Foot Form Completion - Done Done

## 2016-12-10 NOTE — Assessment & Plan Note (Addendum)
Hyperlipidemia:Low fat diet discussed and encouraged. Start crestor 5  mg daily   Lipid Panel  Lab Results  Component Value Date   CHOL 157 12/10/2016   HDL 30 (L) 12/10/2016   LDLCALC 110 (H) 12/10/2016   TRIG 85 12/10/2016   CHOLHDL 5.2 (H) 12/10/2016   Uncontrolled need to reduce fried and fatty foods, and increase exercise

## 2016-12-10 NOTE — Assessment & Plan Note (Signed)
Deteriorated. Patient re-educated about  the importance of commitment to a  minimum of 150 minutes of exercise per week.  The importance of healthy food choices with portion control discussed. Encouraged to start a food diary, count calories and to consider  joining a support group. Sample diet sheets offered. Goals set by the patient for the next several months.   Weight /BMI 12/10/2016 10/09/2016 07/10/2016  WEIGHT 202 lb 200 lb 1.9 oz 189 lb  HEIGHT 5\' 7"  5\' 7"  5\' 7"   BMI 31.64 kg/m2 31.34 kg/m2 29.6 kg/m2

## 2016-12-10 NOTE — Patient Instructions (Addendum)
Annual physical exam 06/12/2017 or after , call if you need me sooner  NEED to commit  To  Exercise 30 minutes at least 5 days per week for good health  Fasting lipid, cmp and eGFr , hBA1C today  You are referred to podiatrist for foot care and get your new shoes today also  Please work on good  health habits so that your health will improve. 1. Commitment to daily physical activity for 30 to 60  minutes, if you are able to do this.  2. Commitment to wise food choices. Aim for half of your  food intake to be vegetable and fruit, one quarter starchy foods, and one quarter protein. Try to eat on a regular schedule  3 meals per day, snacking between meals should be limited to vegetables or fruits or small portions of nuts. 64 ounces of water per day is generally recommended, unless you have specific health conditions, like heart failure or kidney failure where you will need to limit fluid intake.  3. Commitment to sufficient and a  good quality of physical and mental rest daily, generally between 6 to 8 hours per day.  WITH PERSISTANCE AND PERSEVERANCE, THE IMPOSSIBLE , BECOMES THE NORM! Thank you  for choosing  Primary Care. We consider it a privelige to serve you.  Delivering excellent health care in a caring and  compassionate way is our goal.  Partnering with you,  so that together we can achieve this goal is our strategy.        

## 2016-12-10 NOTE — Assessment & Plan Note (Signed)
The increased risk of cardiovascular disease associated with this diagnosis, and the need to consistently work on lifestyle to change this is discussed. Following  a  heart healthy diet ,commitment to 30 minutes of exercise at least 5 days per week, as well as control of blood sugar and cholesterol , and achieving a healthy weight are all the areas to be addressed .  

## 2016-12-11 ENCOUNTER — Other Ambulatory Visit: Payer: Self-pay | Admitting: Family Medicine

## 2016-12-11 LAB — HEMOGLOBIN A1C
HEMOGLOBIN A1C: 6.1 % — AB (ref <5.7)
MEAN PLASMA GLUCOSE: 128 mg/dL

## 2016-12-16 NOTE — Progress Notes (Signed)
Left message to call office

## 2016-12-31 ENCOUNTER — Other Ambulatory Visit: Payer: Self-pay

## 2016-12-31 ENCOUNTER — Telehealth: Payer: Self-pay | Admitting: Family Medicine

## 2016-12-31 MED ORDER — UNABLE TO FIND: 0 refills | Status: DC

## 2017-01-01 ENCOUNTER — Telehealth: Payer: Self-pay | Admitting: *Deleted

## 2017-01-01 ENCOUNTER — Other Ambulatory Visit: Payer: Self-pay

## 2017-01-01 NOTE — Telephone Encounter (Signed)
Doris called from [place] and stated that she needs a Rx faxed over for the patient's diabetic shoes. She received all her information pertaining to the shoes. She is only missing the RX. Please advise. Thank you

## 2017-01-01 NOTE — Telephone Encounter (Signed)
This was given to the patient but I faxed it over to [place]

## 2017-01-14 ENCOUNTER — Ambulatory Visit (INDEPENDENT_AMBULATORY_CARE_PROVIDER_SITE_OTHER): Payer: Medicare Other | Admitting: *Deleted

## 2017-01-14 DIAGNOSIS — I480 Paroxysmal atrial fibrillation: Secondary | ICD-10-CM

## 2017-01-14 DIAGNOSIS — Z5181 Encounter for therapeutic drug level monitoring: Secondary | ICD-10-CM

## 2017-01-14 LAB — POCT INR: INR: 2.3

## 2017-01-20 ENCOUNTER — Ambulatory Visit (HOSPITAL_COMMUNITY)
Admission: RE | Admit: 2017-01-20 | Discharge: 2017-01-20 | Disposition: A | Discharge status: Home / Self Care | Payer: Medicare Other | Source: Ambulatory Visit | Attending: Family Medicine | Admitting: Family Medicine

## 2017-01-20 ENCOUNTER — Encounter: Payer: Self-pay | Admitting: Family Medicine

## 2017-01-20 ENCOUNTER — Ambulatory Visit (INDEPENDENT_AMBULATORY_CARE_PROVIDER_SITE_OTHER): Payer: Medicare Other | Admitting: Family Medicine

## 2017-01-20 VITALS — BP 118/68 | HR 73 | Resp 16 | Ht 67.0 in | Wt 203.0 lb

## 2017-01-20 DIAGNOSIS — E785 Hyperlipidemia, unspecified: Secondary | ICD-10-CM

## 2017-01-20 DIAGNOSIS — M25471 Effusion, right ankle: Secondary | ICD-10-CM | POA: Diagnosis not present

## 2017-01-20 DIAGNOSIS — E1169 Type 2 diabetes mellitus with other specified complication: Secondary | ICD-10-CM | POA: Diagnosis not present

## 2017-01-20 DIAGNOSIS — M25571 Pain in right ankle and joints of right foot: Secondary | ICD-10-CM

## 2017-01-20 DIAGNOSIS — I10 Essential (primary) hypertension: Secondary | ICD-10-CM | POA: Diagnosis not present

## 2017-01-20 DIAGNOSIS — G8929 Other chronic pain: Secondary | ICD-10-CM | POA: Diagnosis not present

## 2017-01-20 DIAGNOSIS — M7989 Other specified soft tissue disorders: Secondary | ICD-10-CM | POA: Insufficient documentation

## 2017-01-20 DIAGNOSIS — E669 Obesity, unspecified: Secondary | ICD-10-CM | POA: Diagnosis not present

## 2017-01-20 MED ORDER — ROSUVASTATIN CALCIUM 10 MG PO TABS: 10.0000 mg | ORAL_TABLET | Freq: Every day | ORAL | 5 refills | Status: DC

## 2017-01-20 MED ORDER — FUROSEMIDE 20 MG PO TABS: ORAL_TABLET | ORAL | 0 refills | Status: DC

## 2017-01-20 NOTE — Patient Instructions (Addendum)
F/u as before, call if you need me sooner  Increase dose of crestor to 10 mg daily, OK two 5 mg tablets evry day till done  1 week of lasix sent forankle swelling  I believe you have  arthritis in the ankle, please get X ray  Work on weight loss and les fat in diet  You need fasting lipid,cmp and eGFr and hBA1C 1 week before your December visit

## 2017-01-21 ENCOUNTER — Encounter: Payer: Self-pay | Admitting: Family Medicine

## 2017-01-22 ENCOUNTER — Other Ambulatory Visit: Payer: Self-pay | Admitting: Family Medicine

## 2017-01-23 ENCOUNTER — Other Ambulatory Visit: Payer: Self-pay | Admitting: Cardiology

## 2017-01-25 ENCOUNTER — Encounter: Payer: Self-pay | Admitting: Family Medicine

## 2017-01-25 DIAGNOSIS — M25571 Pain in right ankle and joints of right foot: Secondary | ICD-10-CM | POA: Insufficient documentation

## 2017-01-25 NOTE — Assessment & Plan Note (Signed)
2 week h/o uncontrolled right ankle pain limiting mobility pt unable to weight bear without pain, denies trauma. Exam positive for tenderness, reduced ROM and deformity.  Anti inflammatories, , X ray and hard shoe, may need podiatry, short course of diuretic for swelling

## 2017-01-25 NOTE — Assessment & Plan Note (Signed)
Controlled, no change in medication Samantha Vega is reminded of the importance of commitment to daily physical activity for 30 minutes or more, as able and the need to limit carbohydrate intake to 30 to 60 grams per meal to help with blood sugar control.   The need to take medication as prescribed, test blood sugar as directed, and to call between visits if there is a concern that blood sugar is uncontrolled is also discussed.   Samantha Vega is reminded of the importance of daily foot exam, annual eye examination, and good blood sugar, blood pressure and cholesterol control.  Diabetic Labs Latest Ref Rng & Units 12/10/2016 06/11/2016 02/12/2016 02/05/2016 01/03/2016  HbA1c <5.7 % 6.1(H) 5.9 - 6.0 -  Microalbumin Not Estab. ug/mL - - 13.4(H) - -  Micro/Creat Ratio 0.0 - 30.0 mg/g creat - - 7.6 - -  Chol <200 mg/dL 157 - - - -  HDL >50 mg/dL 30(L) - - - -  Calc LDL <100 mg/dL 110(H) 94 - 71 -  Triglycerides <150 mg/dL 85 - - - -  Creatinine 0.50 - 0.99 mg/dL 0.68 - - - 0.68   BP/Weight 01/20/2017 12/10/2016 10/09/2016 07/10/2016 06/11/2016 02/12/2016 1/61/0960  Systolic BP 454 098 119 147 829 562 130  Diastolic BP 68 84 78 86 62 82 61  Wt. (Lbs) 203 202 200.12 189 190 191 -  BMI 31.79 31.64 31.34 29.6 33.66 33.83 -   Foot/eye exam completion dates Latest Ref Rng & Units 12/10/2016 06/11/2016  Eye Exam No Retinopathy - -  Foot Form Completion - Done Done

## 2017-01-25 NOTE — Assessment & Plan Note (Signed)
Deteriorated. Patient re-educated about  the importance of commitment to a  minimum of 150 minutes of exercise per week.  The importance of healthy food choices with portion control discussed. Encouraged to start a food diary, count calories and to consider  joining a support group. Sample diet sheets offered. Goals set by the patient for the next several months.   Weight /BMI 01/20/2017 12/10/2016 10/09/2016  WEIGHT 203 lb 202 lb 200 lb 1.9 oz  HEIGHT 5\' 7"  5\' 7"  5\' 7"   BMI 31.79 kg/m2 31.64 kg/m2 31.34 kg/m2

## 2017-01-25 NOTE — Progress Notes (Signed)
Samantha Vega     MRN: [id number]      DOB: 1951/06/14   HPI Ms. Leon is here with a 2 week h/o right ankle pain and swelling which make it difficult for her to bear weight on the foot. No r Recent labs are reviewed and medication adjustments are made also  ROS Denies recent fever or chills. Denies sinus pressure, nasal congestion, ear pain or sore throat. Denies chest congestion, productive cough or wheezing. Denies chest pains, palpitations and leg swelling Denies abdominal pain, nausea, vomiting,diarrhea or constipation.   Denies dysuria, frequency, hesitancy or incontinence.  Denies skin break down or rash.   PE  BP 118/68   Pulse 73   Resp 16   Ht 5\' 7"  (1.702 m)   Wt 203 lb (92.1 kg)   SpO2 97%   BMI 31.79 kg/m   Patient alert and oriented and in no cardiopulmonary distress.  HEENT: No facial asymmetry, EOMI,   oropharynx pink and moist.  Neck supple no JVD, no mass.  Chest: Clear to auscultation bilaterally.  CVS: S1, S2 no murmurs, no S3.Regular rate.  ABD: Soft non tender.   Ext: 1 plus edema around right ankle pitting MS: Adequate ROM spine, shoulders, hips and knees.Decreased ROM right ankle with tenderness and deformity  Skin: Intact, no ulcerations or rash noted.  Psych: Good eye contact, normal affect. Memory intact not anxious or depressed appearing.  CNS: CN 2-12 intact, power,  normal throughout.no focal deficits noted.   Assessment & Plan  Right ankle pain 2 week h/o uncontrolled right ankle pain limiting mobility pt unable to weight bear without pain, denies trauma. Exam positive for tenderness, reduced ROM and deformity.  Anti inflammatories, , X ray and hard shoe, may need podiatry, short course of diuretic for swelling  Essential hypertension Controlled, no change in medication DASH diet and commitment to daily physical activity for a minimum of 30 minutes discussed and encouraged, as a part of hypertension management. The  importance of attaining a healthy weight is also discussed.  BP/Weight 01/20/2017 12/10/2016 10/09/2016 07/10/2016 06/11/2016 02/12/2016 09/22/8117  Systolic BP 147 829 562 130 865 784 696  Diastolic BP 68 84 78 86 62 82 61  Wt. (Lbs) 203 202 200.12 189 190 191 -  BMI 31.79 31.64 31.34 29.6 33.66 33.83 -       Diabetes mellitus type 2 in obese Controlled, no change in medication Ms. Bollier is reminded of the importance of commitment to daily physical activity for 30 minutes or more, as able and the need to limit carbohydrate intake to 30 to 60 grams per meal to help with blood sugar control.   The need to take medication as prescribed, test blood sugar as directed, and to call between visits if there is a concern that blood sugar is uncontrolled is also discussed.   Ms. Bucy is reminded of the importance of daily foot exam, annual eye examination, and good blood sugar, blood pressure and cholesterol control.  Diabetic Labs Latest Ref Rng & Units 12/10/2016 06/11/2016 02/12/2016 02/05/2016 01/03/2016  HbA1c <5.7 % 6.1(H) 5.9 - 6.0 -  Microalbumin Not Estab. ug/mL - - 13.4(H) - -  Micro/Creat Ratio 0.0 - 30.0 mg/g creat - - 7.6 - -  Chol <200 mg/dL 157 - - - -  HDL >50 mg/dL 30(L) - - - -  Calc LDL <100 mg/dL 110(H) 94 - 71 -  Triglycerides <150 mg/dL 85 - - - -  Creatinine 0.50 -  0.99 mg/dL 0.68 - - - 0.68   BP/Weight 01/20/2017 12/10/2016 10/09/2016 07/10/2016 06/11/2016 02/12/2016 4/74/2595  Systolic BP 638 756 433 295 188 416 606  Diastolic BP 68 84 78 86 62 82 61  Wt. (Lbs) 203 202 200.12 189 190 191 -  BMI 31.79 31.64 31.34 29.6 33.66 33.83 -   Foot/eye exam completion dates Latest Ref Rng & Units 12/10/2016 06/11/2016  Eye Exam No Retinopathy - -  Foot Form Completion - Done Done        Hyperlipidemia LDL goal <100 Hyperlipidemia:Low fat diet discussed and encouraged.   Lipid Panel  Lab Results  Component Value Date   CHOL 157 12/10/2016   HDL 30 (L) 12/10/2016   LDLCALC  110 (H) 12/10/2016   TRIG 85 12/10/2016   CHOLHDL 5.2 (H) 12/10/2016   Increase dose of statin and lower fat intake, not at goal Updated lab needed at/ before next visit.     Obesity Deteriorated. Patient re-educated about  the importance of commitment to a  minimum of 150 minutes of exercise per week.  The importance of healthy food choices with portion control discussed. Encouraged to start a food diary, count calories and to consider  joining a support group. Sample diet sheets offered. Goals set by the patient for the next several months.   Weight /BMI 01/20/2017 12/10/2016 10/09/2016  WEIGHT 203 lb 202 lb 200 lb 1.9 oz  HEIGHT 5\' 7"  5\' 7"  5\' 7"   BMI 31.79 kg/m2 31.64 kg/m2 31.34 kg/m2

## 2017-01-25 NOTE — Assessment & Plan Note (Signed)
Hyperlipidemia:Low fat diet discussed and encouraged.   Lipid Panel  Lab Results  Component Value Date   CHOL 157 12/10/2016   HDL 30 (L) 12/10/2016   LDLCALC 110 (H) 12/10/2016   TRIG 85 12/10/2016   CHOLHDL 5.2 (H) 12/10/2016   Increase dose of statin and lower fat intake, not at goal Updated lab needed at/ before next visit.

## 2017-01-25 NOTE — Assessment & Plan Note (Signed)
Controlled, no change in medication DASH diet and commitment to daily physical activity for a minimum of 30 minutes discussed and encouraged, as a part of hypertension management. The importance of attaining a healthy weight is also discussed.  BP/Weight 01/20/2017 12/10/2016 10/09/2016 07/10/2016 06/11/2016 02/12/2016 2/40/9735  Systolic BP 329 924 268 341 962 229 798  Diastolic BP 68 84 78 86 62 82 61  Wt. (Lbs) 203 202 200.12 189 190 191 -  BMI 31.79 31.64 31.34 29.6 33.66 33.83 -

## 2017-01-29 DIAGNOSIS — E119 Type 2 diabetes mellitus without complications: Secondary | ICD-10-CM | POA: Diagnosis not present

## 2017-01-29 DIAGNOSIS — M2140 Flat foot [pes planus] (acquired), unspecified foot: Secondary | ICD-10-CM | POA: Diagnosis not present

## 2017-02-11 ENCOUNTER — Other Ambulatory Visit: Payer: Self-pay | Admitting: Cardiology

## 2017-02-11 ENCOUNTER — Ambulatory Visit: Payer: Medicare Other | Admitting: Cardiology

## 2017-02-11 ENCOUNTER — Other Ambulatory Visit: Payer: Self-pay | Admitting: Family Medicine

## 2017-02-11 NOTE — Progress Notes (Deleted)
Clinical Summary Samantha Vega is a 66 y.o.female seen today for follow up of the following medical problems.   1. Chest pain - history of atypical chest pain - denies any recent symptoms  2. Paroxysmal afib - rate control strategy w/ diltiazem and metoprolol  - previously on xarelto however cost was too much, changed to coumadin.   - no recent palpitations. No bleeding troubles on coumadin  3. HTN  - does not check regularly at home - she is compliant with meds  4. HL  - 05/2016 LDL 94  - compliant with statin  - crestor increased to 10mg  daily by pcp recently  Past Medical History:  Diagnosis Date  . Arthritis   . Chest pain    Noncardiac; evaluated and treated in 2008  . Diabetes mellitus   . Enlarged heart   . Hyperlipidemia   . Hypertension   . Obesity   . PAF (paroxysmal atrial fibrillation) ([place])   . Right bundle Carnelia Oscar block    +Palpitations.   EKG 11/2006:  NSR, RBBB, LAFB, markedly delayed R-wave progression, no change; Echocardiogram in 10/2006-suboptimal quality, normal; Event recorder-PVCs, no significant arrhythmias or symptoms ;     No Known Allergies   Current Outpatient Prescriptions  Medication Sig Dispense Refill  . atorvastatin (LIPITOR) 80 MG tablet Take 1 tablet (80 mg total) by mouth daily. 90 tablet 3  . benazepril (LOTENSIN) 20 MG tablet Take 1 tablet (20 mg total) by mouth daily. 90 tablet 1  . benazepril (LOTENSIN) 20 MG tablet TAKE 1 TABLET BY MOUTH EVERY DAY 90 tablet 1  . Calcium Carb-Cholecalciferol (CALCIUM 600+D) 600-800 MG-UNIT TABS Take 1 tablet by mouth daily.    Marland Kitchen diltiazem (CARDIZEM CD) 120 MG 24 hr capsule Take 1 capsule (120 mg total) by mouth daily. 90 capsule 1  . diltiazem (CARDIZEM CD) 120 MG 24 hr capsule TAKE 1 CAPSULE BY MOUTH EVERY DAY 90 capsule 1  . furosemide (LASIX) 20 MG tablet One tablet once daily, as needed, for ankle swelling for 1 week only 7 tablet 0  . metFORMIN (GLUCOPHAGE) 500 MG tablet Take  1 tablet (500 mg total) by mouth daily with breakfast. 60 tablet 5  . metoprolol tartrate (LOPRESSOR) 100 MG tablet TAKE 1 TABLET (100 MG TOTAL) BY MOUTH 2 (TWO) TIMES DAILY. 60 tablet 6  . Multiple Vitamins-Minerals ([employer] SPECTRAVITE PO) Take 1 tablet by mouth daily.     . ONE TOUCH ULTRA TEST test strip USE ONCE DAILY DX E11.9 100 each 3  . ONETOUCH DELICA LANCETS 40H MISC Twice daily testing 100 each 3  . rosuvastatin (CRESTOR) 10 MG tablet Take 1 tablet (10 mg total) by mouth daily. 30 tablet 5  . UNABLE TO FIND Diabetic shoes x 1  Inserts x 3   Dx: e11.9 1 each 0  . warfarin (COUMADIN) 5 MG tablet TAKE 1 1/2 TABLETS DAILY EXCEPT 1 TABLET ON MONDAYS, WEDNESDAYS AND FRIDAYS 45 tablet 3   No current facility-administered medications for this visit.      Past Surgical History:  Procedure Laterality Date  . ABDOMINAL HYSTERECTOMY    . BREAST BIOPSY Left    benign  . CATARACT EXTRACTION W/PHACO Right 01/08/2016   Procedure: CATARACT EXTRACTION PHACO AND INTRAOCULAR LENS PLACEMENT (IOC);  Surgeon: Rutherford Guys, MD;  Location: [facility];  Service: Ophthalmology;  Laterality: Right;  CDE: 5.78  . CATARACT EXTRACTION W/PHACO Left 02/05/2016   Procedure: CATARACT EXTRACTION PHACO AND INTRAOCULAR LENS PLACEMENT LEFT  EYE CDE=6.50;  Surgeon: Rutherford Guys, MD;  Location: [facility];  Service: Ophthalmology;  Laterality: Left;  . COLONOSCOPY  2006   Dr. Tamala Julian: Normal, repeat in 2009.  Marland Kitchen COLONOSCOPY N/A 10/08/2015   Procedure: COLONOSCOPY;  Surgeon: Danie Binder, MD;  Location: [facility];  Service: Endoscopy;  Laterality: N/A;  230 - moved to 1:30, spoke with pt   . POLYPECTOMY  10/08/2015   Procedure: POLYPECTOMY;  Surgeon: Danie Binder, MD;  Location: [facility];  Service: Endoscopy;;  cecal polyp removed via cold forceps with one clip placed/ rectal polyp removed via cold forcep  . SHOULDER SURGERY  2006   Left-post-trauma  . TOTAL ABDOMINAL HYSTERECTOMY W/ BILATERAL SALPINGOOPHORECTOMY        No Known Allergies    Family History  Problem Relation Age of Onset  . Hypertension Mother   . Arthritis Mother   . Stroke Father   . Hypertension Father   . Coronary artery disease Father   . Diabetes Sister   . Sarcoidosis Sister   . Diabetes Sister   . Heart failure Brother   . Congestive Heart Failure Brother   . Colon cancer Neg Hx      Social History Samantha Vega reports that she has never smoked. She has never used smokeless tobacco. Samantha Vega reports that she does not drink alcohol.   Review of Systems CONSTITUTIONAL: No weight loss, fever, chills, weakness or fatigue.  HEENT: Eyes: No visual loss, blurred vision, double vision or yellow sclerae.No hearing loss, sneezing, congestion, runny nose or sore throat.  SKIN: No rash or itching.  CARDIOVASCULAR:  RESPIRATORY: No shortness of breath, cough or sputum.  GASTROINTESTINAL: No anorexia, nausea, vomiting or diarrhea. No abdominal pain or blood.  GENITOURINARY: No burning on urination, no polyuria NEUROLOGICAL: No headache, dizziness, syncope, paralysis, ataxia, numbness or tingling in the extremities. No change in bowel or bladder control.  MUSCULOSKELETAL: No muscle, back pain, joint pain or stiffness.  LYMPHATICS: No enlarged nodes. No history of splenectomy.  PSYCHIATRIC: No history of depression or anxiety.  ENDOCRINOLOGIC: No reports of sweating, cold or heat intolerance. No polyuria or polydipsia.  Marland Kitchen   Physical Examination There were no vitals filed for this visit. There were no vitals filed for this visit.  Gen: resting comfortably, no acute distress HEENT: no scleral icterus, pupils equal round and reactive, no palptable cervical adenopathy,  CV Resp: Clear to auscultation bilaterally GI: abdomen is soft, non-tender, non-distended, normal bowel sounds, no hepatosplenomegaly MSK: extremities are warm, no edema.  Skin: warm, no rash Neuro:  no focal deficits Psych: appropriate  affect   Diagnostic Studies 07/12/12 Echo: LVEF 55-60%, mild basal septal hypertrophy,   07/11/12 Carotid US: <50% bilateral stenosis, 3.4 cm thyroid nodule     Assessment and Plan  1. Chest pain - no recent symptoms - continue to monitor  2. Parox afib  - no significant symptoms -she will continue current meds. CHADS2Vasc score is 3, continue anticoag  3. HTN:  - she is at goal, continue current meds   4. HL:  - she is at goal, continue current statin.       Arnoldo Lenis, M.D., F.A.C.C.

## 2017-02-19 ENCOUNTER — Encounter (HOSPITAL_COMMUNITY): Payer: Self-pay | Admitting: Emergency Medicine

## 2017-02-19 ENCOUNTER — Emergency Department (HOSPITAL_COMMUNITY)
Admission: EM | Admit: 2017-02-19 | Discharge: 2017-02-19 | Disposition: A | Discharge status: Home / Self Care | Payer: Medicare Other | Attending: Emergency Medicine | Admitting: Emergency Medicine

## 2017-02-19 DIAGNOSIS — E119 Type 2 diabetes mellitus without complications: Secondary | ICD-10-CM | POA: Insufficient documentation

## 2017-02-19 DIAGNOSIS — R600 Localized edema: Secondary | ICD-10-CM | POA: Insufficient documentation

## 2017-02-19 DIAGNOSIS — I10 Essential (primary) hypertension: Secondary | ICD-10-CM | POA: Insufficient documentation

## 2017-02-19 DIAGNOSIS — Z7984 Long term (current) use of oral hypoglycemic drugs: Secondary | ICD-10-CM | POA: Insufficient documentation

## 2017-02-19 DIAGNOSIS — Z79899 Other long term (current) drug therapy: Secondary | ICD-10-CM | POA: Diagnosis not present

## 2017-02-19 DIAGNOSIS — R609 Edema, unspecified: Secondary | ICD-10-CM

## 2017-02-19 NOTE — Discharge Instructions (Signed)
Elevate your legs above your heart 3 different times, every day for 1 hour.  Go to the drugstore and get compression stockings, which go at least to the knees, or above them, and wear them all day when you are awake.  Sure you are taking her medicines as directed, and stay on a low-salt diet.  When you see your doctor ask her if you are on the proper medicines or if some need to be changed to help with the swelling.

## 2017-02-19 NOTE — ED Provider Notes (Signed)
[facility] DEPT Provider Note   CSN: [id number] Arrival date & time: 02/19/17  1926     History   Chief Complaint Chief Complaint  Patient presents with  . Leg Swelling    HPI Samantha Vega is a 66 y.o. female.  Complains of bilateral lower leg swelling for several weeks.  Saw her PCP for the same about 1 month ago was placed on a fluid pill for 10 days, without significant improvement.  She is taking her usual medications.  She denies fever, chills, cough, chest pain, shortness of breath, weakness or dizziness.  There are no other known modifying factors.    HPI  Past Medical History:  Diagnosis Date  . Arthritis   . Chest pain    Noncardiac; evaluated and treated in 2008  . Diabetes mellitus   . Enlarged heart   . Hyperlipidemia   . Hypertension   . Obesity   . PAF (paroxysmal atrial fibrillation) ([place])   . Right bundle branch block    +Palpitations.   EKG 11/2006:  NSR, RBBB, LAFB, markedly delayed R-wave progression, no change; Echocardiogram in 10/2006-suboptimal quality, normal; Event recorder-PVCs, no significant arrhythmias or symptoms ;    Patient Active Problem List   Diagnosis Date Noted  . Right ankle pain 01/25/2017  . Tubular adenoma of colon 10/09/2015  . Baker's cyst of knee 10/04/2013  . Metabolic syndrome X 83/15/1761  . Encounter for therapeutic drug monitoring 08/25/2013  . Chronic anticoagulation 09/16/2012  . Paroxysmal atrial fibrillation ([place]) 05/22/2012  . OA (osteoarthritis) of knee 05/05/2012  . RBBB (right bundle branch block)   . Obesity   . Diabetes mellitus type 2 in obese ([place])   . Unspecified visual loss 12/08/2008  . Essential hypertension 12/08/2008  . Hyperlipidemia LDL goal <100 12/06/2007    Past Surgical History:  Procedure Laterality Date  . ABDOMINAL HYSTERECTOMY    . BREAST BIOPSY Left    benign  . CATARACT EXTRACTION W/PHACO Right 01/08/2016   Procedure: CATARACT EXTRACTION PHACO AND INTRAOCULAR LENS  PLACEMENT (IOC);  Surgeon: Rutherford Guys, MD;  Location: [facility];  Service: Ophthalmology;  Laterality: Right;  CDE: 5.78  . CATARACT EXTRACTION W/PHACO Left 02/05/2016   Procedure: CATARACT EXTRACTION PHACO AND INTRAOCULAR LENS PLACEMENT LEFT EYE CDE=6.50;  Surgeon: Rutherford Guys, MD;  Location: [facility];  Service: Ophthalmology;  Laterality: Left;  . COLONOSCOPY  2006   Dr. Tamala Julian: Normal, repeat in 2009.  Marland Kitchen COLONOSCOPY N/A 10/08/2015   Procedure: COLONOSCOPY;  Surgeon: Danie Binder, MD;  Location: [facility];  Service: Endoscopy;  Laterality: N/A;  230 - moved to 1:30, spoke with pt   . POLYPECTOMY  10/08/2015   Procedure: POLYPECTOMY;  Surgeon: Danie Binder, MD;  Location: [facility];  Service: Endoscopy;;  cecal polyp removed via cold forceps with one clip placed/ rectal polyp removed via cold forcep  . SHOULDER SURGERY  2006   Left-post-trauma  . TOTAL ABDOMINAL HYSTERECTOMY W/ BILATERAL SALPINGOOPHORECTOMY      OB History    No data available       Home Medications    Prior to Admission medications   Medication Sig Start Date End Date Taking? Authorizing Provider  atorvastatin (LIPITOR) 80 MG tablet Take 1 tablet (80 mg total) by mouth daily. 12/11/16  Yes Fayrene Helper, MD  benazepril (LOTENSIN) 20 MG tablet Take 1 tablet (20 mg total) by mouth daily. 12/10/16  Yes Fayrene Helper, MD  Calcium Carb-Cholecalciferol (CALCIUM 600+D) 600-800  MG-UNIT TABS Take 1 tablet by mouth daily.   Yes [provider]  diltiazem (CARDIZEM CD) 120 MG 24 hr capsule Take 1 capsule (120 mg total) by mouth daily. 12/10/16  Yes Fayrene Helper, MD  furosemide (LASIX) 20 MG tablet One tablet once daily, as needed, for ankle swelling for 1 week only 01/20/17  Yes Fayrene Helper, MD  metFORMIN (GLUCOPHAGE) 500 MG tablet Take 1 tablet (500 mg total) by mouth daily with breakfast. 06/17/16  Yes Fayrene Helper, MD  metoprolol tartrate (LOPRESSOR) 100 MG tablet TAKE 1 TABLET (100  MG TOTAL) BY MOUTH 2 (TWO) TIMES DAILY. 11/24/16  Yes BranchAlphonse Guild, MD  Multiple Vitamins-Minerals ([employer] SPECTRAVITE PO) Take 1 tablet by mouth daily.    Yes [provider]  ONE TOUCH ULTRA TEST test strip USE ONCE DAILY DX E11.9 06/10/16  Yes Fayrene Helper, MD  [facility] DELICA LANCETS 29N MISC Twice daily testing 04/25/16  Yes Fayrene Helper, MD  rosuvastatin (CRESTOR) 10 MG tablet Take 1 tablet (10 mg total) by mouth daily. 01/20/17  Yes Fayrene Helper, MD  warfarin (COUMADIN) 5 MG tablet TAKE 1 1/2 TABLETS DAILY EXCEPT 1 TABLET ON Charles[facility] Garibaldi [facility] AND FRIDAYS 01/23/17  Yes Branch, Alphonse Guild, MD    Family History Family History  Problem Relation Age of Onset  . Hypertension Mother   . Arthritis Mother   . Stroke Father   . Hypertension Father   . Coronary artery disease Father   . Diabetes Sister   . Sarcoidosis Sister   . Diabetes Sister   . Heart failure Brother   . Congestive Heart Failure Brother   . Colon cancer Neg Hx     Social History Social History  Substance Use Topics  . Smoking status: Never Smoker  . Smokeless tobacco: Never Used  . Alcohol use No     Allergies   Patient has no known allergies.   Review of Systems Review of Systems  All other systems reviewed and are negative.    Physical Exam Updated Vital Signs BP (!) 140/91 (BP Location: Left Arm)   Pulse 60   Temp 97.7 F (36.5 C) (Oral)   Resp 16   Ht 5\' 3"  (1.6 m)   Wt 93 kg (205 lb)   SpO2 96%   BMI 36.31 kg/m   Physical Exam  Constitutional: She is oriented to person, place, and time. She appears well-developed and well-nourished. No distress.  HENT:  Head: Normocephalic and atraumatic.  Eyes: Pupils are equal, round, and reactive to light. Conjunctivae and EOM are normal.  Neck: Normal range of motion and phonation normal. Neck supple.  Cardiovascular: Normal rate.   Pulmonary/Chest: Effort normal.  Musculoskeletal: Normal range of motion. She  exhibits edema (Symmetric 3+ lower leg edema, bilaterally).  Neurological: She is alert and oriented to person, place, and time. She exhibits normal muscle tone.  Skin: Skin is warm and dry. No rash noted. No erythema. No pallor.  Psychiatric: She has a normal mood and affect. Her behavior is normal. Judgment and thought content normal.  Nursing note and vitals reviewed.    ED Treatments / Results  Labs (all labs ordered are listed, but only abnormal results are displayed) Labs Reviewed - No data to display  EKG  EKG Interpretation None       Radiology No results found.  Procedures Procedures (including critical care time)  Medications Ordered in ED Medications - No data to display  Initial Impression / Assessment and Plan / ED Course  I have reviewed the triage vital signs and the nursing notes.  Pertinent labs & imaging results that were available during my care of the patient were reviewed by me and considered in my medical decision making (see chart for details).      Patient Vitals for the past 24 hrs:  BP Temp Temp src Pulse Resp SpO2 Height Weight  02/19/17 2302 - 97.7 F (36.5 C) Oral - - - - -  02/19/17 2245 (!) 140/91 - - 60 16 96 % - -  02/19/17 1934 (!) 143/44 - - - - - - -  02/19/17 1933 - 98.4 F (36.9 C) Oral 69 15 97 % 5\' 3"  (1.6 m) 93 kg (205 lb)    At discharge- reevaluation with update and discussion. After initial assessment and treatment, an updated evaluation reveals no change in clinical status, ambulating normally, patient's findings discussed with the patient and her husband, all questions were answered. Retha Bither L      Final Clinical Impressions(s) / ED Diagnoses   Final diagnoses:  Peripheral edema    Edema, nonspecific.  No signs of acute congestive heart failure.  Doubt DVT, cellulitis, soft tissue injury.  Nursing Notes Reviewed/ Care Coordinated Applicable Imaging Reviewed Interpretation of Laboratory Data  incorporated into ED treatment  The patient appears reasonably screened and/or stabilized for discharge and I doubt any other medical condition or other [facility] requiring further screening, evaluation, or treatment in the ED at this time prior to discharge.  Plan: Home Medications-continue usual; Home Treatments-low-salt diet, leg elevation frequently, compression stockings bilateral; return here if the recommended treatment, does not improve the symptoms; Recommended follow up-PCP checkup 1 week   New Prescriptions Discharge Medication List as of 02/19/2017 10:40 PM       Daleen Bo, MD 02/20/17 1128

## 2017-02-19 NOTE — ED Notes (Signed)
ED Provider at bedside. 

## 2017-02-19 NOTE — ED Triage Notes (Signed)
Pt c/o lower leg swelling x 2 months, she states the right is worse than the left.

## 2017-02-25 ENCOUNTER — Ambulatory Visit (INDEPENDENT_AMBULATORY_CARE_PROVIDER_SITE_OTHER): Payer: Medicare Other | Admitting: *Deleted

## 2017-02-25 DIAGNOSIS — Z5181 Encounter for therapeutic drug level monitoring: Secondary | ICD-10-CM

## 2017-02-25 DIAGNOSIS — I480 Paroxysmal atrial fibrillation: Secondary | ICD-10-CM | POA: Diagnosis not present

## 2017-02-25 LAB — POCT INR: INR: 2.1

## 2017-02-26 ENCOUNTER — Other Ambulatory Visit: Payer: Self-pay | Admitting: Family Medicine

## 2017-03-06 ENCOUNTER — Other Ambulatory Visit: Payer: Self-pay | Admitting: Family Medicine

## 2017-03-20 ENCOUNTER — Ambulatory Visit (INDEPENDENT_AMBULATORY_CARE_PROVIDER_SITE_OTHER): Payer: Medicare Other | Admitting: Cardiology

## 2017-03-20 ENCOUNTER — Encounter: Payer: Self-pay | Admitting: Cardiology

## 2017-03-20 VITALS — BP 138/84 | HR 78 | Ht 65.0 in | Wt 202.0 lb

## 2017-03-20 DIAGNOSIS — I480 Paroxysmal atrial fibrillation: Secondary | ICD-10-CM

## 2017-03-20 MED ORDER — FUROSEMIDE 20 MG PO TABS: 20.0000 mg | ORAL_TABLET | Freq: Every day | ORAL | 3 refills | Status: DC | PRN

## 2017-03-20 MED ORDER — ATORVASTATIN CALCIUM 80 MG PO TABS: 80.0000 mg | ORAL_TABLET | Freq: Every day | ORAL | 3 refills | Status: DC

## 2017-03-20 NOTE — Progress Notes (Signed)
Clinical Summary Samantha Vega is a 66 y.o.female seen today for follow up of the following medical problems.   1. Chest pain - history of atypical chest pain - no recent symptoms   2. Paroxysmal afib - rate control strategy w/ diltiazem and metoprolol  - previously on xarelto however cost was too much, changed to coumadin.   - no recent palpitaitons. Has not had any bleeding on anticoag.    3. HTN  - compliant with bp meds  4. HL  - she is compliant with her statin  5. LE edema - started about 47month ago - she reports pcp gave 1 week of lasix with some transient improvement - prior echo Jan 2014 LVEF 50-35%, diastolic function not descriebed due to afib   Past Medical History:  Diagnosis Date  . Arthritis   . Chest pain    Noncardiac; evaluated and treated in 2008  . Diabetes mellitus   . Enlarged heart   . Hyperlipidemia   . Hypertension   . Obesity   . PAF (paroxysmal atrial fibrillation) ([place])   . Right bundle Shritha Bresee block    +Palpitations.   EKG 11/2006:  NSR, RBBB, LAFB, markedly delayed R-wave progression, no change; Echocardiogram in 10/2006-suboptimal quality, normal; Event recorder-PVCs, no significant arrhythmias or symptoms ;     No Known Allergies   Current Outpatient Prescriptions  Medication Sig Dispense Refill  . atorvastatin (LIPITOR) 40 MG tablet TAKE 1 TABLET BY MOUTH EVERY DAY 90 tablet 1  . atorvastatin (LIPITOR) 80 MG tablet Take 1 tablet (80 mg total) by mouth daily. 90 tablet 3  . benazepril (LOTENSIN) 20 MG tablet Take 1 tablet (20 mg total) by mouth daily. 90 tablet 1  . Calcium Carb-Cholecalciferol (CALCIUM 600+D) 600-800 MG-UNIT TABS Take 1 tablet by mouth daily.    Marland Kitchen diltiazem (CARDIZEM CD) 120 MG 24 hr capsule Take 1 capsule (120 mg total) by mouth daily. 90 capsule 1  . metFORMIN (GLUCOPHAGE) 500 MG tablet Take 1 tablet (500 mg total) by mouth daily with breakfast. 60 tablet 5  . metoprolol tartrate (LOPRESSOR) 100 MG  tablet TAKE 1 TABLET (100 MG TOTAL) BY MOUTH 2 (TWO) TIMES DAILY. 60 tablet 6  . Multiple Vitamins-Minerals ([employer] SPECTRAVITE PO) Take 1 tablet by mouth daily.     . ONE TOUCH ULTRA TEST test strip USE ONCE DAILY DX E11.9 100 each 3  . ONETOUCH DELICA LANCETS 46F MISC Twice daily testing 100 each 3  . rosuvastatin (CRESTOR) 10 MG tablet Take 1 tablet (10 mg total) by mouth daily. 30 tablet 5  . warfarin (COUMADIN) 5 MG tablet TAKE 1 1/2 TABLETS DAILY EXCEPT 1 TABLET ON MONDAYS, WEDNESDAYS AND FRIDAYS 45 tablet 3   No current facility-administered medications for this visit.      Past Surgical History:  Procedure Laterality Date  . ABDOMINAL HYSTERECTOMY    . BREAST BIOPSY Left    benign  . CATARACT EXTRACTION W/PHACO Right 01/08/2016   Procedure: CATARACT EXTRACTION PHACO AND INTRAOCULAR LENS PLACEMENT (IOC);  Surgeon: Rutherford Guys, MD;  Location: [facility];  Service: Ophthalmology;  Laterality: Right;  CDE: 5.78  . CATARACT EXTRACTION W/PHACO Left 02/05/2016   Procedure: CATARACT EXTRACTION PHACO AND INTRAOCULAR LENS PLACEMENT LEFT EYE CDE=6.50;  Surgeon: Rutherford Guys, MD;  Location: [facility];  Service: Ophthalmology;  Laterality: Left;  . COLONOSCOPY  2006   Dr. Tamala Julian: Normal, repeat in 2009.  Marland Kitchen COLONOSCOPY N/A 10/08/2015   Procedure: COLONOSCOPY;  Surgeon: Carlyon Prows  Rexene Edison, MD;  Location: [facility];  Service: Endoscopy;  Laterality: N/A;  230 - moved to 1:30, spoke with pt   . POLYPECTOMY  10/08/2015   Procedure: POLYPECTOMY;  Surgeon: Danie Binder, MD;  Location[facility];  Service: Endoscopy;;  cecal polyp removed via cold forceps with one clip placed/ rectal polyp removed via cold forcep  . SHOULDER SURGERY  2006   Left-post-trauma  . TOTAL ABDOMINAL HYSTERECTOMY W/ BILATERAL SALPINGOOPHORECTOMY       No Known Allergies    Family History  Problem Relation Age of Onset  . Hypertension Mother   . Arthritis Mother   . Stroke Father   . Hypertension Father   . Coronary  artery disease Father   . Diabetes Sister   . Sarcoidosis Sister   . Diabetes Sister   . Heart failure Brother   . Congestive Heart Failure Brother   . Colon cancer Neg Hx      Social History Ms. Onstad reports that she has never smoked. She has never used smokeless tobacco. Ms. Tedesco reports that she does not drink alcohol.   Review of Systems CONSTITUTIONAL: No weight loss, fever, chills, weakness or fatigue.  HEENT: Eyes: No visual loss, blurred vision, double vision or yellow sclerae.No hearing loss, sneezing, congestion, runny nose or sore throat.  SKIN: No rash or itching.  CARDIOVASCULAR: per hpi RESPIRATORY: No shortness of breath, cough or sputum.  GASTROINTESTINAL: No anorexia, nausea, vomiting or diarrhea. No abdominal pain or blood.  GENITOURINARY: No burning on urination, no polyuria NEUROLOGICAL: No headache, dizziness, syncope, paralysis, ataxia, numbness or tingling in the extremities. No change in bowel or bladder control.  MUSCULOSKELETAL: No muscle, back pain, joint pain or stiffness.  LYMPHATICS: No enlarged nodes. No history of splenectomy.  PSYCHIATRIC: No history of depression or anxiety.  ENDOCRINOLOGIC: No reports of sweating, cold or heat intolerance. No polyuria or polydipsia.  Marland Kitchen   Physical Examination Vitals:   03/20/17 1039  BP: 138/84  Pulse: 78  SpO2: 98%   Vitals:   03/20/17 1039  Weight: 202 lb (91.6 kg)  Height: 5\' 5"  (1.651 m)    Gen: resting comfortably, no acute distress HEENT: no scleral icterus, pupils equal round and reactive, no palptable cervical adenopathy,  CV: irreg, no m/r/g, no jvd Resp: Clear to auscultation bilaterally GI: abdomen is soft, non-tender, non-distended, normal bowel sounds, no hepatosplenomegaly MSK: extremities are warm, no edema.  Skin: warm, no rash Neuro:  no focal deficits Psych: appropriate affect   Diagnostic Studies 07/12/12 Echo: LVEF 55-60%, mild basal septal hypertrophy,   07/11/12  Carotid US: <50% bilateral stenosis, 3.4 cm thyroid nodule     Assessment and Plan  1. Chest pain - no recent symptoms, we will conitnue to monitor  2. Parox afib  - no recent symptoms -she will continue current meds. CHADS2Vasc score is 3, continue anticoag - she will conitue current meds - EKG today shows sinus rhythm  3. HTN:  - she is at goal, continue current meds   4. HL:  -not at goal, increase atorva to 80mg  daily  5. Leg edema - fairly mild, infrequent. We will give Rx for lasix 20mg  prn. If progresses consider repeat ehco.   F/u 6 months  Arnoldo Lenis, M.D.

## 2017-03-20 NOTE — Patient Instructions (Signed)
Medication Instructions:  Increase atorvastatin to 80 mg daily  Start lasix 20 mg daily AS NEEDED FOR SWELLING   Labwork: NONE  Testing/Procedures: NONE  Follow-Up: Your physician wants you to follow-up in: 6 MONTHS.  You will receive a reminder letter in the mail two months in advance. If you don't receive a letter, please call our office to schedule the follow-up appointment.   Any Other Special Instructions Will Be Listed Below (If Applicable).     If you need a refill on your cardiac medications before your next appointment, please call your pharmacy.

## 2017-04-13 ENCOUNTER — Ambulatory Visit (INDEPENDENT_AMBULATORY_CARE_PROVIDER_SITE_OTHER): Payer: Medicare Other | Admitting: *Deleted

## 2017-04-13 DIAGNOSIS — Z5181 Encounter for therapeutic drug level monitoring: Secondary | ICD-10-CM | POA: Diagnosis not present

## 2017-04-13 DIAGNOSIS — I480 Paroxysmal atrial fibrillation: Secondary | ICD-10-CM | POA: Diagnosis not present

## 2017-04-13 DIAGNOSIS — Z23 Encounter for immunization: Secondary | ICD-10-CM | POA: Diagnosis not present

## 2017-04-13 LAB — POCT INR: INR: 4.5

## 2017-04-27 ENCOUNTER — Ambulatory Visit (INDEPENDENT_AMBULATORY_CARE_PROVIDER_SITE_OTHER): Payer: Medicare Other | Admitting: *Deleted

## 2017-04-27 DIAGNOSIS — I480 Paroxysmal atrial fibrillation: Secondary | ICD-10-CM | POA: Diagnosis not present

## 2017-04-27 DIAGNOSIS — Z5181 Encounter for therapeutic drug level monitoring: Secondary | ICD-10-CM | POA: Diagnosis not present

## 2017-04-27 LAB — POCT INR: INR: 3.2

## 2017-05-29 ENCOUNTER — Ambulatory Visit (INDEPENDENT_AMBULATORY_CARE_PROVIDER_SITE_OTHER): Payer: Medicare Other | Admitting: *Deleted

## 2017-05-29 DIAGNOSIS — Z5181 Encounter for therapeutic drug level monitoring: Secondary | ICD-10-CM

## 2017-05-29 DIAGNOSIS — I480 Paroxysmal atrial fibrillation: Secondary | ICD-10-CM

## 2017-05-29 LAB — POCT INR: INR: 4

## 2017-06-01 ENCOUNTER — Ambulatory Visit (INDEPENDENT_AMBULATORY_CARE_PROVIDER_SITE_OTHER): Payer: Medicare Other | Admitting: Otolaryngology

## 2017-06-12 ENCOUNTER — Ambulatory Visit (INDEPENDENT_AMBULATORY_CARE_PROVIDER_SITE_OTHER): Payer: Medicare Other | Admitting: *Deleted

## 2017-06-12 DIAGNOSIS — Z5181 Encounter for therapeutic drug level monitoring: Secondary | ICD-10-CM | POA: Diagnosis not present

## 2017-06-12 DIAGNOSIS — I480 Paroxysmal atrial fibrillation: Secondary | ICD-10-CM

## 2017-06-12 LAB — POCT INR: INR: 2.2

## 2017-06-12 NOTE — Patient Instructions (Signed)
Continue coumadin 1 tablet daily  Recheck in 4 wks

## 2017-06-15 ENCOUNTER — Other Ambulatory Visit: Payer: Self-pay

## 2017-06-15 ENCOUNTER — Encounter: Payer: Self-pay | Admitting: Family Medicine

## 2017-06-15 ENCOUNTER — Ambulatory Visit (INDEPENDENT_AMBULATORY_CARE_PROVIDER_SITE_OTHER): Payer: Medicare Other | Admitting: Family Medicine

## 2017-06-15 VITALS — BP 130/80 | HR 82 | Temp 98.9°F | Resp 16 | Ht 63.0 in | Wt 200.0 lb

## 2017-06-15 DIAGNOSIS — Z0000 Encounter for general adult medical examination without abnormal findings: Secondary | ICD-10-CM | POA: Diagnosis not present

## 2017-06-15 DIAGNOSIS — Z1211 Encounter for screening for malignant neoplasm of colon: Secondary | ICD-10-CM | POA: Diagnosis not present

## 2017-06-15 LAB — POC HEMOCCULT BLD/STL (OFFICE/1-CARD/DIAGNOSTIC): Fecal Occult Blood, POC: NEGATIVE

## 2017-06-15 NOTE — Progress Notes (Addendum)
    Samantha Vega     MRN: [id number]      DOB: [birth year]  HPI: Patient is in for annual physical exam. No other health concerns are expressed or addressed at the visit. Recent labs, if available are reviewed. Immunization is reviewed , and  updated if needed.   PE: BP 130/80 (BP Location: Left Arm, Patient Position: Sitting, Cuff Size: Normal)   Pulse 82   Temp 98.9 F (37.2 C) (Other (Comment))   Resp 16   Ht 5\' 3"  (1.6 m)   Wt 200 lb (90.7 kg)   SpO2 97%   BMI 35.43 kg/m   Pleasant  female, alert and oriented x 3, in no cardio-pulmonary distress. Afebrile. HEENT No facial trauma or asymetry. Sinuses non tender.  Extra occullar muscles intact. External ears normal, tympanic membranes clear. Oropharynx moist, no exudate. Neck: supple, no adenopathy,JVD or thyromegaly.No bruits.  Chest: Clear to ascultation bilaterally.No crackles or wheezes. Non tender to palpation  Breast: No asymetry,no masses or lumps. No tenderness. No nipple discharge or inversion. No axillary or supraclavicular adenopathy  Cardiovascular system; Heart sounds normal,  S1 and  S2 ,no S3.  No murmur, or thrill. Apical beat not displaced Peripheral pulses normal.  Abdomen: Soft, non tender, no organomegaly or masses. No bruits. Bowel sounds normal. No guarding, tenderness or rebound.  Rectal:  Normal sphincter tone. No rectal mass. Guaiac negative stool.  GU: Not examined  Musculoskeletal exam: Reduced but adequate ROM of spine, hips , shoulders and knees. No deformity ,swelling or crepitus noted. No muscle wasting or atrophy.   Neurologic: Cranial nerves 2 to 12 intact. Power, tone ,sensation and reflexes normal throughout. No disturbance in gait. No tremor.  Skin: Intact, no ulceration, erythema , scaling or rash noted. Pigmentation normal throughout  Psych; Normal mood and affect. Judgement and concentration normal   Assessment & Plan:  Annual physical  exam Annual exam as documented. Counseling done  re healthy lifestyle involving commitment to 150 minutes exercise per week, heart healthy diet, and attaining healthy weight.The importance of adequate sleep also discussed. Regular seat belt use and home safety, is also discussed. Changes in health habits are decided on by the patient with goals and time frames  set for achieving them. Immunization and cancer screening needs are specifically addressed at this visit.

## 2017-06-15 NOTE — Assessment & Plan Note (Signed)

## 2017-06-15 NOTE — Patient Instructions (Signed)
Wellness with nurse 10/10/2016 or after   MD f/u  End August or early September  CBC, lipid, cmp and eGFr, tSDH, ha1c and VitD today  Rectal exam and eye screen today for physical exam  It is important that you exercise regularly at least 30 minutes 5 times a week. If you develop chest pain, have severe difficulty breathing, or feel very tired, stop exercising immediately and seek medical attention  Please work on good  health habits so that your health will improve. 1. Commitment to daily physical activity for 30 to 60  minutes, if you are able to do this.  2. Commitment to wise food choices. Aim for half of your  food intake to be vegetable and fruit, one quarter starchy foods, and one quarter protein. Try to eat on a regular schedule  3 meals per day, snacking between meals should be limited to vegetables or fruits or small portions of nuts. 64 ounces of water per day is generally recommended, unless you have specific health conditions, like heart failure or kidney failure where you will need to limit fluid intake.  3. Commitment to sufficient and a  good quality of physical and mental rest daily, generally between 6 to 8 hours per day.  WITH PERSISTANCE AND PERSEVERANCE, THE IMPOSSIBLE , BECOMES THE NORM!   Diabetic foot exam today is good , except reduced circulation and flat feet

## 2017-06-16 ENCOUNTER — Encounter: Payer: Self-pay | Admitting: Family Medicine

## 2017-06-16 LAB — COMPLETE METABOLIC PANEL WITH GFR
AG RATIO: 1.3 (calc) (ref 1.0–2.5)
ALT: 28 U/L (ref 6–29)
AST: 20 U/L (ref 10–35)
Albumin: 3.9 g/dL (ref 3.6–5.1)
Alkaline phosphatase (APISO): 124 U/L (ref 33–130)
BILIRUBIN TOTAL: 0.4 mg/dL (ref 0.2–1.2)
BUN: 18 mg/dL (ref 7–25)
CALCIUM: 9.1 mg/dL (ref 8.6–10.4)
CHLORIDE: 105 mmol/L (ref 98–110)
CO2: 26 mmol/L (ref 20–32)
Creat: 0.79 mg/dL (ref 0.50–0.99)
GFR, EST AFRICAN AMERICAN: 89 mL/min/{1.73_m2} (ref >=60)
GFR, EST NON AFRICAN AMERICAN: 77 mL/min/{1.73_m2} (ref >=60)
GLOBULIN: 3.1 g/dL (ref 1.9–3.7)
Glucose, Bld: 115 mg/dL — ABNORMAL HIGH (ref 65–99)
POTASSIUM: 4 mmol/L (ref 3.5–5.3)
SODIUM: 139 mmol/L (ref 135–146)
Total Protein: 7 g/dL (ref 6.1–8.1)

## 2017-06-16 LAB — CBC
HEMATOCRIT: 40.2 % (ref 35.0–45.0)
HEMOGLOBIN: 13.1 g/dL (ref 11.7–15.5)
MCH: 27.5 pg (ref 27.0–33.0)
MCHC: 32.6 g/dL (ref 32.0–36.0)
MCV: 84.3 fL (ref 80.0–100.0)
MPV: 11.7 fL (ref 7.5–12.5)
Platelets: 218 10*3/uL (ref 140–400)
RBC: 4.77 10*6/uL (ref 3.80–5.10)
RDW: 14.4 % (ref 11.0–15.0)
WBC: 5.2 10*3/uL (ref 3.8–10.8)

## 2017-06-16 LAB — LIPID PANEL
CHOL/HDL RATIO: 4.8 (calc) (ref <5.0)
CHOLESTEROL: 157 mg/dL (ref <200)
HDL: 33 mg/dL — AB (ref >50)
LDL Cholesterol (Calc): 103 mg/dL (calc) — ABNORMAL HIGH
NON-HDL CHOLESTEROL (CALC): 124 mg/dL (ref <130)
TRIGLYCERIDES: 111 mg/dL (ref <150)

## 2017-06-16 LAB — HEMOGLOBIN A1C
HEMOGLOBIN A1C: 6.2 %{Hb} — AB (ref <5.7)
Mean Plasma Glucose: 131 (calc)
eAG (mmol/L): 7.3 (calc)

## 2017-06-16 LAB — TSH: TSH: 1.42 m[IU]/L (ref 0.40–4.50)

## 2017-06-16 LAB — VITAMIN D 25 HYDROXY (VIT D DEFICIENCY, FRACTURES): VIT D 25 HYDROXY: 32 ng/mL (ref 30–100)

## 2017-06-29 ENCOUNTER — Other Ambulatory Visit: Payer: Self-pay | Admitting: Family Medicine

## 2017-07-13 ENCOUNTER — Ambulatory Visit (INDEPENDENT_AMBULATORY_CARE_PROVIDER_SITE_OTHER): Payer: Medicare Other | Admitting: *Deleted

## 2017-07-13 DIAGNOSIS — I480 Paroxysmal atrial fibrillation: Secondary | ICD-10-CM | POA: Diagnosis not present

## 2017-07-13 DIAGNOSIS — Z5181 Encounter for therapeutic drug level monitoring: Secondary | ICD-10-CM | POA: Diagnosis not present

## 2017-07-13 LAB — POCT INR: INR: 1.9

## 2017-07-13 NOTE — Patient Instructions (Signed)
Take coumadin 1 1/2 tablets tonight then resume 1 tablet daily  Recheck in 4 wks

## 2017-07-25 ENCOUNTER — Other Ambulatory Visit: Payer: Self-pay | Admitting: Family Medicine

## 2017-07-27 ENCOUNTER — Ambulatory Visit (INDEPENDENT_AMBULATORY_CARE_PROVIDER_SITE_OTHER): Payer: Medicare Other | Admitting: Family Medicine

## 2017-07-27 ENCOUNTER — Other Ambulatory Visit: Payer: Self-pay

## 2017-07-27 ENCOUNTER — Encounter: Payer: Self-pay | Admitting: Family Medicine

## 2017-07-27 VITALS — BP 148/86 | HR 108 | Temp 97.1°F | Resp 18 | Ht 63.0 in | Wt 203.0 lb

## 2017-07-27 DIAGNOSIS — R05 Cough: Secondary | ICD-10-CM

## 2017-07-27 DIAGNOSIS — J208 Acute bronchitis due to other specified organisms: Secondary | ICD-10-CM | POA: Diagnosis not present

## 2017-07-27 DIAGNOSIS — R059 Cough, unspecified: Secondary | ICD-10-CM

## 2017-07-27 MED ORDER — BENZONATATE 200 MG PO CAPS: 200.0000 mg | ORAL_CAPSULE | Freq: Two times a day (BID) | ORAL | 0 refills | Status: DC | PRN

## 2017-07-27 MED ORDER — PREDNISONE 20 MG PO TABS: 20.0000 mg | ORAL_TABLET | Freq: Every day | ORAL | 0 refills | Status: DC

## 2017-07-27 NOTE — Patient Instructions (Signed)
Rest Push fluids Take the tessalon for cough Take the prednisone for 5 days Call if not better by end of the week

## 2017-07-27 NOTE — Progress Notes (Signed)
Chief Complaint  Patient presents with  . Cough    x 5 days   PCP is Tula Nakayama Patient is here today worked in for an acute respiratory illness She states that she is been sick for about 6 days.  She started off with runny and stuffy nose, postnasal drip, "cold", with some green nasal drainage.  She has had some scant bleeding from the left nostril.  Mild sore throat.  No ear pain.  No headache.  No fever or chills.  She also has had a cough.  The head symptoms are improving, but the cough is keeping her awake at night.  She is here today for evaluation desperate to get some sleep.  No known underlying allergies.  No known asthma or lung disease.  No wheezing , feels chest is congested and tight.  No chest pain.  No myalgias or fatigue.  Patient Active Problem List   Diagnosis Date Noted  . Tubular adenoma of colon 10/09/2015  . Annual physical exam 06/07/2015  . Baker's cyst of knee 10/04/2013  . Metabolic syndrome X 36/14/4315  . Encounter for therapeutic drug monitoring 08/25/2013  . Chronic anticoagulation 09/16/2012  . Paroxysmal atrial fibrillation ([place]) 05/22/2012  . OA (osteoarthritis) of knee 05/05/2012  . RBBB (right bundle branch block)   . Obesity   . Diabetes mellitus type 2 in obese ([place])   . Unspecified visual loss 12/08/2008  . Essential hypertension 12/08/2008  . Hyperlipidemia LDL goal <100 12/06/2007    Outpatient Encounter Medications as of 07/27/2017  Medication Sig  . benazepril (LOTENSIN) 20 MG tablet Take 1 tablet (20 mg total) by mouth daily.  . Calcium Carb-Cholecalciferol (CALCIUM 600+D) 600-800 MG-UNIT TABS Take 1 tablet by mouth daily.  Marland Kitchen diltiazem (CARDIZEM CD) 120 MG 24 hr capsule Take 1 capsule (120 mg total) by mouth daily.  . furosemide (LASIX) 20 MG tablet Take 1 tablet (20 mg total) by mouth daily as needed.  . metFORMIN (GLUCOPHAGE) 500 MG tablet TAKE 1 TABLET (500 MG TOTAL) BY MOUTH DAILY WITH BREAKFAST.  . metoprolol tartrate  (LOPRESSOR) 100 MG tablet TAKE 1 TABLET (100 MG TOTAL) BY MOUTH 2 (TWO) TIMES DAILY.  . Multiple Vitamins-Minerals ([facility] SPECTRAVITE PO) Take 1 tablet by mouth daily.   . ONE TOUCH ULTRA TEST test strip USE ONCE DAILY DX E11.9  . ONETOUCH DELICA LANCETS 40G MISC Twice daily testing  . warfarin (COUMADIN) 5 MG tablet TAKE 1 1/2 TABLETS DAILY EXCEPT 1 TABLET ON MONDAYS, WEDNESDAYS AND FRIDAYS  . atorvastatin (LIPITOR) 80 MG tablet Take 1 tablet (80 mg total) by mouth daily.  . benzonatate (TESSALON) 200 MG capsule Take 1 capsule (200 mg total) by mouth 2 (two) times daily as needed for cough.  . predniSONE (DELTASONE) 20 MG tablet Take 1 tablet (20 mg total) by mouth daily with breakfast.   No facility-administered encounter medications on file as of 07/27/2017.     No Known Allergies  Review of Systems  Constitutional: Negative for chills, fatigue and fever.  HENT: Positive for congestion, nosebleeds, postnasal drip and rhinorrhea.        Head symptoms improving  Eyes: Negative for redness and visual disturbance.  Respiratory: Positive for cough and chest tightness. Negative for shortness of breath and wheezing.   Cardiovascular: Negative for chest pain and palpitations.  Gastrointestinal: Negative for diarrhea, nausea and vomiting.  Musculoskeletal: Negative for arthralgias and myalgias.  Neurological: Negative for dizziness and headaches.  Psychiatric/Behavioral: Positive for sleep disturbance.  BP (!) 148/86 (BP Location: Right Arm, Patient Position: Sitting, Cuff Size: Large)   Pulse (!) 108   Temp (!) 97.1 F (36.2 C) (Temporal)   Resp 18   Ht 5\' 3"  (1.6 m)   Wt 203 lb 0.6 oz (92.1 kg)   SpO2 98%   BMI 35.97 kg/m   Physical Exam  Constitutional: She is oriented to person, place, and time. She appears well-developed and well-nourished.  HENT:  Head: Normocephalic and atraumatic.  Left Ear: External ear normal.  Mouth/Throat: Oropharynx is clear and moist. No  oropharyngeal exudate.  Right TM occluded by cerumen  Eyes: Conjunctivae are normal. Pupils are equal, round, and reactive to light.  Neck: Normal range of motion. Neck supple. No thyromegaly present.  Small diffuse anterior cervical nodes, nontender  Cardiovascular: Normal rate.  Irregular/irregular  Pulmonary/Chest: Effort normal. No respiratory distress. She has wheezes.  Few scattered central wheeze with coughing  Abdominal: Soft. Bowel sounds are normal.  Musculoskeletal: Normal range of motion. She exhibits no edema.  Lymphadenopathy:    She has cervical adenopathy.  Neurological: She is alert and oriented to person, place, and time.  Gait normal  Skin: Skin is warm and dry.  Psychiatric: She has a normal mood and affect. Her behavior is normal. Thought content normal.  Nursing note and vitals reviewed.   ASSESSMENT/PLAN:  1. Cough in adult patient   2. Viral bronchitis Discussed with patient and her husband that most cough and cold symptoms are caused by virus.  Antibiotic will not help her to get better faster.  I will give her Tessalon to help control the coughing spells.  Giving her 5 days of prednisone just because I heard wheezing and she complains of some chest tightness.  I think this will help with bronchial inflammation.  She is going to push fluids.  Call if not better by the end of the week.  She is also specifically counseled that if she gets worse instead of better or spikes a high fever that she needs to call sooner   Patient Instructions  Rest Push fluids Take the tessalon for cough Take the prednisone for 5 days Call if not better by end of the week   Raylene Everts, MD

## 2017-08-10 ENCOUNTER — Ambulatory Visit (INDEPENDENT_AMBULATORY_CARE_PROVIDER_SITE_OTHER): Payer: Medicare Other | Admitting: *Deleted

## 2017-08-10 DIAGNOSIS — Z5181 Encounter for therapeutic drug level monitoring: Secondary | ICD-10-CM | POA: Diagnosis not present

## 2017-08-10 DIAGNOSIS — I480 Paroxysmal atrial fibrillation: Secondary | ICD-10-CM

## 2017-08-10 LAB — POCT INR: INR: 4

## 2017-08-10 NOTE — Patient Instructions (Signed)
Hold coumadin tonight then resume 1 tablet daily  Recheck in 3 wks 

## 2017-08-31 ENCOUNTER — Ambulatory Visit (INDEPENDENT_AMBULATORY_CARE_PROVIDER_SITE_OTHER): Payer: Medicare Other | Admitting: *Deleted

## 2017-08-31 DIAGNOSIS — I480 Paroxysmal atrial fibrillation: Secondary | ICD-10-CM | POA: Diagnosis not present

## 2017-08-31 DIAGNOSIS — Z5181 Encounter for therapeutic drug level monitoring: Secondary | ICD-10-CM

## 2017-08-31 LAB — POCT INR: INR: 3.8

## 2017-08-31 NOTE — Patient Instructions (Signed)
Hold coumadin tonight then decrease dose to 1 tablet daily except 1/2 tablet on Mondays and Thursdays Recheck in 3 wks  

## 2017-09-21 ENCOUNTER — Ambulatory Visit (INDEPENDENT_AMBULATORY_CARE_PROVIDER_SITE_OTHER): Payer: Medicare Other | Admitting: *Deleted

## 2017-09-21 DIAGNOSIS — I480 Paroxysmal atrial fibrillation: Secondary | ICD-10-CM

## 2017-09-21 DIAGNOSIS — Z5181 Encounter for therapeutic drug level monitoring: Secondary | ICD-10-CM

## 2017-09-21 LAB — POCT INR: INR: 3.8

## 2017-09-21 NOTE — Patient Instructions (Signed)
Hold coumadin tonight then decrease dose to 1/2 tablet daily except 1 tablet on Mondays, Wednesdays and Fridays  Recheck in 3 wks

## 2017-09-29 ENCOUNTER — Other Ambulatory Visit: Payer: Self-pay | Admitting: Cardiology

## 2017-10-12 ENCOUNTER — Ambulatory Visit (INDEPENDENT_AMBULATORY_CARE_PROVIDER_SITE_OTHER): Payer: Medicare Other | Admitting: *Deleted

## 2017-10-12 ENCOUNTER — Other Ambulatory Visit: Payer: Self-pay | Admitting: Family Medicine

## 2017-10-12 DIAGNOSIS — Z5181 Encounter for therapeutic drug level monitoring: Secondary | ICD-10-CM | POA: Diagnosis not present

## 2017-10-12 DIAGNOSIS — Z1231 Encounter for screening mammogram for malignant neoplasm of breast: Secondary | ICD-10-CM

## 2017-10-12 DIAGNOSIS — I480 Paroxysmal atrial fibrillation: Secondary | ICD-10-CM

## 2017-10-12 LAB — POCT INR: INR: 1.7

## 2017-10-12 NOTE — Patient Instructions (Signed)
Increase coumadin to 1 tablet daily except 1/2 tablet on Sundays, Tuesdays and Thursdays Recheck in 3 wks

## 2017-11-02 ENCOUNTER — Ambulatory Visit (INDEPENDENT_AMBULATORY_CARE_PROVIDER_SITE_OTHER): Payer: Medicare Other | Admitting: *Deleted

## 2017-11-02 DIAGNOSIS — I480 Paroxysmal atrial fibrillation: Secondary | ICD-10-CM

## 2017-11-02 DIAGNOSIS — Z5181 Encounter for therapeutic drug level monitoring: Secondary | ICD-10-CM

## 2017-11-02 LAB — POCT INR: INR: 1.5

## 2017-11-02 NOTE — Patient Instructions (Signed)
Increase coumadin to 1 tablet daily except 1/2 tablet on Sundays and Thursdays Recheck in 2 wks

## 2017-11-05 ENCOUNTER — Ambulatory Visit (HOSPITAL_COMMUNITY)
Admission: RE | Admit: 2017-11-05 | Discharge: 2017-11-05 | Disposition: A | Discharge status: Home / Self Care | Payer: Medicare Other | Source: Ambulatory Visit | Attending: Family Medicine | Admitting: Family Medicine

## 2017-11-05 ENCOUNTER — Encounter (HOSPITAL_COMMUNITY): Payer: Self-pay

## 2017-11-05 DIAGNOSIS — Z1231 Encounter for screening mammogram for malignant neoplasm of breast: Secondary | ICD-10-CM | POA: Diagnosis not present

## 2017-11-16 ENCOUNTER — Ambulatory Visit (INDEPENDENT_AMBULATORY_CARE_PROVIDER_SITE_OTHER): Payer: Medicare Other | Admitting: *Deleted

## 2017-11-16 DIAGNOSIS — I480 Paroxysmal atrial fibrillation: Secondary | ICD-10-CM | POA: Diagnosis not present

## 2017-11-16 DIAGNOSIS — Z5181 Encounter for therapeutic drug level monitoring: Secondary | ICD-10-CM | POA: Diagnosis not present

## 2017-11-16 LAB — POCT INR: INR: 2.3 (ref 2.0–3.0)

## 2017-11-16 NOTE — Patient Instructions (Signed)
Continue coumadin 1 tablet daily except 1/2 tablet on Sundays and Thursdays Recheck in 3 wks

## 2017-12-07 ENCOUNTER — Other Ambulatory Visit: Payer: Self-pay | Admitting: Family Medicine

## 2017-12-11 ENCOUNTER — Ambulatory Visit (INDEPENDENT_AMBULATORY_CARE_PROVIDER_SITE_OTHER): Payer: Medicare Other | Admitting: *Deleted

## 2017-12-11 DIAGNOSIS — I480 Paroxysmal atrial fibrillation: Secondary | ICD-10-CM

## 2017-12-11 DIAGNOSIS — Z5181 Encounter for therapeutic drug level monitoring: Secondary | ICD-10-CM

## 2017-12-11 LAB — POCT INR: INR: 2.3 (ref 2.0–3.0)

## 2017-12-11 NOTE — Patient Instructions (Signed)
Continue coumadin 1 tablet daily except 1/2 tablet on Sundays and Thursdays Recheck in 4 wks

## 2017-12-26 ENCOUNTER — Other Ambulatory Visit: Payer: Self-pay | Admitting: Cardiology

## 2017-12-28 ENCOUNTER — Ambulatory Visit: Payer: Medicare Other

## 2018-01-07 ENCOUNTER — Ambulatory Visit: Payer: Medicare Other | Admitting: Cardiology

## 2018-01-11 ENCOUNTER — Ambulatory Visit (INDEPENDENT_AMBULATORY_CARE_PROVIDER_SITE_OTHER): Payer: Medicare Other | Admitting: *Deleted

## 2018-01-11 DIAGNOSIS — I480 Paroxysmal atrial fibrillation: Secondary | ICD-10-CM

## 2018-01-11 DIAGNOSIS — Z5181 Encounter for therapeutic drug level monitoring: Secondary | ICD-10-CM

## 2018-01-11 LAB — POCT INR: INR: 2.5 (ref 2.0–3.0)

## 2018-01-11 NOTE — Patient Instructions (Signed)
Continue coumadin 1 tablet daily except 1/2 tablet on Sundays and Thursdays Recheck in 4 wks

## 2018-01-12 ENCOUNTER — Ambulatory Visit (INDEPENDENT_AMBULATORY_CARE_PROVIDER_SITE_OTHER): Payer: Medicare Other

## 2018-01-12 VITALS — BP 120/82 | HR 69 | Resp 16 | Ht 63.0 in | Wt 208.0 lb

## 2018-01-12 DIAGNOSIS — Z0000 Encounter for general adult medical examination without abnormal findings: Secondary | ICD-10-CM | POA: Diagnosis not present

## 2018-01-12 DIAGNOSIS — Z780 Asymptomatic menopausal state: Secondary | ICD-10-CM

## 2018-01-12 MED ORDER — DILTIAZEM HCL ER COATED BEADS 120 MG PO CP24: 120.0000 mg | ORAL_CAPSULE | Freq: Every day | ORAL | 1 refills | Status: DC

## 2018-01-12 MED ORDER — BLOOD GLUCOSE METER KIT: PACK | 5 refills | Status: DC

## 2018-01-12 MED ORDER — BENAZEPRIL HCL 20 MG PO TABS: 20.0000 mg | ORAL_TABLET | Freq: Every day | ORAL | 1 refills | Status: DC

## 2018-01-12 NOTE — Patient Instructions (Signed)
Samantha Vega , Thank you for taking time to come for your Medicare Wellness Visit. I appreciate your ongoing commitment to your health goals. Please review the following plan we discussed and let me know if I can assist you in the future.   SCHEDULE YOUR BONE DENSITY AT CHECKOUT YOUR RE[facility]LLS [facility]   Screening recommendations/referrals: Colonoscopy: Done   Mammogram: Done Bone Density: Referral entered Recommended yearly ophthalmology/optometry visit for glaucoma screening and checkup Recommended yearly dental visit for hygiene and checkup  Vaccinations: Influenza vaccine: Due In Sept  Pneumococcal vaccine: Done Tdap vaccine: Done Shingles vaccine: Ask insurance  Advanced directives: form given   Conditions/risks identified: done   Next appointment: 02/25/18 8:20 am    Preventive Care 69 Years and Older, Female Preventive care refers to lifestyle choices and visits with your health care provider that can promote health and wellness. What does preventive care include?  A yearly physical exam. This is also called an annual well check.  Dental exams once or twice a year.  Routine eye exams. Ask your health care provider how often you should have your eyes checked.  Personal lifestyle choices, including:  Daily care of your teeth and gums.  Regular physical activity.  Eating a healthy diet.  Avoiding tobacco and drug use.  Limiting alcohol use.  Practicing safe sex.  Taking low-dose aspirin every day.  Taking vitamin and mineral supplements as recommended by your health care provider. What happens during an annual well check? The services and screenings done by your health care provider during your annual well check will depend on your age, overall health, lifestyle risk factors, and family history of disease. Counseling  Your health care provider may ask you questions about your:  Alcohol use.  Tobacco use.  Drug use.  Emotional  well-being.  Home and relationship well-being.  Sexual activity.  Eating habits.  History of falls.  Memory and ability to understand (cognition).  Work and work Statistician.  Reproductive health. Screening  You may have the following tests or measurements:  Height, weight, and BMI.  Blood pressure.  Lipid and cholesterol levels. These may be checked every 5 years, or more frequently if you are over 5 years old.  Skin check.  Lung cancer screening. You may have this screening every year starting at age 10 if you have a 30-pack-year history of smoking and currently smoke or have quit within the past 15 years.  Fecal occult blood test (FOBT) of the stool. You may have this test every year starting at age 41.  Flexible sigmoidoscopy or colonoscopy. You may have a sigmoidoscopy every 5 years or a colonoscopy every 10 years starting at age 54.  Hepatitis C blood test.  Hepatitis B blood test.  Sexually transmitted disease (STD) testing.  Diabetes screening. This is done by checking your blood sugar (glucose) after you have not eaten for a while (fasting). You may have this done every 1-3 years.  Bone density scan. This is done to screen for osteoporosis. You may have this done starting at age 33.  Mammogram. This may be done every 1-2 years. Talk to your health care provider about how often you should have regular mammograms. Talk with your health care provider about your test results, treatment options, and if necessary, the need for more tests. Vaccines  Your health care provider may recommend certain vaccines, such as:  Influenza vaccine. This is recommended every year.  Tetanus, diphtheria, and acellular pertussis (Tdap, Td) vaccine.  You may need a Td booster every 10 years.  Zoster vaccine. You may need this after age 22.  Pneumococcal 13-valent conjugate (PCV13) vaccine. One dose is recommended after age 76.  Pneumococcal polysaccharide (PPSV23) vaccine. One  dose is recommended after age 69. Talk to your health care provider about which screenings and vaccines you need and how often you need them. This information is not intended to replace advice given to you by your health care provider. Make sure you discuss any questions you have with your health care provider. Document Released: 06/29/2015 Document Revised: 02/20/2016 Document Reviewed: 04/03/2015 Elsevier Interactive Patient Education  2017 [place] Prevention in the Home Falls can cause injuries. They can happen to people of all ages. There are many things you can do to make your home safe and to help prevent falls. What can I do on the outside of my home?  Regularly fix the edges of walkways and driveways and fix any cracks.  Remove anything that might make you trip as you walk through a door, such as a raised step or threshold.  Trim any bushes or trees on the path to your home.  Use bright outdoor lighting.  Clear any walking paths of anything that might make someone trip, such as rocks or tools.  Regularly check to see if handrails are loose or broken. Make sure that both sides of any steps have handrails.  Any raised decks and porches should have guardrails on the edges.  Have any leaves, snow, or ice cleared regularly.  Use sand or salt on walking paths during winter.  Clean up any spills in your garage right away. This includes oil or grease spills. What can I do in the bathroom?  Use night lights.  Install grab bars by the toilet and in the tub and shower. Do not use towel bars as grab bars.  Use non-skid mats or decals in the tub or shower.  If you need to sit down in the shower, use a plastic, non-slip stool.  Keep the floor dry. Clean up any water that spills on the floor as soon as it happens.  Remove soap buildup in the tub or shower regularly.  Attach bath mats securely with double-sided non-slip rug tape.  Do not have throw rugs and other  things on the floor that can make you trip. What can I do in the bedroom?  Use night lights.  Make sure that you have a light by your bed that is easy to reach.  Do not use any sheets or blankets that are too big for your bed. They should not hang down onto the floor.  Have a firm chair that has side arms. You can use this for support while you get dressed.  Do not have throw rugs and other things on the floor that can make you trip. What can I do in the kitchen?  Clean up any spills right away.  Avoid walking on wet floors.  Keep items that you use a lot in easy-to-reach places.  If you need to reach something above you, use a strong step stool that has a grab bar.  Keep electrical cords out of the way.  Do not use floor polish or wax that makes floors slippery. If you must use wax, use non-skid floor wax.  Do not have throw rugs and other things on the floor that can make you trip. What can I do with my stairs?  Do not leave any  items on the stairs.  Make sure that there are handrails on both sides of the stairs and use them. Fix handrails that are broken or loose. Make sure that handrails are as long as the stairways.  Check any carpeting to make sure that it is firmly attached to the stairs. Fix any carpet that is loose or worn.  Avoid having throw rugs at the top or bottom of the stairs. If you do have throw rugs, attach them to the floor with carpet tape.  Make sure that you have a light switch at the top of the stairs and the bottom of the stairs. If you do not have them, ask someone to add them for you. What else can I do to help prevent falls?  Wear shoes that:  Do not have high heels.  Have rubber bottoms.  Are comfortable and fit you well.  Are closed at the toe. Do not wear sandals.  If you use a stepladder:  Make sure that it is fully opened. Do not climb a closed stepladder.  Make sure that both sides of the stepladder are locked into place.  Ask  someone to hold it for you, if possible.  Clearly mark and make sure that you can see:  Any grab bars or handrails.  First and last steps.  Where the edge of each step is.  Use tools that help you move around (mobility aids) if they are needed. These include:  Canes.  Walkers.  Scooters.  Crutches.  Turn on the lights when you go into a dark area. Replace any light bulbs as soon as they burn out.  Set up your furniture so you have a clear path. Avoid moving your furniture around.  If any of your floors are uneven, fix them.  If there are any pets around you, be aware of where they are.  Review your medicines with your doctor. Some medicines can make you feel dizzy. This can increase your chance of falling. Ask your doctor what other things that you can do to help prevent falls. This information is not intended to replace advice given to you by your health care provider. Make sure you discuss any questions you have with your health care provider. Document Released: 03/29/2009 Document Revised: 11/08/2015 Document Reviewed: 07/07/2014 Elsevier Interactive Patient Education  2017 [employer].

## 2018-01-12 NOTE — Progress Notes (Signed)
Subjective:   AURIEL KIST is a 67 y.o. female who presents for Medicare Annual (Subsequent) preventive examination.  Review of Systems:   Cardiac Risk Factors include: advanced age (>59mn, >>30women);diabetes mellitus;hypertension;obesity (BMI >30kg/m2);sedentary lifestyle     Objective:     Vitals: BP 120/82   Pulse 69   Resp 16   Ht _0  (1.6 m)   Wt 208 lb (94.3 kg)   SpO2 97%   BMI 36.85 kg/m   Body mass index is 36.85 kg/m.  Advanced Directives 02/19/2017 10/09/2016 02/05/2016 01/03/2016 10/08/2015 09/06/2014 07/11/2012  Does Patient Have a Medical Advance Directive? _1  No Patient does not have advance directive;Patient would not like information  Would patient like information on creating a medical advance directive? - Yes (MAU/Ambulatory/Procedural Areas - Information given) No - patient declined information No - patient declined information No - patient declined information Yes - Educational materials given -  Pre-existing out of facility DNR order (yellow form or pink MOST form) - - - - - - No    Tobacco Social History   Tobacco Use  Smoking Status Never Smoker  Smokeless Tobacco Never Used     Counseling given: Not Answered   Clinical Intake:  Pre-visit preparation completed: Yes  Pain : No/denies pain Pain Score: 0-No pain     Nutritional Status: BMI > 30  Obese Diabetes: Yes CBG done?: No Did pt. bring in CBG monitor from home?: No  How often do you need to have someone help you when you read instructions, pamphlets, or other written materials from your doctor or pharmacy?: 1 - Never What is the last grade level you completed in school?: 10th grade  Interpreter Needed?: No  Information entered by :: Maisie Hauser lpn   Past Medical History:  Diagnosis Date  . Arthritis   . Chest pain    Noncardiac; evaluated and treated in 2008  . Diabetes mellitus   . Enlarged heart   . Hyperlipidemia   . Hypertension   . Obesity   . [place]   . Right bundle branch block    +Palpitations.   EKG 11/2006:  NSR, RBBB, LAFB, markedly delayed R-wave progression, no change; Echocardiogram in 10/2006-suboptimal quality, normal; Event recorder-PVCs, no significant arrhythmias or symptoms ;   Past Surgical History:  Procedure Laterality Date  . ABDOMINAL HYSTERECTOMY    . BREAST BIOPSY Left    benign  . CATARACT EXTRACTION W/PHACO Right 01/08/2016   Procedure: CATARACT EXTRACTION PHACO AND INTRAOCULAR LENS PLACEMENT (IOC);  Surgeon: MRutherford Guys MD;  Location: [facility];  Service: Ophthalmology;  Laterality: Right;  CDE: 5.78  . CATARACT EXTRACTION W/PHACO Left 02/05/2016   Procedure: CATARACT EXTRACTION PHACO AND INTRAOCULAR LENS PLACEMENT LEFT EYE CDE=6.50;  Surgeon: MRutherford Guys MD;  Location: [facility];  Service: Ophthalmology;  Laterality: Left;  . COLONOSCOPY  2006   Dr. STamala Julian Normal, repeat in 2009.  .Marland KitchenCOLONOSCOPY N/A 10/08/2015   Procedure: COLONOSCOPY;  Surgeon: SDanie Binder MD;  [facility];  Service: Endoscopy;  Laterality: N/A;  230 - moved to 1:30, spoke with pt   . POLYPECTOMY  10/08/2015   Procedure: POLYPECTOMY;  Surgeon: SDanie Binder MD;  [facility];  Service: Endoscopy;;  cecal polyp removed via cold forceps with one clip placed/ rectal polyp removed via cold forcep  . SHOULDER SURGERY  2006   Left-post-trauma  . TOTAL ABDOMINAL HYSTERECTOMY W/ BILATERAL SALPINGOOPHORECTOMY  Family History  Problem Relation Age of Onset  . Hypertension Mother   . Arthritis Mother   . Stroke Father   . Hypertension Father   . Coronary artery disease Father   . Diabetes Sister   . Sarcoidosis Sister   . Diabetes Sister   . Heart failure Brother   . Congestive Heart Failure Brother   . Colon cancer Neg Hx    Social History   Socioeconomic History  . Marital status: Married    Spouse name: Not on file  . Number of children: 0  . Years of education: Not on  file  . Highest education level: Not on file  Occupational History  . Occupation: Museum/gallery exhibitions officer: UNEMPLOYED  Social Needs  . Financial resource strain: Not hard at all  . Food insecurity:    Worry: Never true    Inability: Never true  . Transportation needs:    Medical: No    Non-medical: No  Tobacco Use  . Smoking status: Never Smoker  . Smokeless tobacco: Never Used  Substance and Sexual Activity  . Alcohol use: No  . Drug use: No  . Sexual activity: Yes    Birth control/protection: Surgical  Lifestyle  . Physical activity:    Days per week: 2 days    Minutes per session: 30 min  . Stress: Not at all  Relationships  . Social connections:    Talks on phone: More than three times a week    Gets together: More than three times a week    Attends religious service: More than 4 times per year    Active member of club or organization: No    Attends meetings of clubs or organizations: Never    Relationship status: Married  Other Topics Concern  . Not on file  Social History Narrative  . Not on file    Outpatient Encounter Medications as of 01/12/2018  Medication Sig  . atorvastatin (LIPITOR) 80 MG tablet Take 1 tablet (80 mg total) by mouth daily.  . benazepril (LOTENSIN) 20 MG tablet Take 1 tablet (20 mg total) by mouth daily.  . benzonatate (TESSALON) 200 MG capsule Take 1 capsule (200 mg total) by mouth 2 (two) times daily as needed for cough.  . Calcium Carb-Cholecalciferol (CALCIUM 600+D) 600-800 MG-UNIT TABS Take 1 tablet by mouth daily.  Marland Kitchen diltiazem (CARDIZEM CD) 120 MG 24 hr capsule Take 1 capsule (120 mg total) by mouth daily.  . furosemide (LASIX) 20 MG tablet Take 1 tablet (20 mg total) by mouth daily as needed.  . metFORMIN (GLUCOPHAGE) 500 MG tablet TAKE 1 TABLET (500 MG TOTAL) BY MOUTH DAILY WITH BREAKFAST.  . metoprolol tartrate (LOPRESSOR) 100 MG tablet TAKE 1 TABLET BY MOUTH TWICE A DAY  . Multiple Vitamins-Minerals ([employer] SPECTRAVITE PO) Take  1 tablet by mouth daily.   . ONE TOUCH ULTRA TEST test strip USE ONCE DAILY DX E11.9  . ONETOUCH DELICA LANCETS 16X MISC Twice daily testing  . predniSONE (DELTASONE) 20 MG tablet Take 1 tablet (20 mg total) by mouth daily with breakfast.  . warfarin (COUMADIN) 5 MG tablet Take 1 tablet daily except 1/2 tablet on Sunday and Thursday or as directed By [facility]  . [DISCONTINUED] benazepril (LOTENSIN) 20 MG tablet Take 1 tablet (20 mg total) by mouth daily.  . [DISCONTINUED] diltiazem (CARDIZEM CD) 120 MG 24 hr capsule Take 1 capsule (120 mg total) by mouth daily.  . blood glucose meter kit and  supplies One touch ultra mini meter and supplies for once daily testing dx e11.9   No facility-administered encounter medications on file as of 01/12/2018.     Activities of Daily Living In your present state of health, do you have any difficulty performing the following activities: 01/12/2018 07/27/2017  Hearing? N N  Vision? N N  Difficulty concentrating or making decisions? N N  Walking or climbing stairs? N N  Dressing or bathing? N N  Doing errands, shopping? N N  Preparing Food and eating ? N -  Using the Toilet? N -  In the past six months, have you accidently leaked urine? N -  Do you have problems with loss of bowel control? N -  Managing your Medications? N -  Managing your Finances? N -  Housekeeping or managing your Housekeeping? N -  Some recent data might be hidden    Patient Care Team: Fayrene Helper, MD as PCP - [facility], Alphonse Guild, MD as Consulting Physician (Cardiology) Danie Binder, MD as Consulting Physician (Gastroenterology)    Assessment:   This is a routine wellness examination for Taleshia.  Exercise Activities and Dietary recommendations Current Exercise Habits: Home exercise routine, Time (Minutes): 20, Frequency (Times/Week): 3, Weekly Exercise (Minutes/Week): 60, Intensity: Mild  Goals    . Exercise 3x per week (30 min per time)      Recommend starting a routine exercise program at least 3 days a week for 30-45 minutes at a time as tolerated.      . Increase physical activity     Start going to the [facility]        Fall Risk Fall Risk  01/12/2018 07/27/2017 10/09/2016 06/11/2016 09/27/2015  Falls in the past year? _0    Is the patient's home free of loose throw rugs in walkways, pet beds, electrical cords, etc?   yes      Grab bars in the bathroom? no      Handrails on the stairs?   yes      Adequate lighting?   yes  Timed Get Up and Go performed:   Depression Screen PHQ 2/9 Scores 01/12/2018 07/27/2017 10/09/2016 02/12/2016  PHQ - 2 Score 0 0 0 0  PHQ- 9 Score - - - 0     Cognitive Function     6CIT Screen 01/12/2018 10/09/2016  What Year? 0 points 0 points  What month? 0 points 0 points  What time? 0 points 0 points  Count back from 20 2 points 0 points  Months in reverse 4 points 0 points  Repeat phrase 0 points 0 points  Total Score 6 0    Immunization History  Administered Date(s) Administered  . Influenza Split 04/22/2011, 04/16/2012  . Influenza Whole 03/23/2007, 05/18/2009  . Influenza,inj,Quad PF,6+ Mos 04/25/2013, 05/30/2014, 06/07/2015, 02/12/2016, 04/13/2017  . Pneumococcal Conjugate-13 09/06/2014  . Pneumococcal Polysaccharide-23 10/31/2009, 02/12/2016  . Td 06/17/2003  . Tdap 04/22/2011  . Zoster 05/18/2009    Qualifies for Shingles Vaccine?  Screening Tests Health Maintenance  Topic Date Due  . OPHTHALMOLOGY EXAM  12/09/2017  . FOOT EXAM  12/10/2017  . HEMOGLOBIN A1C  12/13/2017  . INFLUENZA VACCINE  01/14/2018  . MAMMOGRAM  11/06/2019  . COLONOSCOPY  10/08/2020  . TETANUS/TDAP  04/21/2021  . DEXA SCAN  Completed  . Hepatitis C Screening  Completed  . PNA vac Low Risk Adult  Completed    Cancer Screenings: Lung: Low Dose CT Chest recommended if  Age 36-80 years, 60 pack-year currently smoking OR have quit w/in 15years. Patient does not qualify. Breast:  Up to date on  Mammogram? Yes   Up to date of Bone Density/Dexa? Yes referral entered Colorectal: done  Additional Screenings: Hepatitis C Screening:      Plan:      I have personally reviewed and noted the following in the patient's chart:   . Medical and social history . Use of alcohol, tobacco or illicit drugs  . Current medications and supplements . Functional ability and status . Nutritional status . Physical activity . Advanced directives . List of other physicians . Hospitalizations, surgeries, and ER visits in previous 12 months . Vitals . Screenings to include cognitive, depression, and falls . Referrals and appointments  In addition, I have reviewed and discussed with patient certain preventive protocols, quality metrics, and best practice recommendations. A written personalized care plan for preventive services as well as general preventive health recommendations were provided to patient.     Kate Sable, LPN, LPN  0/80/2233

## 2018-01-20 ENCOUNTER — Ambulatory Visit (HOSPITAL_COMMUNITY)
Admission: RE | Admit: 2018-01-20 | Discharge: 2018-01-20 | Disposition: A | Discharge status: Home / Self Care | Payer: Medicare Other | Source: Ambulatory Visit | Attending: Family Medicine | Admitting: Family Medicine

## 2018-01-20 DIAGNOSIS — Z13820 Encounter for screening for osteoporosis: Secondary | ICD-10-CM | POA: Insufficient documentation

## 2018-01-20 DIAGNOSIS — Z780 Asymptomatic menopausal state: Secondary | ICD-10-CM | POA: Diagnosis not present

## 2018-02-02 ENCOUNTER — Other Ambulatory Visit: Payer: Self-pay | Admitting: Family Medicine

## 2018-02-08 ENCOUNTER — Ambulatory Visit (INDEPENDENT_AMBULATORY_CARE_PROVIDER_SITE_OTHER): Payer: Medicare Other | Admitting: *Deleted

## 2018-02-08 DIAGNOSIS — Z5181 Encounter for therapeutic drug level monitoring: Secondary | ICD-10-CM | POA: Diagnosis not present

## 2018-02-08 DIAGNOSIS — I480 Paroxysmal atrial fibrillation: Secondary | ICD-10-CM | POA: Diagnosis not present

## 2018-02-08 LAB — POCT INR: INR: 3.2 — AB (ref 2.0–3.0)

## 2018-02-08 NOTE — Patient Instructions (Signed)
Hold coumadin tonight then resume 1 tablet daily except 1/2 tablet on Sundays and Thursdays Recheck in 3.5 wks

## 2018-02-11 ENCOUNTER — Ambulatory Visit (INDEPENDENT_AMBULATORY_CARE_PROVIDER_SITE_OTHER): Payer: Medicare Other | Admitting: Cardiology

## 2018-02-11 ENCOUNTER — Encounter: Payer: Self-pay | Admitting: Cardiology

## 2018-02-11 VITALS — BP 112/80 | HR 85 | Ht 65.0 in | Wt 210.0 lb

## 2018-02-11 DIAGNOSIS — I10 Essential (primary) hypertension: Secondary | ICD-10-CM

## 2018-02-11 DIAGNOSIS — R600 Localized edema: Secondary | ICD-10-CM

## 2018-02-11 DIAGNOSIS — R0789 Other chest pain: Secondary | ICD-10-CM

## 2018-02-11 DIAGNOSIS — I480 Paroxysmal atrial fibrillation: Secondary | ICD-10-CM | POA: Diagnosis not present

## 2018-02-11 DIAGNOSIS — E782 Mixed hyperlipidemia: Secondary | ICD-10-CM | POA: Diagnosis not present

## 2018-02-11 NOTE — Patient Instructions (Signed)

## 2018-02-11 NOTE — Progress Notes (Signed)
Clinical Summary Samantha Vega is a 67 y.o.female seen today for follow up of the following medical problems.   1. Chest pain - history of atypical chest pain - she denies any recent symptoms.    2. Paroxysmal afib - rate control strategy w/ diltiazem and metoprolol  - previously on xarelto however cost was too much, changed to coumadin.   - no palpitations - compliant with meds. No bleeding on coumadin.  3. HTN  - she is compliant with meds  4. HL  - 05/2017 TC 157 TG 11 HDL 33 LDL 103 - compliant with statin  5. LE edema - controlled with prn lasix, she states she takes 7m daily.    Past Medical History:  Diagnosis Date  . Arthritis   . Chest pain    Noncardiac; evaluated and treated in 2008  . Diabetes mellitus   . Enlarged heart   . Hyperlipidemia   . Hypertension   . Obesity   . [place]   . Right bundle Nahshon Reich block    +Palpitations.   EKG 11/2006:  NSR, RBBB, LAFB, markedly delayed R-wave progression, no change; Echocardiogram in 10/2006-suboptimal quality, normal; Event recorder-PVCs, no significant arrhythmias or symptoms ;     No Known Allergies   Current Outpatient Medications  Medication Sig Dispense Refill  . atorvastatin (LIPITOR) 80 MG tablet Take 1 tablet (80 mg total) by mouth daily. 90 tablet 3  . benazepril (LOTENSIN) 20 MG tablet Take 1 tablet (20 mg total) by mouth daily. 90 tablet 1  . benzonatate (TESSALON) 200 MG capsule Take 1 capsule (200 mg total) by mouth 2 (two) times daily as needed for cough. 20 capsule 0  . blood glucose meter kit and supplies One touch ultra mini meter and supplies for once daily testing dx e11.9 1 each 5  . Calcium Carb-Cholecalciferol (CALCIUM 600+D) 600-800 MG-UNIT TABS Take 1 tablet by mouth daily.    .Marland Kitchendiltiazem (CARDIZEM CD) 120 MG 24 hr capsule Take 1 capsule (120 mg total) by mouth daily. 90 capsule 1  . furosemide (LASIX) 20 MG tablet Take 1 tablet (20 mg  total) by mouth daily as needed. 90 tablet 3  . metFORMIN (GLUCOPHAGE) 500 MG tablet TAKE 1 TABLET BY MOUTH EVERY DAY WITH BREAKFAST 90 tablet 2  . metoprolol tartrate (LOPRESSOR) 100 MG tablet TAKE 1 TABLET BY MOUTH TWICE A DAY 60 tablet 5  . Multiple Vitamins-Minerals ([employer] SPECTRAVITE PO) Take 1 tablet by mouth daily.     . ONE TOUCH ULTRA TEST test strip USE ONCE DAILY DX E11.9 100 each 1  . ONETOUCH DELICA LANCETS 391QMISC Twice daily testing 100 each 3  . predniSONE (DELTASONE) 20 MG tablet Take 1 tablet (20 mg total) by mouth daily with breakfast. 5 tablet 0  . warfarin (COUMADIN) 5 MG tablet Take 1 tablet daily except 1/2 tablet on Sunday and Thursday or as directed By [facility] 100 tablet 1   No current facility-administered medications for this visit.      Past Surgical History:  Procedure Laterality Date  . ABDOMINAL HYSTERECTOMY    . BREAST BIOPSY Left    benign  . CATARACT EXTRACTION W/PHACO Right 01/08/2016   Procedure: CATARACT EXTRACTION PHACO AND INTRAOCULAR LENS PLACEMENT (IOC);  Surgeon: MRutherford Guys MD;  Location: [facility];  Service: Ophthalmology;  Laterality: Right;  CDE: 5.78  . CATARACT EXTRACTION W/PHACO Left 02/05/2016   Procedure: CATARACT EXTRACTION PHACO AND INTRAOCULAR LENS PLACEMENT  LEFT EYE CDE=6.50;  Surgeon: Rutherford Guys, MD;  Location: [facility];  Service: Ophthalmology;  Laterality: Left;  . COLONOSCOPY  2006   Dr. Tamala Julian: Normal, repeat in 2009.  Marland Kitchen COLONOSCOPY N/A 10/08/2015   Procedure: COLONOSCOPY;  Surgeon: Danie Binder, MD;  [facility];  Service: Endoscopy;  Laterality: N/A;  230 - moved to 1:30, spoke with pt   . POLYPECTOMY  10/08/2015   Procedure: POLYPECTOMY;  Surgeon: Danie Binder, MD;  [facility];  Service: Endoscopy;;  cecal polyp removed via cold forceps with one clip placed/ rectal polyp removed via cold forcep  . SHOULDER SURGERY  2006   Left-post-trauma  . TOTAL ABDOMINAL HYSTERECTOMY W/ BILATERAL  SALPINGOOPHORECTOMY       No Known Allergies    Family History  Problem Relation Age of Onset  . Hypertension Mother   . Arthritis Mother   . Stroke Father   . Hypertension Father   . Coronary artery disease Father   . Diabetes Sister   . Sarcoidosis Sister   . Diabetes Sister   . Heart failure Brother   . Congestive Heart Failure Brother   . Colon cancer Neg Hx      Social History Samantha Vega reports that she has never smoked. She has never used smokeless tobacco. Samantha Vega reports that she does not drink alcohol.   Review of Systems CONSTITUTIONAL: No weight loss, fever, chills, weakness or fatigue.  HEENT: Eyes: No visual loss, blurred vision, double vision or yellow sclerae.No hearing loss, sneezing, congestion, runny nose or sore throat.  SKIN: No rash or itching.  CARDIOVASCULAR: per hpi RESPIRATORY: No shortness of breath, cough or sputum.  GASTROINTESTINAL: No anorexia, nausea, vomiting or diarrhea. No abdominal pain or blood.  GENITOURINARY: No burning on urination, no polyuria NEUROLOGICAL: No headache, dizziness, syncope, paralysis, ataxia, numbness or tingling in the extremities. No change in bowel or bladder control.  MUSCULOSKELETAL: No muscle, back pain, joint pain or stiffness.  LYMPHATICS: No enlarged nodes. No history of splenectomy.  PSYCHIATRIC: No history of depression or anxiety.  ENDOCRINOLOGIC: No reports of sweating, cold or heat intolerance. No polyuria or polydipsia.  Marland Kitchen   Physical Examination Vitals:   02/11/18 1031  BP: 112/80  Pulse: 85  SpO2: 95%   Vitals:   02/11/18 1031  Weight: 210 lb (95.3 kg)  Height: _0  (1.651 m)    Gen: resting comfortably, no acute distress HEENT: no scleral icterus, pupils equal round and reactive, no palptable cervical adenopathy,  CV: irreg, no m/r/g, no jvd Resp: Clear to auscultation bilaterally GI: abdomen is soft, non-tender, non-distended, normal bowel sounds, no hepatosplenomegaly MSK:  extremities are warm, no edema.  Skin: warm, no rash Neuro:  no focal deficits Psych: appropriate affect   Diagnostic Studies 07/12/12 Echo: LVEF 55-60%, mild basal septal hypertrophy,   07/11/12 Carotid US: <50% bilateral stenosis, 3.4 cm thyroid nodule     Assessment and Plan  1. Chest pain - no recent issues, continue to monitor. Prior symptoms were atypical   2. Parox afib  . CHADS2Vasc score is 3, continue anticoag. DOACs too expensive, continue coumadin.  - no symptoms, continue current meds  3. HTN:  -bp at goal, continue current meds  4. HL:  - continue high dose statin.   5. Leg edema - controlled with low dose lasix, continue. If progresses consider repeat echo  F/u 1 year.       Arnoldo Lenis, M.D

## 2018-02-14 ENCOUNTER — Emergency Department (HOSPITAL_COMMUNITY)
Admission: EM | Admit: 2018-02-14 | Discharge: 2018-02-14 | Disposition: A | Discharge status: Home / Self Care | Payer: Medicare Other | Attending: Emergency Medicine | Admitting: Emergency Medicine

## 2018-02-14 ENCOUNTER — Emergency Department (HOSPITAL_COMMUNITY): Payer: Medicare Other

## 2018-02-14 ENCOUNTER — Other Ambulatory Visit: Payer: Self-pay

## 2018-02-14 ENCOUNTER — Encounter (HOSPITAL_COMMUNITY): Payer: Self-pay | Admitting: Emergency Medicine

## 2018-02-14 DIAGNOSIS — Z7984 Long term (current) use of oral hypoglycemic drugs: Secondary | ICD-10-CM | POA: Insufficient documentation

## 2018-02-14 DIAGNOSIS — E119 Type 2 diabetes mellitus without complications: Secondary | ICD-10-CM | POA: Insufficient documentation

## 2018-02-14 DIAGNOSIS — R079 Chest pain, unspecified: Secondary | ICD-10-CM | POA: Diagnosis not present

## 2018-02-14 DIAGNOSIS — I10 Essential (primary) hypertension: Secondary | ICD-10-CM | POA: Insufficient documentation

## 2018-02-14 DIAGNOSIS — Z79899 Other long term (current) drug therapy: Secondary | ICD-10-CM | POA: Diagnosis not present

## 2018-02-14 DIAGNOSIS — R0602 Shortness of breath: Secondary | ICD-10-CM | POA: Diagnosis not present

## 2018-02-14 DIAGNOSIS — R0789 Other chest pain: Secondary | ICD-10-CM | POA: Insufficient documentation

## 2018-02-14 DIAGNOSIS — Z7901 Long term (current) use of anticoagulants: Secondary | ICD-10-CM | POA: Diagnosis not present

## 2018-02-14 LAB — DIFFERENTIAL
BASOS ABS: 0 10*3/uL (ref 0.0–0.1)
BASOS PCT: 0 %
EOS ABS: 0.2 10*3/uL (ref 0.0–0.7)
Eosinophils Relative: 4 %
Lymphocytes Relative: 37 %
Lymphs Abs: 1.9 10*3/uL (ref 0.7–4.0)
MONO ABS: 0.5 10*3/uL (ref 0.1–1.0)
MONOS PCT: 9 %
Neutro Abs: 2.5 10*3/uL (ref 1.7–7.7)
Neutrophils Relative %: 50 %

## 2018-02-14 LAB — HEPATIC FUNCTION PANEL
ALK PHOS: 117 U/L (ref 38–126)
ALT: 37 U/L (ref 0–44)
AST: 31 U/L (ref 15–41)
Albumin: 3.9 g/dL (ref 3.5–5.0)
Total Bilirubin: 0.7 mg/dL (ref 0.3–1.2)
Total Protein: 8 g/dL (ref 6.5–8.1)

## 2018-02-14 LAB — CBC
HCT: 40.5 % (ref 36.0–46.0)
Hemoglobin: 13.4 g/dL (ref 12.0–15.0)
MCH: 28.4 pg (ref 26.0–34.0)
MCHC: 33.1 g/dL (ref 30.0–36.0)
MCV: 85.8 fL (ref 78.0–100.0)
PLATELETS: 211 10*3/uL (ref 150–400)
RBC: 4.72 MIL/uL (ref 3.87–5.11)
RDW: 15.6 % — ABNORMAL HIGH (ref 11.5–15.5)
WBC: 5 10*3/uL (ref 4.0–10.5)

## 2018-02-14 LAB — PROTIME-INR
INR: 2.21
Prothrombin Time: 24.4 seconds — ABNORMAL HIGH (ref 11.4–15.2)

## 2018-02-14 LAB — TROPONIN I

## 2018-02-14 LAB — BASIC METABOLIC PANEL
ANION GAP: 8 (ref 5–15)
BUN: 16 mg/dL (ref 8–23)
CALCIUM: 9.6 mg/dL (ref 8.9–10.3)
CHLORIDE: 107 mmol/L (ref 98–111)
CO2: 24 mmol/L (ref 22–32)
CREATININE: 0.71 mg/dL (ref 0.44–1.00)
GFR calc Af Amer: >60 mL/min (ref >60)
GFR calc non Af Amer: >60 mL/min (ref >60)
Glucose, Bld: 103 mg/dL — ABNORMAL HIGH (ref 70–99)
Potassium: 4.3 mmol/L (ref 3.5–5.1)
SODIUM: 139 mmol/L (ref 135–145)

## 2018-02-14 LAB — D-DIMER, QUANTITATIVE (NOT AT ARMC): D DIMER QUANT: 0.3 ug{FEU}/mL (ref 0.00–0.50)

## 2018-02-14 MED ORDER — TRAMADOL HCL 50 MG PO TABS: 50.0000 mg | ORAL_TABLET | Freq: Four times a day (QID) | ORAL | 0 refills | Status: DC | PRN

## 2018-02-14 MED ORDER — HYDROCODONE-ACETAMINOPHEN 5-325 MG PO TABS
1.0000 | ORAL_TABLET | Freq: Once | ORAL | Status: AC
  Administered 2018-02-14: 1 via ORAL
  Filled 2018-02-14: qty 1

## 2018-02-14 NOTE — ED Triage Notes (Signed)
Pt reports having chest pain this morning.  Pt keeps answering "I don't know" to most questions asked.  States she is just tired.  Also reported some sob.

## 2018-02-14 NOTE — ED Notes (Signed)
Pt declined to sign as she had to go to [facility]

## 2018-02-14 NOTE — ED Provider Notes (Signed)
[facility] EMERGENCY DEPARTMENT Provider Note   CSN: [id number] Arrival date & time: 02/14/18  1047     History   Chief Complaint Chief Complaint  Patient presents with  . Chest Pain    HPI Samantha Vega is a 67 y.o. female.  Patient complains of left-sided chest pain.  No shortness of breath no sweating  The history is provided by the patient. No language interpreter was used.  Chest Pain   This is a new problem. The current episode started 12 to 24 hours ago. The problem occurs rarely. The problem has been resolved. The pain is associated with movement. The pain is present in the lateral region. The pain is at a severity of 3/10. The pain is mild. The quality of the pain is described as brief. The pain does not radiate. Pertinent negatives include no abdominal pain, no back pain, no cough and no headaches.  Pertinent negatives for past medical history include no seizures.    Past Medical History:  Diagnosis Date  . Arthritis   . Chest pain    Noncardiac; evaluated and treated in 2008  . Diabetes mellitus   . Enlarged heart   . Hyperlipidemia   . Hypertension   . Obesity   . PAF (paroxysmal atrial fibrillation) ([place])   . Right bundle branch block    +Palpitations.   EKG 11/2006:  NSR, RBBB, LAFB, markedly delayed R-wave progression, no change; Echocardiogram in 10/2006-suboptimal quality, normal; Event recorder-PVCs, no significant arrhythmias or symptoms ;    Patient Active Problem List   Diagnosis Date Noted  . Tubular adenoma of colon 10/09/2015  . Annual physical exam 06/07/2015  . Baker's cyst of knee 10/04/2013  . Metabolic syndrome X 97/41/6384  . Encounter for therapeutic drug monitoring 08/25/2013  . Chronic anticoagulation 09/16/2012  . Paroxysmal atrial fibrillation ([place]) 05/22/2012  . OA (osteoarthritis) of knee 05/05/2012  . RBBB (right bundle branch block)   . Obesity   . Diabetes mellitus type 2 in obese ([place])   . Unspecified visual loss  12/08/2008  . Essential hypertension 12/08/2008  . Hyperlipidemia LDL goal <100 12/06/2007    Past Surgical History:  Procedure Laterality Date  . ABDOMINAL HYSTERECTOMY    . BREAST BIOPSY Left    benign  . CATARACT EXTRACTION W/PHACO Right 01/08/2016   Procedure: CATARACT EXTRACTION PHACO AND INTRAOCULAR LENS PLACEMENT (IOC);  Surgeon: Rutherford Guys, MD;  Location: [facility] ORS;  Service: Ophthalmology;  Laterality: Right;  CDE: 5.78  . CATARACT EXTRACTION W/PHACO Left 02/05/2016   Procedure: CATARACT EXTRACTION PHACO AND INTRAOCULAR LENS PLACEMENT LEFT EYE CDE=6.50;  Surgeon: Rutherford Guys, MD;  Location: [facility] ORS;  Service: Ophthalmology;  Laterality: Left;  . COLONOSCOPY  2006   Dr. Tamala Julian: Normal, repeat in 2009.  Marland Kitchen COLONOSCOPY N/A 10/08/2015   Procedure: COLONOSCOPY;  Surgeon: Danie Binder, MD;  Location: [facility] ENDO SUITE;  Service: Endoscopy;  Laterality: N/A;  230 - moved to 1:30, spoke with pt   . POLYPECTOMY  10/08/2015   Procedure: POLYPECTOMY;  Surgeon: Danie Binder, MD;  Location: [facility] END[facility] SUITE;  Service: Endoscopy;;  cecal polyp removed via cold forceps with one clip placed/ rectal polyp removed via cold forcep  . SHOULDER SURGERY  2006   Left-post-trauma  . TOTAL ABDOMINAL HYSTERECTOMY W/ BILATERAL SALPINGOOPHORECTOMY       OB History   None      Home Medications    Prior to Admission medications   Medication Sig Start Date End  Date Taking? Authorizing Provider  atorvastatin (LIPITOR) 80 MG tablet Take 1 tablet (80 mg total) by mouth daily. 03/20/17 01/13/19 Yes Branch, Alphonse Guild, MD  benazepril (LOTENSIN) 20 MG tablet Take 1 tablet (20 mg total) by mouth daily. 01/12/18  Yes Fayrene Helper, MD  blood glucose meter kit and supplies One touch ultra mini meter and supplies for once daily testing dx e11.9 01/12/18  Yes Fayrene Helper, MD  Calcium Carb-Cholecalciferol (CALCIUM 600+D) 600-800 MG-UNIT TABS Take 1 tablet by mouth daily.   Yes [provider]    diltiazem (CARDIZEM CD) 120 MG 24 hr capsule Take 1 capsule (120 mg total) by mouth daily. 01/12/18  Yes Fayrene Helper, MD  furosemide (LASIX) 20 MG tablet Take 1 tablet (20 mg total) by mouth daily as needed. 03/20/17  Yes Arnoldo Lenis, MD  metFORMIN (GLUCOPHAGE) 500 MG tablet TAKE 1 TABLET BY MOUTH EVERY DAY WITH BREAKFAST 02/02/18  Yes Fayrene Helper, MD  metoprolol tartrate (LOPRESSOR) 100 MG tablet TAKE 1 TABLET BY MOUTH TWICE A DAY 09/29/17  Yes Branch, Alphonse Guild, MD  Multiple Vitamins-Minerals ([facility] SPECTRAVITE PO) Take 1 tablet by mouth daily.    Yes [provider]  ONE TOUCH ULTRA TEST test strip USE ONCE DAILY DX E11.9 12/07/17  Yes Fayrene Helper, MD  [facility] DELICA LANCETS 35K MISC Twice daily testing 04/25/16  Yes Fayrene Helper, MD  predniSONE (DELTASONE) 20 MG tablet Take 1 tablet (20 mg total) by mouth daily with breakfast. 07/27/17  Yes Raylene Everts, MD  warfarin (COUMADIN) 5 MG tablet Take 1 tablet daily except 1/2 tablet on Sunday and Thursday or as directed By [facility] Clinic 12/28/17  Yes Branch, Alphonse Guild, MD  traMADol (ULTRAM) 50 MG tablet Take 1 tablet (50 mg total) by mouth every 6 (six) hours as needed. 02/14/18   Milton Ferguson, MD    Family History Family History  Problem Relation Age of Onset  . Hypertension Mother   . Arthritis Mother   . Stroke Father   . Hypertension Father   . Coronary artery disease Father   . Diabetes Sister   . Sarcoidosis Sister   . Diabetes Sister   . Heart failure Brother   . Congestive Heart Failure Brother   . Colon cancer Neg Hx     Social History Social History   Tobacco Use  . Smoking status: Never Smoker  . Smokeless tobacco: Never Used  Substance Use Topics  . Alcohol use: No  . Drug use: No     Allergies   Patient has no known allergies.   Review of Systems Review of Systems  Constitutional: Negative for appetite change and fatigue.  HENT: Negative for congestion, ear  discharge and sinus pressure.   Eyes: Negative for discharge.  Respiratory: Negative for cough.   Cardiovascular: Positive for chest pain.  Gastrointestinal: Negative for abdominal pain and diarrhea.  Genitourinary: Negative for frequency and hematuria.  Musculoskeletal: Negative for back pain.  Skin: Negative for rash.  Neurological: Negative for seizures and headaches.  Psychiatric/Behavioral: Negative for hallucinations.     Physical Exam Updated Vital Signs BP 107/81   Pulse (!) 118   Temp 97.7 F (36.5 C) (Oral)   Resp 13   Ht 5' 5" (1.651 m)   Wt 95.3 kg   SpO2 97%   BMI 34.95 kg/m   Physical Exam  Constitutional: She is oriented to person, place, and time. She appears well-developed.  HENT:  Head: Normocephalic.  Eyes: Conjunctivae and EOM are normal. No scleral icterus.  Neck: Neck supple. No thyromegaly present.  Cardiovascular: Normal rate and regular rhythm. Exam reveals no gallop and no friction rub.  No murmur heard. Pulmonary/Chest: No stridor. She has no wheezes. She has no rales. She exhibits no tenderness.  Tender left chest.  Abdominal: She exhibits no distension. There is no tenderness. There is no rebound.  Musculoskeletal: Normal range of motion. She exhibits no edema.  Lymphadenopathy:    She has no cervical adenopathy.  Neurological: She is oriented to person, place, and time. She exhibits normal muscle tone. Coordination normal.  Skin: No rash noted. No erythema.  Psychiatric: She has a normal mood and affect. Her behavior is normal.     ED Treatments / Results  Labs (all labs ordered are listed, but only abnormal results are displayed) Labs Reviewed  BASIC METABOLIC PANEL - Abnormal; Notable for the following components:      Result Value   Glucose, Bld 103 (*)    All other components within normal limits  CBC - Abnormal; Notable for the following components:   RDW 15.6 (*)    All other components within normal limits  PROTIME-INR -  Abnormal; Notable for the following components:   Prothrombin Time 24.4 (*)    All other components within normal limits  TROPONIN I  HEPATIC FUNCTION PANEL  D-DIMER, QUANTITATIVE (NOT AT [facility])  DIFFERENTIAL  TROPONIN I    EKG EKG Interpretation  Date/Time:  _0 /01/19 1500           Milton Ferguson, MD 02/14/18 1502

## 2018-02-14 NOTE — Discharge Instructions (Addendum)
Follow-up with your family doctor next week for recheck. 

## 2018-02-14 NOTE — ED Notes (Signed)
Pt reports she was in Sunday school and had CP Poor historian when describing pain, SOB Report tired

## 2018-02-25 ENCOUNTER — Other Ambulatory Visit: Payer: Self-pay

## 2018-02-25 ENCOUNTER — Ambulatory Visit (INDEPENDENT_AMBULATORY_CARE_PROVIDER_SITE_OTHER): Payer: Medicare Other | Admitting: Family Medicine

## 2018-02-25 ENCOUNTER — Encounter: Payer: Self-pay | Admitting: Family Medicine

## 2018-02-25 VITALS — BP 108/64 | HR 95 | Resp 16 | Ht 65.0 in | Wt 206.0 lb

## 2018-02-25 DIAGNOSIS — E6609 Other obesity due to excess calories: Secondary | ICD-10-CM | POA: Diagnosis not present

## 2018-02-25 DIAGNOSIS — Z23 Encounter for immunization: Secondary | ICD-10-CM

## 2018-02-25 DIAGNOSIS — I10 Essential (primary) hypertension: Secondary | ICD-10-CM | POA: Diagnosis not present

## 2018-02-25 DIAGNOSIS — E785 Hyperlipidemia, unspecified: Secondary | ICD-10-CM

## 2018-02-25 DIAGNOSIS — Z6834 Body mass index (BMI) 34.0-34.9, adult: Secondary | ICD-10-CM

## 2018-02-25 DIAGNOSIS — E119 Type 2 diabetes mellitus without complications: Secondary | ICD-10-CM

## 2018-02-25 DIAGNOSIS — E559 Vitamin D deficiency, unspecified: Secondary | ICD-10-CM | POA: Diagnosis not present

## 2018-02-25 DIAGNOSIS — E8881 Metabolic syndrome: Secondary | ICD-10-CM

## 2018-02-25 NOTE — Patient Instructions (Addendum)
Physical exam with MD early January, call if you need me before  HAPPY BELATED Birthday,   Lab today HBA1C, lipi panel and , vit d  Flu vaccine today  CONGRATS , no more metformin , you are NOT diabetic, but PLEASE limit sugar and starchy foods , they are NOT healthy for ANY and ALL of [place], and especially not people who may become diabetic  Correction after lab resulted, patient is indeed diabetic and can be managed with diet only at this time

## 2018-02-25 NOTE — Patient Outreach (Signed)
[place] [facility]) Care Management  02/25/2018  Samantha Vega [birth year] [id number]   Medication Adherence call to Mrs. Samantha Vega spoke with patient she said she still has medication from april/2019 she is past due for 51 days patient will be refer to [facility] the pharmacist to better assist her with all her medication, patient is due on Atorvastatin 80 mg. Mrs. Samantha Vega is showing past due under [place].  [facility] Direct Dial [phone]  Fax [phone] [email or web address]

## 2018-02-26 LAB — HEMOGLOBIN A1C
HEMOGLOBIN A1C: 6.5 %{Hb} — AB (ref <5.7)
Mean Plasma Glucose: 140 (calc)
eAG (mmol/L): 7.7 (calc)

## 2018-02-26 LAB — LIPID PANEL
CHOL/HDL RATIO: 4 (calc) (ref <5.0)
Cholesterol: 133 mg/dL (ref <200)
HDL: 33 mg/dL — AB (ref >50)
LDL Cholesterol (Calc): 85 mg/dL (calc)
NON-HDL CHOLESTEROL (CALC): 100 mg/dL (ref <130)
Triglycerides: 64 mg/dL (ref <150)

## 2018-02-26 LAB — VITAMIN D 25 HYDROXY (VIT D DEFICIENCY, FRACTURES): Vit D, 25-Hydroxy: 34 ng/mL (ref 30–100)

## 2018-02-28 ENCOUNTER — Encounter: Payer: Self-pay | Admitting: Family Medicine

## 2018-02-28 DIAGNOSIS — E119 Type 2 diabetes mellitus without complications: Secondary | ICD-10-CM | POA: Insufficient documentation

## 2018-02-28 NOTE — Assessment & Plan Note (Signed)
Ms. Flatley is reminded of the importance of commitment to daily physical activity for 30 minutes or more, as able and the need to limit carbohydrate intake to 30 to 60 grams per meal to help with blood sugar control.   TMs. Symons is reminded of the importance of daily foot exam, annual eye examination, and good blood sugar, blood pressure and cholesterol control. Deteriorated but does not need metformin which was discontinued at this visit, will need repeat hBa1C in 4 months  Diabetic Labs Latest Ref Rng & Units 02/25/2018 02/14/2018 06/15/2017 12/10/2016 06/11/2016  HbA1c <5.7 % of total Hgb 6.5(H) - 6.2(H) 6.1(H) 5.9  Microalbumin Not Estab. ug/mL - - - - -  Micro/Creat Ratio 0.0 - 30.0 mg/g creat - - - - -  Chol <200 mg/dL 133 - 157 157 -  HDL >50 mg/dL 33(L) - 33(L) 30(L) -  Calc LDL mg/dL (calc) 85 - 103(H) 110(H) 94  Triglycerides <150 mg/dL 64 - 111 85 -  Creatinine 0.44 - 1.00 mg/dL - 0.71 0.79 0.68 -   BP/Weight 02/25/2018 02/14/2018 02/11/2018 01/12/2018 07/27/2017 06/15/2017 25/11/3891  Systolic BP 734 287 681 157 262 035 597  Diastolic BP 64 81 80 82 86 80 84  Wt. (Lbs) 206 210 210 208 203.04 200 202  BMI 34.28 34.95 34.95 36.85 35.97 35.43 33.61   Foot/eye exam completion dates Latest Ref Rng & Units 02/25/2018 12/10/2016  Eye Exam No Retinopathy - -  Foot Form Completion - Done Done

## 2018-02-28 NOTE — Assessment & Plan Note (Signed)
Improved. Patient re-educated about  the importance of commitment to a  minimum of 150 minutes of exercise per week.  The importance of healthy food choices with portion control discussed. Encouraged to start a food diary, count calories and to consider  joining a support group. Sample diet sheets offered. Goals set by the patient for the next several months.   Weight /BMI 02/25/2018 02/14/2018 02/11/2018  WEIGHT 206 lb 210 lb 210 lb  HEIGHT 5\' 5"  5\' 5"  5\' 5"   BMI 34.28 kg/m2 34.95 kg/m2 34.95 kg/m2

## 2018-02-28 NOTE — Assessment & Plan Note (Signed)
Controlled, no change in medication DASH diet and commitment to daily physical activity for a minimum of 30 minutes discussed and encouraged, as a part of hypertension management. The importance of attaining a healthy weight is also discussed.  BP/Weight 02/25/2018 02/14/2018 02/11/2018 01/12/2018 07/27/2017 06/15/2017 37/08/6679  Systolic BP 594 707 615 183 437 357 897  Diastolic BP 64 81 80 82 86 80 84  Wt. (Lbs) 206 210 210 208 203.04 200 202  BMI 34.28 34.95 34.95 36.85 35.97 35.43 33.61

## 2018-02-28 NOTE — Progress Notes (Signed)
Samantha Vega     MRN: [id number]      DOB: 1950-06-27   HPI Samantha Vega is here for follow up and re-evaluation of chronic medical conditions, medication management and review of any available recent lab and radiology data.  Preventive health is updated, specifically  Cancer screening and Immunization.   Questions or concerns regarding consultations or procedures which the PT has had in the interim are  addressed. The PT denies any adverse reactions to current medications since the last visit.  There are no new concerns.  There are no specific complaints  Denies polyuria, polydipsia, blurred vision , or hypoglycemic episodes.   ROS Denies recent fever or chills. Denies sinus pressure, nasal congestion, ear pain or sore throat. Denies chest congestion, productive cough or wheezing. Denies chest pains, palpitations and leg swelling Denies abdominal pain, nausea, vomiting,diarrhea or constipation.   Denies dysuria, frequency, hesitancy or incontinence. Denies joint pain, swelling and limitation in mobility. Denies headaches, seizures, numbness, or tingling. Denies depression, anxiety or insomnia. Denies skin break down or rash.   PE  BP 108/64   Pulse 95   Resp 16   Ht 5\' 5"  (1.651 m)   Wt 206 lb (93.4 kg)   SpO2 99%   BMI 34.28 kg/m   Patient alert and oriented and in no cardiopulmonary distress.  HEENT: No facial asymmetry, EOMI,   oropharynx pink and moist.  Neck supple no JVD, no mass.  Chest: Clear to auscultation bilaterally.  CVS: S1, S2 no murmurs, no S3.Regular rate.  ABD: Soft non tender.   Ext: No edema  MS: Adequate ROM spine, shoulders, hips and knees.  Skin: Intact, no ulcerations or rash noted.  Psych: Good eye contact, normal affect. Memory intact not anxious or depressed appearing.  CNS: CN 2-12 intact, power,  normal throughout.no focal deficits noted.   Assessment & Plan  Essential hypertension Controlled, no change in  medication DASH diet and commitment to daily physical activity for a minimum of 30 minutes discussed and encouraged, as a part of hypertension management. The importance of attaining a healthy weight is also discussed.  BP/Weight 02/25/2018 02/14/2018 02/11/2018 01/12/2018 07/27/2017 06/15/2017 44/08/1538  Systolic BP 086 761 950 932 671 245 809  Diastolic BP 64 81 80 82 86 80 84  Wt. (Lbs) 206 210 210 208 203.04 200 202  BMI 34.28 34.95 34.95 36.85 35.97 35.43 33.61       Hyperlipidemia LDL goal <100 Hyperlipidemia:Low fat diet discussed and encouraged.   Lipid Panel  Lab Results  Component Value Date   CHOL 133 02/25/2018   HDL 33 (L) 02/25/2018   LDLCALC 85 02/25/2018   TRIG 64 02/25/2018   CHOLHDL 4.0 02/25/2018   Needs to increase exercise to improve HDL    Obesity Improved. Patient re-educated about  the importance of commitment to a  minimum of 150 minutes of exercise per week.  The importance of healthy food choices with portion control discussed. Encouraged to start a food diary, count calories and to consider  joining a support group. Sample diet sheets offered. Goals set by the patient for the next several months.   Weight /BMI 02/25/2018 02/14/2018 02/11/2018  WEIGHT 206 lb 210 lb 210 lb  HEIGHT 5\' 5"  5\' 5"  5\' 5"   BMI 34.28 kg/m2 34.95 kg/m2 34.95 kg/m2      Diabetes mellitus type 2, diet-controlled ([place]) Samantha Vega is reminded of the importance of commitment to daily physical activity for 30 minutes or more,  as able and the need to limit carbohydrate intake to 30 to 60 grams per meal to help with blood sugar control.   Samantha Vega is reminded of the importance of daily foot exam, annual eye examination, and good blood sugar, blood pressure and cholesterol control. Deteriorated but does not need metformin which was discontinued at this visit, will need repeat hBa1C in 4 months  Diabetic Labs Latest Ref Rng & Units 02/25/2018 02/14/2018 06/15/2017 12/10/2016  06/11/2016  HbA1c <5.7 % of total Hgb 6.5(H) - 6.2(H) 6.1(H) 5.9  Microalbumin Not Estab. ug/mL - - - - -  Micro/Creat Ratio 0.0 - 30.0 mg/g creat - - - - -  Chol <200 mg/dL 133 - 157 157 -  HDL >50 mg/dL 33(L) - 33(L) 30(L) -  Calc LDL mg/dL (calc) 85 - 103(H) 110(H) 94  Triglycerides <150 mg/dL 64 - 111 85 -  Creatinine 0.44 - 1.00 mg/dL - 0.71 0.79 0.68 -   BP/Weight 02/25/2018 02/14/2018 02/11/2018 01/12/2018 07/27/2017 06/15/2017 15/10/2078  Systolic BP 223 361 224 497 530 051 102  Diastolic BP 64 81 80 82 86 80 84  Wt. (Lbs) 206 210 210 208 203.04 200 202  BMI 34.28 34.95 34.95 36.85 35.97 35.43 33.61   Foot/eye exam completion dates Latest Ref Rng & Units 02/25/2018 12/10/2016  Eye Exam No Retinopathy - -  Foot Form Completion - Done Done

## 2018-02-28 NOTE — Assessment & Plan Note (Signed)
Hyperlipidemia:Low fat diet discussed and encouraged.   Lipid Panel  Lab Results  Component Value Date   CHOL 133 02/25/2018   HDL 33 (L) 02/25/2018   LDLCALC 85 02/25/2018   TRIG 64 02/25/2018   CHOLHDL 4.0 02/25/2018   Needs to increase exercise to improve HDL

## 2018-03-02 ENCOUNTER — Other Ambulatory Visit: Payer: Self-pay | Admitting: Pharmacist

## 2018-03-02 NOTE — Patient Outreach (Signed)
[place] [facility]) Care Management  03/02/2018   Samantha Vega 13-Aug-1950 [id number]  Subjective:   67 year old female referred to [facility] for medication adherence review. PMHx includes, but not limited to, hypertension, atrial fibrillation, T2DM, hyperlipidemia, and OA.   Received referral from [facility] for med adherence review. Samantha Vega last filled atorvastatin 80 mg daily on 10/01/2017 for a 90 day supply.   Contacted patient. HIPAA verifiers identified. She confirmed that she had last filled atorvastatin in April, but states that she is taking it daily. She reports that she fills a weekly pill box to help with medication adherence. She notes that she had a supply of atorvastatin at home when she picked up this prescription, which is why she hasn't needed to refill it yet.   Objective:   Current Medications:  Current Outpatient Medications  Medication Sig Dispense Refill  . atorvastatin (LIPITOR) 80 MG tablet Take 1 tablet (80 mg total) by mouth daily. 90 tablet 3  . benazepril (LOTENSIN) 20 MG tablet Take 1 tablet (20 mg total) by mouth daily. 90 tablet 1  . Calcium Carb-Cholecalciferol (CALCIUM 600+D) 600-800 MG-UNIT TABS Take 1 tablet by mouth daily.    Marland Kitchen diltiazem (CARDIZEM CD) 120 MG 24 hr capsule Take 1 capsule (120 mg total) by mouth daily. 90 capsule 1  . furosemide (LASIX) 20 MG tablet Take 1 tablet (20 mg total) by mouth daily as needed. 90 tablet 3  . metoprolol tartrate (LOPRESSOR) 100 MG tablet TAKE 1 TABLET BY MOUTH TWICE A DAY 60 tablet 5  . warfarin (COUMADIN) 5 MG tablet Take 1 tablet daily except 1/2 tablet on Sunday and Thursday or as directed By [facility]madin Clinic 100 tablet 1  . Multiple Vitamins-Minerals ([facility] SPECTRAVITE PO) Take 1 tablet by mouth daily.      No current facility-administered medications for this visit.     Functional Status:  In your present state of health, do you have any difficulty performing the  following activities: 01/12/2018 07/27/2017  Hearing? N N  Vision? N N  Difficulty concentrating or making decisions? N N  Walking or climbing stairs? N N  Dressing or bathing? N N  Doing errands, shopping? N N  Preparing Food and eating ? N -  Using the Toilet? N -  In the past six months, have you accidently leaked urine? N -  Do you have problems with loss of bowel control? N -  Managing your Medications? N -  Managing your Finances? N -  Housekeeping or managing your Housekeeping? N -  Some recent data might be hidden    Fall/Depression Screening: Fall Risk  02/25/2018 01/12/2018 07/27/2017  Falls in the past year? No No No   PHQ 2/9 Scores 02/25/2018 01/12/2018 07/27/2017 10/09/2016 02/12/2016 09/06/2014 04/25/2013  PHQ - 2 Score 0 0 0 0 0 0 0  PHQ- 9 Score - - - - 0 - -    Assessment:   Drugs sorted by system:  Cardiovascular: atorvastatin, benazepril, diltiazem, furosemide, metoprolol, warfarin  Vitamins/Minerals: calcium + vitamin D  Medication Adherence: - Most recent LDL at goal <100, and had improved since last check in 05/2017. It appears she was changed from atorvastatin 40 mg to 80 mg at the end of last calendar year, which may explain why her fill history and supply do not match up.   Plan:  - Offered to patient to have [facility] fill at[facility]atin prescription for her, but she declined, stating that  she would fill it when she ran out of her current supply.  - Patient denies any questions or concerns with her medications.  - Will close case.   Catie Darnelle Maffucci, PharmD PGY2 Ambulatory Care Pharmacy Resident Phone: [phone]

## 2018-03-03 ENCOUNTER — Ambulatory Visit (INDEPENDENT_AMBULATORY_CARE_PROVIDER_SITE_OTHER): Payer: Medicare Other | Admitting: *Deleted

## 2018-03-03 DIAGNOSIS — I480 Paroxysmal atrial fibrillation: Secondary | ICD-10-CM | POA: Diagnosis not present

## 2018-03-03 DIAGNOSIS — Z5181 Encounter for therapeutic drug level monitoring: Secondary | ICD-10-CM

## 2018-03-03 LAB — POCT INR: INR: 2.7 (ref 2.0–3.0)

## 2018-03-03 NOTE — Patient Instructions (Signed)
Continue coumadin 1 tablet daily except 1/2 tablet on Sundays and Thursdays Recheck in 4 wks

## 2018-03-11 ENCOUNTER — Telehealth: Payer: Self-pay

## 2018-03-11 DIAGNOSIS — I10 Essential (primary) hypertension: Secondary | ICD-10-CM

## 2018-03-11 DIAGNOSIS — E119 Type 2 diabetes mellitus without complications: Secondary | ICD-10-CM

## 2018-03-11 NOTE — Telephone Encounter (Signed)
Advised patient of recent lab results with verbal understanding. Patient made aware that she is able to get diabetic shoes. She will think about it and let us know if she wants them. Labs ordered and mailed to patient with verbal understanding to have drawn prior to appt in January.

## 2018-03-31 ENCOUNTER — Ambulatory Visit (INDEPENDENT_AMBULATORY_CARE_PROVIDER_SITE_OTHER): Payer: Medicare Other | Admitting: *Deleted

## 2018-03-31 DIAGNOSIS — I480 Paroxysmal atrial fibrillation: Secondary | ICD-10-CM | POA: Diagnosis not present

## 2018-03-31 DIAGNOSIS — Z5181 Encounter for therapeutic drug level monitoring: Secondary | ICD-10-CM

## 2018-03-31 LAB — POCT INR: INR: 2.2 (ref 2.0–3.0)

## 2018-03-31 NOTE — Patient Instructions (Signed)
Continue coumadin 1 tablet daily except 1/2 tablet on Sundays and Thursdays Recheck in 5 wks

## 2018-04-03 ENCOUNTER — Other Ambulatory Visit: Payer: Self-pay | Admitting: Cardiology

## 2018-04-15 ENCOUNTER — Other Ambulatory Visit: Payer: Self-pay | Admitting: Cardiology

## 2018-05-01 ENCOUNTER — Other Ambulatory Visit: Payer: Self-pay | Admitting: Cardiology

## 2018-05-05 ENCOUNTER — Ambulatory Visit (INDEPENDENT_AMBULATORY_CARE_PROVIDER_SITE_OTHER): Payer: Medicare Other | Admitting: *Deleted

## 2018-05-05 DIAGNOSIS — Z5181 Encounter for therapeutic drug level monitoring: Secondary | ICD-10-CM | POA: Diagnosis not present

## 2018-05-05 DIAGNOSIS — I480 Paroxysmal atrial fibrillation: Secondary | ICD-10-CM

## 2018-05-05 LAB — POCT INR: INR: 2.7 (ref 2.0–3.0)

## 2018-05-05 NOTE — Patient Instructions (Signed)
Continue coumadin 1 tablet daily except 1/2 tablet on Sundays and Thursdays Recheck in 6 wks 

## 2018-06-21 ENCOUNTER — Other Ambulatory Visit: Payer: Self-pay | Admitting: Family Medicine

## 2018-06-21 ENCOUNTER — Ambulatory Visit (INDEPENDENT_AMBULATORY_CARE_PROVIDER_SITE_OTHER): Payer: Medicare Other | Admitting: Pharmacist

## 2018-06-21 DIAGNOSIS — Z5181 Encounter for therapeutic drug level monitoring: Secondary | ICD-10-CM | POA: Diagnosis not present

## 2018-06-21 DIAGNOSIS — I480 Paroxysmal atrial fibrillation: Secondary | ICD-10-CM

## 2018-06-21 DIAGNOSIS — I10 Essential (primary) hypertension: Secondary | ICD-10-CM | POA: Diagnosis not present

## 2018-06-21 DIAGNOSIS — E119 Type 2 diabetes mellitus without complications: Secondary | ICD-10-CM | POA: Diagnosis not present

## 2018-06-21 LAB — POCT INR: INR: 3.3 — AB (ref 2.0–3.0)

## 2018-06-21 NOTE — Patient Instructions (Signed)
Description   Skip your dose today then continue coumadin 1 tablet daily except 1/2 tablet on Sundays and Thursdays Recheck in 6 wks

## 2018-06-23 LAB — BASIC METABOLIC PANEL
BUN/Creatinine Ratio: 18 (ref 12–28)
BUN: 17 mg/dL (ref 8–27)
CALCIUM: 9.2 mg/dL (ref 8.7–10.3)
CHLORIDE: 102 mmol/L (ref 96–106)
CO2: 22 mmol/L (ref 20–29)
Creatinine, Ser: 0.96 mg/dL (ref 0.57–1.00)
GFR calc non Af Amer: 61 mL/min/{1.73_m2} (ref >59)
GFR, EST AFRICAN AMERICAN: 70 mL/min/{1.73_m2} (ref >59)
GLUCOSE: 101 mg/dL — AB (ref 65–99)
POTASSIUM: 4.3 mmol/L (ref 3.5–5.2)
Sodium: 139 mmol/L (ref 134–144)

## 2018-06-23 LAB — MICROALBUMIN, URINE: MICROALBUM., U, RANDOM: 16 ug/mL

## 2018-06-23 LAB — HGB A1C W/O EAG: HEMOGLOBIN A1C: 6.6 % — AB (ref 4.8–5.6)

## 2018-06-28 ENCOUNTER — Encounter: Payer: Self-pay | Admitting: Family Medicine

## 2018-06-28 ENCOUNTER — Ambulatory Visit (INDEPENDENT_AMBULATORY_CARE_PROVIDER_SITE_OTHER): Payer: Medicare Other | Admitting: Family Medicine

## 2018-06-28 VITALS — BP 120/60 | HR 95 | Resp 15 | Ht 65.0 in | Wt 208.0 lb

## 2018-06-28 DIAGNOSIS — I10 Essential (primary) hypertension: Secondary | ICD-10-CM

## 2018-06-28 DIAGNOSIS — Z0000 Encounter for general adult medical examination without abnormal findings: Secondary | ICD-10-CM

## 2018-06-28 DIAGNOSIS — R7302 Impaired glucose tolerance (oral): Secondary | ICD-10-CM

## 2018-06-28 DIAGNOSIS — E785 Hyperlipidemia, unspecified: Secondary | ICD-10-CM

## 2018-06-28 MED ORDER — DILTIAZEM HCL ER COATED BEADS 120 MG PO CP24: 120.0000 mg | ORAL_CAPSULE | Freq: Every day | ORAL | 1 refills | Status: DC

## 2018-06-28 MED ORDER — BENAZEPRIL HCL 20 MG PO TABS: 20.0000 mg | ORAL_TABLET | Freq: Every day | ORAL | 1 refills | Status: DC

## 2018-06-28 NOTE — Patient Instructions (Addendum)
F/u with mD in 4 months, call if you need me sooner  WEellness due 01/15/2019 , please sched if no appt made already  When hungry, eat vegetables, reduce sufgar, bread, biscuits and drink only water, your blood sugar is increaasing  Fasting lipid, cmp and egFR, hBA1C and TSH 1 week before visit with MD in 4 months  It is important that you exercise regularly at least 30 minutes 5 times a week. If you develop chest pain, have severe difficulty breathing, or feel very tired, stop exercising immediately and seek medical attention

## 2018-06-28 NOTE — Assessment & Plan Note (Signed)
Annual exam as documented. Counseling done  re healthy lifestyle involving commitment to 150 minutes exercise per week, heart healthy diet, and attaining healthy weight.The importance of adequate sleep also discussed. Changes in health habits are decided on by the patient with goals and time frames  set for achieving them. Immunization and cancer screening needs are specifically addressed at this visit. 

## 2018-06-28 NOTE — Progress Notes (Signed)
    Samantha Vega     MRN: [id number]      DOB: Dec 14, 1950  HPI: Patient is in for annual physical exam. No other health concerns are expressed or addressed at the visit. Recent labs, if available are reviewed. Immunization is reviewed , and  updated if needed.   PE:  BP 120/60   Pulse 95   Resp 15   Ht 5\' 5"  (1.651 m)   Wt 208 lb (94.3 kg)   SpO2 95%   BMI 34.61 kg/m   Pleasant  female, alert and oriented x 3, in no cardio-pulmonary distress. Afebrile. HEENT No facial trauma or asymetry. Sinuses non tender.  Extra occullar muscles intact, pupils equally reactive to light. External ears normal, tympanic membranes clear. Oropharynx moist, no exudate. Neck: supple, no adenopathy,JVD or thyromegaly.No bruits.  Chest: Clear to ascultation bilaterally.No crackles or wheezes. Non tender to palpation  Breast: No asymetry,no masses or lumps. No tenderness. No nipple discharge or inversion. No axillary or supraclavicular adenopathy  Cardiovascular system; Heart sounds normal,  S1 and  S2 ,no S3.  No murmur, or thrill.rregular heart rate Apical beat not displaced Peripheral pulses normal.  Abdomen: Soft, non tender, no organomegaly or masses. No bruits. Bowel sounds normal. No guarding, tenderness or rebound.     Musculoskeletal exam: Adequate though reduced  ROM of spine, hips , shoulders and knees. No deformity ,swelling or crepitus noted. No muscle wasting or atrophy.   Neurologic: Cranial nerves 2 to 12 intact. Power, tone ,sensation and reflexes normal throughout. Arthritic gait. No tremor.  Skin: Intact, no ulceration, erythema , scaling or rash noted. Pigmentation normal throughout  Psych; Normal mood and affect. Judgement and concentration normal   Assessment & Plan:  Annual physical exam Annual exam as documented. Counseling done  re healthy lifestyle involving commitment to 150 minutes exercise per week, heart healthy diet, and attaining  healthy weight.The importance of adequate sleep also discussed. . Changes in health habits are decided on by the patient with goals and time frames  set for achieving them. Immunization and cancer screening needs are specifically addressed at this visit.

## 2018-07-02 ENCOUNTER — Encounter: Payer: Self-pay | Admitting: Family Medicine

## 2018-08-02 ENCOUNTER — Ambulatory Visit (INDEPENDENT_AMBULATORY_CARE_PROVIDER_SITE_OTHER): Payer: Medicare Other | Admitting: Pharmacist

## 2018-08-02 DIAGNOSIS — Z5181 Encounter for therapeutic drug level monitoring: Secondary | ICD-10-CM

## 2018-08-02 DIAGNOSIS — I480 Paroxysmal atrial fibrillation: Secondary | ICD-10-CM

## 2018-08-02 LAB — POCT INR: INR: 2 (ref 2.0–3.0)

## 2018-08-02 NOTE — Patient Instructions (Signed)
Description   Continue coumadin 1 tablet daily except 1/2 tablet on Sundays and Thursdays Recheck in 6 wks

## 2018-09-13 ENCOUNTER — Other Ambulatory Visit: Payer: Self-pay

## 2018-09-13 ENCOUNTER — Ambulatory Visit (INDEPENDENT_AMBULATORY_CARE_PROVIDER_SITE_OTHER): Payer: Medicare Other | Admitting: *Deleted

## 2018-09-13 DIAGNOSIS — Z5181 Encounter for therapeutic drug level monitoring: Secondary | ICD-10-CM | POA: Diagnosis not present

## 2018-09-13 DIAGNOSIS — I480 Paroxysmal atrial fibrillation: Secondary | ICD-10-CM

## 2018-09-13 LAB — POCT INR: INR: 2.8 (ref 2.0–3.0)

## 2018-09-13 NOTE — Patient Instructions (Signed)
Continue coumadin 1 tablet daily except 1/2 tablet on Sundays and Thursdays Recheck in 6 wks 

## 2018-10-06 ENCOUNTER — Other Ambulatory Visit: Payer: Self-pay | Admitting: Cardiology

## 2018-10-18 ENCOUNTER — Other Ambulatory Visit: Payer: Self-pay | Admitting: Family Medicine

## 2018-10-18 DIAGNOSIS — I10 Essential (primary) hypertension: Secondary | ICD-10-CM | POA: Diagnosis not present

## 2018-10-18 DIAGNOSIS — E785 Hyperlipidemia, unspecified: Secondary | ICD-10-CM | POA: Diagnosis not present

## 2018-10-18 DIAGNOSIS — R7302 Impaired glucose tolerance (oral): Secondary | ICD-10-CM | POA: Diagnosis not present

## 2018-10-19 LAB — COMPREHENSIVE METABOLIC PANEL
ALT: 39 IU/L — ABNORMAL HIGH (ref 0–32)
AST: 29 IU/L (ref 0–40)
Albumin/Globulin Ratio: 1.2 (ref 1.2–2.2)
Albumin: 3.6 g/dL — ABNORMAL LOW (ref 3.8–4.8)
Alkaline Phosphatase: 128 IU/L — ABNORMAL HIGH (ref 39–117)
BUN/Creatinine Ratio: 18 (ref 12–28)
BUN: 16 mg/dL (ref 8–27)
Bilirubin Total: 0.3 mg/dL (ref 0.0–1.2)
CO2: 21 mmol/L (ref 20–29)
Calcium: 8.8 mg/dL (ref 8.7–10.3)
Chloride: 106 mmol/L (ref 96–106)
Creatinine, Ser: 0.89 mg/dL (ref 0.57–1.00)
GFR calc Af Amer: 76 mL/min/{1.73_m2} (ref >59)
GFR calc non Af Amer: 66 mL/min/{1.73_m2} (ref >59)
Globulin, Total: 3 g/dL (ref 1.5–4.5)
Glucose: 106 mg/dL — ABNORMAL HIGH (ref 65–99)
Potassium: 4 mmol/L (ref 3.5–5.2)
Sodium: 139 mmol/L (ref 134–144)
Total Protein: 6.6 g/dL (ref 6.0–8.5)

## 2018-10-19 LAB — LIPID PANEL W/O CHOL/HDL RATIO
Cholesterol, Total: 127 mg/dL (ref 100–199)
HDL: 32 mg/dL — ABNORMAL LOW (ref >39)
LDL Calculated: 72 mg/dL (ref 0–99)
Triglycerides: 113 mg/dL (ref 0–149)
VLDL Cholesterol Cal: 23 mg/dL (ref 5–40)

## 2018-10-19 LAB — HGB A1C W/O EAG: Hgb A1c MFr Bld: 6.5 % — ABNORMAL HIGH (ref 4.8–5.6)

## 2018-10-19 LAB — TSH: TSH: 1.54 u[IU]/mL (ref 0.450–4.500)

## 2018-10-21 ENCOUNTER — Telehealth: Payer: Self-pay | Admitting: Pharmacist

## 2018-10-21 NOTE — Telephone Encounter (Signed)
Contacted patient to switch to DOAC due to decreased in office exposure as well as better side effect profile. Patient qualifies for switch from warfarin to a DOAC and was contacted on 10/20/2017 to assess willingness to switch. Prescription cost likely $47, but will need to send to pharmacy to confirm if patient agreeable. Patient did not answer, call back instructions were provided.

## 2018-10-25 ENCOUNTER — Ambulatory Visit (INDEPENDENT_AMBULATORY_CARE_PROVIDER_SITE_OTHER): Payer: Medicare Other | Admitting: *Deleted

## 2018-10-25 ENCOUNTER — Other Ambulatory Visit: Payer: Self-pay

## 2018-10-25 DIAGNOSIS — Z5181 Encounter for therapeutic drug level monitoring: Secondary | ICD-10-CM

## 2018-10-25 DIAGNOSIS — I480 Paroxysmal atrial fibrillation: Secondary | ICD-10-CM

## 2018-10-25 LAB — POCT INR: INR: 3 (ref 2.0–3.0)

## 2018-10-25 NOTE — Patient Instructions (Signed)
Continue coumadin 1 tablet daily except 1/2 tablet on Sundays and Thursdays Recheck in 6 wks

## 2018-10-26 ENCOUNTER — Encounter: Payer: Self-pay | Admitting: Family Medicine

## 2018-10-27 ENCOUNTER — Ambulatory Visit (INDEPENDENT_AMBULATORY_CARE_PROVIDER_SITE_OTHER): Payer: Medicare Other | Admitting: Family Medicine

## 2018-10-27 ENCOUNTER — Encounter: Payer: Self-pay | Admitting: Family Medicine

## 2018-10-27 VITALS — BP 120/60 | Ht 62.0 in | Wt 208.0 lb

## 2018-10-27 DIAGNOSIS — I480 Paroxysmal atrial fibrillation: Secondary | ICD-10-CM | POA: Diagnosis not present

## 2018-10-27 DIAGNOSIS — I10 Essential (primary) hypertension: Secondary | ICD-10-CM | POA: Diagnosis not present

## 2018-10-27 DIAGNOSIS — E785 Hyperlipidemia, unspecified: Secondary | ICD-10-CM

## 2018-10-27 DIAGNOSIS — Z6838 Body mass index (BMI) 38.0-38.9, adult: Secondary | ICD-10-CM

## 2018-10-27 DIAGNOSIS — E119 Type 2 diabetes mellitus without complications: Secondary | ICD-10-CM | POA: Diagnosis not present

## 2018-10-27 NOTE — Progress Notes (Signed)
Virtual Visit via Telephone Note   This visit type was conducted due to national recommendations for restrictions regarding the COVID-19 Pandemic (e.g. social distancing) in an effort to limit this patient's exposure and mitigate transmission in our community.  Due to her co-morbid illnesses, this patient is at least at moderate risk for complications without adequate follow up.  This format is felt to be most appropriate for this patient at this time.  The patient did not have access to video technology/had technical difficulties with video requiring transitioning to audio format only (telephone).  All issues noted in this document were discussed and addressed.  No physical exam could be performed with this format.    Evaluation Performed:  Follow-up visit  Date:  10/27/2018   ID:  Samantha Vega, Samantha Vega [birth year], MRN [id number]  Patient Location: Home Provider Location: Other:  Telemedicine  Location of Patient: Home Location of Provider: Telehealth Consent was obtain for visit to be over via telehealth. I verified that I am speaking with the correct person using two identifiers.  PCP:  Fayrene Helper, MD   Chief Complaint:  Follow up chronic conditions  History of Present Illness:    Samantha Vega is a 68 y.o. female patient of Dr. Griffin Dakin.  With history of atrial fibrillation on Coumadin therapy INR at therapeutic range, type 2 diabetes-diet controlled, hyperlipidemia on Lipitor, hypertension on benazepril.  Additionally, for the above she is also taking Cardizem, Lasix, Lopressor.  Reports taking all her medications as directed.  Reports that she does not have any side effects or any difficulties taking any other medications.  She denies having any myopathy with her Lipitor.    Atrial fibrillation: she denies having any fluttering, palpitations, chest pain sensations.  Is taking her Cardizem daily.  And Lopressor.   Hypertension: Reports that she is taking her  medication as directed.  Denies having any vision changes, headaches, dizziness, chest pain, chest tightness.  Warfarin therapy: 2.5 mg every Sunday, Thursday; 5 mg all other days of the week.  She has a total of 30 mg weekly.  Her INR goal is 2.0-3.0. she is on warfarin therapy secondary to proximal atrial fibrillation, previous transient ischemic attack. Last INR check was Monday, May 11: INR was 3.0, previous reading in March 30: INR 2.8.  Diabetes: She reports that she was told that she did not have diabetes at her last check.  That her last A1c was 6.6.  Previous was 6.5.  Currently she is 6.5 at her last check May 4.  Reports that she does not really do much to control her diet but could do better.  Denies having any dizziness.  Denies having any polydipsia, polyphagia, polyuria.  Obesity: Reports that she does not really watch much of what she eats.  Reports that she exercises occasionally.  She thought she was better with her diabetes as previously stated.  Overall has no concerns today in the office.  As previously stated she has been taking her medications as directed.  Her INR is within range.  She frequently gets her INR checked at the cardiologist office.  She denies having any trouble sleeping.  She denies having any skin or unhealing wounds.  She reports going to the bathroom both bowel and bladder habits are normal and without trouble or difficulty.  She reports eating well but not watching what she eats all the time.  She denies any memory trouble or falls.  The patient does not  have symptoms concerning for COVID-19 infection (fever, chills, cough, or new shortness of breath).   Past Medical, Surgical, Social History, Allergies, and Medications have been Reviewed.   Past Medical History:  Diagnosis Date  . Arthritis   . Chest pain    Noncardiac; evaluated and treated in 2008  . Diabetes mellitus   . Enlarged heart   . Hyperlipidemia   . Hypertension   . Obesity   . PAF  (paroxysmal atrial fibrillation) ([place])   . Right bundle branch block    +Palpitations.   EKG 11/2006:  NSR, RBBB, LAFB, markedly delayed R-wave progression, no change; Echocardiogram in 10/2006-suboptimal quality, normal; Event recorder-PVCs, no significant arrhythmias or symptoms ;   Past Surgical History:  Procedure Laterality Date  . ABDOMINAL HYSTERECTOMY    . BREAST BIOPSY Left    benign  . CATARACT EXTRACTION W/PHACO Right 01/08/2016   Procedure: CATARACT EXTRACTION PHACO AND INTRAOCULAR LENS PLACEMENT (IOC);  Surgeon: Rutherford Guys, MD;  Location: [facility];  Service: Ophthalmology;  Laterality: Right;  CDE: 5.78  . CATARACT EXTRACTION W/PHACO Left 02/05/2016   Procedure: CATARACT EXTRACTION PHACO AND INTRAOCULAR LENS PLACEMENT LEFT EYE CDE=6.50;  Surgeon: Rutherford Guys, MD;  Location: [facility];  Service: Ophthalmology;  Laterality: Left;  . COLONOSCOPY  2006   Dr. Tamala Julian: Normal, repeat in 2009.  Marland Kitchen COLONOSCOPY N/A 10/08/2015   Procedure: COLONOSCOPY;  Surgeon: Danie Binder, MD;  Location: [facility];  Service: Endoscopy;  Laterality: N/A;  230 - moved to 1:30, spoke with pt   . POLYPECTOMY  10/08/2015   Procedure: POLYPECTOMY;  Surgeon: Danie Binder, MD;  Location: [facility];  Service: Endoscopy;;  cecal polyp removed via cold forceps with one clip placed/ rectal polyp removed via cold forcep  . SHOULDER SURGERY  2006   Left-post-trauma  . TOTAL ABDOMINAL HYSTERECTOMY W/ BILATERAL SALPINGOOPHORECTOMY       Current Meds  Medication Sig  . atorvastatin (LIPITOR) 80 MG tablet TAKE 1 TABLET BY MOUTH EVERY DAY  . benazepril (LOTENSIN) 20 MG tablet Take 1 tablet (20 mg total) by mouth daily.  . Calcium Carb-Cholecalciferol (CALCIUM 600+D) 600-800 MG-UNIT TABS Take 1 tablet by mouth daily.  Marland Kitchen diltiazem (CARDIZEM CD) 120 MG 24 hr capsule Take 1 capsule (120 mg total) by mouth daily.  . furosemide (LASIX) 20 MG tablet TAKE 1 TABLET BY MOUTH EVERY DAY AS NEEDED  . metoprolol tartrate  (LOPRESSOR) 100 MG tablet TAKE 1 TABLET BY MOUTH TWICE A DAY  . Multiple Vitamins-Minerals ([employer] SPECTRAVITE PO) Take 1 tablet by mouth daily.   Marland Kitchen warfarin (COUMADIN) 5 MG tablet Take 1 tablet daily except 1/2 tablet on Sunday and Thursday or as directed By [facility] Clinic     Allergies:   Patient has no known allergies.   Social History   Tobacco Use  . Smoking status: Never Smoker  . Smokeless tobacco: Never Used  Substance Use Topics  . Alcohol use: No  . Drug use: No     Family Hx: The patient's family history includes Arthritis in her mother; Congestive Heart Failure in her brother; Coronary artery disease in her father; Diabetes in her sister and sister; Heart failure in her brother; Hypertension in her father and mother; Sarcoidosis in her sister; Stroke in her father. There is no history of Colon cancer.  ROS:   Please see the history of present illness.    Review of Systems  Constitutional: Negative for chills and fever.  HENT: Negative for  congestion, sinus pain and sore throat.   Eyes: Negative.   Respiratory: Negative for cough and shortness of breath.   Cardiovascular: Negative for chest pain and palpitations.  Gastrointestinal: Negative.   Genitourinary: Negative.   Musculoskeletal: Negative.   Skin: Negative.   Neurological: Negative for dizziness and headaches.  Psychiatric/Behavioral: Negative.   All other systems reviewed and are negative.   Labs/Other Tests and Data Reviewed:    Recent Labs: 02/14/2018: Hemoglobin 13.4; Platelets 211 10/18/2018: ALT 39; BUN 16; Creatinine, Ser 0.89; Potassium 4.0; Sodium 139; TSH 1.540   Recent Lipid Panel Lab Results  Component Value Date/Time   CHOL 127 10/18/2018 08:00 AM   TRIG 113 10/18/2018 08:00 AM   HDL 32 (L) 10/18/2018 08:00 AM   CHOLHDL 4.0 02/25/2018 08:47 AM   LDLCALC 72 10/18/2018 08:00 AM   LDLCALC 85 02/25/2018 08:47 AM    Wt Readings from Last 3 Encounters:  10/26/18 208 lb (94.3 kg)  06/28/18 208  lb (94.3 kg)  02/25/18 206 lb (93.4 kg)     Objective:    Vital Signs:  BP 120/60   Ht 5\' 2"  (1.575 m)   Wt 208 lb (94.3 kg)   BMI 38.04 kg/m    VITAL SIGNS:  reviewed GEN:  Alert and oriented RESPIRATORY:  No noted signs of shortness of breath in conversation PSYCH:  Normal affect, mood.  Good communication during visit.  ASSESSMENT & PLAN:    1. Class 2 severe obesity due to excess calories with serious comorbidity and body mass index (BMI) of 38.0 to 38.9 in adult [facility]) She has put on weight in the last couple of months.  Reeducated about the importance of committing 150 minutes of exercise per week.  Patient verbalized this unsure of how adherent to it she will be.  Additionally discussed the importance of healthy food choices with portion control.    2. Diabetes mellitus type 2, diet-controlled ([place]) Reported that she thought that she was not diabetic anymore.  Discussed that she is diabetic.  Went over the A1c levels over the last year.  Reminded her of previous information that Dr. Moshe Cipro had provided with her.  Additionally encouraged her to commit to daily physical activity for 30 minutes or more.  To work on limiting carbohydrate intake to 30 to 60 g per meal.  She was provided with a diabetic diet plan and information in her AVS which will be mailed to her.  Advised that she follow a diabetic, low-fat, low-sodium heart healthy diet.  Along with working out.  To help keep her diabetes within range and out of the need of medication.  3. Paroxysmal atrial fibrillation (HCC) Controlled, followed by cardiology for this.  They manage medications at this time.  Appreciate collaboration in her care if they need anything from primary please just let us know.  Last INR was within range.  4. Essential hypertension .  Previous visits demonstrated control, no medication change at this time.  Advised of the DASH diet again and provided with her AVS and mailed out.  Additionally encouraged  for daily physical activity of a minimum of 30 minutes was also encouraged.  5. Hyperlipidemia LDL goal <100 On Lipitor reports taking it without issue.  Low HDL advised to try to follow a low-fat diet and start physical activity to help boost her HDL.   Time:   Today, I have spent 15 minutes with the patient with telehealth technology discussing the above problems.  Medication Adjustments/Labs and Tests Ordered: Current medicines are reviewed at length with the patient today.  Concerns regarding medicines are outlined above.   Tests Ordered: No orders of the defined types were placed in this encounter.   Medication Changes: No orders of the defined types were placed in this encounter.   Disposition:  Follow up in 5 month(s)  Signed, Perlie Mayo, NP  10/27/2018 11:06 AM     [place]

## 2018-10-27 NOTE — Patient Instructions (Addendum)
Thank you for completing your visit via telephone. I appreciate the opportunity to provide you with the care for your health and wellness. Today we discussed: Overall health and wellness  Please continue taking your medications as directed.  If you have any trouble or difficulty with any of these please do not hesitate to reach out to the provider that has prescribed them.  Please work to try to get 30 minutes of exercise on 5 days of the week.  For total of 150 minutes of exercise weekly.  Please continue to work on your diet to help keep your diabetes under control.  I have attached diets for your review. These can help with both your blood pressure and your diabetes along with weight loss.  We look forward to seeing you in the coming months back in the office.  Please take care until then.  And know that we are here if you need anything.  Please continue to practice social distancing during this time to keep you, your family and our community safe.  Mora YOUR HANDS WELL AND FREQUENTLY. AVOID TOUCHING YOUR FACE, UNLESS YOUR HANDS ARE FRESHLY WASHED.  GET FRESH AIR DAILY. STAY HYDRATED WITH WATER.   It was a pleasure to see you and I look forward to continuing to work together on your health and well-being. Please do not hesitate to call the office if you need care or have questions about your care.  Have a wonderful day and week. With Gratitude, Cherly Beach, DNP, AGNP-BC   Diabetes Mellitus and Nutrition, Adult When you have diabetes (diabetes mellitus), it is very important to have healthy eating habits because your blood sugar (glucose) levels are greatly affected by what you eat and drink. Eating healthy foods in the appropriate amounts, at about the same times every day, can help you:  Control your blood glucose.  Lower your risk of heart disease.  Improve your blood pressure.  Reach or maintain a healthy weight. Every person with diabetes is different, and each  person has different needs for a meal plan. Your health care provider may recommend that you work with a diet and nutrition specialist (dietitian) to make a meal plan that is best for you. Your meal plan may vary depending on factors such as:  The calories you need.  The medicines you take.  Your weight.  Your blood glucose, blood pressure, and cholesterol levels.  Your activity level.  Other health conditions you have, such as heart or kidney disease. How do carbohydrates affect me? Carbohydrates, also called carbs, affect your blood glucose level more than any other type of food. Eating carbs naturally raises the amount of glucose in your blood. Carb counting is a method for keeping track of how many carbs you eat. Counting carbs is important to keep your blood glucose at a healthy level, especially if you use insulin or take certain oral diabetes medicines. It is important to know how many carbs you can safely have in each meal. This is different for every person. Your dietitian can help you calculate how many carbs you should have at each meal and for each snack. Foods that contain carbs include:  Bread, cereal, rice, pasta, and crackers.  Potatoes and corn.  Peas, beans, and lentils.  Milk and yogurt.  Fruit and juice.  Desserts, such as cakes, cookies, ice cream, and candy. How does alcohol affect me? Alcohol can cause a sudden decrease in blood glucose (hypoglycemia), especially if you use insulin or  take certain oral diabetes medicines. Hypoglycemia can be a life-threatening condition. Symptoms of hypoglycemia (sleepiness, dizziness, and confusion) are similar to symptoms of having too much alcohol. If your health care provider says that alcohol is safe for you, follow these guidelines:  Limit alcohol intake to no more than 1 drink per day for nonpregnant women and 2 drinks per day for men. One drink equals 12 oz of beer, 5 oz of wine, or 1 oz of hard liquor.  Do not drink  on an empty stomach.  Keep yourself hydrated with water, diet soda, or unsweetened iced tea.  Keep in mind that regular soda, juice, and other mixers may contain a lot of sugar and must be counted as carbs. What are tips for following this plan?  Reading food labels  Start by checking the serving size on the "Nutrition Facts" label of packaged foods and drinks. The amount of calories, carbs, fats, and other nutrients listed on the label is based on one serving of the item. Many items contain more than one serving per package.  Check the total grams (g) of carbs in one serving. You can calculate the number of servings of carbs in one serving by dividing the total carbs by 15. For example, if a food has 30 g of total carbs, it would be equal to 2 servings of carbs.  Check the number of grams (g) of saturated and trans fats in one serving. Choose foods that have low or no amount of these fats.  Check the number of milligrams (mg) of salt (sodium) in one serving. Most people should limit total sodium intake to less than 2,300 mg per day.  Always check the nutrition information of foods labeled as "low-fat" or "nonfat". These foods may be higher in added sugar or refined carbs and should be avoided.  Talk to your dietitian to identify your daily goals for nutrients listed on the label. Shopping  Avoid buying canned, premade, or processed foods. These foods tend to be high in fat, sodium, and added sugar.  Shop around the outside edge of the grocery store. This includes fresh fruits and vegetables, bulk grains, fresh meats, and fresh dairy. Cooking  Use low-heat cooking methods, such as baking, instead of high-heat cooking methods like deep frying.  Cook using healthy oils, such as olive, canola, or sunflower oil.  Avoid cooking with butter, cream, or high-fat meats. Meal planning  Eat meals and snacks regularly, preferably at the same times every day. Avoid going long periods of time  without eating.  Eat foods high in fiber, such as fresh fruits, vegetables, beans, and whole grains. Talk to your dietitian about how many servings of carbs you can eat at each meal.  Eat 4-6 ounces (oz) of lean protein each day, such as lean meat, chicken, fish, eggs, or tofu. One oz of lean protein is equal to: ? 1 oz of meat, chicken, or fish. ? 1 egg. ?  cup of tofu.  Eat some foods each day that contain healthy fats, such as avocado, nuts, seeds, and fish. Lifestyle  Check your blood glucose regularly.  Exercise regularly as told by your health care provider. This may include: ? 150 minutes of moderate-intensity or vigorous-intensity exercise each week. This could be brisk walking, biking, or water aerobics. ? Stretching and doing strength exercises, such as yoga or weightlifting, at least 2 times a week.  Take medicines as told by your health care provider.  Do not use any products that  contain nicotine or tobacco, such as cigarettes and e-cigarettes. If you need help quitting, ask your health care provider.  Work with a Social worker or diabetes educator to identify strategies to manage stress and any emotional and social challenges. Questions to ask a health care provider  Do I need to meet with a diabetes educator?  Do I need to meet with a dietitian?  What number can I call if I have questions?  When are the best times to check my blood glucose? Where to find more information:  American Diabetes Association: diabetes.org  Academy of Nutrition and Dietetics: www.eatright.[employer] of Diabetes and Digestive and Kidney Diseases (NIH): [employer] Summary  A healthy meal plan will help you control your blood glucose and maintain a healthy lifestyle.  Working with a diet and nutrition specialist (dietitian) can help you make a meal plan that is best for you.  Keep in mind that carbohydrates (carbs) and alcohol have immediate effects on your blood  glucose levels. It is important to count carbs and to use alcohol carefully. This information is not intended to replace advice given to you by your health care provider. Make sure you discuss any questions you have with your health care provider. Document Released: 02/27/2005 Document Revised: 12/31/2016 Document Reviewed: 07/07/2016 Elsevier Interactive Patient Education  2019 [place] DASH stands for "Dietary Approaches to Stop Hypertension." The DASH eating plan is a healthy eating plan that has been shown to reduce high blood pressure (hypertension). It may also reduce your risk for type 2 diabetes, heart disease, and stroke. The DASH eating plan may also help with weight loss. What are tips for following this plan?  General guidelines  Avoid eating more than 2,300 mg (milligrams) of salt (sodium) a day. If you have hypertension, you may need to reduce your sodium intake to 1,500 mg a day.  Limit alcohol intake to no more than 1 drink a day for nonpregnant women and 2 drinks a day for men. One drink equals 12 oz of beer, 5 oz of wine, or 1 oz of hard liquor.  Work with your health care provider to maintain a healthy body weight or to lose weight. Ask what an ideal weight is for you.  Get at least 30 minutes of exercise that causes your heart to beat faster (aerobic exercise) most days of the week. Activities may include walking, swimming, or biking.  Work with your health care provider or diet and nutrition specialist (dietitian) to adjust your eating plan to your individual calorie needs. Reading food labels   Check food labels for the amount of sodium per serving. Choose foods with less than 5 percent of the Daily Value of sodium. Generally, foods with less than 300 mg of sodium per serving fit into this eating plan.  To find whole grains, look for the word "whole" as the first word in the ingredient list. Shopping  Buy products labeled as "low-sodium" or  "no salt added."  Buy fresh foods. Avoid canned foods and premade or frozen meals. Cooking  Avoid adding salt when cooking. Use salt-free seasonings or herbs instead of table salt or sea salt. Check with your health care provider or pharmacist before using salt substitutes.  Do not fry foods. Cook foods using healthy methods such as baking, boiling, grilling, and broiling instead.  Cook with heart-healthy oils, such as olive, canola, soybean, or sunflower oil. Meal planning  Eat a balanced diet that includes: ? 5  or more servings of fruits and vegetables each day. At each meal, try to fill half of your plate with fruits and vegetables. ? Up to 6-8 servings of whole grains each day. ? Less than 6 oz of lean meat, poultry, or fish each day. A 3-oz serving of meat is about the same size as a deck of cards. One egg equals 1 oz. ? 2 servings of low-fat dairy each day. ? A serving of nuts, seeds, or beans 5 times each week. ? Heart-healthy fats. Healthy fats called Omega-3 fatty acids are found in foods such as flaxseeds and coldwater fish, like sardines, salmon, and mackerel.  Limit how much you eat of the following: ? Canned or prepackaged foods. ? Food that is high in trans fat, such as fried foods. ? Food that is high in saturated fat, such as fatty meat. ? Sweets, desserts, sugary drinks, and other foods with added sugar. ? Full-fat dairy products.  Do not salt foods before eating.  Try to eat at least 2 vegetarian meals each week.  Eat more home-cooked food and less restaurant, buffet, and fast food.  When eating at a restaurant, ask that your food be prepared with less salt or no salt, if possible. What foods are recommended? The items listed may not be a complete list. Talk with your dietitian about what dietary choices are best for you. Grains Whole-grain or whole-wheat bread. Whole-grain or whole-wheat pasta. Brown rice. [place]. Bulgur. Whole-grain and low-sodium  cereals. Pita bread. Low-fat, low-sodium crackers. Whole-wheat flour tortillas. Vegetables Fresh or frozen vegetables (raw, steamed, roasted, or grilled). Low-sodium or reduced-sodium tomato and vegetable juice. Low-sodium or reduced-sodium tomato sauce and tomato paste. Low-sodium or reduced-sodium canned vegetables. Fruits All fresh, dried, or frozen fruit. Canned fruit in natural juice (without added sugar). Meat and other protein foods Skinless chicken or [place]. Ground chicken or [place]. Pork with fat trimmed off. Fish and seafood. Egg whites. Dried beans, peas, or lentils. Unsalted nuts, nut butters, and seeds. Unsalted canned beans. Lean cuts of beef with fat trimmed off. Low-sodium, lean deli meat. Dairy Low-fat (1%) or fat-free (skim) milk. Fat-free, low-fat, or reduced-fat cheeses. Nonfat, low-sodium ricotta or cottage cheese. Low-fat or nonfat yogurt. Low-fat, low-sodium cheese. Fats and oils Soft margarine without trans fats. Vegetable oil. Low-fat, reduced-fat, or light mayonnaise and salad dressings (reduced-sodium). Canola, safflower, olive, soybean, and sunflower oils. Avocado. Seasoning and other foods Herbs. Spices. Seasoning mixes without salt. Unsalted popcorn and pretzels. Fat-free sweets. What foods are not recommended? The items listed may not be a complete list. Talk with your dietitian about what dietary choices are best for you. Grains Baked goods made with fat, such as croissants, muffins, or some breads. Dry pasta or rice meal packs. Vegetables Creamed or fried vegetables. Vegetables in a cheese sauce. Regular canned vegetables (not low-sodium or reduced-sodium). Regular canned tomato sauce and paste (not low-sodium or reduced-sodium). Regular tomato and vegetable juice (not low-sodium or reduced-sodium). Angie Fava. Olives. Fruits Canned fruit in a light or heavy syrup. Fried fruit. Fruit in cream or butter sauce. Meat and other protein foods Fatty cuts of meat. Ribs.  Fried meat. Berniece Salines. Sausage. Bologna and other processed lunch meats. Salami. Fatback. Hotdogs. Bratwurst. Salted nuts and seeds. Canned beans with added salt. Canned or smoked fish. Whole eggs or egg yolks. Chicken or [place] with skin. Dairy Whole or 2% milk, cream, and half-and-half. Whole or full-fat cream cheese. Whole-fat or sweetened yogurt. Full-fat cheese. Nondairy creamers. Whipped toppings. Processed cheese  and cheese spreads. Fats and oils Butter. Stick margarine. Lard. Shortening. Ghee. Bacon fat. Tropical oils, such as coconut, palm kernel, or palm oil. Seasoning and other foods Salted popcorn and pretzels. Onion salt, garlic salt, seasoned salt, table salt, and sea salt. Worcestershire sauce. Tartar sauce. Barbecue sauce. Teriyaki sauce. Soy sauce, including reduced-sodium. Steak sauce. Canned and packaged gravies. Fish sauce. Oyster sauce. Cocktail sauce. Horseradish that you find on the shelf. Ketchup. Mustard. Meat flavorings and tenderizers. Bouillon cubes. Hot sauce and Tabasco sauce. Premade or packaged marinades. Premade or packaged taco seasonings. Relishes. Regular salad dressings. Where to find more information:  National Heart, Lung, and [place]: https://wilson-eaton.com/  American Heart Association: www.heart.org Summary  The DASH eating plan is a healthy eating plan that has been shown to reduce high blood pressure (hypertension). It may also reduce your risk for type 2 diabetes, heart disease, and stroke.  With the DASH eating plan, you should limit salt (sodium) intake to 2,300 mg a day. If you have hypertension, you may need to reduce your sodium intake to 1,500 mg a day.  When on the DASH eating plan, aim to eat more fresh fruits and vegetables, whole grains, lean proteins, low-fat dairy, and heart-healthy fats.  Work with your health care provider or diet and nutrition specialist (dietitian) to adjust your eating plan to your individual calorie needs. This  information is not intended to replace advice given to you by your health care provider. Make sure you discuss any questions you have with your health care provider. Document Released: 05/22/2011 Document Revised: 05/26/2016 Document Reviewed: 05/26/2016 Elsevier Interactive Patient Education  2019 [employer].

## 2018-11-02 ENCOUNTER — Other Ambulatory Visit (HOSPITAL_COMMUNITY): Payer: Self-pay | Admitting: Family Medicine

## 2018-11-02 DIAGNOSIS — Z1231 Encounter for screening mammogram for malignant neoplasm of breast: Secondary | ICD-10-CM

## 2018-11-17 ENCOUNTER — Telehealth: Payer: Self-pay | Admitting: Family Medicine

## 2018-11-17 NOTE — Telephone Encounter (Signed)
-----   Message from Fayrene Helper, MD sent at 11/16/2018  4:24 PM EDT ----- Regarding: arrange in office visit with me tomorrow if possible, lab review from May lab and f/u pls

## 2018-11-17 NOTE — Telephone Encounter (Signed)
LVM for the pt to call the office to schedule an appt.

## 2018-11-24 ENCOUNTER — Other Ambulatory Visit: Payer: Self-pay

## 2018-11-24 ENCOUNTER — Ambulatory Visit (HOSPITAL_COMMUNITY)
Admission: RE | Admit: 2018-11-24 | Discharge: 2018-11-24 | Disposition: A | Discharge status: Home / Self Care | Payer: Medicare Other | Source: Ambulatory Visit | Attending: Family Medicine | Admitting: Family Medicine

## 2018-11-24 DIAGNOSIS — Z1231 Encounter for screening mammogram for malignant neoplasm of breast: Secondary | ICD-10-CM

## 2018-12-06 ENCOUNTER — Ambulatory Visit (INDEPENDENT_AMBULATORY_CARE_PROVIDER_SITE_OTHER): Payer: Medicare Other | Admitting: *Deleted

## 2018-12-06 DIAGNOSIS — Z5181 Encounter for therapeutic drug level monitoring: Secondary | ICD-10-CM

## 2018-12-06 DIAGNOSIS — I480 Paroxysmal atrial fibrillation: Secondary | ICD-10-CM | POA: Diagnosis not present

## 2018-12-06 LAB — POCT INR: INR: 2.9 (ref 2.0–3.0)

## 2018-12-06 NOTE — Patient Instructions (Signed)
Continue coumadin 1 tablet daily except 1/2 tablet on Sundays and Thursdays Recheck in 6 wks

## 2018-12-23 IMAGING — DX DG CHEST 2V
2 series · 2 of 2 positions shown · non-contrast
Comparison: Single-view of the chest 07/13/2012 and 05/22/2012.

CLINICAL DATA: Onset chest pain and shortness of breath today.

EXAM:
CHEST - 2 VIEW

[chest lat]
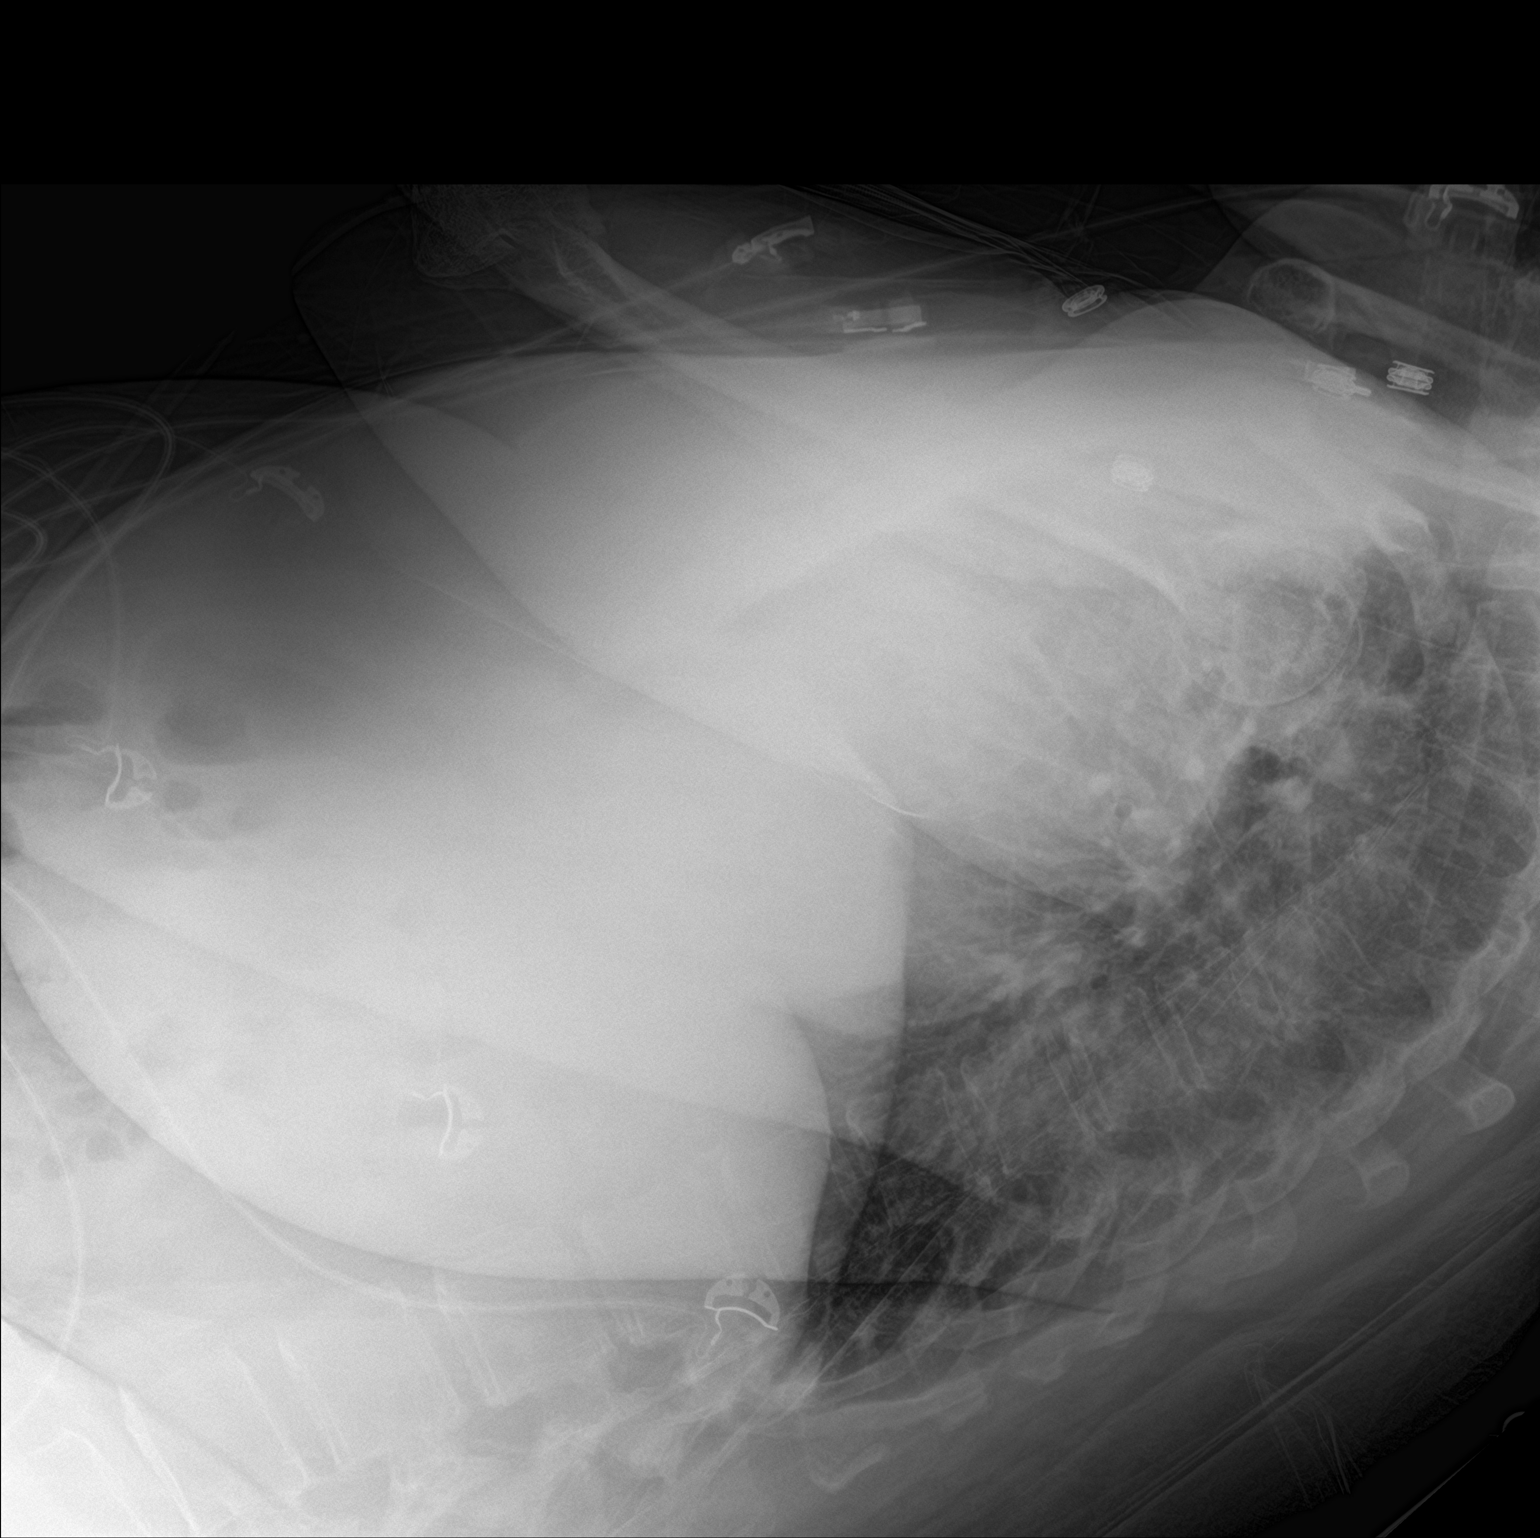

[chest ap]
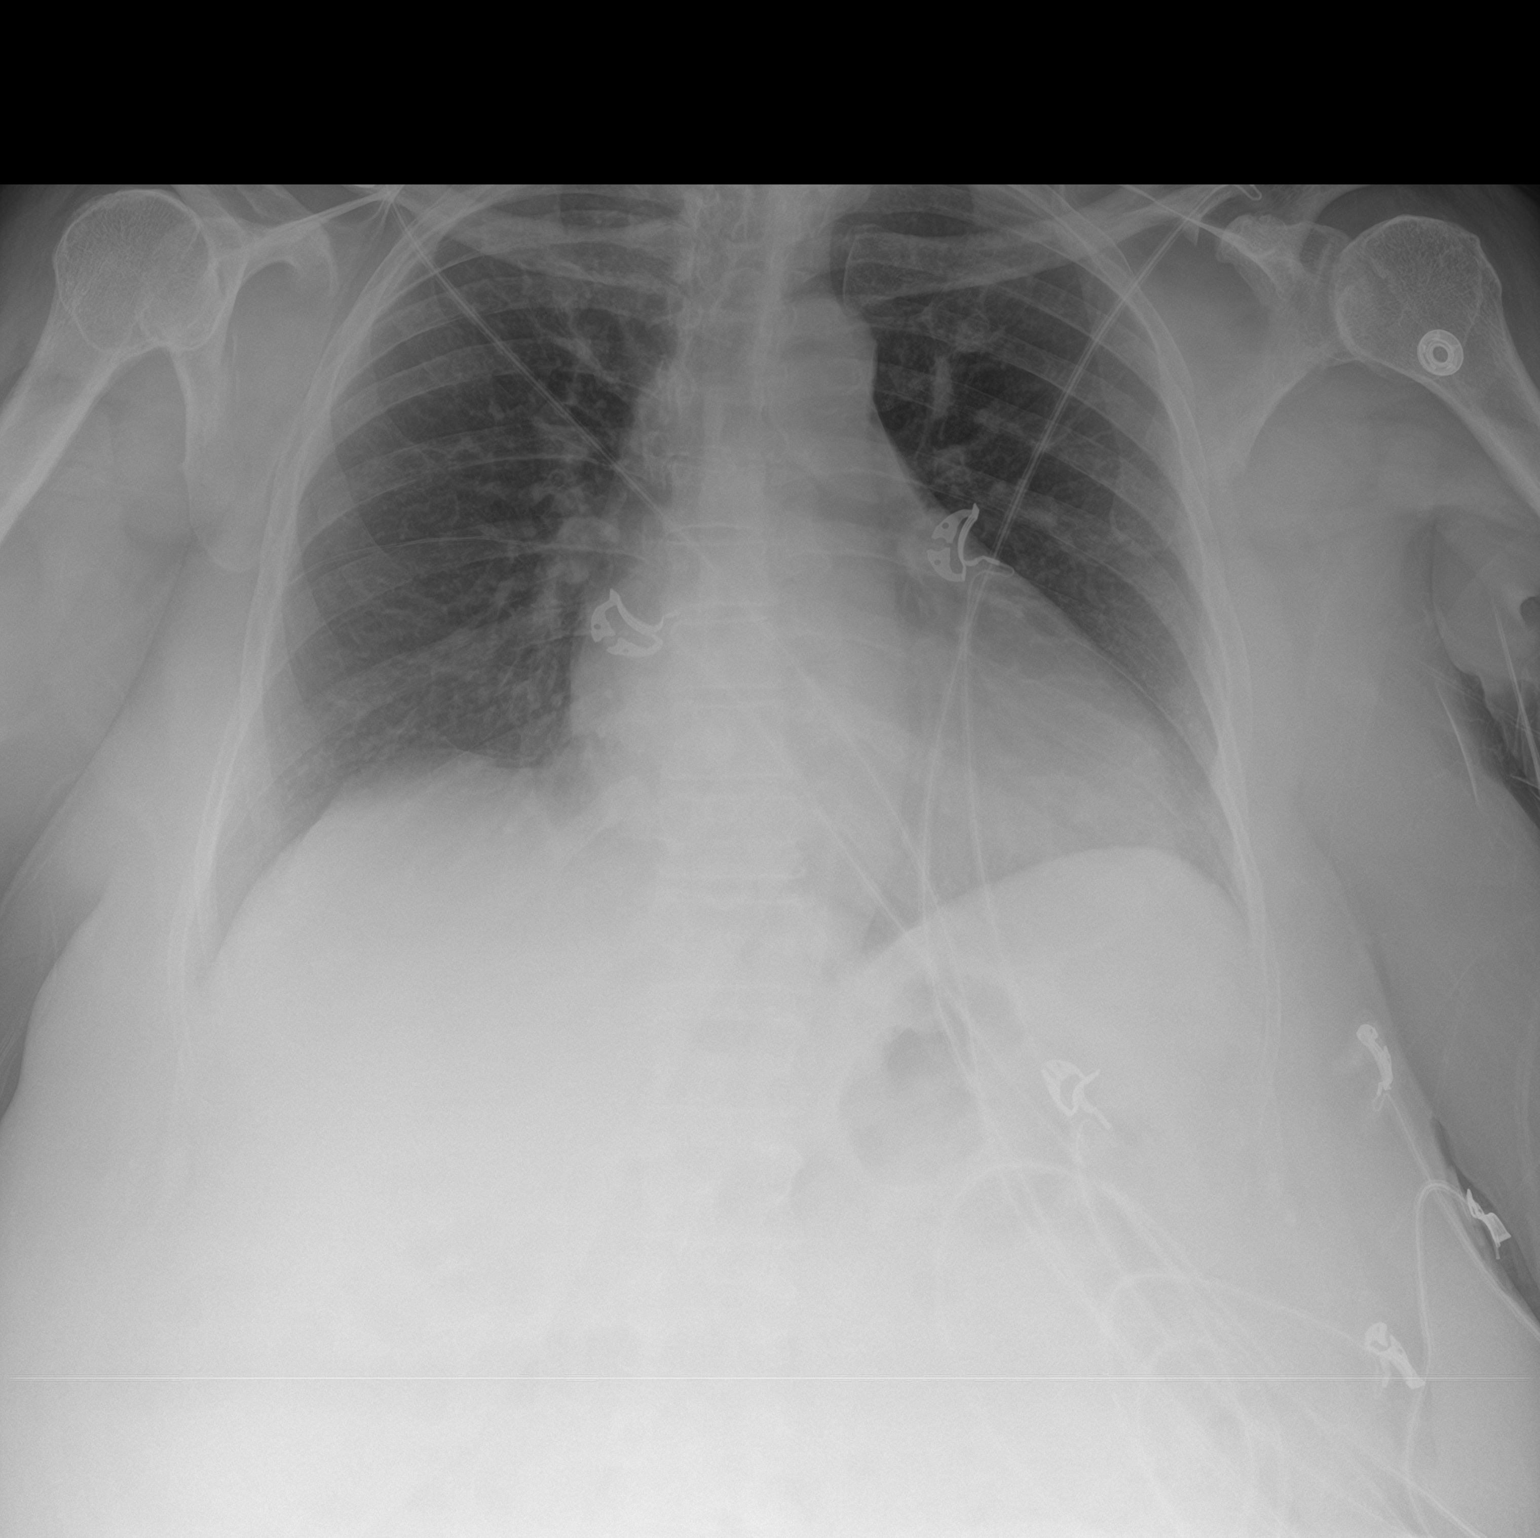

[2 of 2 positions shown; findings below may reference images not displayed]

FINDINGS: There is cardiomegaly without edema. Lungs clear. No pneumothorax or
pleural effusion. No acute or focal bony abnormality.
IMPRESSION: Cardiomegaly without acute disease.

## 2019-01-17 ENCOUNTER — Ambulatory Visit: Payer: Medicare Other

## 2019-01-18 ENCOUNTER — Encounter: Payer: Self-pay | Admitting: Family Medicine

## 2019-01-18 ENCOUNTER — Ambulatory Visit (INDEPENDENT_AMBULATORY_CARE_PROVIDER_SITE_OTHER): Payer: Medicare Other | Admitting: Family Medicine

## 2019-01-18 ENCOUNTER — Other Ambulatory Visit: Payer: Self-pay

## 2019-01-18 VITALS — BP 120/60 | HR 95 | Resp 12 | Ht 62.0 in | Wt 208.0 lb

## 2019-01-18 DIAGNOSIS — Z0000 Encounter for general adult medical examination without abnormal findings: Secondary | ICD-10-CM | POA: Diagnosis not present

## 2019-01-18 NOTE — Progress Notes (Signed)
Subjective:   Samantha Vega is a 68 y.o. female who presents for Medicare Annual (Subsequent) preventive examination.   Location of Patient: Home Location of Provider: Telehealth Consent was obtain for visit to be over via telehealth.  I verified that I am speaking with the correct person using two identifiers.   Review of Systems:  Cardiac Risk Factors include: advanced age (>67men, >79 women);diabetes mellitus;dyslipidemia;hypertension;obesity (BMI >30kg/m2)     Objective:     Vitals: BP 120/60   Pulse 95   Resp 12   Ht 5\' 2"  (1.575 m)   Wt 208 lb (94.3 kg)   BMI 38.04 kg/m   Body mass index is 38.04 kg/m.  Advanced Directives 02/14/2018 02/19/2017 10/09/2016 02/05/2016 01/03/2016 10/08/2015 09/06/2014  Does Patient Have a Medical Advance Directive? No No No No No No No  Would patient like information on creating a medical advance directive? - - Yes (MAU/Ambulatory/Procedural Areas - Information given) No - patient declined information No - patient declined information No - patient declined information Yes - Educational materials given  Pre-existing out of facility DNR order (yellow form or pink MOST form) - - - - - - -    Tobacco Social History   Tobacco Use  Smoking Status Never Smoker  Smokeless Tobacco Never Used     Counseling given: Yes   Clinical Intake:  Pre-visit preparation completed: Yes  Pain : No/denies pain Pain Score: 0-No pain     BMI - recorded: 38.04 Nutritional Status: BMI > 30  Obese Nutritional Risks: None Diabetes: Yes CBG done?: No Did pt. bring in CBG monitor from home?: No  How often do you need to have someone help you when you read instructions, pamphlets, or other written materials from your doctor or pharmacy?: 1 - Never What is the last grade level you completed in school?: 10  Interpreter Needed?: No     Past Medical History:  Diagnosis Date  . Arthritis   . Chest pain    Noncardiac; evaluated and treated in 2008   . Diabetes mellitus   . Enlarged heart   . Hyperlipidemia   . Hypertension   . Obesity   . PAF (paroxysmal atrial fibrillation) ([place])   . Right bundle branch block    +Palpitations.   EKG 11/2006:  NSR, RBBB, LAFB, markedly delayed R-wave progression, no change; Echocardiogram in 10/2006-suboptimal quality, normal; Event recorder-PVCs, no significant arrhythmias or symptoms ;   Past Surgical History:  Procedure Laterality Date  . ABDOMINAL HYSTERECTOMY    . BREAST BIOPSY Left    benign  . CATARACT EXTRACTION W/PHACO Right 01/08/2016   Procedure: CATARACT EXTRACTION PHACO AND INTRAOCULAR LENS PLACEMENT (IOC);  Surgeon: Rutherford Guys, MD;  Location: [facility];  Service: Ophthalmology;  Laterality: Right;  CDE: 5.78  . CATARACT EXTRACTION W/PHACO Left 02/05/2016   Procedure: CATARACT EXTRACTION PHACO AND INTRAOCULAR LENS PLACEMENT LEFT EYE CDE=6.50;  Surgeon: Rutherford Guys, MD;  Location: [facility];  Service: Ophthalmology;  Laterality: Left;  . COLONOSCOPY  2006   Dr. Tamala Julian: Normal, repeat in 2009.  Marland Kitchen COLONOSCOPY N/A 10/08/2015   Procedure: COLONOSCOPY;  Surgeon: Danie Binder, MD;  Location: [facility];  Service: Endoscopy;  Laterality: N/A;  230 - moved to 1:30, spoke with pt   . POLYPECTOMY  10/08/2015   Procedure: POLYPECTOMY;  Surgeon: Danie Binder, MD;  Location: [facility];  Service: Endoscopy;;  cecal polyp removed via cold forceps with one clip placed/ rectal polyp removed via  cold forcep  . SHOULDER SURGERY  2006   Left-post-trauma  . TOTAL ABDOMINAL HYSTERECTOMY W/ BILATERAL SALPINGOOPHORECTOMY     Family History  Problem Relation Age of Onset  . Hypertension Mother   . Arthritis Mother   . Stroke Father   . Hypertension Father   . Coronary artery disease Father   . Diabetes Sister   . Sarcoidosis Sister   . Diabetes Sister   . Heart failure Brother   . Congestive Heart Failure Brother   . Colon cancer Neg Hx    Social History   Socioeconomic History  . Marital  status: Married    Spouse name: Not on file  . Number of children: 0  . Years of education: Not on file  . Highest education level: Not on file  Occupational History  . Occupation: Museum/gallery exhibitions officer: UNEMPLOYED  Social Needs  . Financial resource strain: Not hard at all  . Food insecurity    Worry: Never true    Inability: Never true  . Transportation needs    Medical: No    Non-medical: No  Tobacco Use  . Smoking status: Never Smoker  . Smokeless tobacco: Never Used  Substance and Sexual Activity  . Alcohol use: No  . Drug use: No  . Sexual activity: Yes    Birth control/protection: Surgical  Lifestyle  . Physical activity    Days per week: 2 days    Minutes per session: 30 min  . Stress: Not at all  Relationships  . Social connections    Talks on phone: More than three times a week    Gets together: More than three times a week    Attends religious service: More than 4 times per year    Active member of club or organization: No    Attends meetings of clubs or organizations: Never    Relationship status: Married  Other Topics Concern  . Not on file  Social History Narrative  . Not on file    Outpatient Encounter Medications as of 01/18/2019  Medication Sig  . atorvastatin (LIPITOR) 80 MG tablet TAKE 1 TABLET BY MOUTH EVERY DAY  . benazepril (LOTENSIN) 20 MG tablet Take 1 tablet (20 mg total) by mouth daily.  . Calcium Carb-Cholecalciferol (CALCIUM 600+D) 600-800 MG-UNIT TABS Take 1 tablet by mouth daily.  Marland Kitchen diltiazem (CARDIZEM CD) 120 MG 24 hr capsule Take 1 capsule (120 mg total) by mouth daily.  . furosemide (LASIX) 20 MG tablet TAKE 1 TABLET BY MOUTH EVERY DAY AS NEEDED  . metoprolol tartrate (LOPRESSOR) 100 MG tablet TAKE 1 TABLET BY MOUTH TWICE A DAY  . Multiple Vitamins-Minerals ([facility] SPECTRAVITE PO) Take 1 tablet by mouth daily.   Marland Kitchen warfarin (COUMADIN) 5 MG tablet Take 1 tablet daily except 1/2 tablet on Sunday and Thursday or as directed By  [facility]   No facility-administered encounter medications on file as of 01/18/2019.     Activities of Daily Living In your present state of health, do you have any difficulty performing the following activities: 01/18/2019  Hearing? N  Vision? N  Difficulty concentrating or making decisions? N  Walking or climbing stairs? N  Dressing or bathing? N  Doing errands, shopping? N  Preparing Food and eating ? N  Using the Toilet? N  In the past six months, have you accidently leaked urine? N  Do you have problems with loss of bowel control? N  Managing your Medications? N  Managing your  Finances? N  Housekeeping or managing your Housekeeping? N  Some recent data might be hidden    Patient Care Team: Fayrene Helper, MD as PCP - [facility], Alphonse Guild, MD as Consulting Physician (Cardiology) Danie Binder, MD as Consulting Physician (Gastroenterology)    Assessment:   This is a routine wellness examination for Shawnetta.  Exercise Activities and Dietary recommendations Current Exercise Habits: Home exercise routine, Type of exercise: walking, Time (Minutes): 20, Frequency (Times/Week): 4, Weekly Exercise (Minutes/Week): 80, Intensity: Mild, Exercise limited by: None identified  Goals    . Exercise 3x per week (30 min per time)     Recommend starting a routine exercise program at least 3 days a week for 30-45 minutes at a time as tolerated.      . Increase physical activity     Start going to the [place]  01/18/2019 10/26/2018 06/28/2018 02/25/2018 01/12/2018  Falls in the past year? 0 0 0 No No  Injury with Fall? 0 0 - - -   Is the patient's home free of loose throw rugs in walkways, pet beds, electrical cords, etc?   yes      Grab bars in the bathroom? yes      Handrails on the stairs?   yes      Adequate lighting?   yes   Depression Screen PHQ 2/9 Scores 01/18/2019 10/26/2018 06/28/2018 02/25/2018  PHQ - 2 Score 0 0 0 0  PHQ- 9 Score - - -  -     Cognitive Function     6CIT Screen 01/18/2019 01/12/2018 10/09/2016  What Year? 0 points 0 points 0 points  What month? 0 points 0 points 0 points  What time? 0 points 0 points 0 points  Count back from 20 4 points 2 points 0 points  Months in reverse 4 points 4 points 0 points  Repeat phrase 0 points 0 points 0 points  Total Score 8 6 0    Immunization History  Administered Date(s) Administered  . Influenza Split 04/22/2011, 04/16/2012  . Influenza Whole 03/23/2007, 05/18/2009  . Influenza,inj,Quad PF,6+ Mos 04/25/2013, 05/30/2014, 06/07/2015, 02/12/2016, 04/13/2017, 02/25/2018  . Pneumococcal Conjugate-13 09/06/2014  . Pneumococcal Polysaccharide-23 10/31/2009, 02/12/2016  . Td 06/17/2003  . Tdap 04/22/2011  . Zoster 05/18/2009    Qualifies for Shingles Vaccine? completed  Screening Tests Health Maintenance  Topic Date Due  . OPHTHALMOLOGY EXAM  12/09/2017  . INFLUENZA VACCINE  01/15/2019  . FOOT EXAM  02/26/2019  . HEMOGLOBIN A1C  04/20/2019  . COLONOSCOPY  10/08/2020  . MAMMOGRAM  11/23/2020  . TETANUS/TDAP  04/21/2021  . DEXA SCAN  Completed  . Hepatitis C Screening  Completed  . PNA vac Low Risk Adult  Completed    Cancer Screenings: Lung: Low Dose CT Chest recommended if Age 104-80 years, 30 pack-year currently smoking OR have quit w/in 15years. Patient does not qualify. Breast:  Up to date on Mammogram? Yes   Up to date of Bone Density/Dexa? Yes Colorectal:  Due 2022  Additional Screenings:   Hepatitis C Screening:  Completed      Plan:       1. Encounter for Medicare annual wellness exam  I have personally reviewed and noted the following in the patient's chart:   . Medical and social history . Use of alcohol, tobacco or illicit drugs  . Current medications and supplements . Functional ability and status . Nutritional status .  Physical activity . Advanced directives . List of other physicians . Hospitalizations, surgeries, and ER visits  in previous 12 months . Vitals . Screenings to include cognitive, depression, and falls . Referrals and appointments  In addition, I have reviewed and discussed with patient certain preventive protocols, quality metrics, and best practice recommendations. A written personalized care plan for preventive services as well as general preventive health recommendations were provided to patient.     I provided 20 minutes of non-face-to-face time during this encounter.   Perlie Mayo, NP  01/18/2019

## 2019-01-18 NOTE — Patient Instructions (Signed)
[name] , Thank you for taking time to come for your Medicare Wellness Visit. I appreciate your ongoing commitment to your health goals. Please review the following plan we discussed and let me know if I can assist you in the future.   Please continue to practice social distancing to keep you, your family, and our community safe.  If you must go out, please wear a Mask and practice good handwashing.  Screening recommendations/referrals: Colonoscopy: Due 2022 Mammogram: Due 2021 Bone Density: Completed Recommended yearly ophthalmology/optometry visit for glaucoma screening and checkup Recommended yearly dental visit for hygiene and checkup  Vaccinations: Influenza vaccine: Due Fall 2020 Pneumococcal vaccine: Completed Tdap vaccine: Due 2022 Shingles vaccine: Completed Zoster   Advanced directives: Please look to discuss this at in office visit  Conditions/risks identified: Falls   Next appointment: Need to schedule a follow up with Dr Moshe Cipro   Preventive Care 65 Years and Older, Female Preventive care refers to lifestyle choices and visits with your health care provider that can promote health and wellness. What does preventive care include?  A yearly physical exam. This is also called an annual well check.  Dental exams once or twice a year.  Routine eye exams. Ask your health care provider how often you should have your eyes checked.  Personal lifestyle choices, including:  Daily care of your teeth and gums.  Regular physical activity.  Eating a healthy diet.  Avoiding tobacco and drug use.  Limiting alcohol use.  Practicing safe sex.  Taking low-dose aspirin every day.  Taking vitamin and mineral supplements as recommended by your health care provider. What happens during an annual well check? The services and screenings done by your health care provider during your annual well check will depend on your age, overall health, lifestyle risk factors, and  family history of disease. Counseling  Your health care provider may ask you questions about your:  Alcohol use.  Tobacco use.  Drug use.  Emotional well-being.  Home and relationship well-being.  Sexual activity.  Eating habits.  History of falls.  Memory and ability to understand (cognition).  Work and work Statistician.  Reproductive health. Screening  You may have the following tests or measurements:  Height, weight, and BMI.  Blood pressure.  Lipid and cholesterol levels. These may be checked every 5 years, or more frequently if you are over 66 years old.  Skin check.  Lung cancer screening. You may have this screening every year starting at age 49 if you have a 30-pack-year history of smoking and currently smoke or have quit within the past 15 years.  Fecal occult blood test (FOBT) of the stool. You may have this test every year starting at age 43.  Flexible sigmoidoscopy or colonoscopy. You may have a sigmoidoscopy every 5 years or a colonoscopy every 10 years starting at age 81.  Hepatitis C blood test.  Hepatitis B blood test.  Sexually transmitted disease (STD) testing.  Diabetes screening. This is done by checking your blood sugar (glucose) after you have not eaten for a while (fasting). You may have this done every 1-3 years.  Bone density scan. This is done to screen for osteoporosis. You may have this done starting at age 76.  Mammogram. This may be done every 1-2 years. Talk to your health care provider about how often you should have regular mammograms. Talk with your health care provider about your test results, treatment options, and if necessary, the need for more tests. Vaccines  Your  health care provider may recommend certain vaccines, such as:  Influenza vaccine. This is recommended every year.  Tetanus, diphtheria, and acellular pertussis (Tdap, Td) vaccine. You may need a Td booster every 10 years.  Zoster vaccine. You may need this  after age 40.  Pneumococcal 13-valent conjugate (PCV13) vaccine. One dose is recommended after age 22.  Pneumococcal polysaccharide (PPSV23) vaccine. One dose is recommended after age 48. Talk to your health care provider about which screenings and vaccines you need and how often you need them. This information is not intended to replace advice given to you by your health care provider. Make sure you discuss any questions you have with your health care provider. Document Released: 06/29/2015 Document Revised: 02/20/2016 Document Reviewed: 04/03/2015 Elsevier Interactive Patient Education  2017 [employer] Prevention in the Home Falls can cause injuries. They can happen to people of all ages. There are many things you can do to make your home safe and to help prevent falls. What can I do on the outside of my home?  Regularly fix the edges of walkways and driveways and fix any cracks.  Remove anything that might make you trip as you walk through a door, such as a raised step or threshold.  Trim any bushes or trees on the path to your home.  Use bright outdoor lighting.  Clear any walking paths of anything that might make someone trip, such as rocks or tools.  Regularly check to see if handrails are loose or broken. Make sure that both sides of any steps have handrails.  Any raised decks and porches should have guardrails on the edges.  Have any leaves, snow, or ice cleared regularly.  Use sand or salt on walking paths during winter.  Clean up any spills in your garage right away. This includes oil or grease spills. What can I do in the bathroom?  Use night lights.  Install grab bars by the toilet and in the tub and shower. Do not use towel bars as grab bars.  Use non-skid mats or decals in the tub or shower.  If you need to sit down in the shower, use a plastic, non-slip stool.  Keep the floor dry. Clean up any water that spills on the floor as soon as it happens.   Remove soap buildup in the tub or shower regularly.  Attach bath mats securely with double-sided non-slip rug tape.  Do not have throw rugs and other things on the floor that can make you trip. What can I do in the bedroom?  Use night lights.  Make sure that you have a light by your bed that is easy to reach.  Do not use any sheets or blankets that are too big for your bed. They should not hang down onto the floor.  Have a firm chair that has side arms. You can use this for support while you get dressed.  Do not have throw rugs and other things on the floor that can make you trip. What can I do in the kitchen?  Clean up any spills right away.  Avoid walking on wet floors.  Keep items that you use a lot in easy-to-reach places.  If you need to reach something above you, use a strong step stool that has a grab bar.  Keep electrical cords out of the way.  Do not use floor polish or wax that makes floors slippery. If you must use wax, use non-skid floor wax.  Do not  have throw rugs and other things on the floor that can make you trip. What can I do with my stairs?  Do not leave any items on the stairs.  Make sure that there are handrails on both sides of the stairs and use them. Fix handrails that are broken or loose. Make sure that handrails are as long as the stairways.  Check any carpeting to make sure that it is firmly attached to the stairs. Fix any carpet that is loose or worn.  Avoid having throw rugs at the top or bottom of the stairs. If you do have throw rugs, attach them to the floor with carpet tape.  Make sure that you have a light switch at the top of the stairs and the bottom of the stairs. If you do not have them, ask someone to add them for you. What else can I do to help prevent falls?  Wear shoes that:  Do not have high heels.  Have rubber bottoms.  Are comfortable and fit you well.  Are closed at the toe. Do not wear sandals.  If you use a  stepladder:  Make sure that it is fully opened. Do not climb a closed stepladder.  Make sure that both sides of the stepladder are locked into place.  Ask someone to hold it for you, if possible.  Clearly mark and make sure that you can see:  Any grab bars or handrails.  First and last steps.  Where the edge of each step is.  Use tools that help you move around (mobility aids) if they are needed. These include:  Canes.  Walkers.  Scooters.  Crutches.  Turn on the lights when you go into a dark area. Replace any light bulbs as soon as they burn out.  Set up your furniture so you have a clear path. Avoid moving your furniture around.  If any of your floors are uneven, fix them.  If there are any pets around you, be aware of where they are.  Review your medicines with your doctor. Some medicines can make you feel dizzy. This can increase your chance of falling. Ask your doctor what other things that you can do to help prevent falls. This information is not intended to replace advice given to you by your health care provider. Make sure you discuss any questions you have with your health care provider. Document Released: 03/29/2009 Document Revised: 11/08/2015 Document Reviewed: 07/07/2014 Elsevier Interactive Patient Education  2017 [employer].

## 2019-01-19 ENCOUNTER — Ambulatory Visit (INDEPENDENT_AMBULATORY_CARE_PROVIDER_SITE_OTHER): Payer: Medicare Other | Admitting: *Deleted

## 2019-01-19 ENCOUNTER — Other Ambulatory Visit: Payer: Self-pay

## 2019-01-19 DIAGNOSIS — I480 Paroxysmal atrial fibrillation: Secondary | ICD-10-CM | POA: Diagnosis not present

## 2019-01-19 DIAGNOSIS — Z5181 Encounter for therapeutic drug level monitoring: Secondary | ICD-10-CM | POA: Diagnosis not present

## 2019-01-19 LAB — POCT INR: INR: 4 — AB (ref 2.0–3.0)

## 2019-01-19 NOTE — Patient Instructions (Signed)
Hold coumadin tonight then resume 1 tablet daily except 1/2 tablet on Sundays and Thursdays Recheck in 3 wks

## 2019-01-29 ENCOUNTER — Other Ambulatory Visit: Payer: Self-pay | Admitting: Family Medicine

## 2019-02-09 ENCOUNTER — Ambulatory Visit (INDEPENDENT_AMBULATORY_CARE_PROVIDER_SITE_OTHER): Payer: Medicare Other | Admitting: *Deleted

## 2019-02-09 ENCOUNTER — Other Ambulatory Visit: Payer: Self-pay

## 2019-02-09 DIAGNOSIS — Z5181 Encounter for therapeutic drug level monitoring: Secondary | ICD-10-CM

## 2019-02-09 DIAGNOSIS — I480 Paroxysmal atrial fibrillation: Secondary | ICD-10-CM | POA: Diagnosis not present

## 2019-02-09 LAB — POCT INR: INR: 3.9 — AB (ref 2.0–3.0)

## 2019-02-09 NOTE — Patient Instructions (Signed)
Hold coumadin today then decrease dose to 1/2 tablet daily except 1 tablet on Mondays, Wednesdays and Fridays Recheck in 3 wks

## 2019-02-28 ENCOUNTER — Encounter: Payer: Self-pay | Admitting: Cardiology

## 2019-02-28 ENCOUNTER — Other Ambulatory Visit: Payer: Self-pay

## 2019-02-28 ENCOUNTER — Ambulatory Visit (INDEPENDENT_AMBULATORY_CARE_PROVIDER_SITE_OTHER): Payer: Medicare Other | Admitting: Cardiology

## 2019-02-28 VITALS — BP 126/86 | Temp 96.8°F | Ht 64.0 in | Wt 218.0 lb

## 2019-02-28 DIAGNOSIS — E782 Mixed hyperlipidemia: Secondary | ICD-10-CM

## 2019-02-28 DIAGNOSIS — I480 Paroxysmal atrial fibrillation: Secondary | ICD-10-CM | POA: Diagnosis not present

## 2019-02-28 DIAGNOSIS — R0789 Other chest pain: Secondary | ICD-10-CM | POA: Diagnosis not present

## 2019-02-28 DIAGNOSIS — R600 Localized edema: Secondary | ICD-10-CM

## 2019-02-28 DIAGNOSIS — I10 Essential (primary) hypertension: Secondary | ICD-10-CM

## 2019-02-28 NOTE — Patient Instructions (Signed)
Medication Instructions: Your physician recommends that you continue on your current medications as directed. Please refer to the Current Medication list given to you today.   Labwork: none  Procedures/Testing: none  Follow-Up: 6 months with Dr.Branch  Any Additional Special Instructions Will Be Listed Below (If Applicable).     If you need a refill on your cardiac medications before your next appointment, please call your pharmacy.     Thank you for choosing [facility] !        

## 2019-02-28 NOTE — Progress Notes (Signed)
Clinical Summary Ms. Knost is a 68 y.o.female seen today for follow up of the following medical problems.   1. Chest pain - history of atypical chest pain - she denies any recent symptoms.   - no recent chest pain   2. Paroxysmal afib - rate control strategy w/ diltiazem and metoprolol  - previously on xarelto however cost was too much, changed to coumadin.   - denies any palpitations - compliant with meds. No bleeding on coumadin.  - has not taken meds yet today  3. HTN  - she is compliant with meds  4. HL  - compliant with statin  10/2018 TC 127 TG 113 HDL 32 LDL 72  5. LE edema -no recent issue. Has prn lasix, takes lasix prn but ends up being daily most weeks   Past Medical History:  Diagnosis Date  . Arthritis   . Chest pain    Noncardiac; evaluated and treated in 2008  . Diabetes mellitus   . Enlarged heart   . Hyperlipidemia   . Hypertension   . Obesity   . PAF (paroxysmal atrial fibrillation) ([place])   . Right bundle branch block    +Palpitations.   EKG 11/2006:  NSR, RBBB, LAFB, markedly delayed R-wave progression, no change; Echocardiogram in 10/2006-suboptimal quality, normal; Event recorder-PVCs, no significant arrhythmias or symptoms ;     No Known Allergies   Current Outpatient Medications  Medication Sig Dispense Refill  . atorvastatin (LIPITOR) 80 MG tablet TAKE 1 TABLET BY MOUTH EVERY DAY 90 tablet 3  . benazepril (LOTENSIN) 20 MG tablet Take 1 tablet (20 mg total) by mouth daily. 90 tablet 1  . Calcium Carb-Cholecalciferol (CALCIUM 600+D) 600-800 MG-UNIT TABS Take 1 tablet by mouth daily.    Marland Kitchen diltiazem (CARDIZEM CD) 120 MG 24 hr capsule Take 1 capsule (120 mg total) by mouth daily. 90 capsule 1  . furosemide (LASIX) 20 MG tablet TAKE 1 TABLET BY MOUTH EVERY DAY AS NEEDED 90 tablet 3  . metoprolol tartrate (LOPRESSOR) 100 MG tablet TAKE 1 TABLET BY MOUTH TWICE A DAY 180 tablet 3  . Multiple Vitamins-Minerals ([facility] SPECTRAVITE  PO) Take 1 tablet by mouth daily.     Marland Kitchen warfarin (COUMADIN) 5 MG tablet Take 1 tablet daily except 1/2 tablet on Sunday and Thursday or as directed By [facility] 100 tablet 1   No current facility-administered medications for this visit.      Past Surgical History:  Procedure Laterality Date  . ABDOMINAL HYSTERECTOMY    . BREAST BIOPSY Left    benign  . CATARACT EXTRACTION W/PHACO Right 01/08/2016   Procedure: CATARACT EXTRACTION PHACO AND INTRAOCULAR LENS PLACEMENT (IOC);  Surgeon: Rutherford Guys, MD;  Location: [facility];  Service: Ophthalmology;  Laterality: Right;  CDE: 5.78  . CATARACT EXTRACTION W/PHACO Left 02/05/2016   Procedure: CATARACT EXTRACTION PHACO AND INTRAOCULAR LENS PLACEMENT LEFT EYE CDE=6.50;  Surgeon: Rutherford Guys, MD;  Location: [facility];  Service: Ophthalmology;  Laterality: Left;  . COLONOSCOPY  2006   Dr. Tamala Julian: Normal, repeat in 2009.  Marland Kitchen COLONOSCOPY N/A 10/08/2015   Procedure: COLONOSCOPY;  Surgeon: Danie Binder, MD;  Location: [facility];  Service: Endoscopy;  Laterality: N/A;  230 - moved to 1:30, spoke with pt   . POLYPECTOMY  10/08/2015   Procedure: POLYPECTOMY;  Surgeon: Danie Binder, MD;  Location: [facility];  Service: Endoscopy;;  cecal polyp removed via cold forceps with one clip placed/ rectal polyp removed  via cold forcep  . SHOULDER SURGERY  2006   Left-post-trauma  . TOTAL ABDOMINAL HYSTERECTOMY W/ BILATERAL SALPINGOOPHORECTOMY       No Known Allergies    Family History  Problem Relation Age of Onset  . Hypertension Mother   . Arthritis Mother   . Stroke Father   . Hypertension Father   . Coronary artery disease Father   . Diabetes Sister   . Sarcoidosis Sister   . Diabetes Sister   . Heart failure Brother   . Congestive Heart Failure Brother   . Colon cancer Neg Hx      Social History Ms. Wardell reports that she has never smoked. She has never used smokeless tobacco. Ms. Derflinger reports no history of alcohol use.    Review of Systems CONSTITUTIONAL: No weight loss, fever, chills, weakness or fatigue.  HEENT: Eyes: No visual loss, blurred vision, double vision or yellow sclerae.No hearing loss, sneezing, congestion, runny nose or sore throat.  SKIN: No rash or itching.  CARDIOVASCULAR: per hpi RESPIRATORY: No shortness of breath, cough or sputum.  GASTROINTESTINAL: No anorexia, nausea, vomiting or diarrhea. No abdominal pain or blood.  GENITOURINARY: No burning on urination, no polyuria NEUROLOGICAL: No headache, dizziness, syncope, paralysis, ataxia, numbness or tingling in the extremities. No change in bowel or bladder control.  MUSCULOSKELETAL: No muscle, back pain, joint pain or stiffness.  LYMPHATICS: No enlarged nodes. No history of splenectomy.  PSYCHIATRIC: No history of depression or anxiety.  ENDOCRINOLOGIC: No reports of sweating, cold or heat intolerance. No polyuria or polydipsia.  Marland Kitchen   Physical Examination Vitals:   02/28/19 1129  BP: 126/86  Temp: (!) 96.8 F (36 C)   Filed Weights   02/28/19 1129  Weight: 218 lb (98.9 kg)    Gen: resting comfortably, no acute distress HEENT: no scleral icterus, pupils equal round and reactive, no palptable cervical adenopathy,  CV: irreg, no m/r/g, no jvd Resp: Clear to auscultation bilaterally GI: abdomen is soft, non-tender, non-distended, normal bowel sounds, no hepatosplenomegaly MSK: extremities are warm, no edema.  Skin: warm, no rash Neuro:  no focal deficits Psych: appropriate affect   Diagnostic Studies 07/12/12 Echo: LVEF 55-60%, mild basal septal hypertrophy,   07/11/12 Carotid US: <50% bilateral stenosis, 3.4 cm thyroid nodule     Assessment and Plan  1. Chest pain - no recent symptoms, continue to monitor.    2. Parox afib  . CHADS2Vasc score is 3, continue anticoag. DOACs too expensive, - no symptoms, continue current meds  3. HTN:  -at goal, continue current meds  4. HL:  -lipids at goal, continue  statin  5. Leg edema - controlled, continue lasix      Arnoldo Lenis, M.D.

## 2019-03-02 ENCOUNTER — Ambulatory Visit (INDEPENDENT_AMBULATORY_CARE_PROVIDER_SITE_OTHER): Payer: Medicare Other | Admitting: *Deleted

## 2019-03-02 ENCOUNTER — Other Ambulatory Visit: Payer: Self-pay

## 2019-03-02 DIAGNOSIS — I480 Paroxysmal atrial fibrillation: Secondary | ICD-10-CM | POA: Diagnosis not present

## 2019-03-02 DIAGNOSIS — Z5181 Encounter for therapeutic drug level monitoring: Secondary | ICD-10-CM

## 2019-03-02 LAB — POCT INR: INR: 2.8 (ref 2.0–3.0)

## 2019-03-02 NOTE — Patient Instructions (Signed)
Continue warfarin 1/2 tablet daily except 1 tablet on Mondays, Wednesdays and Fridays  Recheck in 4 wks.   

## 2019-03-08 ENCOUNTER — Other Ambulatory Visit: Payer: Self-pay | Admitting: Family Medicine

## 2019-03-28 ENCOUNTER — Telehealth: Payer: Self-pay | Admitting: Pharmacist

## 2019-03-28 ENCOUNTER — Other Ambulatory Visit: Payer: Self-pay | Admitting: Family Medicine

## 2019-03-28 NOTE — Telephone Encounter (Signed)
Called pt to discuss changing from warfarin to [place] due to better efficacy and safety data, as well as less frequent monitoring, especially given COVID-19 pandemic. She has Medicaid and [employer]. 3 month supply of Eliquis 5mg  BID would cost no more than $8.93. Pt is agreeable, will plan to transition pt at upcoming INR check on 10/14.

## 2019-03-30 ENCOUNTER — Ambulatory Visit (INDEPENDENT_AMBULATORY_CARE_PROVIDER_SITE_OTHER): Payer: Medicare Other | Admitting: *Deleted

## 2019-03-30 ENCOUNTER — Other Ambulatory Visit: Payer: Self-pay

## 2019-03-30 DIAGNOSIS — I480 Paroxysmal atrial fibrillation: Secondary | ICD-10-CM

## 2019-03-30 DIAGNOSIS — Z5181 Encounter for therapeutic drug level monitoring: Secondary | ICD-10-CM

## 2019-03-30 LAB — POCT INR: INR: 2.4 (ref 2.0–3.0)

## 2019-03-30 MED ORDER — APIXABAN 5 MG PO TABS: 5.0000 mg | ORAL_TABLET | Freq: Two times a day (BID) | ORAL | 3 refills | Status: DC

## 2019-03-30 NOTE — Patient Instructions (Signed)
Here to change from warfarin to Eliquis 5mg  twice daily.  SCr 0.89 Hold warfarin tonight then discontinue.  Start Eliquis 5mg  twice daily tomorrow morning. Follow up in 1 month with BMP,CBC

## 2019-03-31 ENCOUNTER — Ambulatory Visit: Payer: Self-pay | Admitting: *Deleted

## 2019-03-31 ENCOUNTER — Telehealth: Payer: Self-pay | Admitting: *Deleted

## 2019-03-31 NOTE — Telephone Encounter (Signed)
Pt says she went to pharmacy yesterday to pick up Eliquis.  Pharmacist told her it would be $100+/3 months then $47/month.  She can not afford this.  Prefers to stay on Warfarin. Warfarin dose confirmed with pt and f/u appt made.

## 2019-03-31 NOTE — Patient Instructions (Signed)
Does not want to switch to Eliquis after getting price from Pharmacy.  Prefers to stay on Warfarin. Continue warfarin 1/2 tablet daily except 1 tablet on Mondays, Wednesdays and FridaysRecheck in 4 weeks

## 2019-03-31 NOTE — Telephone Encounter (Signed)
Patient requested a phone call from [facility].

## 2019-04-04 ENCOUNTER — Ambulatory Visit (INDEPENDENT_AMBULATORY_CARE_PROVIDER_SITE_OTHER): Payer: Medicare Other | Admitting: Family Medicine

## 2019-04-04 ENCOUNTER — Other Ambulatory Visit: Payer: Self-pay

## 2019-04-04 ENCOUNTER — Other Ambulatory Visit: Payer: Self-pay | Admitting: Family Medicine

## 2019-04-04 ENCOUNTER — Encounter: Payer: Self-pay | Admitting: Family Medicine

## 2019-04-04 VITALS — BP 128/84 | HR 83 | Temp 97.9°F | Resp 15 | Ht 64.0 in | Wt 219.0 lb

## 2019-04-04 DIAGNOSIS — Z23 Encounter for immunization: Secondary | ICD-10-CM | POA: Diagnosis not present

## 2019-04-04 DIAGNOSIS — E559 Vitamin D deficiency, unspecified: Secondary | ICD-10-CM

## 2019-04-04 DIAGNOSIS — I10 Essential (primary) hypertension: Secondary | ICD-10-CM | POA: Diagnosis not present

## 2019-04-04 DIAGNOSIS — E785 Hyperlipidemia, unspecified: Secondary | ICD-10-CM | POA: Diagnosis not present

## 2019-04-04 DIAGNOSIS — E119 Type 2 diabetes mellitus without complications: Secondary | ICD-10-CM

## 2019-04-04 DIAGNOSIS — E8881 Metabolic syndrome: Secondary | ICD-10-CM

## 2019-04-04 NOTE — Assessment & Plan Note (Signed)
Updated lab needed at/ before next visit. Samantha Vega is reminded of the importance of commitment to daily physical activity for 30 minutes or more, as able and the need to limit carbohydrate intake to 30 to 60 grams per meal to help with blood sugar control.   The need to take medication as prescribed, test blood sugar as directed, and to call between visits if there is a concern that blood sugar is uncontrolled is also discussed.   Samantha Vega is reminded of the importance of daily foot exam, annual eye examination, and good blood sugar, blood pressure and cholesterol control.  Diabetic Labs Latest Ref Rng & Units 10/18/2018 06/21/2018 02/25/2018 02/14/2018 06/15/2017  HbA1c 4.8 - 5.6 % 6.5(H) 6.6(H) 6.5(H) - 6.2(H)  Microalbumin Not Estab. ug/mL - - - - -  Micro/Creat Ratio 0.0 - 30.0 mg/g creat - - - - -  Chol 100 - 199 mg/dL 127 - 133 - 157  HDL >39 mg/dL 32(L) - 33(L) - 33(L)  Calc LDL 0 - 99 mg/dL 72 - 85 - 103(H)  Triglycerides 0 - 149 mg/dL 113 - 64 - 111  Creatinine 0.57 - 1.00 mg/dL 0.89 0.96 - 0.71 0.79   BP/Weight 04/04/2019 02/28/2019 01/18/2019 10/27/2018 06/28/2018 [id number]  Systolic BP 0000000 [id number] 123456 123456 123456 [id number]  Diastolic BP 84 86 60 60 60 64 81  Wt. (Lbs) 219 218 208 208 208 206 210  BMI 37.59 37.42 38.04 38.04 34.61 34.28 34.95   Foot/eye exam completion dates Latest Ref Rng & Units 02/25/2018 12/10/2016  Eye Exam No Retinopathy - -  Foot Form Completion - Done Done

## 2019-04-04 NOTE — Assessment & Plan Note (Signed)
Controlled, no change in medication DASH diet and commitment to daily physical activity for a minimum of 30 minutes discussed and encouraged, as a part of hypertension management. The importance of attaining a healthy weight is also discussed.  BP/Weight 04/04/2019 02/28/2019 01/18/2019 10/27/2018 06/28/2018 [id number]  Systolic BP 0000000 [id number] [id number] 123456 123456 [id number]  Diastolic BP 84 86 60 60 60 64 81  Wt. (Lbs) 219 218 208 208 208 206 210  BMI 37.59 37.42 38.04 38.04 34.61 34.28 34.95

## 2019-04-04 NOTE — Patient Instructions (Addendum)
wELLNESS IN OFFICE PAST DUE PLEASE ARRANGE  ANNUAL EXAM WITH MD IN FEB 2021, CALL IF YOU NEED ME SOONER  YOU ARE REFERRED TO PODIATRY AND OPHTHALMOLOGY. BOTH ARE OVERDUE  LABS TODAY, LIPID, CMP AND EGFR, CBC AND VIT D  It is important that you exercise regularly at least 30 minutes 5 times a week. If you develop chest pain, have severe difficulty breathing, or feel very tired, stop exercising immediately and seek medical attention  Think about what you will eat, plan ahead. Choose " clean, green, fresh or frozen" over canned, processed or packaged foods which are more sugary, salty and fatty. 70 to 75% of food eaten should be vegetables and fruit. Three meals at set times with snacks allowed between meals, but they must be fruit or vegetables. Aim to eat over a 12 hour period , example 7 am to 7 pm, and STOP after  your last meal of the day. Drink water,generally about 64 ounces per day, no other drink is as healthy. Fruit juice is best enjoyed in a healthy way, by EATING the fruit. Thanks for choosing [facility], we consider it a privelige to serve you.

## 2019-04-05 LAB — CBC WITH DIFFERENTIAL/PLATELET
Basophils Absolute: 0 10*3/uL (ref 0.0–0.2)
Basos: 1 %
EOS (ABSOLUTE): 0.1 10*3/uL (ref 0.0–0.4)
Eos: 3 %
Hematocrit: 40.8 % (ref 34.0–46.6)
Hemoglobin: 13.6 g/dL (ref 11.1–15.9)
Immature Grans (Abs): 0 10*3/uL (ref 0.0–0.1)
Immature Granulocytes: 0 %
Lymphocytes Absolute: 1.6 10*3/uL (ref 0.7–3.1)
Lymphs: 35 %
MCH: 28.3 pg (ref 26.6–33.0)
MCHC: 33.3 g/dL (ref 31.5–35.7)
MCV: 85 fL (ref 79–97)
Monocytes Absolute: 0.3 10*3/uL (ref 0.1–0.9)
Monocytes: 7 %
Neutrophils Absolute: 2.5 10*3/uL (ref 1.4–7.0)
Neutrophils: 54 %
Platelets: 199 10*3/uL (ref 150–450)
RBC: 4.8 x10E6/uL (ref 3.77–5.28)
RDW: 14.6 % (ref 11.7–15.4)
WBC: 4.6 10*3/uL (ref 3.4–10.8)

## 2019-04-05 LAB — COMPREHENSIVE METABOLIC PANEL
ALT: 42 IU/L — ABNORMAL HIGH (ref 0–32)
AST: 35 IU/L (ref 0–40)
Albumin/Globulin Ratio: 1.2 (ref 1.2–2.2)
Albumin: 3.9 g/dL (ref 3.8–4.8)
Alkaline Phosphatase: 177 IU/L — ABNORMAL HIGH (ref 39–117)
BUN/Creatinine Ratio: 17 (ref 12–28)
BUN: 14 mg/dL (ref 8–27)
Bilirubin Total: 0.4 mg/dL (ref 0.0–1.2)
CO2: 25 mmol/L (ref 20–29)
Calcium: 9.1 mg/dL (ref 8.7–10.3)
Chloride: 103 mmol/L (ref 96–106)
Creatinine, Ser: 0.84 mg/dL (ref 0.57–1.00)
GFR calc Af Amer: 81 mL/min/{1.73_m2} (ref >59)
GFR calc non Af Amer: 71 mL/min/{1.73_m2} (ref >59)
Globulin, Total: 3.2 g/dL (ref 1.5–4.5)
Glucose: 115 mg/dL — ABNORMAL HIGH (ref 65–99)
Potassium: 3.8 mmol/L (ref 3.5–5.2)
Sodium: 138 mmol/L (ref 134–144)
Total Protein: 7.1 g/dL (ref 6.0–8.5)

## 2019-04-05 LAB — LIPID PANEL W/O CHOL/HDL RATIO
Cholesterol, Total: 142 mg/dL (ref 100–199)
HDL: 34 mg/dL — ABNORMAL LOW (ref >39)
LDL Chol Calc (NIH): 90 mg/dL (ref 0–99)
Triglycerides: 97 mg/dL (ref 0–149)
VLDL Cholesterol Cal: 18 mg/dL (ref 5–40)

## 2019-04-05 LAB — VITAMIN D 25 HYDROXY (VIT D DEFICIENCY, FRACTURES): Vit D, 25-Hydroxy: 21.9 ng/mL — ABNORMAL LOW (ref 30.0–100.0)

## 2019-04-08 ENCOUNTER — Encounter: Payer: Self-pay | Admitting: Family Medicine

## 2019-04-08 NOTE — Assessment & Plan Note (Signed)
The increased risk of cardiovascular disease associated with this diagnosis, and the need to consistently work on lifestyle to change this is discussed. Following  a  heart healthy diet ,commitment to 30 minutes of exercise at least 5 days per week, as well as control of blood sugar and cholesterol , and achieving a healthy weight are all the areas to be addressed .  

## 2019-04-08 NOTE — Assessment & Plan Note (Signed)
Obesity linked with hypertension and diabetes  Patient re-educated about  the importance of commitment to a  minimum of 150 minutes of exercise per week as able.  The importance of healthy food choices with portion control discussed, as well as eating regularly and within a 12 hour window most days. The need to choose "clean , green" food 50 to 75% of the time is discussed, as well as to make water the primary drink and set a goal of 64 ounces water daily.    Weight /BMI 04/04/2019 02/28/2019 01/18/2019  WEIGHT 219 lb 218 lb 208 lb  HEIGHT 5\' 4"  5\' 4"  5\' 2"   BMI 37.59 kg/m2 37.42 kg/m2 38.04 kg/m2

## 2019-04-08 NOTE — Assessment & Plan Note (Signed)
Hyperlipidemia:Low fat diet discussed and encouraged.   Lipid Panel  Lab Results  Component Value Date   CHOL 142 04/04/2019   HDL 34 (L) 04/04/2019   LDLCALC 90 04/04/2019   TRIG 97 04/04/2019   CHOLHDL 4.0 02/25/2018   Needs to increase exercise , no med change

## 2019-04-08 NOTE — Progress Notes (Signed)
ILINE MOUSER     MRN: [id number]      DOB: 06-Oct-1950   HPI Samantha Vega is here for follow up and re-evaluation of chronic medical conditions, medication management and review of any available recent lab and radiology data.  Preventive health is updated, specifically  Cancer screening and Immunization.   Questions or concerns regarding consultations or procedures which the PT has had in the interim are  addressed. The PT denies any adverse reactions to current medications since the last visit.  There are no new concerns.  There are no specific complaints   ROS Denies recent fever or chills. Denies sinus pressure, nasal congestion, ear pain or sore throat. Denies chest congestion, productive cough or wheezing. Denies chest pains, palpitations and leg swelling Denies abdominal pain, nausea, vomiting,diarrhea or constipation.   Denies dysuria, frequency, hesitancy or incontinence. Denies joint pain, swelling and limitation in mobility. Denies headaches, seizures, numbness, or tingling. Denies depression, anxiety or insomnia. Denies skin break down or rash.   PE  BP 128/84   Pulse 83   Temp 97.9 F (36.6 C) (Temporal)   Resp 15   Ht 5\' 4"  (1.626 m)   Wt 219 lb (99.3 kg)   BMI 37.59 kg/m   Patient alert and oriented and in no cardiopulmonary distress.  HEENT: No facial asymmetry, EOMI,     Neck supple .  Chest: Clear to auscultation bilaterally.  CVS: S1, S2 no murmurs, no S3.Regular rate.  ABD: Soft non tender.   Ext: No edema  MS: Adequate ROM spine, shoulders, hips and knees.  Skin: Intact, no ulcerations or rash noted.  Psych: Good eye contact, normal affect. Memory intact not anxious or depressed appearing.  CNS: CN 2-12 intact, power,  normal throughout.no focal deficits noted.   Assessment & Plan  Essential hypertension Controlled, no change in medication DASH diet and commitment to daily physical activity for a minimum of 30 minutes discussed and  encouraged, as a part of hypertension management. The importance of attaining a healthy weight is also discussed.  BP/Weight 04/04/2019 02/28/2019 01/18/2019 10/27/2018 06/28/2018 [id number]  Systolic BP 0000000 [id number] 123456 123456 123456 [id number]  Diastolic BP 84 86 60 60 60 64 81  Wt. (Lbs) 219 218 208 208 208 206 210  BMI 37.59 37.42 38.04 38.04 34.61 34.28 34.95       Diabetes mellitus type 2, diet-controlled ([place]) Updated lab needed at/ before next visit. Samantha Vega is reminded of the importance of commitment to daily physical activity for 30 minutes or more, as able and the need to limit carbohydrate intake to 30 to 60 grams per meal to help with blood sugar control.   The need to take medication as prescribed, test blood sugar as directed, and to call between visits if there is a concern that blood sugar is uncontrolled is also discussed.   Samantha Vega is reminded of the importance of daily foot exam, annual eye examination, and good blood sugar, blood pressure and cholesterol control.  Diabetic Labs Latest Ref Rng & Units 10/18/2018 06/21/2018 02/25/2018 02/14/2018 06/15/2017  HbA1c 4.8 - 5.6 % 6.5(H) 6.6(H) 6.5(H) - 6.2(H)  Microalbumin Not Estab. ug/mL - - - - -  Micro/Creat Ratio 0.0 - 30.0 mg/g creat - - - - -  Chol 100 - 199 mg/dL 127 - 133 - 157  HDL >39 mg/dL 32(L) - 33(L) - 33(L)  Calc LDL 0 - 99 mg/dL 72 - 85 - 103(H)  Triglycerides 0 -  149 mg/dL 113 - 64 - 111  Creatinine 0.57 - 1.00 mg/dL 0.89 0.96 - 0.71 0.79   BP/Weight 04/04/2019 02/28/2019 01/18/2019 10/27/2018 06/28/2018 [id number]  Systolic BP 0000000 [id number] 123456 123456 123456 [id number]  Diastolic BP 84 86 60 60 60 64 81  Wt. (Lbs) 219 218 208 208 208 206 210  BMI 37.59 37.42 38.04 38.04 34.61 34.28 34.95   Foot/eye exam completion dates Latest Ref Rng & Units 02/25/2018 12/10/2016  Eye Exam No Retinopathy - -  Foot Form Completion - Done Done        Hyperlipidemia LDL goal <100 Hyperlipidemia:Low fat diet discussed and  encouraged.   Lipid Panel  Lab Results  Component Value Date   CHOL 142 04/04/2019   HDL 34 (L) 04/04/2019   LDLCALC 90 04/04/2019   TRIG 97 04/04/2019   CHOLHDL 4.0 02/25/2018   Needs to increase exercise , no med change    Metabolic syndrome X The increased risk of cardiovascular disease associated with this diagnosis, and the need to consistently work on lifestyle to change this is discussed. Following  a  heart healthy diet ,commitment to 30 minutes of exercise at least 5 days per week, as well as control of blood sugar and cholesterol , and achieving a healthy weight are all the areas to be addressed .    Morbid obesity ([place]) Obesity linked with hypertension and diabetes  Patient re-educated about  the importance of commitment to a  minimum of 150 minutes of exercise per week as able.  The importance of healthy food choices with portion control discussed, as well as eating regularly and within a 12 hour window most days. The need to choose "clean , green" food 50 to 75% of the time is discussed, as well as to make water the primary drink and set a goal of 64 ounces water daily.    Weight /BMI 04/04/2019 02/28/2019 01/18/2019  WEIGHT 219 lb 218 lb 208 lb  HEIGHT 5\' 4"  5\' 4"  5\' 2"   BMI 37.59 kg/m2 37.42 kg/m2 38.04 kg/m2

## 2019-04-11 ENCOUNTER — Telehealth: Payer: Self-pay | Admitting: Pharmacist

## 2019-04-11 MED ORDER — APIXABAN 5 MG PO TABS: 5.0000 mg | ORAL_TABLET | Freq: Two times a day (BID) | ORAL | 1 refills | Status: DC

## 2019-04-11 NOTE — Telephone Encounter (Signed)
Called patient's pharmacy to follow up with her anticoagulation - pt was to change from warfarin to Eliquis recently and has dual Medicare and Medicaid insurance - her copay was quoted at $47 per month instead of $8.95. Called patient's insurance, they did not have her Medicaid card on file.  Pharmacy stated that rx would only go through Medicare D plan as primary insurance and that Medicaid was rejecting as a secondary insurance. Called [facility] [employer] who confirmed that they will not pick up any copay if the rx is already being billed through a Part D plan. Pt would need to change her Part D plan to the Dual Complete (patients who are eligible for both Medicaid and Medicaid) to get cheaper pricing. Advised pt to look into this plan for next year to see if it is an option.

## 2019-04-20 ENCOUNTER — Ambulatory Visit: Payer: Medicare Other | Admitting: Family Medicine

## 2019-04-26 ENCOUNTER — Telehealth: Payer: Self-pay | Admitting: *Deleted

## 2019-04-26 NOTE — Telephone Encounter (Addendum)
Pt's husband, Gwyndolyn Saxon states he missed a call from this phone number. I informed Gwyndolyn Saxon of pt's appt 04/29/2019 at 9:00am, and transferred Gwyndolyn Saxon to scheduler to discuss the appt again.

## 2019-04-27 ENCOUNTER — Telehealth: Payer: Self-pay

## 2019-04-27 NOTE — Telephone Encounter (Signed)
Please advise 

## 2019-04-27 NOTE — Telephone Encounter (Signed)
Advised patient of providers plan. Wrote down information for patient and left with front staff for patient to pick up.

## 2019-04-27 NOTE — Telephone Encounter (Signed)
Pt stopped by and picked up the address for the [place], but also wanted to know what Vit she should be taken?

## 2019-04-27 NOTE — Telephone Encounter (Signed)
Her vitamin d level is low, I recommend OTC once daily vit D3 1000IU, pls let her know

## 2019-04-28 ENCOUNTER — Other Ambulatory Visit: Payer: Self-pay | Admitting: Cardiology

## 2019-04-29 ENCOUNTER — Other Ambulatory Visit: Payer: Self-pay

## 2019-04-29 ENCOUNTER — Encounter: Payer: Self-pay | Admitting: Podiatry

## 2019-04-29 ENCOUNTER — Ambulatory Visit (INDEPENDENT_AMBULATORY_CARE_PROVIDER_SITE_OTHER): Payer: Medicare Other | Admitting: Podiatry

## 2019-04-29 DIAGNOSIS — E119 Type 2 diabetes mellitus without complications: Secondary | ICD-10-CM

## 2019-04-29 DIAGNOSIS — M79675 Pain in left toe(s): Secondary | ICD-10-CM | POA: Diagnosis not present

## 2019-04-29 DIAGNOSIS — B351 Tinea unguium: Secondary | ICD-10-CM

## 2019-04-29 DIAGNOSIS — M79674 Pain in right toe(s): Secondary | ICD-10-CM | POA: Diagnosis not present

## 2019-04-29 NOTE — Progress Notes (Signed)
Subjective: Samantha Vega presents today referred by Fayrene Helper, MD for diabetic foot evaluation.  Patient relates 10 year history of diabetes.  Patient denies any history of foot wounds.  Patient denies any history of numbness, tingling, burning, pins/needles sensations.  Past Medical History:  Diagnosis Date  . Arthritis   . Chest pain    Noncardiac; evaluated and treated in 2008  . Diabetes mellitus   . Enlarged heart   . Hyperlipidemia   . Hypertension   . Obesity   . PAF (paroxysmal atrial fibrillation) ([place])   . Right bundle branch block    +Palpitations.   EKG 11/2006:  NSR, RBBB, LAFB, markedly delayed R-wave progression, no change; Echocardiogram in 10/2006-suboptimal quality, normal; Event recorder-PVCs, no significant arrhythmias or symptoms ;    Patient Active Problem List   Diagnosis Date Noted  . Diabetes mellitus type 2, diet-controlled ([place]) 02/28/2018  . Tubular adenoma of colon 10/09/2015  . Baker's cyst of knee 10/04/2013  . Metabolic syndrome X [id number]  . Chronic anticoagulation 09/16/2012  . OA (osteoarthritis) of knee 05/05/2012  . RBBB (right bundle branch block)   . Morbid obesity ([place])   . Unspecified visual loss 12/08/2008  . Essential hypertension 12/08/2008  . Hyperlipidemia LDL goal <100 12/06/2007    Past Surgical History:  Procedure Laterality Date  . ABDOMINAL HYSTERECTOMY    . BREAST BIOPSY Left    benign  . CATARACT EXTRACTION W/PHACO Right 01/08/2016   Procedure: CATARACT EXTRACTION PHACO AND INTRAOCULAR LENS PLACEMENT (IOC);  Surgeon: Rutherford Guys, MD;  Location: [facility];  Service: Ophthalmology;  Laterality: Right;  CDE: 5.78  . CATARACT EXTRACTION W/PHACO Left 02/05/2016   Procedure: CATARACT EXTRACTION PHACO AND INTRAOCULAR LENS PLACEMENT LEFT EYE CDE=6.50;  Surgeon: Rutherford Guys, MD;  Location: [facility];  Service: Ophthalmology;  Laterality: Left;  . COLONOSCOPY  2006   Dr. Tamala Julian: Normal, repeat in 2009.  Marland Kitchen COLONOSCOPY  N/A 10/08/2015   Procedure: COLONOSCOPY;  Surgeon: Danie Binder, MD;  Location: [facility];  Service: Endoscopy;  Laterality: N/A;  230 - moved to 1:30, spoke with pt   . POLYPECTOMY  10/08/2015   Procedure: POLYPECTOMY;  Surgeon: Danie Binder, MD;  Location: [facility];  Service: Endoscopy;;  cecal polyp removed via cold forceps with one clip placed/ rectal polyp removed via cold forcep  . SHOULDER SURGERY  2006   Left-post-trauma  . TOTAL ABDOMINAL HYSTERECTOMY W/ BILATERAL SALPINGOOPHORECTOMY      Current Outpatient Medications on File Prior to Visit  Medication Sig Dispense Refill  . atorvastatin (LIPITOR) 80 MG tablet TAKE 1 TABLET BY MOUTH EVERY DAY 90 tablet 3  . benazepril (LOTENSIN) 20 MG tablet TAKE 1 TABLET BY MOUTH EVERY DAY 90 tablet 1  . diltiazem (CARDIZEM CD) 120 MG 24 hr capsule TAKE 1 CAPSULE BY MOUTH EVERY DAY 90 capsule 1  . furosemide (LASIX) 20 MG tablet TAKE 1 TABLET BY MOUTH EVERY DAY AS NEEDED 90 tablet 3  . metoprolol tartrate (LOPRESSOR) 100 MG tablet TAKE 1 TABLET BY MOUTH TWICE A DAY 180 tablet 3  . warfarin (COUMADIN) 5 MG tablet Take 5 mg by mouth daily. 1 tab daily and 1/2 tab on thurs and sun     No current facility-administered medications on file prior to visit.      No Known Allergies  Social History   Occupational History  . Occupation: Museum/gallery exhibitions officer: UNEMPLOYED  Tobacco Use  . Smoking status:  Never Smoker  . Smokeless tobacco: Never Used  Substance and Sexual Activity  . Alcohol use: No  . Drug use: No  . Sexual activity: Yes    Birth control/protection: Surgical    Family History  Problem Relation Age of Onset  . Hypertension Mother   . Arthritis Mother   . Stroke Father   . Hypertension Father   . Coronary artery disease Father   . Diabetes Sister   . Sarcoidosis Sister   . Diabetes Sister   . Heart failure Brother   . Congestive Heart Failure Brother   . Colon cancer Neg Hx     Immunization History   Administered Date(s) Administered  . Fluad Quad(high Dose 65+) 04/04/2019  . Influenza Split 04/22/2011, 04/16/2012  . Influenza Whole 03/23/2007, 05/18/2009  . Influenza,inj,Quad PF,6+ Mos 04/25/2013, 05/30/2014, 06/07/2015, 02/12/2016, 04/13/2017, 02/25/2018  . Pneumococcal Conjugate-13 09/06/2014  . Pneumococcal Polysaccharide-23 10/31/2009, 02/12/2016  . Td 06/17/2003  . Tdap 04/22/2011  . Zoster 05/18/2009    Review of systems: Positive Findings in bold print.  Constitutional:  chills, fatigue, fever, sweats, weight change Communication: Optometrist, sign Ecologist, hand writing, iPad/Android device Head: headaches, head injury Eyes: changes in vision, eye pain, glaucoma, cataracts, macular degeneration, diplopia, glare,  light sensitivity, eyeglasses or contacts, blindness Ears nose mouth throat: hearing impaired, hearing aids,  ringing in ears, deaf, sign language,  vertigo, nosebleeds,  rhinitis,  cold sores, snoring, swollen glands Cardiovascular: HTN, edema, arrhythmia, pacemaker in place, defibrillator in place, chest pain/tightness, chronic anticoagulation, blood clot, heart failure, MI Peripheral Vascular: leg cramps, varicose veins, blood clots, lymphedema, varicosities Respiratory:  asthma, difficulty breathing, denies congestion, SOB, wheezing, cough, emphysema Gastrointestinal: change in appetite or weight, abdominal pain, constipation, diarrhea, nausea, vomiting, vomiting blood, change in bowel habits, abdominal pain, jaundice, rectal bleeding, hemorrhoids, GERD Genitourinary:  nocturia,  pain on urination, polyuria,  blood in urine, Foley catheter, urinary urgency, ESRD on hemodialysis Musculoskeletal: amputation, cramping, stiff joints, painful joints, decreased joint motion, fractures, OA, gout, hemiplegia, paraplegia, uses cane, wheelchair bound, uses walker, uses rollator Skin: +changes in toenails, color change, dryness, itching, mole changes,  rash,  wound(s) Neurological: headaches, numbness in feet, paresthesias in feet, burning in feet, fainting,  seizures, change in speech, migraines, memory problems/poor historian, cerebral palsy, weakness, paralysis, CVA, TIA Endocrine: diabetes, hypothyroidism, hyperthyroidism,  goiter, dry mouth, flushing, heat intolerance, cold intolerance,  excessive thirst, denies polyuria,  nocturia Hematological:  easy bleeding, excessive bleeding, easy bruising, enlarged lymph nodes, on long term blood thinner, history of past transusions Allergy/immunological:  hives, eczema, frequent infections, multiple drug allergies, seasonal allergies, transplant recipient, multiple food allergies Psychiatric:  anxiety, depression, mood disorder, suicidal ideations, hallucinations, insomnia  Objective: There were no vitals filed for this visit. Vascular Examination: Capillary refill time < 3 secs x 10 digits.  Dorsalis pedis pulses palapble.  Posterior tibial pulses palapble.  Digital hair not present x 10 digits.  Skin temperature gradient WNL b/l.  Dermatological Examination: Skin with normal turgor, texture and tone b/l  Toenails 1-5 b/l discolored, thick, dystrophic with subungual debris and pain with palpation to nailbeds due to thickness of nails.  Musculoskeletal: Muscle strength 5/5 to all LE muscle groups.  Neurological: Sensation intact with 10 gram monofilament.  Vibratory sensation intact.  Assessment: 1. NIDDM 2. Encounter for diabetic foot examination  Plan: 1. Discussed diabetic foot care principles. Literature dispensed on today. 2. Patient to continue soft, supportive shoe gear daily. 3. Patient to report any pedal injuries to  medical professional immediately. 4. Follow up one year. 5. Patient/POA to call should there be a concern in the interim.    Onychomycosis with pain  -Nails palliatively debrided as below. -Educated on self-care  Procedure: Nail Debridement Rationale:  pain  Type of Debridement: manual, sharp debridement. Instrumentation: Nail nipper, rotary burr. Number of Nails: 10  Procedures and Treatment: Consent by patient was obtained for treatment procedures. The patient understood the discussion of treatment and procedures well. All questions were answered thoroughly reviewed. Debridement of mycotic and hypertrophic toenails, 1 through 5 bilateral and clearing of subungual debris. No ulceration, no infection noted.  Return Visit-Office Procedure: Patient instructed to return to the office for a follow up visit 3 months for continued evaluation and treatment.  Boneta Lucks, DPM  No follow-ups on file.

## 2019-05-04 ENCOUNTER — Ambulatory Visit (INDEPENDENT_AMBULATORY_CARE_PROVIDER_SITE_OTHER): Payer: Medicare Other | Admitting: *Deleted

## 2019-05-04 ENCOUNTER — Other Ambulatory Visit: Payer: Self-pay

## 2019-05-04 DIAGNOSIS — Z5181 Encounter for therapeutic drug level monitoring: Secondary | ICD-10-CM

## 2019-05-04 DIAGNOSIS — I480 Paroxysmal atrial fibrillation: Secondary | ICD-10-CM

## 2019-05-04 LAB — POCT INR: INR: 2.8 (ref 2.0–3.0)

## 2019-05-04 NOTE — Patient Instructions (Signed)
Continue warfarin 1/2 tablet daily except 1 tablet on Mondays, Wednesdays and Fridays Recheck in 4 weeks 

## 2019-06-01 ENCOUNTER — Ambulatory Visit (INDEPENDENT_AMBULATORY_CARE_PROVIDER_SITE_OTHER): Payer: Medicare Other | Admitting: *Deleted

## 2019-06-01 ENCOUNTER — Other Ambulatory Visit: Payer: Self-pay

## 2019-06-01 DIAGNOSIS — Z5181 Encounter for therapeutic drug level monitoring: Secondary | ICD-10-CM

## 2019-06-01 DIAGNOSIS — I480 Paroxysmal atrial fibrillation: Secondary | ICD-10-CM | POA: Diagnosis not present

## 2019-06-01 LAB — POCT INR: INR: 2.6 (ref 2.0–3.0)

## 2019-06-01 NOTE — Patient Instructions (Signed)
Continue warfarin 1/2 tablet daily except 1 tablet on Mondays, Wednesdays and Fridays Recheck in 6 weeks 

## 2019-06-02 ENCOUNTER — Emergency Department (HOSPITAL_COMMUNITY): Payer: Medicare Other

## 2019-06-02 ENCOUNTER — Other Ambulatory Visit (HOSPITAL_COMMUNITY): Payer: Medicare Other

## 2019-06-02 ENCOUNTER — Encounter (HOSPITAL_COMMUNITY): Payer: Self-pay

## 2019-06-02 ENCOUNTER — Observation Stay (HOSPITAL_COMMUNITY)
Admission: EM | Admit: 2019-06-02 | Discharge: 2019-06-03 | Disposition: A | Discharge status: Home / Self Care | Payer: Medicare Other | Attending: Emergency Medicine | Admitting: Emergency Medicine

## 2019-06-02 ENCOUNTER — Other Ambulatory Visit (HOSPITAL_COMMUNITY): Payer: Self-pay | Admitting: Radiology

## 2019-06-02 ENCOUNTER — Other Ambulatory Visit: Payer: Self-pay

## 2019-06-02 DIAGNOSIS — R531 Weakness: Secondary | ICD-10-CM

## 2019-06-02 DIAGNOSIS — Z20828 Contact with and (suspected) exposure to other viral communicable diseases: Secondary | ICD-10-CM | POA: Diagnosis not present

## 2019-06-02 DIAGNOSIS — Z7901 Long term (current) use of anticoagulants: Secondary | ICD-10-CM | POA: Diagnosis not present

## 2019-06-02 DIAGNOSIS — R29898 Other symptoms and signs involving the musculoskeletal system: Secondary | ICD-10-CM | POA: Diagnosis not present

## 2019-06-02 DIAGNOSIS — R2681 Unsteadiness on feet: Secondary | ICD-10-CM | POA: Diagnosis not present

## 2019-06-02 DIAGNOSIS — R29810 Facial weakness: Secondary | ICD-10-CM | POA: Diagnosis not present

## 2019-06-02 DIAGNOSIS — E785 Hyperlipidemia, unspecified: Secondary | ICD-10-CM | POA: Insufficient documentation

## 2019-06-02 DIAGNOSIS — G459 Transient cerebral ischemic attack, unspecified: Secondary | ICD-10-CM

## 2019-06-02 DIAGNOSIS — Z79899 Other long term (current) drug therapy: Secondary | ICD-10-CM | POA: Diagnosis not present

## 2019-06-02 DIAGNOSIS — R404 Transient alteration of awareness: Secondary | ICD-10-CM | POA: Diagnosis not present

## 2019-06-02 DIAGNOSIS — R0902 Hypoxemia: Secondary | ICD-10-CM | POA: Diagnosis not present

## 2019-06-02 DIAGNOSIS — R29818 Other symptoms and signs involving the nervous system: Secondary | ICD-10-CM | POA: Diagnosis not present

## 2019-06-02 DIAGNOSIS — R2689 Other abnormalities of gait and mobility: Secondary | ICD-10-CM | POA: Insufficient documentation

## 2019-06-02 DIAGNOSIS — E119 Type 2 diabetes mellitus without complications: Secondary | ICD-10-CM | POA: Diagnosis not present

## 2019-06-02 DIAGNOSIS — M6281 Muscle weakness (generalized): Secondary | ICD-10-CM | POA: Insufficient documentation

## 2019-06-02 DIAGNOSIS — Z743 Need for continuous supervision: Secondary | ICD-10-CM | POA: Diagnosis not present

## 2019-06-02 DIAGNOSIS — I6522 Occlusion and stenosis of left carotid artery: Secondary | ICD-10-CM | POA: Diagnosis not present

## 2019-06-02 DIAGNOSIS — R410 Disorientation, unspecified: Secondary | ICD-10-CM | POA: Diagnosis not present

## 2019-06-02 DIAGNOSIS — I10 Essential (primary) hypertension: Secondary | ICD-10-CM | POA: Diagnosis not present

## 2019-06-02 HISTORY — DX: Transient cerebral ischemic attack, unspecified: G45.9

## 2019-06-02 LAB — I-STAT CHEM 8, ED
BUN: 17 mg/dL (ref 8–23)
Calcium, Ion: 1.08 mmol/L — ABNORMAL LOW (ref 1.15–1.40)
Chloride: 106 mmol/L (ref 98–111)
Creatinine, Ser: 1 mg/dL (ref 0.44–1.00)
Glucose, Bld: 114 mg/dL — ABNORMAL HIGH (ref 70–99)
HCT: 46 % (ref 36.0–46.0)
Hemoglobin: 15.6 g/dL — ABNORMAL HIGH (ref 12.0–15.0)
Potassium: 4.3 mmol/L (ref 3.5–5.1)
Sodium: 139 mmol/L (ref 135–145)
TCO2: 26 mmol/L (ref 22–32)

## 2019-06-02 LAB — COMPREHENSIVE METABOLIC PANEL
ALT: 65 U/L — ABNORMAL HIGH (ref 0–44)
AST: 40 U/L (ref 15–41)
Albumin: 3.8 g/dL (ref 3.5–5.0)
Alkaline Phosphatase: 156 U/L — ABNORMAL HIGH (ref 38–126)
Anion gap: 7 (ref 5–15)
BUN: 14 mg/dL (ref 8–23)
CO2: 24 mmol/L (ref 22–32)
Calcium: 8.8 mg/dL — ABNORMAL LOW (ref 8.9–10.3)
Chloride: 102 mmol/L (ref 98–111)
Creatinine, Ser: 0.94 mg/dL (ref 0.44–1.00)
GFR calc Af Amer: >60 mL/min (ref >60)
GFR calc non Af Amer: >60 mL/min (ref >60)
Glucose, Bld: 117 mg/dL — ABNORMAL HIGH (ref 70–99)
Potassium: 3.5 mmol/L (ref 3.5–5.1)
Sodium: 133 mmol/L — ABNORMAL LOW (ref 135–145)
Total Bilirubin: 0.5 mg/dL (ref 0.3–1.2)
Total Protein: 8.2 g/dL — ABNORMAL HIGH (ref 6.5–8.1)

## 2019-06-02 LAB — CBC
HCT: 43.9 % (ref 36.0–46.0)
Hemoglobin: 14.5 g/dL (ref 12.0–15.0)
MCH: 29.5 pg (ref 26.0–34.0)
MCHC: 33 g/dL (ref 30.0–36.0)
MCV: 89.4 fL (ref 80.0–100.0)
Platelets: 190 10*3/uL (ref 150–400)
RBC: 4.91 MIL/uL (ref 3.87–5.11)
RDW: 15 % (ref 11.5–15.5)
WBC: 5.5 10*3/uL (ref 4.0–10.5)
nRBC: 0 % (ref 0.0–0.2)

## 2019-06-02 LAB — DIFFERENTIAL
Abs Immature Granulocytes: 0.01 10*3/uL (ref 0.00–0.07)
Basophils Absolute: 0 10*3/uL (ref 0.0–0.1)
Basophils Relative: 1 %
Eosinophils Absolute: 0.1 10*3/uL (ref 0.0–0.5)
Eosinophils Relative: 2 %
Immature Granulocytes: 0 %
Lymphocytes Relative: 36 %
Lymphs Abs: 2 10*3/uL (ref 0.7–4.0)
Monocytes Absolute: 0.5 10*3/uL (ref 0.1–1.0)
Monocytes Relative: 9 %
Neutro Abs: 2.9 10*3/uL (ref 1.7–7.7)
Neutrophils Relative %: 52 %

## 2019-06-02 LAB — GLUCOSE, CAPILLARY: Glucose-Capillary: 106 mg/dL — ABNORMAL HIGH (ref 70–99)

## 2019-06-02 LAB — PROTIME-INR
INR: 2.4 — ABNORMAL HIGH (ref 0.8–1.2)
Prothrombin Time: 25.8 seconds — ABNORMAL HIGH (ref 11.4–15.2)

## 2019-06-02 LAB — APTT: aPTT: 38 seconds — ABNORMAL HIGH (ref 24–36)

## 2019-06-02 LAB — ETHANOL: Alcohol, Ethyl (B): <10 mg/dL (ref <10)

## 2019-06-02 MED ORDER — STROKE: EARLY STAGES OF RECOVERY BOOK: Freq: Once | Status: AC

## 2019-06-02 MED ORDER — METOPROLOL TARTRATE 50 MG PO TABS
100.0000 mg | ORAL_TABLET | Freq: Two times a day (BID) | ORAL | Status: DC
  Administered 2019-06-02 – 2019-06-03 (×2): 100 mg via ORAL
  Filled 2019-06-02 (×2): qty 2

## 2019-06-02 MED ORDER — ATORVASTATIN CALCIUM 40 MG PO TABS
80.0000 mg | ORAL_TABLET | Freq: Every day | ORAL | Status: DC
  Administered 2019-06-02 – 2019-06-03 (×2): 80 mg via ORAL
  Filled 2019-06-02 (×2): qty 2

## 2019-06-02 MED ORDER — DILTIAZEM HCL ER COATED BEADS 120 MG PO CP24
120.0000 mg | ORAL_CAPSULE | Freq: Every day | ORAL | Status: DC
  Administered 2019-06-03: 120 mg via ORAL
  Filled 2019-06-02: qty 1

## 2019-06-02 MED ORDER — ACETAMINOPHEN 325 MG PO TABS: 650.0000 mg | ORAL_TABLET | Freq: Four times a day (QID) | ORAL | Status: DC | PRN

## 2019-06-02 MED ORDER — SODIUM CHLORIDE 0.9% FLUSH
3.0000 mL | Freq: Two times a day (BID) | INTRAVENOUS | Status: DC
  Administered 2019-06-02 – 2019-06-03 (×2): 3 mL via INTRAVENOUS

## 2019-06-02 MED ORDER — INSULIN ASPART 100 UNIT/ML ~~LOC~~ SOLN: 0.0000 [IU] | Freq: Three times a day (TID) | SUBCUTANEOUS | Status: DC

## 2019-06-02 MED ORDER — IOHEXOL 350 MG/ML SOLN
100.0000 mL | Freq: Once | INTRAVENOUS | Status: AC | PRN
  Administered 2019-06-02: 100 mL via INTRAVENOUS

## 2019-06-02 MED ORDER — ACETAMINOPHEN 650 MG RE SUPP: 650.0000 mg | Freq: Four times a day (QID) | RECTAL | Status: DC | PRN

## 2019-06-02 MED ORDER — ASPIRIN 81 MG PO CHEW
81.0000 mg | CHEWABLE_TABLET | Freq: Once | ORAL | Status: AC
  Administered 2019-06-02: 81 mg via ORAL
  Filled 2019-06-02: qty 1

## 2019-06-02 NOTE — Progress Notes (Signed)
CODE STROKE CT TIMES 1408 CALL TIME 1405 BEEPER TIME 1416 EXAM STARTED 1418 EXAM FINISHED 1418 IMAGES SENT TO SOC 1421 EXAM COMPLETED IN EPIC [facility] CALLED

## 2019-06-02 NOTE — ED Provider Notes (Signed)
[facility] EMERGENCY DEPARTMENT Provider Note   CSN: [id number] Arrival date & time: 06/02/19  1412  An emergency department physician performed an initial assessment on this suspected stroke patient at 1413.  History Chief Complaint  Patient presents with  . Code Stroke    Samantha Vega is a 68 y.o. female with history of A.fib on coumadin, DM, HTN, HLD who presents as a code stroke. The patient is unable to tell me what happened because she doesn't remember. Her husband is at bedside and states he did not witness the event. The patient remembers this morning. She went to her sisters house and after that she can't remember what happened. I called her sister, Samantha Vega who states that they were cutting potatoes at the table when all of a sudden she just stopped talking and "spaced out". She did not lose consciousness or have any seizure like activity. She was able to tell them she needed the bathroom and she stood up and walked but she was "staggering" and so her sister called 911. EMS noted that she had some left sided facial droop and her left arm was weak. Upon arrival to the ED nursing noted her left arm kept falling of the stretcher and she had left upper limb ataxia. Since then she has returned to baseline. Her sister notes that she has had a similar episode in the past. She has A.fib and is compliant with her Coumadin. The patient denies headache, dizziness, vision changes, arm or leg weakness. She reports amnesia to the event.   Samantha Vega [id number]   HPI   Past Medical History:  Diagnosis Date  . Arthritis   . Chest pain    Noncardiac; evaluated and treated in 2008  . Diabetes mellitus   . Enlarged heart   . Hyperlipidemia   . Hypertension   . Obesity   . PAF (paroxysmal atrial fibrillation) ([place])   . Right bundle branch block    +Palpitations.   EKG 11/2006:  NSR, RBBB, LAFB, markedly delayed R-wave progression, no change; Echocardiogram in 10/2006-suboptimal quality, normal;  Event recorder-PVCs, no significant arrhythmias or symptoms ;    Patient Active Problem List   Diagnosis Date Noted  . Diabetes mellitus type 2, diet-controlled ([place]) 02/28/2018  . Tubular adenoma of colon 10/09/2015  . Baker's cyst of knee 10/04/2013  . Metabolic syndrome X [id number]  . Chronic anticoagulation 09/16/2012  . OA (osteoarthritis) of knee 05/05/2012  . RBBB (right bundle branch block)   . Morbid obesity ([place])   . Unspecified visual loss 12/08/2008  . Essential hypertension 12/08/2008  . Hyperlipidemia LDL goal <100 12/06/2007    Past Surgical History:  Procedure Laterality Date  . ABDOMINAL HYSTERECTOMY    . BREAST BIOPSY Left    benign  . CATARACT EXTRACTION W/PHACO Right 01/08/2016   Procedure: CATARACT EXTRACTION PHACO AND INTRAOCULAR LENS PLACEMENT (IOC);  Surgeon: Rutherford Guys, MD;  Location: [facility];  Service: Ophthalmology;  Laterality: Right;  CDE: 5.78  . CATARACT EXTRACTION W/PHACO Left 02/05/2016   Procedure: CATARACT EXTRACTION PHACO AND INTRAOCULAR LENS PLACEMENT LEFT EYE CDE=6.50;  Surgeon: Rutherford Guys, MD;  Location: [facility];  Service: Ophthalmology;  Laterality: Left;  . COLONOSCOPY  2006   Dr. Tamala Julian: Normal, repeat in 2009.  Marland Kitchen COLONOSCOPY N/A 10/08/2015   Procedure: COLONOSCOPY;  Surgeon: Danie Binder, MD;  Location: [facility];  Service: Endoscopy;  Laterality: N/A;  230 - moved to 1:30, spoke with pt   . POLYPECTOMY  10/08/2015  Procedure: POLYPECTOMY;  Surgeon: Danie Binder, MD;  Location: [facility];  Service: Endoscopy;;  cecal polyp removed via cold forceps with one clip placed/ rectal polyp removed via cold forcep  . SHOULDER SURGERY  2006   Left-post-trauma  . TOTAL ABDOMINAL HYSTERECTOMY W/ BILATERAL SALPINGOOPHORECTOMY       OB History   No obstetric history on file.     Family History  Problem Relation Age of Onset  . Hypertension Mother   . Arthritis Mother   . Stroke Father   . Hypertension Father   . Coronary artery  disease Father   . Diabetes Sister   . Sarcoidosis Sister   . Diabetes Sister   . Heart failure Brother   . Congestive Heart Failure Brother   . Colon cancer Neg Hx     Social History   Tobacco Use  . Smoking status: Never Smoker  . Smokeless tobacco: Never Used  Substance Use Topics  . Alcohol use: No  . Drug use: No    Home Medications Prior to Admission medications   Medication Sig Start Date End Date Taking? Authorizing Provider  atorvastatin (LIPITOR) 80 MG tablet TAKE 1 TABLET BY MOUTH EVERY DAY 04/28/19   Arnoldo Lenis, MD  benazepril (LOTENSIN) 20 MG tablet TAKE 1 TABLET BY MOUTH EVERY DAY 03/08/19   Fayrene Helper, MD  diltiazem (CARDIZEM CD) 120 MG 24 hr capsule TAKE 1 CAPSULE BY MOUTH EVERY DAY 03/08/19   Fayrene Helper, MD  furosemide (LASIX) 20 MG tablet TAKE 1 TABLET BY MOUTH EVERY DAY AS NEEDED 10/06/18   Arnoldo Lenis, MD  metoprolol tartrate (LOPRESSOR) 100 MG tablet TAKE 1 TABLET BY MOUTH TWICE A DAY 04/15/18   Arnoldo Lenis, MD  warfarin (COUMADIN) 5 MG tablet Take 5 mg by mouth daily. 1 tab daily and 1/2 tab on thurs and sun    [provider]    Allergies    Patient has no known allergies.  Review of Systems   Review of Systems  Constitutional: Negative for chills and fever.  Respiratory: Negative for shortness of breath.   Cardiovascular: Negative for chest pain.  Neurological: Negative for dizziness, syncope, weakness, numbness and headaches.  Psychiatric/Behavioral: Positive for confusion.  All other systems reviewed and are negative.   Physical Exam Updated Vital Signs BP (!) 161/99 (BP Location: Right Arm)   Pulse 84   Temp 97.6 F (36.4 C) (Oral)   Resp 10   SpO2 96%   Physical Exam Vitals and nursing note reviewed.  Constitutional:      General: She is not in acute distress.    Appearance: Normal appearance. She is well-developed. She is not ill-appearing.  HENT:     Head: Normocephalic and  atraumatic.  Eyes:     General: No scleral icterus.       Right eye: No discharge.        Left eye: No discharge.     Conjunctiva/sclera: Conjunctivae normal.     Pupils: Pupils are equal, round, and reactive to light.  Cardiovascular:     Rate and Rhythm: Normal rate.  Pulmonary:     Effort: Pulmonary effort is normal. No respiratory distress.  Abdominal:     General: There is no distension.  Musculoskeletal:     Cervical back: Normal range of motion.  Skin:    General: Skin is warm and dry.  Neurological:     Mental Status: She is alert and oriented to  person, place, and time.     Comments: Mental Status:  Alert, oriented, thought content appropriate, able to give a coherent history. Speech fluent without evidence of aphasia. Able to follow 2 step commands without difficulty.  Cranial Nerves:  II:  Peripheral visual fields grossly normal, pupils equal, round, reactive to light III,IV, VI: ptosis not present, extra-ocular motions intact bilaterally  V,VII: smile symmetric, facial light touch sensation equal VIII: hearing grossly normal to voice  X: uvula elevates symmetrically  XI: bilateral shoulder shrug symmetric and strong XII: midline tongue extension without fassiculations Motor:  Normal tone. 5/5 in upper and lower extremities bilaterally including strong and equal grip strength and dorsiflexion/plantar flexion Sensory: Pinprick and light touch normal in all extremities.  Cerebellar: normal finger-to-nose with bilateral upper extremities Gait: not tested CV: distal pulses palpable throughout    Psychiatric:        Behavior: Behavior normal.     ED Results / Procedures / Treatments   Labs (all labs ordered are listed, but only abnormal results are displayed) Labs Reviewed  PROTIME-INR - Abnormal; Notable for the following components:      Result Value   Prothrombin Time 25.8 (*)    INR 2.4 (*)    All other components within normal limits  APTT - Abnormal;  Notable for the following components:   aPTT 38 (*)    All other components within normal limits  COMPREHENSIVE METABOLIC PANEL - Abnormal; Notable for the following components:   Sodium 133 (*)    Glucose, Bld 117 (*)    Calcium 8.8 (*)    Total Protein 8.2 (*)    ALT 65 (*)    Alkaline Phosphatase 156 (*)    All other components within normal limits  I-STAT CHEM 8, ED - Abnormal; Notable for the following components:   Glucose, Bld 114 (*)    Calcium, Ion 1.08 (*)    Hemoglobin 15.6 (*)    All other components within normal limits  ETHANOL  CBC  DIFFERENTIAL  RAPID URINE DRUG SCREEN, HOSP PERFORMED  URINALYSIS, ROUTINE W REFLEX MICROSCOPIC    EKG None  Radiology CT Angio Head W or Wo Contrast  Result Date: 06/02/2019 CLINICAL DATA:  Left-sided weakness. Rule out stroke. EXAM: CT ANGIOGRAPHY HEAD AND NECK TECHNIQUE: Multidetector CT imaging of the head and neck was performed using the standard protocol during bolus administration of intravenous contrast. Multiplanar CT image reconstructions and MIPs were obtained to evaluate the vascular anatomy. Carotid stenosis measurements (when applicable) are obtained utilizing NASCET criteria, using the distal internal carotid diameter as the denominator. CONTRAST:  187mL OMNIPAQUE IOHEXOL 350 MG/ML SOLN COMPARISON:  CT head 06/02/2019 FINDINGS: CTA NECK FINDINGS Aortic arch: Standard branching. Imaged portion shows no evidence of aneurysm or dissection. No significant stenosis of the major arch vessel origins. Mild atherosclerotic calcification aortic arch. Right carotid system: Right carotid widely patent without stenosis or atherosclerotic disease Left carotid system: Left carotid widely patent without atherosclerotic disease or stenosis. Vertebral arteries: Both vertebral arteries widely patent without stenosis Skeleton: Cervical spine degenerative change. No acute skeletal abnormality. Other neck: Thyroid goiter. Multiple cystic and solid  nodules. Multiple calcified nodules. Solid nodule in the isthmus measuring 20 mm. Upper chest: Lung apices clear bilaterally. Review of the MIP images confirms the above findings CTA HEAD FINDINGS Anterior circulation: Mild atherosclerotic disease right cavernous carotid without stenosis. Atherosclerotic calcification left cavernous carotid with moderate stenosis. Anterior and middle cerebral arteries are patent without stenosis or large vessel  occlusion. Negative for aneurysm. Posterior circulation: Both vertebral arteries patent to the basilar. PICA patent bilaterally. Basilar patent. Superior cerebellar and posterior cerebral arteries patent bilaterally without stenosis large vessel occlusion or aneurysm. Venous sinuses: Normal venous enhancement Anatomic variants: None Review of the MIP images confirms the above findings IMPRESSION: 1. No significant carotid or vertebral artery stenosis in the neck. 2. 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 3. No significant intracranial stenosis or large vessel occlusion. 4. Thyroid goiter with multiple cystic and solid nodules. 2 cm solid nodule in the isthmus. Recommend thyroid ultrasound. (ref: J Am Coll Radiol. 2015 Feb;12(2): 143-50). Electronically Signed   By: Franchot Gallo M.D.   On: 06/02/2019 15:27   CT Angio Neck W and/or Wo Contrast  Result Date: 06/02/2019 CLINICAL DATA:  Left-sided weakness. Rule out stroke. EXAM: CT ANGIOGRAPHY HEAD AND NECK TECHNIQUE: Multidetector CT imaging of the head and neck was performed using the standard protocol during bolus administration of intravenous contrast. Multiplanar CT image reconstructions and MIPs were obtained to evaluate the vascular anatomy. Carotid stenosis measurements (when applicable) are obtained utilizing NASCET criteria, using the distal internal carotid diameter as the denominator. CONTRAST:  160mL OMNIPAQUE IOHEXOL 350 MG/ML SOLN COMPARISON:  CT head 06/02/2019 FINDINGS:  CTA NECK FINDINGS Aortic arch: Standard branching. Imaged portion shows no evidence of aneurysm or dissection. No significant stenosis of the major arch vessel origins. Mild atherosclerotic calcification aortic arch. Right carotid system: Right carotid widely patent without stenosis or atherosclerotic disease Left carotid system: Left carotid widely patent without atherosclerotic disease or stenosis. Vertebral arteries: Both vertebral arteries widely patent without stenosis Skeleton: Cervical spine degenerative change. No acute skeletal abnormality. Other neck: Thyroid goiter. Multiple cystic and solid nodules. Multiple calcified nodules. Solid nodule in the isthmus measuring 20 mm. Upper chest: Lung apices clear bilaterally. Review of the MIP images confirms the above findings CTA HEAD FINDINGS Anterior circulation: Mild atherosclerotic disease right cavernous carotid without stenosis. Atherosclerotic calcification left cavernous carotid with moderate stenosis. Anterior and middle cerebral arteries are patent without stenosis or large vessel occlusion. Negative for aneurysm. Posterior circulation: Both vertebral arteries patent to the basilar. PICA patent bilaterally. Basilar patent. Superior cerebellar and posterior cerebral arteries patent bilaterally without stenosis large vessel occlusion or aneurysm. Venous sinuses: Normal venous enhancement Anatomic variants: None Review of the MIP images confirms the above findings IMPRESSION: 1. No significant carotid or vertebral artery stenosis in the neck. 2. 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 3. No significant intracranial stenosis or large vessel occlusion. 4. Thyroid goiter with multiple cystic and solid nodules. 2 cm solid nodule in the isthmus. Recommend thyroid ultrasound. (ref: J Am Coll Radiol. 2015 Feb;12(2): 143-50). Electronically Signed   By: Franchot Gallo M.D.   On: 06/02/2019 15:27   CT HEAD CODE STROKE WO  CONTRAST  Result Date: 06/02/2019 CLINICAL DATA:  Code stroke.  Left arm weakness.  Ataxia EXAM: CT HEAD WITHOUT CONTRAST TECHNIQUE: Contiguous axial images were obtained from the base of the skull through the vertex without intravenous contrast. COMPARISON:  MRI head 07/12/2012 FINDINGS: Brain: No evidence of acute infarction, hemorrhage, hydrocephalus, extra-axial collection or mass lesion/mass effect. Vascular: Negative for hyperdense vessel. Atherosclerotic calcification in the cavernous carotid bilaterally. Skull: Negative Sinuses/Orbits: Bilateral cataract surgery. Mucoperiosteal thickening maxillary sinus bilaterally. Other: None ASPECTS ([place] Stroke Program Early CT Score) - Ganglionic level infarction (caudate, lentiform nuclei, internal capsule, insula, M1-M3 cortex): 7 - Supraganglionic infarction (M4-M6 cortex): 3 Total score (0-10  with 10 being normal): 10 IMPRESSION: 1. Negative CT brain without contrast.  No acute abnormality 2. ASPECTS is 10 3. These results were called by telephone at the time of interpretation on 06/02/2019 at 2:34 pm to provider JOSHUA LONG , who verbally acknowledged these results. Electronically Signed   By: Franchot Gallo M.D.   On: 06/02/2019 14:34    Procedures Procedures (including critical care time)  Medications Ordered in ED Medications  iohexol (OMNIPAQUE) 350 MG/ML injection 100 mL (100 mLs Intravenous Contrast Given 06/02/19 1517)    ED Course  I have reviewed the triage vital signs and the nursing notes.  Pertinent labs & imaging results that were available during my care of the patient were reviewed by me and considered in my medical decision making (see chart for details).    MDM Rules/Calculators/A&P  68 year old female with acute confusion and left sided weakness at home today who comes in as code stroke. She is hypertensive but otherwise vitals are normal. The nurses initially saw her on arrival with EMS and noted left sided weakness and  difficulty speaking. She was immediately taken to CT. I was not able to evaluate her until she came back from CT and at that time her symptoms have essentially resolved. She may have some mild residual left sided facial droop. CT head and CTA head/neck are negative.  CBC is normal. CMP is remarkable for mild hyponatremia (133), mild elevation of AP (156). INR is therapeutic. (2.4). Shared visit with Dr. Laverta Baltimore. Dr. Mike Craze with teleneurology is recommending MRI and echo to complete work up.  5:04 PM Discussed with Dr. Eugenia Pancoast who will admit for TIA work up  Final Clinical Impression(s) / ED Diagnoses Final diagnoses:  Left-sided weakness  TIA (transient ischemic attack)    Rx / DC Orders ED Discharge Orders    None       Recardo Evangelist, PA-C 06/02/19 1710    Milton Ferguson, MD 06/02/19 2320

## 2019-06-02 NOTE — H&P (Signed)
History and Physical    PLEASE NOTE THAT DRAGON DICTATION SOFTWARE WAS USED IN THE CONSTRUCTION OF THIS NOTE.   Samantha Vega [id number] DOB: 11-15-1950 DOA: 06/02/2019  PCP: Fayrene Helper, MD Patient coming from: home  I have personally briefly reviewed patient's old medical records in [place]  Chief Complaint: left upper extremity weakness  HPI: Samantha Vega is a 68 y.o. female with medical history significant for paroxysmal atrial fibrillation chronically anticoagulated on warfarin, hypertension, hyperlipidemia, type 2 diabetes mellitus managed via lifestyle modifications, who is admitted to [facility] on 06/02/2019 with suspected TIA after presenting from home to [facility] emergency department complaining of left upper extremity weakness and left facial droop.  The patient reports acute onset left upper extremity weakness associated with left facial droop, all starting at approximately 1430 on 06/02/2019 while sitting at the kitchen table visiting her sister.  Denies any associated additional acute focal weakness, including now noted acute focal weakness in the left lower extremity.  Also denies any associated paresthesias, numbness, dysphagia, dizziness, vertigo, change in vision, blurry vision, diplopia, headache, or nausea/vomiting.  Denies any previous history of stroke.  In terms of modifiable risk factors, the patient acknowledges a history of paroxysmal atrial fibrillation for which she is chronically anticoagulated on warfarin, as well as a history of hypertension and hyperlipidemia.  She states that she is compliant with her outpatient high intensity atorvastatin 80 mg p.o. daily as well as her multiple antihypertensive medications which include benazepril, Lopressor, Cardizem.  She also acknowledges a history of type 2 diabetes mellitus, but states that this has been managed successfully with lifestyle modifications and that she is not currently  on any exogenous insulin or oral hypoglycemic agents.  In terms of outpatient blood thinning agents, the patient reports that she is chronically anticoagulated on warfarin, and does not take any antiplatelet medications.  Denies any known history of obstructive sleep apnea.  Additionally, the patient confirms that she is a lifelong non-smoker.  The patient reports that shortly after presenting to [facility] emergency department, that she noted improvement in her left upper extremity weakness and left facial droop.  She reports subsequent complete resolution of these acute focal neurologic deficits, without any subsequent recurrence.   ED Course: Vital signs in the emergency department were notable for the following: Temperature max 97.6; heart rate 84-100; initial blood pressure noted to be 161/99, with most recent blood pressure noted to be 142/100; respiratory rate 15-23, and oxygen saturation 96 to 100% on room air.  Labs in the ED were notable for the following: Sodium 139, bicarbonate 24, creatinine 1.0, and glucose 114.  CBC notable for white blood cell count 5500, hemoglobin 14.5.  INR 2.4.  Noncontrast CT of the head showed no evidence of acute intracranial process, including no evidence of intracranial hemorrhage or acute ischemic infarct.  CTA of the head and neck showed no evidence of significant flow-limiting stenosis, thrombus, dissection, or aneurysm.  In the ED, the patient was evaluated by telestroke neurologist, Dr. Mike Craze, who, given interval resolution of left upper extremity weakness and left facial droop, felt the presentation was most suggestive of TIA.  He recommended ensuing admission to the hospitalist service for further evaluation and management of TIA, including pursuit of MRI of the brain, echocardiogram with bubble study, A1c, and lipid panel.     Review of Systems: As per HPI otherwise 10 point review of systems negative.   Past Medical History:  Diagnosis Date  .  Arthritis   . Chest pain    Noncardiac; evaluated and treated in 2008  . Diabetes mellitus   . Enlarged heart   . Hyperlipidemia   . Hypertension   . Obesity   . PAF (paroxysmal atrial fibrillation) ([place])   . Right bundle branch block    +Palpitations.   EKG 11/2006:  NSR, RBBB, LAFB, markedly delayed R-wave progression, no change; Echocardiogram in 10/2006-suboptimal quality, normal; Event recorder-PVCs, no significant arrhythmias or symptoms ;    Past Surgical History:  Procedure Laterality Date  . ABDOMINAL HYSTERECTOMY    . BREAST BIOPSY Left    benign  . CATARACT EXTRACTION W/PHACO Right 01/08/2016   Procedure: CATARACT EXTRACTION PHACO AND INTRAOCULAR LENS PLACEMENT (IOC);  Surgeon: Rutherford Guys, MD;  Location: [facility] ORS;  Service: Ophthalmology;  Laterality: Right;  CDE: 5.78  . CATARACT EXTRACTION W/PHACO Left 02/05/2016   Procedure: CATARACT EXTRACTION PHACO AND INTRAOCULAR LENS PLACEMENT LEFT EYE CDE=6.50;  Surgeon: Rutherford Guys, MD;  Location: [facility] OR[facility];  Service: Ophthalmology;  Laterality: Left;  . COLONOSCOPY  2006   Dr. Tamala Julian: Normal, repeat in 2009.  Marland Kitchen COLONOSCOPY N/A 10/08/2015   Procedure: COLONOSCOPY;  Surgeon: Danie Binder, MD;  Location: [facility] END[facility] SUITE;  Service: Endoscopy;  Laterality: N/A;  230 - moved to 1:30, spoke with pt   . POLYPECTOMY  10/08/2015   Procedure: POLYPECTOMY;  Surgeon: Danie Binder, MD;  Location: [facility] END[facility] SUITE;  Service: Endoscopy;;  cecal polyp removed via cold forceps with one clip placed/ rectal polyp removed via cold forcep  . SHOULDER SURGERY  2006   Left-post-trauma  . TOTAL ABDOMINAL HYSTERECTOMY W/ BILATERAL SALPINGOOPHORECTOMY      Social History:  reports that she has never smoked. She has never used smokeless tobacco. She reports that she does not drink alcohol or use drugs.   No Known Allergies  Family History  Problem Relation Age of Onset  . Hypertension Mother   . Arthritis Mother   . Stroke Father   . Hypertension Father    . Coronary artery disease Father   . Diabetes Sister   . Sarcoidosis Sister   . Diabetes Sister   . Heart failure Brother   . Congestive Heart Failure Brother   . Colon cancer Neg Hx     Prior to Admission medications   Medication Sig Start Date End Date Taking? Authorizing Provider  atorvastatin (LIPITOR) 80 MG tablet TAKE 1 TABLET BY MOUTH EVERY DAY 04/28/19   Arnoldo Lenis, MD  benazepril (LOTENSIN) 20 MG tablet TAKE 1 TABLET BY MOUTH EVERY DAY 03/08/19   Fayrene Helper, MD  diltiazem (CARDIZEM CD) 120 MG 24 hr capsule TAKE 1 CAPSULE BY MOUTH EVERY DAY 03/08/19   Fayrene Helper, MD  furosemide (LASIX) 20 MG tablet TAKE 1 TABLET BY MOUTH EVERY DAY AS NEEDED 10/06/18   Arnoldo Lenis, MD  metoprolol tartrate (LOPRESSOR) 100 MG tablet TAKE 1 TABLET BY MOUTH TWICE A DAY 04/15/18   Arnoldo Lenis, MD  warfarin (COUMADIN) 5 MG tablet Take 5 mg by mouth daily. 1 tab daily and 1/2 tab on thurs and sun    [provider]     Objective    Physical Exam: Vitals:   06/02/19 1500 06/02/19 1515 06/02/19 1530 06/02/19 1630  BP: (!) 155/104  (!) 141/96 (!) 143/96  Pulse: 100 84 97 93  Resp: (!) 25 (!) 27 (!) 31 (!) 25  Temp:      TempSrc:  SpO2: 93% 99% 98% 100%    General: appears to be stated age; alert, oriented Skin: warm, dry, no rash Head:  AT/[place] Eyes:  PEARL b/l, EOMI Mouth:  Oral mucosa membranes appear moist, normal dentition Neck: supple; trachea midline Heart:  RRR; did not appreciate any M/R/G Lungs: CTAB, did not appreciate any wheezes, rales, or rhonchi Abdomen: + BS; soft, ND, NT Vascular: 2+ pedal pulses b/l; 2+ radial pulses b/l Extremities: no peripheral edema, no muscle wasting Neuro: 5/5 strength of the proximal and distal flexors and extensors of the upper and lower extremities bilaterally; sensation intact in upper and lower extremities b/l; cranial nerves II through XII grossly intact; no pronator drift; no evidence suggestive  of slurred speech, dysarthria, or facial droop; Normal muscle tone. No tremors.     Labs on Admission: I have personally reviewed following labs and imaging studies  CBC: Recent Labs  Lab 06/02/19 1418 06/02/19 1429  WBC 5.5  --   NEUTROABS 2.9  --   HGB 14.5 15.6*  HCT 43.9 46.0  MCV 89.4  --   PLT 190  --    Basic Metabolic Panel: Recent Labs  Lab 06/02/19 1418 06/02/19 1429  NA 133* 139  K 3.5 4.3  CL 102 106  CO2 24  --   GLUCOSE 117* 114*  BUN 14 17  CREATININE 0.94 1.00  CALCIUM 8.8*  --    GFR: CrCl cannot be calculated (Unknown ideal weight.). Liver Function Tests: Recent Labs  Lab 06/02/19 1418  AST 40  ALT 65*  ALKPHOS 156*  BILITOT 0.5  PROT 8.2*  ALBUMIN 3.8   No results for input(s): LIPASE, AMYLASE in the last 168 hours. No results for input(s): AMMONIA in the last 168 hours. Coagulation Profile: Recent Labs  Lab 06/01/19 0901 06/02/19 1418  INR 2.6 2.4*   Cardiac Enzymes: No results for input(s): CKTOTAL, CKMB, CKMBINDEX, TROPONINI in the last 168 hours. BNP (last 3 results) No results for input(s): PROBNP in the last 8760 hours. HbA1C: No results for input(s): HGBA1C in the last 72 hours. CBG: No results for input(s): GLUCAP in the last 168 hours. Lipid Profile: No results for input(s): CHOL, HDL, LDLCALC, TRIG, CHOLHDL, LDLDIRECT in the last 72 hours. Thyroid Function Tests: No results for input(s): TSH, T4TOTAL, FREET4, T3FREE, THYROIDAB in the last 72 hours. Anemia Panel: No results for input(s): VITAMINB12, FOLATE, FERRITIN, TIBC, IRON, RETICCTPCT in the last 72 hours. Urine analysis:    Component Value Date/Time   COLORURINE YELLOW 05/22/2012 1155   APPEARANCEUR CLEAR 05/22/2012 1155   LABSPEC <1.005 (L) 05/22/2012 1155   PHURINE 7.0 05/22/2012 1155   GLUCOSEU NEGATIVE 05/22/2012 1155   HGBUR NEGATIVE 05/22/2012 [place] 05/22/2012 1155   BILIRUBINUR neg 05/03/2012 1117   KETONESUR NEGATIVE  05/22/2012 1155   PROTEINUR NEGATIVE 05/22/2012 1155   UROBILINOGEN 0.2 05/22/2012 1155   NITRITE NEGATIVE 05/22/2012 1155   LEUKOCYTESUR NEGATIVE 05/22/2012 1155    Radiological Exams on Admission: CT Angio Head W or Wo Contrast  Result Date: 06/02/2019 CLINICAL DATA:  Left-sided weakness. Rule out stroke. EXAM: CT ANGIOGRAPHY HEAD AND NECK TECHNIQUE: Multidetector CT imaging of the head and neck was performed using the standard protocol during bolus administration of intravenous contrast. Multiplanar CT image reconstructions and MIPs were obtained to evaluate the vascular anatomy. Carotid stenosis measurements (when applicable) are obtained utilizing NASCET criteria, using the distal internal carotid diameter as the denominator. CONTRAST:  147mL OMNIPAQUE IOHEXOL 350 MG/ML SOLN  COMPARISON:  CT head 06/02/2019 FINDINGS: CTA NECK FINDINGS Aortic arch: Standard branching. Imaged portion shows no evidence of aneurysm or dissection. No significant stenosis of the major arch vessel origins. Mild atherosclerotic calcification aortic arch. Right carotid system: Right carotid widely patent without stenosis or atherosclerotic disease Left carotid system: Left carotid widely patent without atherosclerotic disease or stenosis. Vertebral arteries: Both vertebral arteries widely patent without stenosis Skeleton: Cervical spine degenerative change. No acute skeletal abnormality. Other neck: Thyroid goiter. Multiple cystic and solid nodules. Multiple calcified nodules. Solid nodule in the isthmus measuring 20 mm. Upper chest: Lung apices clear bilaterally. Review of the MIP images confirms the above findings CTA HEAD FINDINGS Anterior circulation: Mild atherosclerotic disease right cavernous carotid without stenosis. Atherosclerotic calcification left cavernous carotid with moderate stenosis. Anterior and middle cerebral arteries are patent without stenosis or large vessel occlusion. Negative for aneurysm. Posterior  circulation: Both vertebral arteries patent to the basilar. PICA patent bilaterally. Basilar patent. Superior cerebellar and posterior cerebral arteries patent bilaterally without stenosis large vessel occlusion or aneurysm. Venous sinuses: Normal venous enhancement Anatomic variants: None Review of the MIP images confirms the above findings IMPRESSION: 1. No significant carotid or vertebral artery stenosis in the neck. 2. 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 3. No significant intracranial stenosis or large vessel occlusion. 4. Thyroid goiter with multiple cystic and solid nodules. 2 cm solid nodule in the isthmus. Recommend thyroid ultrasound. (ref: J Am Coll Radiol. 2015 Feb;12(2): 143-50). Electronically Signed   By: Franchot Gallo M.D.   On: 06/02/2019 15:27   CT Angio Neck W and/or Wo Contrast  Result Date: 06/02/2019 CLINICAL DATA:  Left-sided weakness. Rule out stroke. EXAM: CT ANGIOGRAPHY HEAD AND NECK TECHNIQUE: Multidetector CT imaging of the head and neck was performed using the standard protocol during bolus administration of intravenous contrast. Multiplanar CT image reconstructions and MIPs were obtained to evaluate the vascular anatomy. Carotid stenosis measurements (when applicable) are obtained utilizing NASCET criteria, using the distal internal carotid diameter as the denominator. CONTRAST:  181mL OMNIPAQUE IOHEXOL 350 MG/ML SOLN COMPARISON:  CT head 06/02/2019 FINDINGS: CTA NECK FINDINGS Aortic arch: Standard branching. Imaged portion shows no evidence of aneurysm or dissection. No significant stenosis of the major arch vessel origins. Mild atherosclerotic calcification aortic arch. Right carotid system: Right carotid widely patent without stenosis or atherosclerotic disease Left carotid system: Left carotid widely patent without atherosclerotic disease or stenosis. Vertebral arteries: Both vertebral arteries widely patent without stenosis Skeleton:  Cervical spine degenerative change. No acute skeletal abnormality. Other neck: Thyroid goiter. Multiple cystic and solid nodules. Multiple calcified nodules. Solid nodule in the isthmus measuring 20 mm. Upper chest: Lung apices clear bilaterally. Review of the MIP images confirms the above findings CTA HEAD FINDINGS Anterior circulation: Mild atherosclerotic disease right cavernous carotid without stenosis. Atherosclerotic calcification left cavernous carotid with moderate stenosis. Anterior and middle cerebral arteries are patent without stenosis or large vessel occlusion. Negative for aneurysm. Posterior circulation: Both vertebral arteries patent to the basilar. PICA patent bilaterally. Basilar patent. Superior cerebellar and posterior cerebral arteries patent bilaterally without stenosis large vessel occlusion or aneurysm. Venous sinuses: Normal venous enhancement Anatomic variants: None Review of the MIP images confirms the above findings IMPRESSION: 1. No significant carotid or vertebral artery stenosis in the neck. 2. 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 3. No significant intracranial stenosis or large vessel occlusion. 4. Thyroid goiter with multiple cystic and solid nodules. 2 cm solid nodule in the  isthmus. Recommend thyroid ultrasound. (ref: J Am Coll Radiol. 2015 Feb;12(2): 143-50). Electronically Signed   By: Franchot Gallo M.D.   On: 06/02/2019 15:27   CT HEAD CODE STROKE WO CONTRAST  Result Date: 06/02/2019 CLINICAL DATA:  Code stroke.  Left arm weakness.  Ataxia EXAM: CT HEAD WITHOUT CONTRAST TECHNIQUE: Contiguous axial images were obtained from the base of the skull through the vertex without intravenous contrast. COMPARISON:  MRI head 07/12/2012 FINDINGS: Brain: No evidence of acute infarction, hemorrhage, hydrocephalus, extra-axial collection or mass lesion/mass effect. Vascular: Negative for hyperdense vessel. Atherosclerotic calcification in the cavernous  carotid bilaterally. Skull: Negative Sinuses/Orbits: Bilateral cataract surgery. Mucoperiosteal thickening maxillary sinus bilaterally. Other: None ASPECTS ([place] Stroke Program Early CT Score) - Ganglionic level infarction (caudate, lentiform nuclei, internal capsule, insula, M1-M3 cortex): 7 - Supraganglionic infarction (M4-M6 cortex): 3 Total score (0-10 with 10 being normal): 10 IMPRESSION: 1. Negative CT brain without contrast.  No acute abnormality 2. ASPECTS is 10 3. These results were called by telephone at the time of interpretation on 06/02/2019 at 2:34 pm to provider JOSHUA LONG , who verbally acknowledged these results. Electronically Signed   By: Franchot Gallo M.D.   On: 06/02/2019 14:34     Assessment/Plan   Samantha Vega is a 68 y.o. female with medical history significant for paroxysmal atrial fibrillation chronically anticoagulated on warfarin, hypertension, hyperlipidemia, type 2 diabetes mellitus managed via lifestyle modifications, who is admitted to [facility] on 06/02/2019 with suspected TIA after presenting from home to [facility] emergency department complaining of left upper extremity weakness and left facial droop.   Principal Problem:   TIA (transient ischemic attack) Active Problems:   Hyperlipidemia LDL goal <100   Essential hypertension   Chronic anticoagulation   Diabetes mellitus type 2, diet-controlled ([place])   #) TIA: Suspected on the basis of acute onset of left upper extremity weakness and left facial droop at 1430 on 06/02/19, which completely resolved within 2 hours of onset, without any subsequent recurrence or additional acute focal neurologic deficits.  Of note noncontrast CT the head showed no evidence of acute intracranial process, while CTA of the head and neck showed no evidence of significant flow-limiting stenosis, thrombus, dissection, or aneurysm.   The patient possesses multiple modifiable risk factors for acute ischemic CVA  including paroxysmal atrial fibrillation for which she is chronically anticoagulated on warfarin, hypertension, hyperlipidemia, and type 2 diabetes mellitus treated via lifestyle modifications. On high intensity atorvastatin as an outpatient. Lifelong non-smoker.    In the ED, the patient was evaluated by telestroke neurologist, Dr. Mike Craze, who, given interval resolution of left upper extremity weakness and left facial droop, felt the presentation was most suggestive of TIA.  He recommended ensuing admission to the hospitalist service for further evaluation and management of TIA, including pursuit of MRI of the brain, echocardiogram with bubble study, A1c, and lipid panel.  Given complete resolution of presenting acute focal neurologic deficits, TPA was not administered.    Plan: Nursing bedside swallow evaluation, and will not initiate oral medications or diet until the patient has passed this.  Continue home high intensity statin in the form of atorvastatin 80 mg p.o. nightly with next dose to occur now for plaque stabilization qualities.  Per telestroke neurology consult, will administer aspirin 81 mg p.o. x1 now and continue chronic anticoagulation with warfarin.  Per this consult, no indication for course of dual antiplatelet therapy at this point.  MRI of the brain.  TTE with  bubble study to evaluate for intracardiac thrombus, septal aneurysm, or septal wall defect.  Check lipid panel and hemoglobin A1c.  Neuro checks per protocol.  Head of the bed at 30 degrees.  EKG x1 now.  Monitor on telemetry.  I have placed orders for physical therapy and Occupational Therapy consults to occur in the morning.     #) Paroxysmal atrial fibrillation: With suspected presenting TIA in mind, in the setting of a CHA2DS2-VASc score of 6, conferring an annual thromboembolic risk of [id number], there is an indication for the patient to be on chronic anticoagulation for thromboembolic prophylaxis. Consistent with this, the  patient is chronically anticoagulated on warfarin, with presenting INR found to be therapeutic at 2.4.  She is on multiple AV nodal blocking agents at home, including Lopressor and long-acting Cardizem.   Plan: I have placed an inpatient pharmacy consult for assistance with warfarin dosing during this hospitalization.  Continue outpatient AV nodal blocking agents.  Monitor on telemetry as a component of work-up for TIA.      #) Essential hypertension: Outpatient antihypertensive medications include benazepril, Lopressor, and long-acting Cardizem.  Plan: We will hold home benazepril for now, will continuing outpatient Lopressor and Cardizem.  Routine monitoring of blood pressure per stroke protocol.      #) Hyperlipidemia: On high intensity atorvastatin 80 mg p.o. daily at home.  As a component of TIA work-up, including evaluation for associated modifiable risk factors, will check lipid panel in the morning.  Plan: Check lipid panel in the morning.  Continue home high intensity atorvastatin, with next dose to occur now.      #): Type 2 diabetes mellitus, the patient reports that this is managed via lifestyle modifications, and that she is not currently on any exogenous insulin or oral hypoglycemic agents.  Presenting blood sugar per presenting BMP noted to be 114.  Plan: Check hemoglobin A1c as a component of evaluation of potentially modifiable risk factors for TIA.  Accu-Cheks q. before meals and at bedtime with low-dose sliding scale insulin.     DVT prophylaxis: Chronically anticoagulated on warfarin, with presenting INR found to be within therapeutic range. Code Status: Full code Family Communication: None Disposition Plan:  Per Rounding Team Consults called: Telestroke neurologist (Dr. Mike Craze), as above.  Admission status: Observation; med telemetry.     PLEASE NOTE THAT DRAGON DICTATION SOFTWARE WAS USED IN THE CONSTRUCTION OF THIS NOTE.   [facility] Pager [phone] From 3PM- 11PM.   Otherwise, please contact night-coverage  [email or web address] Password [email or web address]  06/02/2019, 5:01 PM

## 2019-06-02 NOTE — ED Notes (Signed)
Pt is aware we need urine.

## 2019-06-02 NOTE — Consult Note (Addendum)
[facility] Tele[facility]urology Consult Services   Date of Service:   06/02/2019 14:35:06  Impression:     .  G45.9 - Transient cerebral ischaemic attack, unspecified  Comments/Sign-Out: Patient exam is suggestive of a possible right brain TIA. When she came she had some left-sided weakness and facial droop and speech difficulty. All the symptoms have improved. At this point I am going to recommend getting an MRI of the head, MRA of the head and neck, echocardiogram. She will be followed by neurology. I will recommend calling us again if you have any further questions.  Metrics: Last Known Well: 06/02/2019 11:00:00 [facility] Notification Time: 06/02/2019 14:32:43 Arrival Time: 06/02/2019 14:12:00 Stamp Time: 06/02/2019 14:35:06 Time First Login Attempt: 06/02/2019 14:22:00 Video Start Time: 06/02/2019 14:22:00  Symptoms: left sided weakness NIHSS Start Assessment Time: 06/02/2019 14:39:39 Patient is not a candidate for Alteplase/Activase. Patient was not deemed candidate for Alteplase/Activase thrombolytics because of Resolved symptoms (no residual disabling symptoms). Video End Time: 06/02/2019 14:45:35  CT head showed no acute hemorrhage or acute core infarct.  Lower Likelihood of Large Vessel Occlusion but Following Stat Studies are Recommended  CTA Head and Neck. And was negative for LVO   ED Physician notified of diagnostic impression and management plan on 06/02/2019 15:33:34  Our recommendations are outlined below.  Recommendations:     .  Activate Stroke Protocol Admission/Order Set     .  Stroke/Telemetry Floor     .  Neuro Checks     .  Bedside Swallow Eval     .  DVT Prophylaxis     .  IV Fluids, Normal Saline     .  Head of Bed 30 Degrees     .  Euglycemia and Avoid Hyperthermia (PRN Acetaminophen)     .  Initiate Aspirin 81 MG Daily  Routine Consultation with [facility] for Follow up Care  Sign Out:     .  Discussed with  Emergency Department Provider    ------------------------------------------------------------------------------  History of Present Illness: Patient is a 68 year old Female.  Patient was brought by EMS for symptoms of left sided weakness  68 year old female with past medical history of hypertension, hyperlipidemia, A. fib came to the hospital because of left-sided weakness and speech difficulty. The symptoms started suddenly. She was having difficulty with speaking and also had some left-sided weakness. Patient reports that now she is back to her baseline. She could not tell me why she is here. She denies any focal body numbness tingling, gait or balance issues.    Past Medical History:     . Hypertension     . Diabetes Mellitus     . Hyperlipidemia     . Atrial Fibrillation     . Coronary Artery Disease     . Stroke  Anticoagulant use:  Coumadin       Examination: BP(133/77), Pulse(69), Blood Glucose(114) 1A: Level of Consciousness - Alert; keenly responsive + 0 1B: Ask Month and Age - Both Questions Right + 0 1C: Blink Eyes & Squeeze Hands - Performs Both Tasks + 0 2: Test Horizontal Extraocular Movements - Normal + 0 3: Test Visual Fields - No Visual Loss + 0 4: Test Facial Palsy (Use Grimace if Obtunded) - Normal symmetry + 0 5A: Test Left Arm Motor Drift - No Drift for 10 Seconds + 0 5B: Test Right Arm Motor Drift - No Drift for 10 Seconds + 0 6A: Test Left Leg Motor Drift - No Drift for  5 Seconds + 0 6B: Test Right Leg Motor Drift - No Drift for 5 Seconds + 0 7: Test Limb Ataxia (FNF/Heel-Shin) - No Ataxia + 0 8: Test Sensation - Normal; No sensory loss + 0 9: Test Language/Aphasia - Normal; No aphasia + 0 10: Test Dysarthria - Normal + 0 11: Test Extinction/Inattention - No abnormality + 0  NIHSS Score: 0  Pre-Morbid Modified Ranking Scale: 0 Points = No symptoms at all  Patient/Family was informed the Neurology Consult would happen via TeleHealth consult by  way of interactive audio and video telecommunications and consented to receiving care in this manner.   Due to the immediate potential for life-threatening deterioration due to underlying acute neurologic illness, I spent 30 minutes providing critical care. This time includes time for face to face visit via telemedicine, review of medical records, imaging studies and discussion of findings with providers, the patient and/or family.   Dr Faustino Congress   [facility] [phone]  Case [id number]

## 2019-06-02 NOTE — ED Notes (Signed)
Pt noted to be weak on bilateral arms with ataxia in both arms. Able to answer questions while in CT . Once back in room pt is more alert and able to follow all commands when asked questions. Pt unable to remember what happens. Per husband seems to be back to baseline

## 2019-06-02 NOTE — Progress Notes (Signed)
ANTICOAGULATION CONSULT NOTE - Initial Up Consult   Pharmacy Consult for warfarin dosing  Indication: atrial fibrillation   No Known Allergies    Patient Measurements:  There is no height or weight on file to calculate BMI. Samantha Vega               Temp: 97.8 F (36.6 C) (12/17 1830) Temp Source: Oral (12/17 1830) BP: 156/102 (12/17 1830) Pulse Rate: 101 (12/17 1830)  Labs: Recent Labs    06/01/19 0901 06/02/19 1418 06/02/19 1429  HGB  --  14.5 15.6*  HCT  --  43.9 46.0  PLT  --  190  --   APTT  --  38*  --   LABPROT  --  25.8*  --   INR 2.6 2.4*  --   CREATININE  --  0.94 1.00    CrCl cannot be calculated (Unknown ideal weight.).     Goal of Therapy:  inr 2-3  Monitor platelets by anticoagulation protocol: yes     Prior to Admission Warfarin Dosing:  Samantha Vega takes 5 mg of warfarin all days except Thursday and Sunday she takes 7.5mg  po.   Admit INR was 2.4 Lab Results  Component Value Date   INR 2.4 (H) 06/02/2019   INR 2.6 06/01/2019   INR 2.8 05/04/2019    Assessment: Samantha Vega a 68 y.o. female requires anticoagulation with warfarin for the indication of  afib. Warfarin will be initiated inpatient following pharmacy protocol per pharmacy consult. Patient most recent blood work is as follows:  CBC Latest Ref Rng & Units 06/02/2019 06/02/2019 04/04/2019  WBC 4.0 - 10.5 K/uL - 5.5 4.6  Hemoglobin 12.0 - 15.0 g/dL 15.6(H) 14.5 13.6  Hematocrit 36.0 - 46.0 % 46.0 43.9 40.8  Platelets 150 - 400 K/uL - 190 199   Med history technician was unable to determine if pt had dose of warfarin today or not due to pt and pt spouse being unavailiable for interview. Will dose tomorrow based on INR since therapeutic today.   Plan: Hold further warfarin dosing tonight CBC daily  Monitor INR daily Monitor for signs and symptoms of bleeding   Donna Christen Miquel Lamson, PharmD, MBA, BCGP Clinical Pharmacist

## 2019-06-02 NOTE — ED Triage Notes (Addendum)
EMS reports that pt was seen normal by her husband at 11am and then at 1413 she was sitting at table with confusion and difficulty speaking with weakness and ataxia noted to left arm. Upon arrival pt having difficulty speaking and left sided arm weakness Pt is on coumadin for afib. Took last dose coumadin this am

## 2019-06-03 ENCOUNTER — Observation Stay (HOSPITAL_BASED_OUTPATIENT_CLINIC_OR_DEPARTMENT_OTHER): Payer: Medicare Other

## 2019-06-03 DIAGNOSIS — G459 Transient cerebral ischemic attack, unspecified: Secondary | ICD-10-CM | POA: Diagnosis not present

## 2019-06-03 LAB — COMPREHENSIVE METABOLIC PANEL
ALT: 50 U/L — ABNORMAL HIGH (ref 0–44)
AST: 27 U/L (ref 15–41)
Albumin: 3.2 g/dL — ABNORMAL LOW (ref 3.5–5.0)
Alkaline Phosphatase: 131 U/L — ABNORMAL HIGH (ref 38–126)
Anion gap: 6 (ref 5–15)
BUN: 11 mg/dL (ref 8–23)
CO2: 25 mmol/L (ref 22–32)
Calcium: 8.8 mg/dL — ABNORMAL LOW (ref 8.9–10.3)
Chloride: 106 mmol/L (ref 98–111)
Creatinine, Ser: 0.7 mg/dL (ref 0.44–1.00)
GFR calc Af Amer: >60 mL/min (ref >60)
GFR calc non Af Amer: >60 mL/min (ref >60)
Glucose, Bld: 105 mg/dL — ABNORMAL HIGH (ref 70–99)
Potassium: 3.8 mmol/L (ref 3.5–5.1)
Sodium: 137 mmol/L (ref 135–145)
Total Bilirubin: 0.7 mg/dL (ref 0.3–1.2)
Total Protein: 6.8 g/dL (ref 6.5–8.1)

## 2019-06-03 LAB — URINALYSIS, ROUTINE W REFLEX MICROSCOPIC
Bilirubin Urine: NEGATIVE
Glucose, UA: NEGATIVE mg/dL
Hgb urine dipstick: NEGATIVE
Ketones, ur: NEGATIVE mg/dL
Leukocytes,Ua: NEGATIVE
Nitrite: NEGATIVE
Protein, ur: NEGATIVE mg/dL
Specific Gravity, Urine: 1.042 — ABNORMAL HIGH (ref 1.005–1.030)
pH: 5 (ref 5.0–8.0)

## 2019-06-03 LAB — CBC
HCT: 40.8 % (ref 36.0–46.0)
Hemoglobin: 13 g/dL (ref 12.0–15.0)
MCH: 28.5 pg (ref 26.0–34.0)
MCHC: 31.9 g/dL (ref 30.0–36.0)
MCV: 89.5 fL (ref 80.0–100.0)
Platelets: 195 10*3/uL (ref 150–400)
RBC: 4.56 MIL/uL (ref 3.87–5.11)
RDW: 15 % (ref 11.5–15.5)
WBC: 4.8 10*3/uL (ref 4.0–10.5)
nRBC: 0 % (ref 0.0–0.2)

## 2019-06-03 LAB — RAPID URINE DRUG SCREEN, HOSP PERFORMED
Amphetamines: NOT DETECTED
Barbiturates: NOT DETECTED
Benzodiazepines: NOT DETECTED
Cocaine: NOT DETECTED
Opiates: NOT DETECTED
Tetrahydrocannabinol: NOT DETECTED

## 2019-06-03 LAB — PROTIME-INR
INR: 2.5 — ABNORMAL HIGH (ref 0.8–1.2)
Prothrombin Time: 27.1 seconds — ABNORMAL HIGH (ref 11.4–15.2)

## 2019-06-03 LAB — SARS CORONAVIRUS 2 (TAT 6-24 HRS): SARS Coronavirus 2: NEGATIVE

## 2019-06-03 LAB — HIV ANTIBODY (ROUTINE TESTING W REFLEX): HIV Screen 4th Generation wRfx: NONREACTIVE

## 2019-06-03 LAB — LIPID PANEL
Cholesterol: 139 mg/dL (ref 0–200)
HDL: 26 mg/dL — ABNORMAL LOW (ref >40)
LDL Cholesterol: 97 mg/dL (ref 0–99)
Total CHOL/HDL Ratio: 5.3 RATIO
Triglycerides: 79 mg/dL (ref <150)
VLDL: 16 mg/dL (ref 0–40)

## 2019-06-03 LAB — HEMOGLOBIN A1C
Hgb A1c MFr Bld: 7.1 % — ABNORMAL HIGH (ref 4.8–5.6)
Mean Plasma Glucose: 157.07 mg/dL

## 2019-06-03 LAB — GLUCOSE, CAPILLARY
Glucose-Capillary: 96 mg/dL (ref 70–99)
Glucose-Capillary: 87 mg/dL (ref 70–99)

## 2019-06-03 LAB — MAGNESIUM: Magnesium: 1.9 mg/dL (ref 1.7–2.4)

## 2019-06-03 MED ORDER — METOPROLOL TARTRATE 100 MG PO TABS: 100.0000 mg | ORAL_TABLET | Freq: Two times a day (BID) | ORAL | 4 refills | Status: DC

## 2019-06-03 MED ORDER — BENAZEPRIL HCL 20 MG PO TABS: 20.0000 mg | ORAL_TABLET | Freq: Every day | ORAL | 4 refills | Status: DC

## 2019-06-03 MED ORDER — DILTIAZEM HCL ER COATED BEADS 120 MG PO CP24: 120.0000 mg | ORAL_CAPSULE | Freq: Every day | ORAL | 5 refills | Status: DC

## 2019-06-03 MED ORDER — METFORMIN HCL 500 MG PO TABS: 500.0000 mg | ORAL_TABLET | Freq: Two times a day (BID) | ORAL | 11 refills | Status: DC

## 2019-06-03 MED ORDER — ATORVASTATIN CALCIUM 80 MG PO TABS: 80.0000 mg | ORAL_TABLET | Freq: Every day | ORAL | 3 refills | Status: DC

## 2019-06-03 NOTE — Evaluation (Addendum)
Physical Therapy Evaluation Patient Details Name: Samantha Vega MRN: [id number] DOB: 1951-03-02 Today's Date: 06/03/2019   History of Present Illness  Samantha Vega is a 68 y.o. female with medical history significant for paroxysmal atrial fibrillation chronically anticoagulated on warfarin, hypertension, hyperlipidemia, type 2 diabetes mellitus managed via lifestyle modifications, who is admitted to [facility] on 06/02/2019 with suspected TIA after presenting from home to [facility] emergency department complaining of left upper extremity weakness and left facial droop.    Clinical Impression  Patient functioning at baseline for functional mobility and gait. She is independent with all bed mobility, transfers and ambulation. She deomonstrates minimally impaired activity tolerance with ambulation today and becomes SOB and fatigued toward the end of ambulation. Patient does not require any additional skilled physical therapy at this time. Patient left in chair - RN notified. RN made aware of patient performance today and that she can ambulate with nursing. Patient discharged to care of nursing for ambulation daily as tolerated for length of stay.      Follow Up Recommendations No PT follow up    Equipment Recommendations  None recommended by PT    Recommendations for Other Services       Precautions / Restrictions Precautions Precautions: Fall Restrictions Weight Bearing Restrictions: No      Mobility  Bed Mobility Overal bed mobility: Independent                Transfers Overall transfer level: Independent Equipment used: None                Ambulation/Gait Ambulation/Gait assistance: Modified independent (Device/Increase time) Gait Distance (Feet): 200 Feet Assistive device: None Gait Pattern/deviations: Step-through pattern;WFL(Within Functional Limits) Gait velocity: minimally decreased   General Gait Details: Patient ambulates WFL  without AD, becomes minimally SOB following and fatigued toward end of ambulation  Stairs            Wheelchair Mobility    Modified Rankin (Stroke Patients Only)       Balance Overall balance assessment: Needs assistance Sitting-balance support: Feet supported;No upper extremity supported Sitting balance-Leahy Scale: Normal Sitting balance - Comments: seated EOB     Standing balance-Leahy Scale: Good Standing balance comment: standing without AD                             Pertinent Vitals/Pain Pain Assessment: No/denies pain    Home Living Family/patient expects to be discharged to:: Private residence Living Arrangements: Spouse/significant other Available Help at Discharge: Family;Available PRN/intermittently Type of Home: House       Home Layout: One level Home Equipment: None      Prior Function Level of Independence: Independent               Hand Dominance   Dominant Hand: Right    Extremity/Trunk Assessment   Upper Extremity Assessment Upper Extremity Assessment: Defer to OT evaluation    Lower Extremity Assessment Lower Extremity Assessment: Overall WFL for tasks assessed    Cervical / Trunk Assessment Cervical / Trunk Assessment: Normal  Communication   Communication: No difficulties  Cognition Arousal/Alertness: Awake/alert Behavior During Therapy: WFL for tasks assessed/performed Overall Cognitive Status: Within Functional Limits for tasks assessed  General Comments      Exercises     Assessment/Plan    PT Assessment Patent does not need any further PT services  PT Problem List Decreased activity tolerance       PT Treatment Interventions      PT Goals (Current goals can be found in the Care Plan section)  Acute Rehab PT Goals Patient Stated Goal: Return home PT Goal Formulation: With patient Time For Goal Achievement: 06/10/19 Potential to  Achieve Goals: Good    Frequency     Barriers to discharge        Co-evaluation               AM-PAC PT "6 Clicks" Mobility  Outcome Measure Help needed turning from your back to your side while in a flat bed without using bedrails?: None Help needed moving from lying on your back to sitting on the side of a flat bed without using bedrails?: None Help needed moving to and from a bed to a chair (including a wheelchair)?: None Help needed standing up from a chair using your arms (e.g., wheelchair or bedside chair)?: None Help needed to walk in hospital room?: None Help needed climbing 3-5 steps with a railing? : None 6 Click Score: 24    End of Session Equipment Utilized During Treatment: Gait belt Activity Tolerance: Patient tolerated treatment well Patient left: in chair;with call bell/phone within reach Nurse Communication: Mobility status PT Visit Diagnosis: Other symptoms and signs involving the nervous system (R29.898);Other abnormalities of gait and mobility (R26.89);Unsteadiness on feet (R26.81)    Time: [id number] PT Time Calculation (min) (ACUTE ONLY): 16 min   Charges:   PT Evaluation $PT Eval Low Complexity: 1 Low PT Treatments $Therapeutic Activity: 8-22 mins        10:47 AM, 06/03/19 Mearl Latin PT, DPT Physical Therapist at [facility]

## 2019-06-03 NOTE — Discharge Summary (Signed)
Samantha Vega, is a 68 y.o. female  DOB 12/17/1950  MRN [id number].  Admission date:  06/02/2019  Admitting Physician  Rhetta Mura, DO  Discharge Date:  06/03/2019   Primary MD  Fayrene Helper, MD  Recommendations for primary care physician for things to follow:   1)Avoid ibuprofen/Advil/Aleve/Motrin/Goody Powders/Naproxen/BC powders/Meloxicam/Diclofenac/Indomethacin and other Nonsteroidal anti-inflammatory medications as these will make you more likely to bleed and can cause stomach ulcers, can also cause Kidney problems.   2) continue current medications as prescribed  3) metformin has been prescribed for you in order to improve your diabetic control  4) follow-up to primary care physician within a week or so for recheck and reevaluation  5) neck imaging studies suggest some abnormalities around the thyroid gland--thyroid ultrasound by your primary care physician advised  Admission Diagnosis  TIA (transient ischemic attack) [G45.9] Left-sided weakness [R53.1]   Discharge Diagnosis  TIA (transient ischemic attack) [G45.9] Left-sided weakness [R53.1]    Principal Problem:   TIA (transient ischemic attack) Active Problems:   Chronic anticoagulation   Diabetes mellitus type 2, diet-controlled ([place])   Hyperlipidemia LDL goal <70   Essential hypertension      Past Medical History:  Diagnosis Date  . Arthritis   . Chest pain    Noncardiac; evaluated and treated in 2008  . Diabetes mellitus   . Enlarged heart   . Hyperlipidemia   . Hypertension   . Obesity   . PAF (paroxysmal atrial fibrillation) ([place])   . Right bundle branch block    +Palpitations.   EKG 11/2006:  NSR, RBBB, LAFB, markedly delayed R-wave progression, no change; Echocardiogram in 10/2006-suboptimal quality, normal; Event recorder-PVCs, no significant arrhythmias or symptoms ;    Past Surgical History:   Procedure Laterality Date  . ABDOMINAL HYSTERECTOMY    . BREAST BIOPSY Left    benign  . CATARACT EXTRACTION W/PHACO Right 01/08/2016   Procedure: CATARACT EXTRACTION PHACO AND INTRAOCULAR LENS PLACEMENT (IOC);  Surgeon: Rutherford Guys, MD;  Location: [facility];  Service: Ophthalmology;  Laterality: Right;  CDE: 5.78  . CATARACT EXTRACTION W/PHACO Left 02/05/2016   Procedure: CATARACT EXTRACTION PHACO AND INTRAOCULAR LENS PLACEMENT LEFT EYE CDE=6.50;  Surgeon: Rutherford Guys, MD;  Location: [facility];  Service: Ophthalmology;  Laterality: Left;  . COLONOSCOPY  2006   Dr. Tamala Julian: Normal, repeat in 2009.  Marland Kitchen COLONOSCOPY N/A 10/08/2015   Procedure: COLONOSCOPY;  Surgeon: Danie Binder, MD;  Location: [facility];  Service: Endoscopy;  Laterality: N/A;  230 - moved to 1:30, spoke with pt   . POLYPECTOMY  10/08/2015   Procedure: POLYPECTOMY;  Surgeon: Danie Binder, MD;  Location: [facility];  Service: Endoscopy;;  cecal polyp removed via cold forceps with one clip placed/ rectal polyp removed via cold forcep  . SHOULDER SURGERY  2006   Left-post-trauma  . TOTAL ABDOMINAL HYSTERECTOMY W/ BILATERAL SALPINGOOPHORECTOMY         HPI  from the history and physical done on the day of  admission:    Chief Complaint: left upper extremity weakness  HPI: Samantha Vega is a 68 y.o. female with medical history significant for paroxysmal atrial fibrillation chronically anticoagulated on warfarin, hypertension, hyperlipidemia, type 2 diabetes mellitus managed via lifestyle modifications, who is admitted to [facility] on 06/02/2019 with suspected TIA after presenting from home to [facility] emergency department complaining of left upper extremity weakness and left facial droop.  The patient reports acute onset left upper extremity weakness associated with left facial droop, all starting at approximately 1430 on 06/02/2019 while sitting at the kitchen table visiting her sister.  Denies any associated  additional acute focal weakness, including now noted acute focal weakness in the left lower extremity.  Also denies any associated paresthesias, numbness, dysphagia, dizziness, vertigo, change in vision, blurry vision, diplopia, headache, or nausea/vomiting.  Denies any previous history of stroke.  In terms of modifiable risk factors, the patient acknowledges a history of paroxysmal atrial fibrillation for which she is chronically anticoagulated on warfarin, as well as a history of hypertension and hyperlipidemia.  She states that she is compliant with her outpatient high intensity atorvastatin 80 mg p.o. daily as well as her multiple antihypertensive medications which include benazepril, Lopressor, Cardizem.  She also acknowledges a history of type 2 diabetes mellitus, but states that this has been managed successfully with lifestyle modifications and that she is not currently on any exogenous insulin or oral hypoglycemic agents.  In terms of outpatient blood thinning agents, the patient reports that she is chronically anticoagulated on warfarin, and does not take any antiplatelet medications.  Denies any known history of obstructive sleep apnea.  Additionally, the patient confirms that she is a lifelong non-smoker.  The patient reports that shortly after presenting to [facility] emergency department, that she noted improvement in her left upper extremity weakness and left facial droop.  She reports subsequent complete resolution of these acute focal neurologic deficits, without any subsequent recurrence.   ED Course: Vital signs in the emergency department were notable for the following: Temperature max 97.6; heart rate 84-100; initial blood pressure noted to be 161/99, with most recent blood pressure noted to be 142/100; respiratory rate 15-23, and oxygen saturation 96 to 100% on room air.  Labs in the ED were notable for the following: Sodium 139, bicarbonate 24, creatinine 1.0, and glucose 114.   CBC notable for white blood cell count 5500, hemoglobin 14.5.  INR 2.4.  Noncontrast CT of the head showed no evidence of acute intracranial process, including no evidence of intracranial hemorrhage or acute ischemic infarct.  CTA of the head and neck showed no evidence of significant flow-limiting stenosis, thrombus, dissection, or aneurysm.  In the ED, the patient was evaluated by telestroke neurologist, Dr. Mike Craze, who, given interval resolution of left upper extremity weakness and left facial droop, felt the presentation was most suggestive of TIA.  He recommended ensuing admission to the hospitalist service for further evaluation and management of TIA, including pursuit of MRI of the brain, echocardiogram with bubble study, A1c, and lipid panel.      Hospital Course:      Brief summary- 68 y.o. female with medical history significant for paroxysmal atrial fibrillation chronically anticoagulated on warfarin, hypertension, hyperlipidemia, type 2 diabetes mellitus admitted on 06/02/2019 with possible TIA  A/p 1)Possible TIA--- left-sided upper extremity and facial droop-resolved--- without further recurrence -Telemetry neurology consult appreciated -UDS negative -MRI brain without acute findings -CTA neck and head without acute findings -CT head without  contrast without acute findings -Echocardiogram with preserved EF of 50 to 55% -Continue Coumadin  -LDL is 97, HDL is 26, c/n Lipitor. Even if his lipid panel is within desired limits, patient should still take Lipitor/Statin for it's Pleiotropic effects (beyond cholesterol lowering benefits)  2) possible thyroid abnormality--- thyroid ultrasound as outpatient advised  3)PAFib--stable, continue Coumadin for anticoagulation, continue Cardizem and metoprolol for rate control  4)DM2-A1c 7.1, start Metformin as prescribed  Discharge Condition: stable  Follow UP--- PCP as advised, will need thyroid imaging  Diet and Activity  recommendation:  As advised  Discharge Instructions    Discharge Instructions    Call MD for:  difficulty breathing, headache or visual disturbances   Complete by: As directed    Call MD for:  extreme fatigue   Complete by: As directed    Call MD for:  persistant dizziness or light-headedness   Complete by: As directed    Call MD for:  persistant nausea and vomiting   Complete by: As directed    Call MD for:  severe uncontrolled pain   Complete by: As directed    Call MD for:  temperature >100.4   Complete by: As directed    Diet - low sodium heart healthy   Complete by: As directed    Diet Carb Modified   Complete by: As directed    Discharge instructions   Complete by: As directed    1)Avoid ibuprofen/Advil/Aleve/Motrin/Goody Powders/Naproxen/BC powders/Meloxicam/Diclofenac/Indomethacin and other Nonsteroidal anti-inflammatory medications as these will make you more likely to bleed and can cause stomach ulcers, can also cause Kidney problems.   2) continue current medications as prescribed  3) metformin has been prescribed for you in order to improve your diabetic control  4) follow-up to primary care physician within a week or so for recheck and reevaluation  5) neck imaging studies suggest some abnormalities around the thyroid gland--thyroid ultrasound by your primary care physician advised   Increase activity slowly   Complete by: As directed         Discharge Medications     Allergies as of 06/03/2019   No Known Allergies     Medication List    TAKE these medications   atorvastatin 80 MG tablet Commonly known as: LIPITOR Take 1 tablet (80 mg total) by mouth daily.   benazepril 20 MG tablet Commonly known as: LOTENSIN Take 1 tablet (20 mg total) by mouth daily.   diltiazem 120 MG 24 hr capsule Commonly known as: CARDIZEM CD Take 1 capsule (120 mg total) by mouth daily.   furosemide 20 MG tablet Commonly known as: LASIX TAKE 1 TABLET BY MOUTH EVERY  DAY AS NEEDED What changed: reasons to take this   metFORMIN 500 MG tablet Commonly known as: Glucophage Take 1 tablet (500 mg total) by mouth 2 (two) times daily with a meal.   metoprolol tartrate 100 MG tablet Commonly known as: LOPRESSOR Take 1 tablet (100 mg total) by mouth 2 (two) times daily.   warfarin 5 MG tablet Commonly known as: COUMADIN Take as directed. If you are unsure how to take this medication, talk to your nurse or doctor. Original instructions: Take 5-7.5 mg by mouth See admin instructions. 1 tab daily and 1/2 tab on thurs and sun       Major procedures and Radiology Reports - PLEASE review detailed and final reports for all details, in brief -   CT Angio Head W or Wo Contrast  Result Date: 06/02/2019 CLINICAL DATA:  Left-sided weakness. Rule out stroke. EXAM: CT ANGIOGRAPHY HEAD AND NECK TECHNIQUE: Multidetector CT imaging of the head and neck was performed using the standard protocol during bolus administration of intravenous contrast. Multiplanar CT image reconstructions and MIPs were obtained to evaluate the vascular anatomy. Carotid stenosis measurements (when applicable) are obtained utilizing NASCET criteria, using the distal internal carotid diameter as the denominator. CONTRAST:  173mL OMNIPAQUE IOHEXOL 350 MG/ML SOLN COMPARISON:  CT head 06/02/2019 FINDINGS: CTA NECK FINDINGS Aortic arch: Standard branching. Imaged portion shows no evidence of aneurysm or dissection. No significant stenosis of the major arch vessel origins. Mild atherosclerotic calcification aortic arch. Right carotid system: Right carotid widely patent without stenosis or atherosclerotic disease Left carotid system: Left carotid widely patent without atherosclerotic disease or stenosis. Vertebral arteries: Both vertebral arteries widely patent without stenosis Skeleton: Cervical spine degenerative change. No acute skeletal abnormality. Other neck: Thyroid goiter. Multiple cystic and solid  nodules. Multiple calcified nodules. Solid nodule in the isthmus measuring 20 mm. Upper chest: Lung apices clear bilaterally. Review of the MIP images confirms the above findings CTA HEAD FINDINGS Anterior circulation: Mild atherosclerotic disease right cavernous carotid without stenosis. Atherosclerotic calcification left cavernous carotid with moderate stenosis. Anterior and middle cerebral arteries are patent without stenosis or large vessel occlusion. Negative for aneurysm. Posterior circulation: Both vertebral arteries patent to the basilar. PICA patent bilaterally. Basilar patent. Superior cerebellar and posterior cerebral arteries patent bilaterally without stenosis large vessel occlusion or aneurysm. Venous sinuses: Normal venous enhancement Anatomic variants: None Review of the MIP images confirms the above findings IMPRESSION: 1. No significant carotid or vertebral artery stenosis in the neck. 2. 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 3. No significant intracranial stenosis or large vessel occlusion. 4. Thyroid goiter with multiple cystic and solid nodules. 2 cm solid nodule in the isthmus. Recommend thyroid ultrasound. (ref: J Am Coll Radiol. 2015 Feb;12(2): 143-50). Electronically Signed   By: Franchot Gallo M.D.   On: 06/02/2019 15:27   CT Angio Neck W and/or Wo Contrast  Result Date: 06/02/2019 CLINICAL DATA:  Left-sided weakness. Rule out stroke. EXAM: CT ANGIOGRAPHY HEAD AND NECK TECHNIQUE: Multidetector CT imaging of the head and neck was performed using the standard protocol during bolus administration of intravenous contrast. Multiplanar CT image reconstructions and MIPs were obtained to evaluate the vascular anatomy. Carotid stenosis measurements (when applicable) are obtained utilizing NASCET criteria, using the distal internal carotid diameter as the denominator. CONTRAST:  151mL OMNIPAQUE IOHEXOL 350 MG/ML SOLN COMPARISON:  CT head 06/02/2019 FINDINGS:  CTA NECK FINDINGS Aortic arch: Standard branching. Imaged portion shows no evidence of aneurysm or dissection. No significant stenosis of the major arch vessel origins. Mild atherosclerotic calcification aortic arch. Right carotid system: Right carotid widely patent without stenosis or atherosclerotic disease Left carotid system: Left carotid widely patent without atherosclerotic disease or stenosis. Vertebral arteries: Both vertebral arteries widely patent without stenosis Skeleton: Cervical spine degenerative change. No acute skeletal abnormality. Other neck: Thyroid goiter. Multiple cystic and solid nodules. Multiple calcified nodules. Solid nodule in the isthmus measuring 20 mm. Upper chest: Lung apices clear bilaterally. Review of the MIP images confirms the above findings CTA HEAD FINDINGS Anterior circulation: Mild atherosclerotic disease right cavernous carotid without stenosis. Atherosclerotic calcification left cavernous carotid with moderate stenosis. Anterior and middle cerebral arteries are patent without stenosis or large vessel occlusion. Negative for aneurysm. Posterior circulation: Both vertebral arteries patent to the basilar. PICA patent bilaterally. Basilar patent. Superior cerebellar and posterior cerebral arteries  patent bilaterally without stenosis large vessel occlusion or aneurysm. Venous sinuses: Normal venous enhancement Anatomic variants: None Review of the MIP images confirms the above findings IMPRESSION: 1. No significant carotid or vertebral artery stenosis in the neck. 2. 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 3. No significant intracranial stenosis or large vessel occlusion. 4. Thyroid goiter with multiple cystic and solid nodules. 2 cm solid nodule in the isthmus. Recommend thyroid ultrasound. (ref: J Am Coll Radiol. 2015 Feb;12(2): 143-50). Electronically Signed   By: Franchot Gallo M.D.   On: 06/02/2019 15:27   MR BRAIN WO CONTRAST  Result  Date: 06/02/2019 CLINICAL DATA:  Acute confusion and difficulty speaking beginning 1100 hours. EXAM: MRI HEAD WITHOUT CONTRAST TECHNIQUE: Multiplanar, multiecho pulse sequences of the brain and surrounding structures were obtained without intravenous contrast. COMPARISON:  CT studies same day FINDINGS: Brain: Diffusion imaging does not show any acute or subacute infarction. The brainstem and cerebellum are normal. Cerebral hemispheres show minimal small vessel change in the basal ganglia and deep white matter. No cortical or large vessel territory infarction. No mass lesion, hemorrhage, hydrocephalus or extra-axial collection. Vascular: Major vessels at the base of the brain show flow. Skull and upper cervical spine: Negative Sinuses/Orbits: Clear/normal Other: None IMPRESSION: No acute or significant imaging finding. Normal for age, with minimal small vessel change of the basal ganglia and hemispheric white matter. Electronically Signed   By: Nelson Chimes M.D.   On: 06/02/2019 17:42   CT HEAD CODE STROKE WO CONTRAST  Result Date: 06/02/2019 CLINICAL DATA:  Code stroke.  Left arm weakness.  Ataxia EXAM: CT HEAD WITHOUT CONTRAST TECHNIQUE: Contiguous axial images were obtained from the base of the skull through the vertex without intravenous contrast. COMPARISON:  MRI head 07/12/2012 FINDINGS: Brain: No evidence of acute infarction, hemorrhage, hydrocephalus, extra-axial collection or mass lesion/mass effect. Vascular: Negative for hyperdense vessel. Atherosclerotic calcification in the cavernous carotid bilaterally. Skull: Negative Sinuses/Orbits: Bilateral cataract surgery. Mucoperiosteal thickening maxillary sinus bilaterally. Other: None ASPECTS ([place] Stroke Program Early CT Score) - Ganglionic level infarction (caudate, lentiform nuclei, internal capsule, insula, M1-M3 cortex): 7 - Supraganglionic infarction (M4-M6 cortex): 3 Total score (0-10 with 10 being normal): 10 IMPRESSION: 1. Negative CT brain  without contrast.  No acute abnormality 2. ASPECTS is 10 3. These results were called by telephone at the time of interpretation on 06/02/2019 at 2:34 pm to provider JOSHUA LONG , who verbally acknowledged these results. Electronically Signed   By: Franchot Gallo M.D.   On: 06/02/2019 14:34    Micro Results    Recent Results (from the past 240 hour(s))  SARS CORONAVIRUS 2 (TAT 6-24 HRS) Nasopharyngeal Nasopharyngeal Swab     Status: None   Collection Time: 06/02/19  4:53 PM   Specimen: Nasopharyngeal Swab  Result Value Ref Range Status   SARS Coronavirus 2 NEGATIVE NEGATIVE Final    Comment: (NOTE) SARS-CoV-2 target nucleic acids are NOT DETECTED. The SARS-CoV-2 RNA is generally detectable in upper and lower respiratory specimens during the acute phase of infection. Negative results do not preclude SARS-CoV-2 infection, do not rule out co-infections with other pathogens, and should not be used as the sole basis for treatment or other patient management decisions. Negative results must be combined with clinical observations, patient history, and epidemiological information. The expected result is Negative. Fact Sheet for Patients: SugarRoll.be Fact Sheet for Healthcare Providers: https://www.woods-mathews.com/ This test is not yet approved or cleared by the [place] FDA and  has been  authorized for detection and/or diagnosis of SARS-CoV-2 by FDA under an Emergency Use Authorization (EUA). This EUA will remain  in effect (meaning this test can be used) for the duration of the COVID-19 declaration under Section 56 4(b)(1) of the Act, 21 U.S.C. section 360bbb-3(b)(1), unless the authorization is terminated or revoked sooner. Performed at [facility], [place] [address], [place], [place] [address]        Today   Subjective    Osmary Schleifer today has no complaints -No further neuro concerns, neuro symptoms resolved and have  not recurred        No chest pains, no palpitations, no dizziness  Patient has been seen and examined prior to discharge   Objective   Blood pressure (!) 139/99, pulse 95, temperature 98.2 F (36.8 C), temperature source Oral, resp. rate 18, height 5\' 5"  (1.651 m), weight 100.3 kg, SpO2 98 %.   Intake/Output Summary (Last 24 hours) at 06/03/2019 1246 Last data filed at 06/03/2019 0541 Gross per 24 hour  Intake --  Output 400 ml  Net -400 ml    Exam Gen:- Awake Alert, no acute distress  HEENT:- [place].AT, No sclera icterus Neck-Supple Neck,No JVD,.  Lungs-  CTAB , good air movement bilaterally  CV- S1, S2 normal, irregular Abd-  +ve B.Sounds, Abd Soft, No tenderness,    Extremity/Skin:- No  edema,   good pulses Psych-affect is appropriate, oriented x3 Neuro-no new focal deficits, no tremors    Data Review   CBC w Diff:  Lab Results  Component Value Date   WBC 4.8 06/03/2019   HGB 13.0 06/03/2019   HGB 13.6 04/04/2019   HCT 40.8 06/03/2019   HCT 40.8 04/04/2019   PLT 195 06/03/2019   PLT 199 04/04/2019   LYMPHOPCT 36 06/02/2019   MONOPCT 9 06/02/2019   EOSPCT 2 06/02/2019   BASOPCT 1 06/02/2019    CMP:  Lab Results  Component Value Date   NA 137 06/03/2019   NA 138 04/04/2019   K 3.8 06/03/2019   CL 106 06/03/2019   CO2 25 06/03/2019   BUN 11 06/03/2019   BUN 14 04/04/2019   CREATININE 0.70 06/03/2019   CREATININE 0.79 06/15/2017   PROT 6.8 06/03/2019   PROT 7.1 04/04/2019   ALBUMIN 3.2 (L) 06/03/2019   ALBUMIN 3.9 04/04/2019   BILITOT 0.7 06/03/2019   BILITOT 0.4 04/04/2019   ALKPHOS 131 (H) 06/03/2019   AST 27 06/03/2019   ALT 50 (H) 06/03/2019  .   Total Discharge time is about 33 minutes  Roxan Hockey M.D on 06/03/2019 at 12:46 PM  Go to [email or web address] -  for contact info  [facility] - Office  [phone]

## 2019-06-03 NOTE — Discharge Instructions (Signed)
1)Avoid ibuprofen/Advil/Aleve/Motrin/Goody Powders/Naproxen/BC powders/Meloxicam/Diclofenac/Indomethacin and other Nonsteroidal anti-inflammatory medications as these will make you more likely to bleed and can cause stomach ulcers, can also cause Kidney problems.   2) continue current medications as prescribed  3) metformin has been prescribed for you in order to improve your diabetic control  4) follow-up to primary care physician within a week or so for recheck and reevaluation  5) neck imaging studies suggest some abnormalities around the thyroid gland--thyroid ultrasound by your primary care physician advised

## 2019-06-03 NOTE — Progress Notes (Signed)
  Echocardiogram 2D Echocardiogram has been performed.  Jannett Celestine 06/03/2019, 11:02 AM

## 2019-06-03 NOTE — Evaluation (Signed)
Occupational Therapy Evaluation Patient Details Name: Samantha Vega MRN: [id number] DOB: 06-30-1950 Today's Date: 06/03/2019    History of Present Illness Samantha Vega is a 68 y.o. female with medical history significant for paroxysmal atrial fibrillation chronically anticoagulated on warfarin, hypertension, hyperlipidemia, type 2 diabetes mellitus managed via lifestyle modifications, who is admitted to [facility] on 06/02/2019 with suspected TIA after presenting from home to [facility] emergency department complaining of left upper extremity weakness and left facial droop.   Clinical Impression   Patient in bed upon therapy arrival and finishing breakfast. Patient reports that she is experiencing no LUE weakness as she was before. She reports no deficits this AM. Pt was able to complete bed mobility and transfer from bed to recliner independently. No issues with balance were noted during transition. BUE strength was functional during tasks completed. No follow up OT services recommended at this time. Thank you for the referral.     Follow Up Recommendations  No OT follow up    Equipment Recommendations  None recommended by OT       Precautions / Restrictions Precautions Precautions: None Restrictions Weight Bearing Restrictions: No      Mobility Bed Mobility Overal bed mobility: Independent      Transfers Overall transfer level: Independent Equipment used: None        Balance Overall balance assessment: No apparent balance deficits (not formally assessed)       ADL either performed or assessed with clinical judgement   ADL Overall ADL's : Independent;At baseline             Vision Baseline Vision/History: No visual deficits Patient Visual Report: No change from baseline Vision Assessment?: No apparent visual deficits            Pertinent Vitals/Pain Pain Assessment: No/denies pain     Hand Dominance Right   Extremity/Trunk  Assessment Upper Extremity Assessment Upper Extremity Assessment: Overall WFL for tasks assessed   Lower Extremity Assessment Lower Extremity Assessment: Defer to PT evaluation       Communication Communication Communication: No difficulties   Cognition Arousal/Alertness: Awake/alert Behavior During Therapy: WFL for tasks assessed/performed Overall Cognitive Status: Within Functional Limits for tasks assessed                Home Living Family/patient expects to be discharged to:: Private residence Living Arrangements: Spouse/significant other Available Help at Discharge: Family;Available PRN/intermittently Type of Home: House       Home Equipment: None          Prior Functioning/Environment Level of Independence: Independent                 OT Problem List: Decreased strength                          AM-PAC OT "6 Clicks" Daily Activity     Outcome Measure Help from another person eating meals?: None Help from another person taking care of personal grooming?: None Help from another person toileting, which includes using toliet, bedpan, or urinal?: None Help from another person bathing (including washing, rinsing, drying)?: None Help from another person to put on and taking off regular upper body clothing?: None Help from another person to put on and taking off regular lower body clothing?: None 6 Click Score: 24   End of Session Equipment Utilized During Treatment: (None)  Activity Tolerance: Patient tolerated treatment well Patient left: in chair;with call bell/phone within reach  OT Visit Diagnosis: Muscle weakness (generalized) (M62.81)                Time: [id number] OT Time Calculation (min): 12 min Charges:  OT General Charges $OT Visit: 1 Visit OT Evaluation $OT Eval Low Complexity: [place], OTR/L,CBIS  [phone]   Samantha Vega, Clarene Duke 06/03/2019, 9:13 AM

## 2019-06-07 ENCOUNTER — Telehealth: Payer: Self-pay

## 2019-06-07 NOTE — Telephone Encounter (Signed)
Transition Care Management Follow-up Telephone Call   Date discharged?06/03/2019                How have you been since you were released from the hospital? just fine   Do you understand why you were in the hospital? TIA   Do you understand the discharge instructions? yes   Where were you discharged to? home   Items Reviewed:  Medications reviewed: yes  Allergies reviewed: yes  Dietary changes reviewed: yes  Referrals reviewed: no new referrals   Functional Questionnaire:   Activities of Daily Living (ADLs):  yes    Any transportation issues/concerns?: no   Any patient concerns? no   Confirmed importance and date/time of follow-up visits scheduled 06/15/2019 with DNP     Confirmed with patient if condition begins to worsen call PCP or go to the ER.  Patient was given the office number and encouraged to call back with question or concerns. Yes with verbal understanding

## 2019-06-15 ENCOUNTER — Inpatient Hospital Stay: Payer: Medicare Other | Admitting: Family Medicine

## 2019-06-16 ENCOUNTER — Ambulatory Visit (INDEPENDENT_AMBULATORY_CARE_PROVIDER_SITE_OTHER): Payer: Medicare Other | Admitting: Family Medicine

## 2019-06-16 ENCOUNTER — Encounter: Payer: Self-pay | Admitting: Family Medicine

## 2019-06-16 ENCOUNTER — Other Ambulatory Visit: Payer: Self-pay

## 2019-06-16 VITALS — BP 118/80 | HR 69 | Temp 98.6°F | Resp 15 | Ht 65.0 in | Wt 212.0 lb

## 2019-06-16 DIAGNOSIS — E785 Hyperlipidemia, unspecified: Secondary | ICD-10-CM

## 2019-06-16 DIAGNOSIS — I10 Essential (primary) hypertension: Secondary | ICD-10-CM

## 2019-06-16 DIAGNOSIS — E1169 Type 2 diabetes mellitus with other specified complication: Secondary | ICD-10-CM

## 2019-06-16 DIAGNOSIS — E049 Nontoxic goiter, unspecified: Secondary | ICD-10-CM

## 2019-06-16 DIAGNOSIS — Z7689 Persons encountering health services in other specified circumstances: Secondary | ICD-10-CM

## 2019-06-16 NOTE — Assessment & Plan Note (Signed)
All findings were within normal range today.  Outside of the findings of the thyroid goiter which might have been assessed years ago but she is not sure she remembers this.  She is well in the ultrasound of this again will be doing this today.  Reports tolerating her Metformin advised to continue taking this at this time.  Doing well and has no other complaints today.

## 2019-06-16 NOTE — Assessment & Plan Note (Signed)
Controlled, no medication change at this time.  Advised to continue doing physical activity 30 minutes minimum daily.  Stick to a DASH diet as well.  Take all medications as directed

## 2019-06-16 NOTE — Assessment & Plan Note (Signed)
Noted on CT scan has had ultrasound biopsies in the past.  Goiter needs to be assessed we will do this for additional follow-up.  Has no complaints at this time or signs or symptoms of thyroid concern.  Patient is agreeable to getting ultrasound

## 2019-06-16 NOTE — Assessment & Plan Note (Signed)
Needs to get updated labs as per Dr. Griffin Dakin note.  Was started on Metformin while in the hospital reports taking this very well and without issue.  Advised to continue this at this time.  Follow-up at next visit

## 2019-06-16 NOTE — Patient Instructions (Signed)
I appreciate the opportunity to provide you with care for your health and wellness. Today we discussed:  Recent hospital stay   Follow up: 08/10/2019  No labs or referrals today  [place] of thyroid was ordered today  Please continue to practice social distancing to keep you, your family, and our community safe.  If you must go out, please wear a mask and practice good handwashing.  It was a pleasure to see you and I look forward to continuing to work together on your health and well-being. Please do not hesitate to call the office if you need care or have questions about your care.  Have a wonderful day and week. With Gratitude, Cherly Beach, DNP, AGNP-BC

## 2019-06-16 NOTE — Progress Notes (Signed)
Subjective:  Patient ID: Samantha Vega, female    DOB: [birth year]  Age: 68 y.o. MRN: [id number]  CC:  Chief Complaint  Patient presents with  . Transitions Of Care      HPI:  Samantha Rasmus Robertsis a 68 y.o.femalewith medical history significant forparoxysmal atrial fibrillation chronically anticoagulated on warfarin,hypertension, hyperlipidemia, type 2 diabetes mellitus managed via lifestyle modifications, who is admitted to [facility] on 06/02/2019 with suspected TIA after presenting from home to [facility] emergency department complaining of left upper extremity weakness and left facial droop.  The patient reported acute onset left upper extremity weakness associated with left facial droop,all starting at approximately1430 on12/17/2020whilesitting at the kitchen table visiting her sister.She Denided any associated additional acute focal weakness, including now noted acute focal weakness in the left lower extremity.Also denied any associated paresthesias, numbness, dysphagia, dizziness, vertigo, change in vision, blurry vision, diplopia, headache,or nausea/vomiting. Denids any previous history of stroke.  In terms of outpatient blood thinning agents, the patient reports that she is chronically anticoagulated on warfarin, and does not take any antiplatelet medications.Denies any known history of obstructive sleep apnea.Additionally, the patient confirms that she is a lifelong non-smoker.  The patient reports that shortly after presenting to [facility] emergency department, that she notedimprovement in herleft upper extremity weakness andleftfacial droop.She reports subsequent complete resolution of these acute focal neurologic deficits,without any subsequent recurrence.  Vital signs in the emergency department were notable for the following: Temperature max 97.6; heart rate 84-100; initial blood pressure noted to be 161/99, with most recent blood  pressure noted to be 142/100; respiratory rate 15-23, and oxygen saturation 96 to 100% on room air.  Labs in the ED were notable for the following: Sodium 139, bicarbonate 24, creatinine 1.0, and glucose 114. CBC notable for white blood cell count 5500, hemoglobin 14.5. INR 2.4.  UDS negative MRI brain without acute findings CTA neck and head without acute findings CT head without contrast without acute findings Echocardiogram with preserved EF of 50 to 55%  Reports today taking all medications as directed and without side effects. Tolerating Metformin well. Is agreeable to [place] of thyroid.   Today patient denies signs and symptoms of COVID 19 infection including fever, chills, cough, shortness of breath, and headache. Past Medical, Surgical, Social History, Allergies, and Medications have been Reviewed.   Past Medical History:  Diagnosis Date  . Arthritis   . Chest pain    Noncardiac; evaluated and treated in 2008  . Diabetes mellitus   . Enlarged heart   . Hyperlipidemia   . Hypertension   . Obesity   . PAF (paroxysmal atrial fibrillation) ([place])   . Right bundle branch block    +Palpitations.   EKG 11/2006:  NSR, RBBB, LAFB, markedly delayed R-wave progression, no change; Echocardiogram in 10/2006-suboptimal quality, normal; Event recorder-PVCs, no significant arrhythmias or symptoms ;    Current Meds  Medication Sig  . atorvastatin (LIPITOR) 80 MG tablet Take 1 tablet (80 mg total) by mouth daily.  . benazepril (LOTENSIN) 20 MG tablet Take 1 tablet (20 mg total) by mouth daily.  Marland Kitchen diltiazem (CARDIZEM CD) 120 MG 24 hr capsule Take 1 capsule (120 mg total) by mouth daily.  . furosemide (LASIX) 20 MG tablet TAKE 1 TABLET BY MOUTH EVERY DAY AS NEEDED (Patient taking differently: Take 20 mg by mouth daily as needed for fluid. )  . metFORMIN (GLUCOPHAGE) 500 MG tablet Take 1 tablet (500 mg total) by mouth 2 (  two) times daily with a meal.  . metoprolol tartrate (LOPRESSOR) 100 MG  tablet Take 1 tablet (100 mg total) by mouth 2 (two) times daily.  Marland Kitchen warfarin (COUMADIN) 5 MG tablet Take 5-7.5 mg by mouth See admin instructions. 1 tab daily and 1/2 tab on thurs and sun     ROS:  Review of Systems  Constitutional: Negative.   HENT: Negative.   Eyes: Negative.   Respiratory: Negative.   Cardiovascular: Negative.   Gastrointestinal: Negative.   Genitourinary: Negative.   Musculoskeletal: Negative.   Skin: Negative.   Neurological: Negative.   Endo/Heme/Allergies: Negative.   Psychiatric/Behavioral: Negative.   All other systems reviewed and are negative.    Objective:   Today's Vitals: BP 118/80   Pulse 69   Temp 98.6 F (37 C) (Oral)   Resp 15   Ht 5\' 5"  (1.651 m)   Wt 212 lb 0.6 oz (96.2 kg)   SpO2 96%   BMI 35.29 kg/m  Vitals with BMI 06/16/2019 06/03/2019 06/03/2019  Height 5\' 5"  - -  Weight 212 lbs 1 oz - -  BMI 0000000 - -  Systolic 123456 [id number]  Diastolic 80 99 65  Pulse 69 95 91     Physical Exam Vitals and nursing note reviewed.  Constitutional:      Appearance: Normal appearance. She is well-developed and well-groomed. She is obese.  HENT:     Head: Normocephalic and atraumatic.     Right Ear: External ear normal.     Left Ear: External ear normal.     Nose: Nose normal.     Mouth/Throat:     Mouth: Mucous membranes are moist.     Pharynx: Oropharynx is clear.  Eyes:     General:        Right eye: No discharge.        Left eye: No discharge.     Conjunctiva/sclera: Conjunctivae normal.  Cardiovascular:     Rate and Rhythm: Normal rate. Rhythm irregular.     Pulses: Normal pulses.     Heart sounds: Normal heart sounds.  Pulmonary:     Effort: Pulmonary effort is normal.     Breath sounds: Normal breath sounds.  Musculoskeletal:        General: Normal range of motion.     Cervical back: Normal range of motion and neck supple.  Skin:    General: Skin is warm.  Neurological:     General: No focal deficit present.      Mental Status: She is alert and oriented to person, place, and time.  Psychiatric:        Attention and Perception: Attention normal.        Mood and Affect: Mood normal.        Speech: Speech normal.        Behavior: Behavior normal. Behavior is cooperative.        Thought Content: Thought content normal.        Cognition and Memory: Cognition normal.        Judgment: Judgment normal.      Assessment   1. Encounter for support and coordination of transition of care   2. Thyroid goiter   3. Type 2 diabetes mellitus with other specified complication, without long-term current use of insulin ([place])   4. Essential hypertension   5. Hyperlipidemia LDL goal <70     Tests ordered Orders Placed This Encounter  Procedures  . US THYROID  Plan: Please see assessment and plan per problem list above.   No orders of the defined types were placed in this encounter.   Patient to follow-up in 08/09/2018 .  Perlie Mayo, NP

## 2019-06-16 NOTE — Assessment & Plan Note (Signed)
Hyperlipidemia history.  Encourage low-fat diet and maintenance of Lipitor.

## 2019-06-20 ENCOUNTER — Other Ambulatory Visit: Payer: Self-pay | Admitting: Family Medicine

## 2019-06-22 ENCOUNTER — Other Ambulatory Visit: Payer: Self-pay

## 2019-06-22 ENCOUNTER — Ambulatory Visit (HOSPITAL_COMMUNITY)
Admission: RE | Admit: 2019-06-22 | Discharge: 2019-06-22 | Disposition: A | Discharge status: Home / Self Care | Payer: Medicare Other | Source: Ambulatory Visit | Attending: Family Medicine | Admitting: Family Medicine

## 2019-06-22 DIAGNOSIS — E049 Nontoxic goiter, unspecified: Secondary | ICD-10-CM | POA: Diagnosis not present

## 2019-06-22 DIAGNOSIS — E042 Nontoxic multinodular goiter: Secondary | ICD-10-CM | POA: Diagnosis not present

## 2019-07-13 ENCOUNTER — Other Ambulatory Visit: Payer: Self-pay

## 2019-07-13 ENCOUNTER — Ambulatory Visit (INDEPENDENT_AMBULATORY_CARE_PROVIDER_SITE_OTHER): Payer: Medicare Other | Admitting: *Deleted

## 2019-07-13 DIAGNOSIS — Z5181 Encounter for therapeutic drug level monitoring: Secondary | ICD-10-CM | POA: Diagnosis not present

## 2019-07-13 DIAGNOSIS — I480 Paroxysmal atrial fibrillation: Secondary | ICD-10-CM

## 2019-07-13 LAB — POCT INR: INR: 2.7 (ref 2.0–3.0)

## 2019-07-13 NOTE — Patient Instructions (Signed)
Continue warfarin 1/2 tablet daily except 1 tablet on Mondays, Wednesdays and Fridays Recheck in 6 weeks 

## 2019-08-10 ENCOUNTER — Ambulatory Visit (INDEPENDENT_AMBULATORY_CARE_PROVIDER_SITE_OTHER): Payer: Medicare Other | Admitting: Family Medicine

## 2019-08-10 ENCOUNTER — Encounter: Payer: Self-pay | Admitting: Family Medicine

## 2019-08-10 ENCOUNTER — Other Ambulatory Visit: Payer: Self-pay

## 2019-08-10 VITALS — BP 130/88 | HR 70 | Temp 97.3°F | Resp 15 | Ht 62.5 in | Wt 204.0 lb

## 2019-08-10 DIAGNOSIS — Z1231 Encounter for screening mammogram for malignant neoplasm of breast: Secondary | ICD-10-CM

## 2019-08-10 DIAGNOSIS — Z0000 Encounter for general adult medical examination without abnormal findings: Secondary | ICD-10-CM

## 2019-08-10 DIAGNOSIS — E1169 Type 2 diabetes mellitus with other specified complication: Secondary | ICD-10-CM | POA: Insufficient documentation

## 2019-08-10 DIAGNOSIS — D126 Benign neoplasm of colon, unspecified: Secondary | ICD-10-CM

## 2019-08-10 DIAGNOSIS — E785 Hyperlipidemia, unspecified: Secondary | ICD-10-CM

## 2019-08-10 DIAGNOSIS — E1159 Type 2 diabetes mellitus with other circulatory complications: Secondary | ICD-10-CM | POA: Insufficient documentation

## 2019-08-10 NOTE — Assessment & Plan Note (Signed)
[name] is reminded of the importance of commitment to daily physical activity for 30 minutes or more, as able and the need to limit carbohydrate intake to 30 to 60 grams per meal to help with blood sugar control.   The need to take medication as prescribed, test blood sugar as directed, and to call between visits if there is a concern that blood sugar is uncontrolled is also discussed.   [name] is reminded of the importance of daily foot exam, annual eye examination, and good blood sugar, blood pressure and cholesterol control.  Diabetic Labs Latest Ref Rng & Units 06/03/2019 06/02/2019 06/02/2019 04/04/2019 10/18/2018  HbA1c 4.8 - 5.6 % - - 7.1(H) - 6.5(H)  Microalbumin Not Estab. ug/mL - - - - -  Micro/Creat Ratio 0.0 - 30.0 mg/g creat - - - - -  Chol 0 - 200 mg/dL 139 - - 142 127  HDL >40 mg/dL 26(L) - - 34(L) 32(L)  Calc LDL 0 - 99 mg/dL 97 - - 90 72  Triglycerides <150 mg/dL 79 - - 97 113  Creatinine 0.44 - 1.00 mg/dL 0.70 1.00 0.94 0.84 0.89   BP/Weight 08/10/2019 06/16/2019 06/03/2019 04/04/2019 02/28/2019 01/18/2019 [id number]  Systolic BP Q000111Q 123456 XX123456 0000000 123XX123 123456 123456  Diastolic BP 90 80 99 84 86 60 60  Wt. (Lbs) 204.04 212.04 221.12 219 218 208 208  BMI 36.72 35.29 36.8 37.59 37.42 38.04 38.04   Foot/eye exam completion dates Latest Ref Rng & Units 02/25/2018 12/10/2016  Eye Exam No Retinopathy - -  Foot Form Completion - Done Done      Updated lab needed at/ before next visit.

## 2019-08-10 NOTE — Patient Instructions (Addendum)
F/u re labs 2nd week in April , call if you need me sooner  Please schedule mammogram and eye appointment for patient at checkout if possible  Fasting lipid, cmp and eGFR and HBA1C  March 31 or shortly after  Microalb from office today if possible, if not add to lab order and let pt know  Please start oTC vitamin D3 , 2000 iU once daily   It is important that you exercise regularly at least 30 minutes 5 times a week. If you develop chest pain, have severe difficulty breathing, or feel very tired, stop exercising immediately and seek medical attention    Think about what you will eat, plan ahead. Choose " clean, green, fresh or frozen" over canned, processed or packaged foods which are more sugary, salty and fatty. 70 to 75% of food eaten should be vegetables and fruit. Three meals at set times with snacks allowed between meals, but they must be fruit or vegetables. Aim to eat over a 12 hour period , example 7 am to 7 pm, and STOP after  your last meal of the day. Drink water,generally about 64 ounces per day, no other drink is as healthy. Fruit juice is best enjoyed in a healthy way, by EATING the fruit. Thanks for choosing [facility], we consider it a privelige to serve you.

## 2019-08-10 NOTE — Assessment & Plan Note (Signed)

## 2019-08-10 NOTE — Progress Notes (Signed)
Samantha Vega     MRN: [id number]      DOB: [birth year]  HPI: Patient is in for annual physical exam. No other health concerns are expressed or addressed at the visit. Recent labs, if available are reviewed. Immunization is reviewed , and  updated if needed.   PE: BP 130/88   Pulse 70   Temp (!) 97.3 F (36.3 C) (Temporal)   Resp 15   Ht 5' 2.5" (1.588 m)   Wt 204 lb 0.6 oz (92.6 kg)   SpO2 98%   BMI 36.72 kg/m    Pleasant  female, alert and oriented x 3, in no cardio-pulmonary distress. Afebrile. HEENT No facial trauma or asymetry. Sinuses non tender.  Extra occullar muscles intact.. External ears normal, . Neck: supple, no adenopathy,JVD or thyromegaly.No bruits.  Chest: Clear to ascultation bilaterally.No crackles or wheezes. Non tender to palpation  Breast: No asymetry,no masses or lumps. No tenderness. No nipple discharge or inversion. No axillary or supraclavicular adenopathy  Cardiovascular system; Heart sounds normal,  S1 and  S2 ,no S3.  No murmur, or thrill. Apical beat not displaced Peripheral pulses normal.  Abdomen: Soft, non tender, no organomegaly or masses. No bruits. Bowel sounds normal. No guarding, tenderness or rebound.    Musculoskeletal exam: Full ROM of spine, hips ,  and knees.decreased ROM shoolders No deformity ,swelling or crepitus noted. No muscle wasting or atrophy.   Neurologic: Cranial nerves 2 to 12 intact. Power, tone ,sensation and reflexes normal throughout. No disturbance in gait. No tremor.  Skin: Intact, no ulceration, erythema , scaling or rash noted. Pigmentation normal throughout  Psych; Normal mood and affect. Judgement and concentration normal   Assessment & Plan:  Annual physical exam Annual exam as documented. Counseling done  re healthy lifestyle involving commitment to 150 minutes exercise per week, heart healthy diet, and attaining healthy weight.The importance of adequate sleep also  discussed. Regular seat belt use and home safety, is also discussed. Changes in health habits are decided on by the patient with goals and time frames  set for achieving them. Immunization and cancer screening needs are specifically addressed at this visit.   Type 2 diabetes mellitus with vascular disease ([place]) [name] is reminded of the importance of commitment to daily physical activity for 30 minutes or more, as able and the need to limit carbohydrate intake to 30 to 60 grams per meal to help with blood sugar control.   The need to take medication as prescribed, test blood sugar as directed, and to call between visits if there is a concern that blood sugar is uncontrolled is also discussed.   [name] is reminded of the importance of daily foot exam, annual eye examination, and good blood sugar, blood pressure and cholesterol control.  Diabetic Labs Latest Ref Rng & Units 06/03/2019 06/02/2019 06/02/2019 04/04/2019 10/18/2018  HbA1c 4.8 - 5.6 % - - 7.1(H) - 6.5(H)  Microalbumin Not Estab. ug/mL - - - - -  Micro/Creat Ratio 0.0 - 30.0 mg/g creat - - - - -  Chol 0 - 200 mg/dL 139 - - 142 127  HDL >40 mg/dL 26(L) - - 34(L) 32(L)  Calc LDL 0 - 99 mg/dL 97 - - 90 72  Triglycerides <150 mg/dL 79 - - 97 113  Creatinine 0.44 - 1.00 mg/dL 0.70 1.00 0.94 0.84 0.89   BP/Weight 08/10/2019 06/16/2019 06/03/2019 04/04/2019 02/28/2019 01/18/2019 [id number]  Systolic BP Q000111Q 123456 XX123456 0000000 123XX123 123456 123456  Diastolic  BP 90 80 99 84 86 60 60  Wt. (Lbs) 204.04 212.04 221.12 219 218 208 208  BMI 36.72 35.29 36.8 37.59 37.42 38.04 38.04   Foot/eye exam completion dates Latest Ref Rng & Units 02/25/2018 12/10/2016  Eye Exam No Retinopathy - -  Foot Form Completion - Done Done      Updated lab needed at/ before next visit.

## 2019-08-13 ENCOUNTER — Encounter: Payer: Self-pay | Admitting: Family Medicine

## 2019-08-18 ENCOUNTER — Other Ambulatory Visit: Payer: Self-pay | Admitting: Family Medicine

## 2019-08-24 ENCOUNTER — Other Ambulatory Visit: Payer: Self-pay

## 2019-08-24 ENCOUNTER — Ambulatory Visit (INDEPENDENT_AMBULATORY_CARE_PROVIDER_SITE_OTHER): Payer: Medicare Other | Admitting: *Deleted

## 2019-08-24 DIAGNOSIS — I480 Paroxysmal atrial fibrillation: Secondary | ICD-10-CM | POA: Diagnosis not present

## 2019-08-24 DIAGNOSIS — Z5181 Encounter for therapeutic drug level monitoring: Secondary | ICD-10-CM | POA: Diagnosis not present

## 2019-08-24 LAB — POCT INR: INR: 2.7 (ref 2.0–3.0)

## 2019-08-24 NOTE — Patient Instructions (Signed)
Continue warfarin 1/2 tablet daily except 1 tablet on Mondays, Wednesdays and Fridays Recheck in 6 weeks 

## 2019-09-02 ENCOUNTER — Ambulatory Visit: Payer: Medicare Other | Attending: Internal Medicine

## 2019-09-02 DIAGNOSIS — Z23 Encounter for immunization: Secondary | ICD-10-CM

## 2019-09-02 NOTE — Progress Notes (Signed)
   Covid-19 Vaccination Clinic  Name:  Samantha Vega    MRN: [id number] DOB: Jun 20, 1950  09/02/2019  Samantha Vega was observed post Covid-19 immunization for 15 minutes without incident. She was provided with Vaccine Information Sheet and instruction to access the V-Safe system.   Samantha Vega was instructed to call 911 with any severe reactions post vaccine: Marland Kitchen Difficulty breathing  . Swelling of face and throat  . A fast heartbeat  . A bad rash all over body  . Dizziness and weakness   Immunizations Administered    Name Date Dose VIS Date Route   Moderna COVID-19 Vaccine 09/02/2019  9:34 AM 0.5 mL 05/17/2019 Intramuscular   Manufacturer: Moderna   Lot: [id number]   [id number]

## 2019-09-15 ENCOUNTER — Ambulatory Visit: Payer: Medicare Other | Admitting: Cardiology

## 2019-09-15 NOTE — Progress Notes (Deleted)
Clinical Summary Samantha Vega is a 69 y.o.female seen today for follow up of the following medical problems.   1. Chest pain - history of atypical chest pain -she denies any recent symptoms.  - no recent chest pain   2. Paroxysmal afib - rate control strategy w/ diltiazem and metoprolol  - previously on xarelto however cost was too much, changed to coumadin.   - denies any palpitations - compliant with meds. No bleeding on coumadin.  - has not taken meds yet today  3. HTN  - she is compliant with meds  4. HL  - compliant with statin  10/2018 TC 127 TG 113 HDL 32 LDL 72  5. LE edema -no recent issue. Has prn lasix, takes lasix prn but ends up being daily most weeks   6. Possible TIA - admitted 05/2019 with possible TIA - left-sided upper extremity and facial droop-resolved--- without further recurrence MRI brain without acute findings -CTA neck and head without acute findings Past Medical History:  Diagnosis Date  . Arthritis   . Chest pain    Noncardiac; evaluated and treated in 2008  . Diabetes mellitus   . Enlarged heart   . Hyperlipidemia   . Hypertension   . Obesity   . PAF (paroxysmal atrial fibrillation) ([place])   . Right bundle Alfonso Shackett block    +Palpitations.   EKG 11/2006:  NSR, RBBB, LAFB, markedly delayed R-wave progression, no change; Echocardiogram in 10/2006-suboptimal quality, normal; Event recorder-PVCs, no significant arrhythmias or symptoms ;     No Known Allergies   Current Outpatient Medications  Medication Sig Dispense Refill  . atorvastatin (LIPITOR) 80 MG tablet Take 1 tablet (80 mg total) by mouth daily. 90 tablet 3  . benazepril (LOTENSIN) 20 MG tablet Take 1 tablet (20 mg total) by mouth daily. 30 tablet 4  . Cholecalciferol (VITAMIN D3 PO) Take 1,000 Units by mouth daily.    Marland Kitchen diltiazem (CARDIZEM CD) 120 MG 24 hr capsule Take 1 capsule (120 mg total) by mouth daily. 30 capsule 5  . furosemide (LASIX) 20 MG tablet  TAKE 1 TABLET BY MOUTH EVERY DAY AS NEEDED (Patient taking differently: Take 20 mg by mouth daily as needed for fluid. ) 90 tablet 3  . metFORMIN (GLUCOPHAGE) 500 MG tablet Take 1 tablet (500 mg total) by mouth 2 (two) times daily with a meal. 60 tablet 11  . metoprolol tartrate (LOPRESSOR) 100 MG tablet Take 1 tablet (100 mg total) by mouth 2 (two) times daily. 60 tablet 4  . warfarin (COUMADIN) 5 MG tablet TAKE 1 TABLET DAILY EXCEPT 1/2 TABLET ON SUNDAY AND THURSDAY OR AS DIRECTED BY COUMADIN CLINIC 100 tablet 3   No current facility-administered medications for this visit.     Past Surgical History:  Procedure Laterality Date  . ABDOMINAL HYSTERECTOMY    . BREAST BIOPSY Left    benign  . CATARACT EXTRACTION W/PHACO Right 01/08/2016   Procedure: CATARACT EXTRACTION PHACO AND INTRAOCULAR LENS PLACEMENT (IOC);  Surgeon: Rutherford Guys, MD;  Location: [facility];  Service: Ophthalmology;  Laterality: Right;  CDE: 5.78  . CATARACT EXTRACTION W/PHACO Left 02/05/2016   Procedure: CATARACT EXTRACTION PHACO AND INTRAOCULAR LENS PLACEMENT LEFT EYE CDE=6.50;  Surgeon: Rutherford Guys, MD;  Location: [facility];  Service: Ophthalmology;  Laterality: Left;  . COLONOSCOPY  2006   Dr. Tamala Julian: Normal, repeat in 2009.  Marland Kitchen COLONOSCOPY N/A 10/08/2015   Procedure: COLONOSCOPY;  Surgeon: Danie Binder, MD;  [facility];  Service: Endoscopy;  Laterality: N/A;  230 - moved to 1:30, spoke with pt   . POLYPECTOMY  10/08/2015   Procedure: POLYPECTOMY;  Surgeon: Danie Binder, MD;  [facility];  Service: Endoscopy;;  cecal polyp removed via cold forceps with one clip placed/ rectal polyp removed via cold forcep  . SHOULDER SURGERY  2006   Left-post-trauma  . TOTAL ABDOMINAL HYSTERECTOMY W/ BILATERAL SALPINGOOPHORECTOMY       No Known Allergies    Family History  Problem Relation Age of Onset  . Hypertension Mother   . Arthritis Mother   . Stroke Father   . Hypertension Father   . Coronary  artery disease Father   . Diabetes Sister   . Sarcoidosis Sister   . Diabetes Sister   . Heart failure Brother   . Congestive Heart Failure Brother   . Colon cancer Neg Hx      Social History Samantha Vega reports that she has never smoked. She has never used smokeless tobacco. Samantha Vega reports no history of alcohol use.   Review of Systems CONSTITUTIONAL: No weight loss, fever, chills, weakness or fatigue.  HEENT: Eyes: No visual loss, blurred vision, double vision or yellow sclerae.No hearing loss, sneezing, congestion, runny nose or sore throat.  SKIN: No rash or itching.  CARDIOVASCULAR:  RESPIRATORY: No shortness of breath, cough or sputum.  GASTROINTESTINAL: No anorexia, nausea, vomiting or diarrhea. No abdominal pain or blood.  GENITOURINARY: No burning on urination, no polyuria NEUROLOGICAL: No headache, dizziness, syncope, paralysis, ataxia, numbness or tingling in the extremities. No change in bowel or bladder control.  MUSCULOSKELETAL: No muscle, back pain, joint pain or stiffness.  LYMPHATICS: No enlarged nodes. No history of splenectomy.  PSYCHIATRIC: No history of depression or anxiety.  ENDOCRINOLOGIC: No reports of sweating, cold or heat intolerance. No polyuria or polydipsia.  Marland Kitchen   Physical Examination There were no vitals filed for this visit. There were no vitals filed for this visit.  Gen: resting comfortably, no acute distress HEENT: no scleral icterus, pupils equal round and reactive, no palptable cervical adenopathy,  CV Resp: Clear to auscultation bilaterally GI: abdomen is soft, non-tender, non-distended, normal bowel sounds, no hepatosplenomegaly MSK: extremities are warm, no edema.  Skin: warm, no rash Neuro:  no focal deficits Psych: appropriate affect   Diagnostic Studies 07/12/12 Echo: LVEF 55-60%, mild basal septal hypertrophy,   07/11/12 Carotid US: <50% bilateral stenosis, 3.4 cm thyroid nodule     Assessment and Plan  1. Chest  pain - no recent symptoms, continue to monitor.    2. Parox afib  . CHADS2Vasc score is 3, continue anticoag. DOACs too expensive, - no symptoms, continue current meds  3. HTN:  -at goal, continue current meds  4. HL:  -lipids at goal, continue statin  5. Leg edema - controlled, continue lasix      Arnoldo Lenis, M.D., F.A.C.C.

## 2019-09-16 NOTE — Progress Notes (Signed)
Cardiology Office Note   Date:  09/19/2019   ID:  Samantha Vega, Samantha Vega November 18, 1950, MRN [id number]  PCP:  Fayrene Helper, MD  Cardiologist:  Dr. Harl Bowie    Chief Complaint  Patient presents with  . Atrial Fibrillation      History of Present Illness: [name] is a 69 y.o. female who presents for atrial fib   Hx of chest pain atypical,and Permanent AF rate control on dilt and metoprolol and coumadin. HTN, HLD   Had TIA in Dec 2020- echo at that time with EF 50-55%, borderline LVH, LV with regional wall motion abnormalities.   No chest pain and no SOB.  Is not aware of HR.  Has rec'd 1 of the Moderna vaccines. So has her husband who is with her today for visit.   No bleeding on coumadin.  No lightheadedness or dizziness.  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    She is very tired in the afternoon, her BP is low normal today.  Past Medical History:  Diagnosis Date  . Arthritis   . Chest pain    Noncardiac; evaluated and treated in 2008  . Diabetes mellitus   . Enlarged heart   . Hyperlipidemia   . Hypertension   . Obesity   . PAF (paroxysmal atrial fibrillation) ([place])   . Right bundle branch block    +Palpitations.   EKG 11/2006:  NSR, RBBB, LAFB, markedly delayed R-wave progression, no change; Echocardiogram in 10/2006-suboptimal quality, normal; Event recorder-PVCs, no significant arrhythmias or symptoms ;    Past Surgical History:  Procedure Laterality Date  . ABDOMINAL HYSTERECTOMY    . BREAST BIOPSY Left    benign  . CATARACT EXTRACTION W/PHACO Right 01/08/2016   Procedure: CATARACT EXTRACTION PHACO AND INTRAOCULAR LENS PLACEMENT (IOC);  Surgeon: Rutherford Guys, MD;  Location: [facility];  Service: Ophthalmology;  Laterality: Right;  CDE: 5.78  . CATARACT EXTRACTION W/PHACO Left 02/05/2016   Procedure: CATARACT EXTRACTION PHACO AND INTRAOCULAR LENS PLACEMENT LEFT EYE CDE=6.50;  Surgeon: Rutherford Guys, MD;  Location: [facility];  Service: Ophthalmology;   Laterality: Left;  . COLONOSCOPY  2006   Dr. Tamala Julian: Normal, repeat in 2009.  Marland Kitchen COLONOSCOPY N/A 10/08/2015   Procedure: COLONOSCOPY;  Surgeon: Danie Binder, MD;  [facility];  Service: Endoscopy;  Laterality: N/A;  230 - moved to 1:30, spoke with pt   . POLYPECTOMY  10/08/2015   Procedure: POLYPECTOMY;  Surgeon: Danie Binder, MD;  [facility];  Service: Endoscopy;;  cecal polyp removed via cold forceps with one clip placed/ rectal polyp removed via cold forcep  . SHOULDER SURGERY  2006   Left-post-trauma  . TOTAL ABDOMINAL HYSTERECTOMY W/ BILATERAL SALPINGOOPHORECTOMY       Current Outpatient Medications  Medication Sig Dispense Refill  . atorvastatin (LIPITOR) 80 MG tablet Take 1 tablet (80 mg total) by mouth daily. 90 tablet 3  . benazepril (LOTENSIN) 20 MG tablet Take 1 tablet (20 mg total) by mouth daily. 30 tablet 4  . Cholecalciferol (VITAMIN D3 PO) Take 1,000 Units by mouth daily.    Marland Kitchen diltiazem (CARDIZEM CD) 120 MG 24 hr capsule Take 1 capsule (120 mg total) by mouth daily. 30 capsule 5  . furosemide (LASIX) 20 MG tablet TAKE 1 TABLET BY MOUTH EVERY DAY AS NEEDED (Patient taking differently: Take 20 mg by mouth daily as needed for fluid. ) 90 tablet 3  . metFORMIN (GLUCOPHAGE) 500 MG tablet Take  1 tablet (500 mg total) by mouth 2 (two) times daily with a meal. 60 tablet 11  . metoprolol tartrate (LOPRESSOR) 100 MG tablet Take 1 tablet (100 mg total) by mouth 2 (two) times daily. 60 tablet 4  . warfarin (COUMADIN) 5 MG tablet TAKE 1 TABLET DAILY EXCEPT 1/2 TABLET ON SUNDAY AND THURSDAY OR AS DIRECTED BY COUMADIN CLINIC 100 tablet 3   No current facility-administered medications for this visit.    Allergies:   Patient has no known allergies.    Social History:  The patient  reports that she has never smoked. She has never used smokeless tobacco. She reports that she does not drink alcohol or use drugs.   Family History:  The patient's family history  includes Arthritis in her mother; Congestive Heart Failure in her brother; Coronary artery disease in her father; Diabetes in her sister and sister; Heart failure in her brother; Hypertension in her father and mother; Sarcoidosis in her sister; Stroke in her father.    ROS:  General:no colds or fevers, some weight loss Skin:no rashes or ulcers HEENT:no blurred vision, no congestion CV:see HPI PUL:see HPI GI:no diarrhea constipation or melena, no indigestion GU:no hematuria, no dysuria MS:no joint pain, no claudication Neuro:no syncope, no lightheadedness Endo:+ diabetes does not check her glucose but tells me it has been doing well., no thyroid disease  Wt Readings from Last 3 Encounters:  09/19/19 203 lb (92.1 kg)  08/10/19 204 lb 0.6 oz (92.6 kg)  06/16/19 212 lb 0.6 oz (96.2 kg)     PHYSICAL EXAM: VS:  BP 114/84   Pulse 94   Temp (!) 96.2 F (35.7 C)   Ht 5\' 2"  (1.575 m)   Wt 203 lb (92.1 kg)   SpO2 95%   BMI 37.13 kg/m  , BMI Body mass index is 37.13 kg/m. General:Pleasant affect, NAD Skin:Warm and dry, brisk capillary refill HEENT:normocephalic, sclera clear, mucus membranes moist Neck:supple, no JVD, no bruits  Heart:irreg irreg without murmur, gallup, rub or click Lungs:clear without rales, rhonchi, or wheezes [id number], non tender, + BS, do not palpate liver spleen or masses Ext:no lower ext edema, 2+ pedal pulses, 2+ radial pulses Neuro:alert and oriented X 3, MAE, follows commands, + facial symmetry    EKG:  EKG is NOT ordered today. The ekg ordered today demonstrates    Recent Labs: 10/18/2018: TSH 1.540 06/03/2019: ALT 50; BUN 11; Creatinine, Ser 0.70; Hemoglobin 13.0; Magnesium 1.9; Platelets 195; Potassium 3.8; Sodium 137    Lipid Panel    Component Value Date/Time   CHOL 139 06/03/2019 0501   CHOL 142 04/04/2019 0924   TRIG 79 06/03/2019 0501   HDL 26 (L) 06/03/2019 0501   HDL 34 (L) 04/04/2019 0924   CHOLHDL 5.3 06/03/2019 0501   VLDL 16  06/03/2019 0501   LDLCALC 97 06/03/2019 0501   LDLCALC 90 04/04/2019 0924   LDLCALC 85 02/25/2018 0847       Other studies Reviewed: Additional studies/ records that were reviewed today include: . Echo 06/03/19 1. Left ventricular ejection fraction, by visual estimation, is 50 to  55%. The left ventricle has low normal function. There is borderline left  ventricular hypertrophy.  2. Apical lateral segment, apical anterior segment, apical inferior  segment, and apex are abnormal.  3. Left ventricular diastolic parameters are indeterminate in the setting  of atrial fibrillation.  4. The left ventricle demonstrates regional wall motion abnormalities.  5. Global right ventricle has low normal systolic function.The right  ventricular size is normal. No increase in right ventricular wall  thickness.  6. Left atrial size was normal.  7. Right atrial size was normal.  8. Mild mitral annular calcification.  9. The mitral valve is grossly normal. Trivial mitral valve  regurgitation.  10. The tricuspid valve is grossly normal. Tricuspid valve regurgitation  is trivial.  11. The aortic valve is tricuspid. Aortic valve regurgitation is not  visualized.  12. The pulmonic valve was grossly normal. Pulmonic valve regurgitation is  trivial.  13. TR signal is inadequate for assessing pulmonary artery systolic  pressure.  14. The inferior vena cava is normal in size with greater than 50%  respiratory variability, suggesting right atrial pressure of 3 mmHg.  15. No obvious right to left interatrial shunting by saline contrast.   FINDINGS  Left Ventricle: Left ventricular ejection fraction, by visual estimation,  is 50 to 55%. The left ventricle has low normal function. The left  ventricle demonstrates regional wall motion abnormalities. The left  ventricular internal cavity size was the  left ventricle is normal in size. There is borderline left ventricular  hypertrophy. Left  ventricular diastolic parameters are indeterminate.     LV Wall Scoring:  The apical lateral segment, apical anterior segment, apical inferior  segment,  and apex are hypokinetic. All remaining scored segments are normal.   Right Ventricle: The right ventricular size is normal. No increase in  right ventricular wall thickness. Global RV systolic function is has low  normal systolic function.   Left Atrium: Left atrial size was normal in size.   Right Atrium: Right atrial size was normal in size   Pericardium: There is no evidence of pericardial effusion.   Mitral Valve: The mitral valve is grossly normal. Mild mitral annular  calcification. Trivial mitral valve regurgitation.   Tricuspid Valve: The tricuspid valve is grossly normal. Tricuspid valve  regurgitation is trivial.   Aortic Valve: The aortic valve is tricuspid. Aortic valve regurgitation is  not visualized.   Pulmonic Valve: The pulmonic valve was grossly normal. Pulmonic valve  regurgitation is trivial. Pulmonic regurgitation is trivial.   Aorta: The aortic root is normal in size and structure.   Venous: The inferior vena cava is normal in size with greater than 50%  respiratory variability, suggesting right atrial pressure of 3 mmHg.   IAS/Shunts: No atrial level shunt detected by color flow Doppler. Agitated  saline contrast was given intravenously to evaluate for intracardiac  shunting. Saline contrast bubble study was negative, with no evidence of  any interatrial shunt.  ASSESSMENT AND PLAN:  1.  Atrial fib, permanent and stable.  No awareness of HR 2.  anticoagulation on coumadin continue followed through out office.  3.  fatigue in afternoon, may be lower BP she will decrease losartan to 10 mg daily 4.  HTN well controlled, may be too low in evening will decrease as above 5.  HLD on statin. Continue, reviewed PCP notes and hospital notes for TIA 6.  DM-2 per PCP 7.  Has not had covid, they both have  been careful.  Has had first vaccine.     Current medicines are reviewed with the patient today.  The patient Has no concerns regarding medicines.  The following changes have been made:  See above Labs/ tests ordered today include:see above  Disposition:   FU:  see above  Signed, Cecilie Kicks, NP  09/19/2019 1:23 PM    [address],  [place], [place] Phone: [phone]; Fax: [phone]

## 2019-09-19 ENCOUNTER — Other Ambulatory Visit: Payer: Self-pay

## 2019-09-19 ENCOUNTER — Ambulatory Visit (INDEPENDENT_AMBULATORY_CARE_PROVIDER_SITE_OTHER): Payer: Medicare Other | Admitting: Cardiology

## 2019-09-19 ENCOUNTER — Encounter: Payer: Self-pay | Admitting: Cardiology

## 2019-09-19 VITALS — BP 114/84 | HR 94 | Temp 96.2°F | Ht 62.0 in | Wt 203.0 lb

## 2019-09-19 DIAGNOSIS — E1159 Type 2 diabetes mellitus with other circulatory complications: Secondary | ICD-10-CM | POA: Diagnosis not present

## 2019-09-19 DIAGNOSIS — Z7901 Long term (current) use of anticoagulants: Secondary | ICD-10-CM | POA: Diagnosis not present

## 2019-09-19 DIAGNOSIS — I10 Essential (primary) hypertension: Secondary | ICD-10-CM | POA: Diagnosis not present

## 2019-09-19 DIAGNOSIS — E782 Mixed hyperlipidemia: Secondary | ICD-10-CM | POA: Diagnosis not present

## 2019-09-19 DIAGNOSIS — I4821 Permanent atrial fibrillation: Secondary | ICD-10-CM | POA: Diagnosis not present

## 2019-09-19 NOTE — Patient Instructions (Addendum)
Medication Instructions:  Your physician recommends that you continue on your current medications as directed. Please refer to the Current Medication list given to you today.  Take 1/2 Benazepril  Daily  *If you need a refill on your cardiac medications before your next appointment, please call your pharmacy*   Lab Work: NONE  If you have labs (blood work) drawn today and your tests are completely normal, you will receive your results only by: Marland Kitchen MyChart Message (if you have MyChart) OR . A paper copy in the mail If you have any lab test that is abnormal or we need to change your treatment, we will call you to review the results.   Testing/Procedures: NONE    Follow-Up: At [facility], you and your health needs are our priority.  As part of our continuing mission to provide you with exceptional heart care, we have created designated Provider Care Teams.  These Care Teams include your primary Cardiologist (physician) and Advanced Practice Providers (APPs -  Physician Assistants and Nurse Practitioners) who all work together to provide you with the care you need, when you need it.  We recommend signing up for the patient portal called "MyChart".  Sign up information is provided on this After Visit Summary.  MyChart is used to connect with patients for Virtual Visits (Telemedicine).  Patients are able to view lab/test results, encounter notes, upcoming appointments, etc.  Non-urgent messages can be sent to your provider as well.   To learn more about what you can do with MyChart, go to [email or web address].    Your next appointment:   1 year(s)  The format for your next appointment:   In Person  Provider:   Carlyle Dolly, MD   Other Instructions Thank you for choosing [facility]!

## 2019-09-20 ENCOUNTER — Other Ambulatory Visit: Payer: Self-pay | Admitting: Family Medicine

## 2019-09-20 DIAGNOSIS — E785 Hyperlipidemia, unspecified: Secondary | ICD-10-CM | POA: Diagnosis not present

## 2019-09-20 DIAGNOSIS — E1159 Type 2 diabetes mellitus with other circulatory complications: Secondary | ICD-10-CM | POA: Diagnosis not present

## 2019-09-22 LAB — COMPREHENSIVE METABOLIC PANEL
ALT: 33 IU/L — ABNORMAL HIGH (ref 0–32)
AST: 33 IU/L (ref 0–40)
Albumin/Globulin Ratio: 1.3 (ref 1.2–2.2)
Albumin: 3.9 g/dL (ref 3.8–4.8)
Alkaline Phosphatase: 125 IU/L — ABNORMAL HIGH (ref 39–117)
BUN/Creatinine Ratio: 19 (ref 12–28)
BUN: 18 mg/dL (ref 8–27)
Bilirubin Total: 0.4 mg/dL (ref 0.0–1.2)
CO2: 29 mmol/L (ref 20–29)
Calcium: 9.4 mg/dL (ref 8.7–10.3)
Chloride: 100 mmol/L (ref 96–106)
Creatinine, Ser: 0.95 mg/dL (ref 0.57–1.00)
GFR calc Af Amer: 70 mL/min/{1.73_m2} (ref >59)
GFR calc non Af Amer: 61 mL/min/{1.73_m2} (ref >59)
Globulin, Total: 3.1 g/dL (ref 1.5–4.5)
Glucose: 127 mg/dL — ABNORMAL HIGH (ref 65–99)
Potassium: 3.8 mmol/L (ref 3.5–5.2)
Sodium: 141 mmol/L (ref 134–144)
Total Protein: 7 g/dL (ref 6.0–8.5)

## 2019-09-22 LAB — MICROALBUMIN / CREATININE URINE RATIO
Creatinine, Urine: 62 mg/dL
Microalb/Creat Ratio: 12 mg/g creat (ref 0–29)
Microalbumin, Urine: 7.7 ug/mL

## 2019-09-22 LAB — LIPID PANEL W/O CHOL/HDL RATIO
Cholesterol, Total: 137 mg/dL (ref 100–199)
HDL: 33 mg/dL — ABNORMAL LOW (ref >39)
LDL Chol Calc (NIH): 78 mg/dL (ref 0–99)
Triglycerides: 147 mg/dL (ref 0–149)
VLDL Cholesterol Cal: 26 mg/dL (ref 5–40)

## 2019-09-22 LAB — SPECIMEN STATUS REPORT

## 2019-09-22 LAB — HGB A1C W/O EAG: Hgb A1c MFr Bld: 6.3 % — ABNORMAL HIGH (ref 4.8–5.6)

## 2019-09-26 ENCOUNTER — Ambulatory Visit: Payer: Medicare Other | Admitting: Family Medicine

## 2019-10-03 ENCOUNTER — Other Ambulatory Visit: Payer: Self-pay | Admitting: Cardiology

## 2019-10-05 ENCOUNTER — Ambulatory Visit: Payer: Medicare Other | Attending: Internal Medicine

## 2019-10-05 DIAGNOSIS — Z23 Encounter for immunization: Secondary | ICD-10-CM

## 2019-10-05 NOTE — Progress Notes (Signed)
   Covid-19 Vaccination Clinic  Name:  Samantha Vega    MRN: [id number] DOB: 09-09-1950  10/05/2019  Ms. [name] was observed post Covid-19 immunization for 15 minutes without incident. She was provided with Vaccine Information Sheet and instruction to access the V-Safe system.   Ms. [name] was instructed to call 911 with any severe reactions post vaccine: Marland Kitchen Difficulty breathing  . Swelling of face and throat  . A fast heartbeat  . A bad rash all over body  . Dizziness and weakness   Immunizations Administered    Name Date Dose VIS Date Route   Moderna COVID-19 Vaccine 10/05/2019  8:15 AM 0.5 mL 05/2019 Intramuscular   Manufacturer: Moderna   Lot: [id number]   [id number]

## 2019-10-10 ENCOUNTER — Other Ambulatory Visit: Payer: Self-pay

## 2019-10-10 ENCOUNTER — Ambulatory Visit (INDEPENDENT_AMBULATORY_CARE_PROVIDER_SITE_OTHER): Payer: Medicare Other | Admitting: *Deleted

## 2019-10-10 DIAGNOSIS — Z5181 Encounter for therapeutic drug level monitoring: Secondary | ICD-10-CM | POA: Diagnosis not present

## 2019-10-10 DIAGNOSIS — I480 Paroxysmal atrial fibrillation: Secondary | ICD-10-CM | POA: Diagnosis not present

## 2019-10-10 LAB — POCT INR: INR: 2 (ref 2.0–3.0)

## 2019-10-10 NOTE — Patient Instructions (Signed)
Continue warfarin 1/2 tablet daily except 1 tablet on Mondays, Wednesdays and Fridays Recheck in 6 weeks 

## 2019-10-12 ENCOUNTER — Ambulatory Visit: Payer: Medicare Other | Admitting: Family Medicine

## 2019-10-14 ENCOUNTER — Other Ambulatory Visit: Payer: Self-pay

## 2019-10-14 ENCOUNTER — Ambulatory Visit (INDEPENDENT_AMBULATORY_CARE_PROVIDER_SITE_OTHER): Payer: Medicare Other | Admitting: Family Medicine

## 2019-10-14 ENCOUNTER — Encounter: Payer: Self-pay | Admitting: Family Medicine

## 2019-10-14 ENCOUNTER — Encounter (HOSPITAL_COMMUNITY): Payer: Self-pay | Admitting: Emergency Medicine

## 2019-10-14 ENCOUNTER — Emergency Department (HOSPITAL_COMMUNITY): Payer: Medicare Other

## 2019-10-14 ENCOUNTER — Emergency Department (HOSPITAL_COMMUNITY)
Admission: EM | Admit: 2019-10-14 | Discharge: 2019-10-14 | Disposition: A | Discharge status: Home / Self Care | Payer: Medicare Other | Attending: Emergency Medicine | Admitting: Emergency Medicine

## 2019-10-14 VITALS — BP 132/83 | HR 71 | Temp 98.2°F | Ht 62.0 in | Wt 201.0 lb

## 2019-10-14 DIAGNOSIS — Z7901 Long term (current) use of anticoagulants: Secondary | ICD-10-CM | POA: Diagnosis not present

## 2019-10-14 DIAGNOSIS — I4891 Unspecified atrial fibrillation: Secondary | ICD-10-CM | POA: Diagnosis not present

## 2019-10-14 DIAGNOSIS — I110 Hypertensive heart disease with heart failure: Secondary | ICD-10-CM | POA: Diagnosis not present

## 2019-10-14 DIAGNOSIS — R0789 Other chest pain: Secondary | ICD-10-CM | POA: Diagnosis not present

## 2019-10-14 DIAGNOSIS — I509 Heart failure, unspecified: Secondary | ICD-10-CM | POA: Insufficient documentation

## 2019-10-14 DIAGNOSIS — I480 Paroxysmal atrial fibrillation: Secondary | ICD-10-CM

## 2019-10-14 DIAGNOSIS — Z8673 Personal history of transient ischemic attack (TIA), and cerebral infarction without residual deficits: Secondary | ICD-10-CM | POA: Insufficient documentation

## 2019-10-14 DIAGNOSIS — Z7984 Long term (current) use of oral hypoglycemic drugs: Secondary | ICD-10-CM | POA: Insufficient documentation

## 2019-10-14 DIAGNOSIS — E1159 Type 2 diabetes mellitus with other circulatory complications: Secondary | ICD-10-CM | POA: Diagnosis not present

## 2019-10-14 DIAGNOSIS — R0602 Shortness of breath: Secondary | ICD-10-CM

## 2019-10-14 DIAGNOSIS — I4510 Unspecified right bundle-branch block: Secondary | ICD-10-CM

## 2019-10-14 DIAGNOSIS — Z79899 Other long term (current) drug therapy: Secondary | ICD-10-CM | POA: Diagnosis not present

## 2019-10-14 DIAGNOSIS — E119 Type 2 diabetes mellitus without complications: Secondary | ICD-10-CM | POA: Diagnosis not present

## 2019-10-14 DIAGNOSIS — R0600 Dyspnea, unspecified: Secondary | ICD-10-CM | POA: Diagnosis not present

## 2019-10-14 DIAGNOSIS — R0609 Other forms of dyspnea: Secondary | ICD-10-CM

## 2019-10-14 DIAGNOSIS — R079 Chest pain, unspecified: Secondary | ICD-10-CM | POA: Diagnosis not present

## 2019-10-14 HISTORY — DX: Dyspnea, unspecified: R06.00

## 2019-10-14 HISTORY — DX: Other forms of dyspnea: R06.09

## 2019-10-14 LAB — CBC
HCT: 38.9 % (ref 36.0–46.0)
Hemoglobin: 12.4 g/dL (ref 12.0–15.0)
MCH: 28.5 pg (ref 26.0–34.0)
MCHC: 31.9 g/dL (ref 30.0–36.0)
MCV: 89.4 fL (ref 80.0–100.0)
Platelets: 188 10*3/uL (ref 150–400)
RBC: 4.35 MIL/uL (ref 3.87–5.11)
RDW: 15.5 % (ref 11.5–15.5)
WBC: 4.2 10*3/uL (ref 4.0–10.5)
nRBC: 0 % (ref 0.0–0.2)

## 2019-10-14 LAB — BASIC METABOLIC PANEL
Anion gap: 8 (ref 5–15)
BUN: 21 mg/dL (ref 8–23)
CO2: 26 mmol/L (ref 22–32)
Calcium: 9 mg/dL (ref 8.9–10.3)
Chloride: 106 mmol/L (ref 98–111)
Creatinine, Ser: 0.87 mg/dL (ref 0.44–1.00)
GFR calc Af Amer: >60 mL/min (ref >60)
GFR calc non Af Amer: >60 mL/min (ref >60)
Glucose, Bld: 98 mg/dL (ref 70–99)
Potassium: 3.7 mmol/L (ref 3.5–5.1)
Sodium: 140 mmol/L (ref 135–145)

## 2019-10-14 LAB — BRAIN NATRIURETIC PEPTIDE: B Natriuretic Peptide: 227 pg/mL — ABNORMAL HIGH (ref 0.0–100.0)

## 2019-10-14 LAB — PROTIME-INR
INR: 2.2 — ABNORMAL HIGH (ref 0.8–1.2)
Prothrombin Time: 23.8 seconds — ABNORMAL HIGH (ref 11.4–15.2)

## 2019-10-14 LAB — TROPONIN I (HIGH SENSITIVITY)
Troponin I (High Sensitivity): 3 ng/L (ref <18)
Troponin I (High Sensitivity): 3 ng/L (ref <18)

## 2019-10-14 MED ORDER — FUROSEMIDE 10 MG/ML IJ SOLN
40.0000 mg | Freq: Once | INTRAMUSCULAR | Status: AC
  Administered 2019-10-14: 40 mg via INTRAVENOUS
  Filled 2019-10-14: qty 4

## 2019-10-14 MED ORDER — DILTIAZEM HCL ER COATED BEADS 120 MG PO CP24
120.0000 mg | ORAL_CAPSULE | Freq: Every day | ORAL | Status: DC
  Administered 2019-10-14: 120 mg via ORAL
  Filled 2019-10-14 (×4): qty 1

## 2019-10-14 MED ORDER — METOPROLOL TARTRATE 50 MG PO TABS
100.0000 mg | ORAL_TABLET | Freq: Two times a day (BID) | ORAL | Status: DC
  Administered 2019-10-14: 12:00:00 100 mg via ORAL
  Filled 2019-10-14 (×2): qty 2

## 2019-10-14 NOTE — Patient Instructions (Addendum)
I appreciate the opportunity to provide you with care for your health and wellness. Today we discussed: shortness of breath with dyspnea   Follow up: as scheduled   ED today as per Cardiology as well  Please continue to practice social distancing to keep you, your family, and our community safe.  If you must go out, please wear a mask and practice good handwashing.  It was a pleasure to see you and I look forward to continuing to work together on your health and well-being. Please do not hesitate to call the office if you need care or have questions about your care.  Have a wonderful day and week. With Gratitude, Cherly Beach, DNP, AGNP-BC

## 2019-10-14 NOTE — ED Provider Notes (Signed)
[facility] Provider Note   CSN: [id number] Arrival date & time: 10/14/19  [id number]     History Chief Complaint  Patient presents with  . Shortness of Breath    Samantha Vega is a 68 y.o. female.  She is complaining of shortness of breath that started yesterday.  Happens with exertion.  Denies any cough nausea vomiting diarrhea dizziness lightheadedness or syncope.  She does not know if she is in A. fib.  He saw her primary care doctor who sent her over here for evaluation.  The history is provided by the patient and the spouse.  Shortness of Breath Severity:  Moderate Onset quality:  Gradual Duration:  2 days Timing:  Intermittent Progression:  Unchanged Chronicity:  New Context: activity   Relieved by:  None tried Worsened by:  Activity Ineffective treatments:  None tried Associated symptoms: chest pain (sometimes)   Associated symptoms: no abdominal pain, no cough, no fever, no headaches, no hemoptysis, no neck pain, no rash, no sore throat, no sputum production, no vomiting and no wheezing        Past Medical History:  Diagnosis Date  . Arthritis   . Chest pain    Noncardiac; evaluated and treated in 2008  . Diabetes mellitus   . Enlarged heart   . Hyperlipidemia   . Hypertension   . Obesity   . PAF (paroxysmal atrial fibrillation) ([place])   . Right bundle branch block    +Palpitations.   EKG 11/2006:  NSR, RBBB, LAFB, markedly delayed R-wave progression, no change; Echocardiogram in 10/2006-suboptimal quality, normal; Event recorder-PVCs, no significant arrhythmias or symptoms ;    Patient Active Problem List   Diagnosis Date Noted  . Dyspnea on exertion 10/14/2019  . Type 2 diabetes mellitus with vascular disease ([place]) 08/10/2019  . Thyroid goiter 06/16/2019  . TIA (transient ischemic attack) 06/02/2019  . Tubular adenoma of colon 10/09/2015  . Annual physical exam 06/07/2015  . Baker's cyst of knee 10/04/2013  . Metabolic syndrome X  [id number]  . Chronic anticoagulation 09/16/2012  . Paroxysmal atrial fibrillation ([place]) 05/22/2012  . OA (osteoarthritis) of knee 05/05/2012  . RBBB (right bundle branch block)   . Morbid obesity ([place])   . Unspecified visual loss 12/08/2008  . Essential hypertension 12/08/2008  . Hyperlipidemia LDL goal <70 12/06/2007    Past Surgical History:  Procedure Laterality Date  . ABDOMINAL HYSTERECTOMY    . BREAST BIOPSY Left    benign  . CATARACT EXTRACTION W/PHACO Right 01/08/2016   Procedure: CATARACT EXTRACTION PHACO AND INTRAOCULAR LENS PLACEMENT (IOC);  Surgeon: Rutherford Guys, MD;  Location: [facility];  Service: Ophthalmology;  Laterality: Right;  CDE: 5.78  . CATARACT EXTRACTION W/PHACO Left 02/05/2016   Procedure: CATARACT EXTRACTION PHACO AND INTRAOCULAR LENS PLACEMENT LEFT EYE CDE=6.50;  Surgeon: Rutherford Guys, MD;  Location: [facility];  Service: Ophthalmology;  Laterality: Left;  . COLONOSCOPY  2006   Dr. Tamala Julian: Normal, repeat in 2009.  Marland Kitchen COLONOSCOPY N/A 10/08/2015   Procedure: COLONOSCOPY;  Surgeon: Danie Binder, MD;  Location: [facility];  Service: Endoscopy;  Laterality: N/A;  230 - moved to 1:30, spoke with pt   . POLYPECTOMY  10/08/2015   Procedure: POLYPECTOMY;  Surgeon: Danie Binder, MD;  Location: [facility];  Service: Endoscopy;;  cecal polyp removed via cold forceps with one clip placed/ rectal polyp removed via cold forcep  . SHOULDER SURGERY  2006   Left-post-trauma  . TOTAL ABDOMINAL HYSTERECTOMY W/  BILATERAL SALPINGOOPHORECTOMY       OB History   No obstetric history on file.     Family History  Problem Relation Age of Onset  . Hypertension Mother   . Arthritis Mother   . Stroke Father   . Hypertension Father   . Coronary artery disease Father   . Diabetes Sister   . Sarcoidosis Sister   . Diabetes Sister   . Heart failure Brother   . Congestive Heart Failure Brother   . Colon cancer Neg Hx     Social History   Tobacco Use  . Smoking status:  Never Smoker  . Smokeless tobacco: Never Used  Substance Use Topics  . Alcohol use: No  . Drug use: No    Home Medications Prior to Admission medications   Medication Sig Start Date End Date Taking? Authorizing Provider  atorvastatin (LIPITOR) 80 MG tablet Take 1 tablet (80 mg total) by mouth daily. 06/03/19   Roxan Hockey, MD  benazepril (LOTENSIN) 20 MG tablet Take 1 tablet (20 mg total) by mouth daily. 06/03/19   Roxan Hockey, MD  Cholecalciferol (VITAMIN D3 PO) Take 1,000 Units by mouth daily.    [provider]  diltiazem (CARDIZEM CD) 120 MG 24 hr capsule Take 1 capsule (120 mg total) by mouth daily. 06/03/19   Roxan Hockey, MD  furosemide (LASIX) 20 MG tablet Take 1 tablet (20 mg total) by mouth daily as needed for fluid. 10/03/19   Arnoldo Lenis, MD  metFORMIN (GLUCOPHAGE) 500 MG tablet Take 1 tablet (500 mg total) by mouth 2 (two) times daily with a meal. 06/03/19 06/02/20  Roxan Hockey, MD  metoprolol tartrate (LOPRESSOR) 100 MG tablet Take 1 tablet (100 mg total) by mouth 2 (two) times daily. 06/03/19   Roxan Hockey, MD  warfarin (COUMADIN) 5 MG tablet TAKE 1 TABLET DAILY EXCEPT 1/2 TABLET ON SUNDAY AND THURSDAY OR AS DIRECTED BY COUMADIN CLINIC 08/18/19   Fayrene Helper, MD    Allergies    Patient has no known allergies.  Review of Systems   Review of Systems  Constitutional: Negative for fever.  HENT: Negative for sore throat.   Eyes: Negative for visual disturbance.  Respiratory: Positive for shortness of breath. Negative for cough, hemoptysis, sputum production and wheezing.   Cardiovascular: Positive for chest pain (sometimes) and leg swelling.  Gastrointestinal: Negative for abdominal pain and vomiting.  Genitourinary: Negative for dysuria.  Musculoskeletal: Negative for neck pain.  Skin: Negative for rash.  Neurological: Negative for headaches.    Physical Exam Updated Vital Signs BP 133/82 (BP Location: Right Arm)   Pulse  63   Temp 98.5 F (36.9 C) (Oral)   Resp 18   Ht 5\' 2"  (1.575 m)   Wt 91 kg   SpO2 100%   BMI 36.69 kg/m   Physical Exam Vitals and nursing note reviewed.  Constitutional:      General: She is not in acute distress.    Appearance: She is well-developed.  HENT:     Head: Normocephalic and atraumatic.  Eyes:     Conjunctiva/sclera: Conjunctivae normal.  Cardiovascular:     Rate and Rhythm: Normal rate. Rhythm irregular.     Heart sounds: No murmur.  Pulmonary:     Effort: Pulmonary effort is normal. No respiratory distress.     Breath sounds: Normal breath sounds.  Abdominal:     Palpations: Abdomen is soft.     Tenderness: There is no abdominal tenderness.  Musculoskeletal:  General: Normal range of motion.     Cervical back: Neck supple.     Right lower leg: No tenderness.     Left lower leg: No tenderness.  Skin:    General: Skin is warm and dry.     Capillary Refill: Capillary refill takes less than 2 seconds.  Neurological:     General: No focal deficit present.     Mental Status: She is alert.     ED Results / Procedures / Treatments   Labs (all labs ordered are listed, but only abnormal results are displayed) Labs Reviewed  BRAIN NATRIURETIC PEPTIDE - Abnormal; Notable for the following components:      Result Value   B Natriuretic Peptide 227.0 (*)    All other components within normal limits  PROTIME-INR - Abnormal; Notable for the following components:   Prothrombin Time 23.8 (*)    INR 2.2 (*)    All other components within normal limits  BASIC METABOLIC PANEL  CBC  TROPONIN I (HIGH SENSITIVITY)  TROPONIN I (HIGH SENSITIVITY)    EKG EKG Interpretation  Date/Time:  Friday October 14 2019 09:16:52 EDT Ventricular Rate:  118 PR Interval:    QRS Duration: 134 QT Interval:  376 QTC Calculation: 527 R Axis:   -61 Text Interpretation: Atrial fibrillation with rapid ventricular response with premature ventricular or aberrantly conducted  complexes Left axis deviation Right bundle branch block Inferior infarct , age undetermined Anterolateral infarct , age undetermined Abnormal ECG simiilar to prior today Confirmed by Aletta Edouard [phone]) on 10/14/2019 9:30:22 AM   Radiology DG Chest 2 View  Result Date: 10/14/2019 CLINICAL DATA:  Chest pain and shortness of breath EXAM: CHEST - 2 VIEW COMPARISON:  February 14, 2018. FINDINGS: The lungs are clear. Heart is borderline enlarged with pulmonary vascularity normal. No adenopathy. No pneumothorax no bone lesions. IMPRESSION: Borderline cardiac enlargement.  Lungs clear. Electronically Signed   By: Lowella Grip III M.D.   On: 10/14/2019 09:40    Procedures Procedures (including critical care time)  Medications Ordered in ED Medications  metoprolol tartrate (LOPRESSOR) tablet 100 mg (100 mg Oral Given 10/14/19 1210)  diltiazem (CARDIZEM CD) 24 hr capsule 120 mg (120 mg Oral Given 10/14/19 1332)  furosemide (LASIX) injection 40 mg (40 mg Intravenous Given 10/14/19 1210)    ED Course  I have reviewed the triage vital signs and the nursing notes.  Pertinent labs & imaging results that were available during my care of the patient were reviewed by me and considered in my medical decision making (see chart for details).  Clinical Course as of Oct 13 1924  Fri Oct 14, 2019  1356 Patient received Lasix and diuresed a lot.  She says her breathing feels better.  We will do a trending pulse ox.   [MB]  [id number] Patient ambulated well off of oxygen. She feels okay for discharge.   [MB]    Clinical Course User Index [MB] Hayden Rasmussen, MD   MDM Rules/Calculators/A&P                     This patient complains of increased shortness of breath and dyspnea on exertion; this involves an extensive number of treatment Options and is a complaint that carries with it a high risk of complications and Morbidity. The differential includes CHF, COPD, pneumonia, pneumothorax, ACS, metabolic  derangement, anemia  I ordered, reviewed and interpreted labs, which included CBC with normal white count normal hemoglobin, chemistries with normal  chemistries normal renal function, therapeutic at 2.2, troponin 3, normal, BNP elevated to 27 no priors to compare with I ordered medication Lasix 40 mg IV with good diuresis I ordered imaging studies which included chest x-ray and I independently    visualized and interpreted imaging which showed cardiomegaly no gross infiltrates Additional history obtained from patient's husband Previous records obtained and reviewed in epic and PCPs note After the interventions stated above, I reevaluated the patient and found patient's breathing to be much more comfortable.  She did an ambulatory pulse ox trial with no symptoms.  Will discharge having her continue her regular medications and close follow-up with PCP.   Final Clinical Impression(s) / ED Diagnoses Final diagnoses:  Acute on chronic congestive heart failure, unspecified heart failure type (HCC)  SOB (shortness of breath)  Atrial fibrillation with rapid ventricular response [facility])    Rx / DC Orders ED Discharge Orders    None       Hayden Rasmussen, MD 10/14/19 1929

## 2019-10-14 NOTE — Assessment & Plan Note (Signed)
EKG today in office demonstrated undetermined rhythm, 95 bpm, right bundle branch block, left anterior fascicular block, bifascicular block, cannot rule out inferior infarct, abnormal ECG. Has demonstrated having these in the past.  However she has not had increased shortness of breath especially with exertion or chest discomfort or pain that she describes today in the office.  Contacted cardiology they are unable to see her today and recommend her going to the emergency room therefore she was recommended to go to the emergency room from the office.  Her and her husband verbalized understanding.

## 2019-10-14 NOTE — Discharge Instructions (Addendum)
You were seen in the emergency department for increased shortness of breath. Your atrial fibrillation was going faster than usual and you had some element of fluid on your lungs. You received medications here with improvement in your symptoms. Please continue your regular medications and schedule a follow-up with your primary care doctor and cardiologist. Return to the emergency department if any worsening or concerning symptoms

## 2019-10-14 NOTE — Assessment & Plan Note (Signed)
Currently is not checking her blood sugars.  Do not know if this is a contributing factor.

## 2019-10-14 NOTE — Assessment & Plan Note (Signed)
She reports new dyspnea on exertion.  She reports having some active chest discomfort pressure and pain most recently the day before today on April 29.  EKG is somewhat consistent with previous EKGs.  Contacted cardiology they were unable to see her in the office.  Secondary to her history and presentation they recommended going to the emergency room so she is recommended to go to the emergency room after leaving the office today.  Her and her husband are here and verbalized understanding.

## 2019-10-14 NOTE — Progress Notes (Signed)
Subjective:  Patient ID: Samantha Vega, female    DOB: Sep 15, 1950  Age: 69 y.o. MRN: [id number]  CC:  Chief Complaint  Patient presents with  . Follow-up    short of breath sometimes has been going on for awhile gets short winded when she walks       HPI  Shortness of Breath This is a new problem. The current episode started more than 1 month ago. The problem occurs intermittently. The problem has been gradually worsening. Associated symptoms include chest pain and leg swelling. Pertinent negatives include no headaches, leg pain, orthopnea, PND, syncope or wheezing. The symptoms are aggravated by any activity. She has tried rest for the symptoms. The treatment provided mild relief. Her past medical history is significant for CAD. (Afib, HTN, HLD, RBBB, LAFB, DM)   Reports that she sleeps on one pillow.  She denies having any increased swelling in her hands or legs.  Reports she is taking her medications as directed and without any issue.  She reports that she is making water when she does take her Lasix.  She says that she felt bad yesterday with some active chest pain felt like something was sitting on her chest, and at times could have been feeling like a pounding sensation..  She denies having any chest pain today in the office.  However she is short of breath and feels winded and unable to talk after ambulating around the hallway.  She denies having any sweating or nausea or radiating pain with the chest discomfort that she has felt.  She reports she does not like to check her blood sugar so she is unaware of those were contributing factors.  And she does not check her blood pressure at home.  She reports that she did see the cardiologist at the beginning of the month however it was just starting around then that she was having shortness of breath and she did mention it she just reports that it is getting worse especially with activity.  Today patient denies signs and symptoms of  COVID 19 infection including fever, chills, cough, shortness of breath, and headache. Past Medical, Surgical, Social History, Allergies, and Medications have been Reviewed.   Past Medical History:  Diagnosis Date  . Arthritis   . Chest pain    Noncardiac; evaluated and treated in 2008  . Diabetes mellitus   . Enlarged heart   . Hyperlipidemia   . Hypertension   . Obesity   . PAF (paroxysmal atrial fibrillation) ([place])   . Right bundle branch block    +Palpitations.   EKG 11/2006:  NSR, RBBB, LAFB, markedly delayed R-wave progression, no change; Echocardiogram in 10/2006-suboptimal quality, normal; Event recorder-PVCs, no significant arrhythmias or symptoms ;    Current Meds  Medication Sig  . atorvastatin (LIPITOR) 80 MG tablet Take 1 tablet (80 mg total) by mouth daily.  . benazepril (LOTENSIN) 20 MG tablet Take 1 tablet (20 mg total) by mouth daily.  . Cholecalciferol (VITAMIN D3 PO) Take 1,000 Units by mouth daily.  Marland Kitchen diltiazem (CARDIZEM CD) 120 MG 24 hr capsule Take 1 capsule (120 mg total) by mouth daily.  . furosemide (LASIX) 20 MG tablet Take 1 tablet (20 mg total) by mouth daily as needed for fluid.  . metFORMIN (GLUCOPHAGE) 500 MG tablet Take 1 tablet (500 mg total) by mouth 2 (two) times daily with a meal.  . metoprolol tartrate (LOPRESSOR) 100 MG tablet Take 1 tablet (100 mg total) by mouth  2 (two) times daily.  Marland Kitchen warfarin (COUMADIN) 5 MG tablet TAKE 1 TABLET DAILY EXCEPT 1/2 TABLET ON SUNDAY AND THURSDAY OR AS DIRECTED BY COUMADIN CLINIC    ROS:  Review of Systems  Constitutional: Negative.   HENT: Negative.   Eyes: Negative.   Respiratory: Positive for shortness of breath. Negative for wheezing.   Cardiovascular: Positive for chest pain and leg swelling. Negative for orthopnea, syncope and PND.  Gastrointestinal: Negative.   Genitourinary: Negative.   Musculoskeletal: Negative.   Skin: Negative.   Neurological: Negative.  Negative for headaches.    Endo/Heme/Allergies: Negative.   Psychiatric/Behavioral: Negative.   All other systems reviewed and are negative.    Objective:   Today's Vitals: BP 132/83 (BP Location: Right Arm, Patient Position: Sitting, Cuff Size: Normal)   Pulse 71   Temp 98.2 F (36.8 C) (Temporal)   Ht 5\' 2"  (1.575 m)   Wt 201 lb (91.2 kg)   SpO2 98%   BMI 36.76 kg/m  Vitals with BMI 10/14/2019 09/19/2019 08/10/2019  Height 5\' 2"  5\' 2"  -  Weight 201 lbs 203 lbs -  BMI [id number] -  Systolic [id number]  Diastolic 83 84 88  Pulse 71 94 -     Physical Exam Vitals and nursing note reviewed.  Constitutional:      Appearance: Normal appearance. She is well-developed and well-groomed. She is obese.  HENT:     Head: Normocephalic and atraumatic.     Right Ear: External ear normal.     Left Ear: External ear normal.     Mouth/Throat:     Comments: Mask in place  Eyes:     General:        Right eye: No discharge.        Left eye: No discharge.     Conjunctiva/sclera: Conjunctivae normal.  Cardiovascular:     Rate and Rhythm: Normal rate. Rhythm irregular.     Pulses: Normal pulses.     Heart sounds: Normal heart sounds.  Pulmonary:     Effort: Pulmonary effort is normal.     Breath sounds: Normal breath sounds.  Musculoskeletal:        General: Normal range of motion.     Cervical back: Normal range of motion and neck supple.     Right lower leg: Edema present.     Left lower leg: Edema present.  Skin:    General: Skin is warm.  Neurological:     General: No focal deficit present.     Mental Status: She is alert and oriented to person, place, and time.  Psychiatric:        Attention and Perception: Attention normal.        Mood and Affect: Mood normal.        Speech: Speech normal.        Behavior: Behavior normal. Behavior is cooperative.        Thought Content: Thought content normal.        Cognition and Memory: Cognition normal.        Judgment: Judgment normal.    EKG in office  personally reviewed by me.  Undetermined rhythm, 95 bpm, right bundle branch block, left anterior fascicular block, bifascicular block.  Cannot rule out inferior infarct, age undetermined, abnormal ECG.    Assessment   1. Paroxysmal atrial fibrillation (HCC)   2. Dyspnea on exertion   3. Type 2 diabetes mellitus with vascular disease ([place])   4. RBBB (right bundle  branch block)     Tests ordered Orders Placed This Encounter  Procedures  . CBC  . COMPLETE METABOLIC PANEL WITH GFR     Plan: Please see assessment and plan per problem list above.   No orders of the defined types were placed in this encounter.   Patient to follow-up in 01/19/2020.  Perlie Mayo, NP

## 2019-10-14 NOTE — Assessment & Plan Note (Signed)
Echo did not demonstrate atrial fib however it was noted with assessment.  She is in controlled without RVR however with the dyspnea of exertion and previous history of blocks is recommended that she goes to the emergency room.

## 2019-10-14 NOTE — ED Triage Notes (Signed)
Pt reports sent from dr simpson office. Pt reports shortness of breath and intermittent chest pain. Pt tearful in triage. nad noted. Airway patent. Pt denies n/v/d.

## 2019-10-14 NOTE — ED Notes (Signed)
Ambulated patient in hallway.  o2 sat remained at 93-95%.  Patient denies any SOB

## 2019-10-27 ENCOUNTER — Ambulatory Visit: Payer: Medicare Other | Admitting: Family Medicine

## 2019-10-28 ENCOUNTER — Other Ambulatory Visit: Payer: Self-pay

## 2019-10-28 ENCOUNTER — Encounter: Payer: Self-pay | Admitting: Family Medicine

## 2019-10-28 ENCOUNTER — Ambulatory Visit (INDEPENDENT_AMBULATORY_CARE_PROVIDER_SITE_OTHER): Payer: Medicare Other | Admitting: Family Medicine

## 2019-10-28 DIAGNOSIS — Z7901 Long term (current) use of anticoagulants: Secondary | ICD-10-CM

## 2019-10-28 DIAGNOSIS — I10 Essential (primary) hypertension: Secondary | ICD-10-CM | POA: Diagnosis not present

## 2019-10-28 DIAGNOSIS — E785 Hyperlipidemia, unspecified: Secondary | ICD-10-CM

## 2019-10-28 DIAGNOSIS — Z09 Encounter for follow-up examination after completed treatment for conditions other than malignant neoplasm: Secondary | ICD-10-CM | POA: Insufficient documentation

## 2019-10-28 NOTE — Progress Notes (Signed)
Subjective:  Patient ID: Samantha Vega, female    DOB: 06-07-1951  Age: 69 y.o. MRN: [id number]  CC:  Chief Complaint  Patient presents with  . ER follow up    seen at ER on 4/30 woth CHF with RVR      HPI  HPI   Samantha Vega is a 69 year old female patient of Dr. Griffin Dakin.  Who presents today for follow-up after treatment at the emergency room back on 4/30.  She presented to the office for evaluation that morning and was having increased shortness of breath.  She does have a history of having atrial fibrillation and usually cannot tell when she is in A. Fib. We sent her to the emergency room for better work-up.  In the emergency room her EKG had a ventricular rate of 118, atrial fibrillation with RVR response with premature ventricular lesion complexes.  BNP was only tad bit elevated at 227.  All of her lab results were within normal ranges.  She was provided with IV Lasix and diuresed well and started feeling a lot better with her breathing.  Pulse ox demonstrated good oxygenation while off oxygen. She was discharged home and advised to follow-up at PCP office.  She reports that she is done much better since going to the hospital and having the diuresis.  Reports she is taking all her medications as directed.  And without any issue.  Continues to work on her diet control to avoid salt and heavy fried fatty foods.  Denies having any headaches, chest pain, fevers, chills, shortness of breath, dizziness, syncope, leg swelling or any other signs or symptoms of uncontrolled blood pressure, heart failure, atrial fibrillation.  Today patient denies signs and symptoms of COVID 19 infection including fever, chills, cough, shortness of breath, and headache. Past Medical, Surgical, Social History, Allergies, and Medications have been Reviewed.   Past Medical History:  Diagnosis Date  . Annual physical exam 06/07/2015  . Arthritis   . Baker's cyst of knee 10/04/2013  . Chest pain    Noncardiac; evaluated and treated in 2008  . Diabetes mellitus   . Dyspnea on exertion 10/14/2019  . Enlarged heart   . Hyperlipidemia   . Hypertension   . Metabolic syndrome X [id number]  . OA (osteoarthritis) of knee 05/05/2012   Left, takes tramadol on avg twice per week  . Obesity   . PAF (paroxysmal atrial fibrillation) ([place])   . Right bundle branch block    +Palpitations.   EKG 11/2006:  NSR, RBBB, LAFB, markedly delayed R-wave progression, no change; Echocardiogram in 10/2006-suboptimal quality, normal; Event recorder-PVCs, no significant arrhythmias or symptoms ;  . TIA (transient ischemic attack) 06/02/2019  . Unspecified visual loss 12/08/2008    Current Meds  Medication Sig  . atorvastatin (LIPITOR) 80 MG tablet Take 1 tablet (80 mg total) by mouth daily.  . benazepril (LOTENSIN) 20 MG tablet Take 1 tablet (20 mg total) by mouth daily.  . Cholecalciferol (VITAMIN D3 PO) Take 1,000 Units by mouth daily.  Marland Kitchen diltiazem (CARDIZEM CD) 120 MG 24 hr capsule Take 1 capsule (120 mg total) by mouth daily.  . furosemide (LASIX) 20 MG tablet Take 1 tablet (20 mg total) by mouth daily as needed for fluid.  . metFORMIN (GLUCOPHAGE) 500 MG tablet Take 1 tablet (500 mg total) by mouth 2 (two) times daily with a meal.  . metoprolol tartrate (LOPRESSOR) 100 MG tablet Take 1 tablet (100 mg total) by mouth 2 (two) times  daily.  . warfarin (COUMADIN) 5 MG tablet TAKE 1 TABLET DAILY EXCEPT 1/2 TABLET ON SUNDAY AND THURSDAY OR AS DIRECTED BY COUMADIN CLINIC    ROS:  Review of Systems  Constitutional: Negative.   HENT: Negative.   Eyes: Negative.   Respiratory: Negative.   Cardiovascular: Negative.   Gastrointestinal: Negative.   Genitourinary: Negative.   Musculoskeletal: Negative.   Skin: Negative.   Neurological: Negative.   Endo/Heme/Allergies: Negative.   Psychiatric/Behavioral: Negative.   All other systems reviewed and are negative.    Objective:   Today's Vitals: BP 100/70    Pulse 73   Temp (!) 96.9 F (36.1 C) (Temporal)   Resp 16   Ht 5\' 2"  (1.575 m)   Wt 200 lb 12.8 oz (91.1 kg)   SpO2 97%   BMI 36.73 kg/m  Vitals with BMI 10/28/2019 10/14/2019 10/14/2019  Height 5\' 2"  - -  Weight 200 lbs 13 oz - -  BMI 99991111 - -  Systolic [id number]  Diastolic 70 78 82  Pulse 73 89 86     Physical Exam Vitals and nursing note reviewed.  Constitutional:      Appearance: Normal appearance. She is well-developed and well-groomed. She is obese.  HENT:     Head: Normocephalic and atraumatic.     Right Ear: External ear normal.     Left Ear: External ear normal.     Mouth/Throat:     Comments: Mask in place Eyes:     General:        Right eye: No discharge.        Left eye: No discharge.     Conjunctiva/sclera: Conjunctivae normal.  Cardiovascular:     Rate and Rhythm: Normal rate. Rhythm irregular.     Pulses: Normal pulses.     Heart sounds: Normal heart sounds.  Pulmonary:     Effort: Pulmonary effort is normal.     Breath sounds: Normal breath sounds.  Musculoskeletal:        General: Normal range of motion.     Cervical back: Normal range of motion and neck supple.     Right lower leg: No edema.     Left lower leg: No edema.  Skin:    General: Skin is warm.  Neurological:     General: No focal deficit present.     Mental Status: She is alert and oriented to person, place, and time.  Psychiatric:        Attention and Perception: Attention normal.        Mood and Affect: Mood normal.        Speech: Speech normal.        Behavior: Behavior normal. Behavior is cooperative.        Thought Content: Thought content normal.        Cognition and Memory: Cognition normal.        Judgment: Judgment normal.          Assessment   1. Morbid obesity ([place])   2. Encounter for examination following treatment at hospital   3. Essential hypertension   4. Chronic anticoagulation   5. Hyperlipidemia LDL goal <70     Tests ordered No orders of the  defined types were placed in this encounter.    Plan: Please see assessment and plan per problem list above.   No orders of the defined types were placed in this encounter.   Patient to follow-up in 01/19/2020  .  Barbee Cough  Jerelene Redden, NP

## 2019-10-28 NOTE — Assessment & Plan Note (Signed)
Samantha Vega is encouraged to maintain a well balanced diet that is low in salt. Controlled, continue current medication regimen.  Additionally, she is also reminded that exercise is beneficial for heart health and control of  Blood pressure. 30-60 minutes daily is recommended-walking was suggested.

## 2019-10-28 NOTE — Assessment & Plan Note (Signed)
Patient is on Coumadin for paroxysmal atrial fibrillation.  She denies having any signs or symptoms of abnormal bleeding.  Continue at this time.

## 2019-10-28 NOTE — Assessment & Plan Note (Signed)
Encouraged a heart healthy diet.  Continue taking Lipitor at this time.

## 2019-10-28 NOTE — Assessment & Plan Note (Signed)
Review of hospital note, testing, treatments.  Overall doing well since having been sent to the emergency room is taking all her medications without any issues or concerns.  We will continue all current medications and follow-up in 3 months.

## 2019-10-28 NOTE — Patient Instructions (Signed)
I appreciate the opportunity to provide you with care for your health and wellness. Today we discussed: recent hospital visit   Follow up: Sept   No labs or referrals today  Y'ALL ENJOY THE SUMMER :) GLAD YOU BOTH ARE FEELING WELL!!  Please continue to practice social distancing to keep you, your family, and our community safe.  If you must go out, please wear a mask and practice good handwashing.  It was a pleasure to see you and I look forward to continuing to work together on your health and well-being. Please do not hesitate to call the office if you need care or have questions about your care.  Have a wonderful day and week. With Gratitude, Cherly Beach, DNP, AGNP-BC

## 2019-10-28 NOTE — Assessment & Plan Note (Signed)
Obesity is linked to hypertension, hyperlipidemia Samantha Vega is re-educated about the importance of exercise daily to help with weight management. A minumum of 30 minutes daily is recommended. Additionally, importance of healthy food choices  with portion control discussed.   Wt Readings from Last 3 Encounters:  10/28/19 200 lb 12.8 oz (91.1 kg)  10/14/19 200 lb 9.9 oz (91 kg)  10/14/19 201 lb (91.2 kg)

## 2019-11-14 ENCOUNTER — Other Ambulatory Visit: Payer: Self-pay | Admitting: Family Medicine

## 2019-11-21 ENCOUNTER — Ambulatory Visit (INDEPENDENT_AMBULATORY_CARE_PROVIDER_SITE_OTHER): Payer: Medicare Other | Admitting: *Deleted

## 2019-11-21 ENCOUNTER — Other Ambulatory Visit: Payer: Self-pay

## 2019-11-21 DIAGNOSIS — Z5181 Encounter for therapeutic drug level monitoring: Secondary | ICD-10-CM

## 2019-11-21 DIAGNOSIS — I480 Paroxysmal atrial fibrillation: Secondary | ICD-10-CM | POA: Diagnosis not present

## 2019-11-21 LAB — POCT INR: INR: 2 (ref 2.0–3.0)

## 2019-11-21 NOTE — Patient Instructions (Signed)
Continue warfarin 1/2 tablet daily except 1 tablet on Mondays, Wednesdays and Fridays Recheck in 6 weeks 

## 2019-11-28 ENCOUNTER — Other Ambulatory Visit: Payer: Self-pay

## 2019-11-28 ENCOUNTER — Ambulatory Visit (HOSPITAL_COMMUNITY)
Admission: RE | Admit: 2019-11-28 | Discharge: 2019-11-28 | Disposition: A | Discharge status: Home / Self Care | Payer: Medicare Other | Source: Ambulatory Visit | Attending: Family Medicine | Admitting: Family Medicine

## 2019-11-28 DIAGNOSIS — Z1231 Encounter for screening mammogram for malignant neoplasm of breast: Secondary | ICD-10-CM | POA: Insufficient documentation

## 2019-12-05 ENCOUNTER — Other Ambulatory Visit: Payer: Self-pay

## 2019-12-05 ENCOUNTER — Telehealth: Payer: Self-pay

## 2019-12-05 MED ORDER — METOPROLOL TARTRATE 100 MG PO TABS: 100.0000 mg | ORAL_TABLET | Freq: Two times a day (BID) | ORAL | 0 refills | Status: DC

## 2019-12-05 NOTE — Telephone Encounter (Signed)
Please call in Metoprolol in to [facility]

## 2019-12-05 NOTE — Telephone Encounter (Signed)
Medication refilled. Sent to requested pharmacy.

## 2019-12-06 ENCOUNTER — Other Ambulatory Visit: Payer: Self-pay | Admitting: Cardiology

## 2019-12-06 ENCOUNTER — Other Ambulatory Visit: Payer: Self-pay | Admitting: Family Medicine

## 2020-01-02 ENCOUNTER — Other Ambulatory Visit: Payer: Self-pay

## 2020-01-02 ENCOUNTER — Ambulatory Visit: Payer: Medicare Other

## 2020-01-02 ENCOUNTER — Ambulatory Visit (INDEPENDENT_AMBULATORY_CARE_PROVIDER_SITE_OTHER): Payer: Medicare Other | Admitting: *Deleted

## 2020-01-02 DIAGNOSIS — Z5181 Encounter for therapeutic drug level monitoring: Secondary | ICD-10-CM | POA: Diagnosis not present

## 2020-01-02 DIAGNOSIS — I480 Paroxysmal atrial fibrillation: Secondary | ICD-10-CM

## 2020-01-02 LAB — POCT INR: INR: 2.1 (ref 2.0–3.0)

## 2020-01-02 LAB — HM DIABETES EYE EXAM

## 2020-01-02 NOTE — Patient Instructions (Signed)
Continue warfarin 1/2 tablet daily except 1 tablet on Mondays, Wednesdays and Fridays Recheck in 6 weeks 

## 2020-01-19 ENCOUNTER — Other Ambulatory Visit: Payer: Self-pay

## 2020-01-19 ENCOUNTER — Encounter: Payer: Self-pay | Admitting: Family Medicine

## 2020-01-19 ENCOUNTER — Telehealth (INDEPENDENT_AMBULATORY_CARE_PROVIDER_SITE_OTHER): Payer: Medicare Other | Admitting: Family Medicine

## 2020-01-19 ENCOUNTER — Other Ambulatory Visit: Payer: Self-pay | Admitting: Cardiology

## 2020-01-19 VITALS — BP 100/70 | Ht 62.0 in | Wt 200.0 lb

## 2020-01-19 DIAGNOSIS — Z0000 Encounter for general adult medical examination without abnormal findings: Secondary | ICD-10-CM | POA: Diagnosis not present

## 2020-01-19 NOTE — Progress Notes (Signed)
Subjective:   Samantha Vega is a 69 y.o. female who presents for Medicare Annual (Subsequent) preventive examination.  Location of Patient: Home Location of Provider: Telephone Consent was obtain for visit to be over via telehealth.  I verified that I am speaking with the correct person using two identifiers.   Review of Systems    yes       Objective:    Today's Vitals   01/19/20 0822  BP: 100/70  Weight: 200 lb (90.7 kg)  Height: 5\' 2"  (1.575 m)  PainSc: 0-No pain   Body mass index is 36.58 kg/m.  Advanced Directives 10/14/2019 06/02/2019 02/14/2018 02/19/2017 10/09/2016 02/05/2016 01/03/2016  Does Patient Have a Medical Advance Directive? No No No No No No No  Would patient like information on creating a medical advance directive? - No - Patient declined - - Yes (MAU/Ambulatory/Procedural Areas - Information given) No - patient declined information No - patient declined information  Pre-existing out of facility DNR order (yellow form or pink MOST form) - - - - - - -    Current Medications (verified) Outpatient Encounter Medications as of 01/19/2020  Medication Sig  . atorvastatin (LIPITOR) 80 MG tablet Take 1 tablet (80 mg total) by mouth daily.  . benazepril (LOTENSIN) 20 MG tablet TAKE 1 TABLET BY MOUTH EVERY DAY  . Cholecalciferol (VITAMIN D3 PO) Take 1,000 Units by mouth daily.  Marland Kitchen diltiazem (CARDIZEM CD) 120 MG 24 hr capsule Take 1 capsule (120 mg total) by mouth daily.  . furosemide (LASIX) 20 MG tablet Take 1 tablet (20 mg total) by mouth daily as needed for fluid.  . metFORMIN (GLUCOPHAGE) 500 MG tablet Take 1 tablet (500 mg total) by mouth 2 (two) times daily with a meal.  . metoprolol tartrate (LOPRESSOR) 100 MG tablet TAKE 1 TABLET BY MOUTH TWICE A DAY  . warfarin (COUMADIN) 5 MG tablet TAKE 1 TABLET DAILY EXCEPT 1/2 TABLET ON SUNDAY AND THURSDAY OR AS DIRECTED BY COUMADIN CLINIC   No facility-administered encounter medications on file as of 01/19/2020.     Allergies (verified) Patient has no known allergies.   History: Past Medical History:  Diagnosis Date  . Annual physical exam 06/07/2015  . Arthritis   . Baker's cyst of knee 10/04/2013  . Chest pain    Noncardiac; evaluated and treated in 2008  . Diabetes mellitus   . Dyspnea on exertion 10/14/2019  . Enlarged heart   . Hyperlipidemia   . Hypertension   . Metabolic syndrome X 1/49/7026  . OA (osteoarthritis) of knee 05/05/2012   Left, takes tramadol on avg twice per week  . Obesity   . PAF (paroxysmal atrial fibrillation) ([place])   . Right bundle branch block    +Palpitations.   EKG 11/2006:  NSR, RBBB, LAFB, markedly delayed R-wave progression, no change; Echocardiogram in 10/2006-suboptimal quality, normal; Event recorder-PVCs, no significant arrhythmias or symptoms ;  . TIA (transient ischemic attack) 06/02/2019  . Unspecified visual loss 12/08/2008   Past Surgical History:  Procedure Laterality Date  . ABDOMINAL HYSTERECTOMY    . BREAST BIOPSY Left    benign  . CATARACT EXTRACTION W/PHACO Right 01/08/2016   Procedure: CATARACT EXTRACTION PHACO AND INTRAOCULAR LENS PLACEMENT (IOC);  Surgeon: Rutherford Guys, MD;  Location: [facility];  Service: Ophthalmology;  Laterality: Right;  CDE: 5.78  . CATARACT EXTRACTION W/PHACO Left 02/05/2016   Procedure: CATARACT EXTRACTION PHACO AND INTRAOCULAR LENS PLACEMENT LEFT EYE CDE=6.50;  Surgeon: Rutherford Guys, MD;  Location:  [facility];  Service: Ophthalmology;  Laterality: Left;  . COLONOSCOPY  2006   Dr. Tamala Julian: Normal, repeat in 2009.  Marland Kitchen COLONOSCOPY N/A 10/08/2015   Procedure: COLONOSCOPY;  Surgeon: Danie Binder, MD;  Location: [facility];  Service: Endoscopy;  Laterality: N/A;  230 - moved to 1:30, spoke with pt   . POLYPECTOMY  10/08/2015   Procedure: POLYPECTOMY;  Surgeon: Danie Binder, MD;  Location: [facility];  Service: Endoscopy;;  cecal polyp removed via cold forceps with one clip placed/ rectal polyp removed via cold forcep  .  SHOULDER SURGERY  2006   Left-post-trauma  . TOTAL ABDOMINAL HYSTERECTOMY W/ BILATERAL SALPINGOOPHORECTOMY     Family History  Problem Relation Age of Onset  . Hypertension Mother   . Arthritis Mother   . Stroke Father   . Hypertension Father   . Coronary artery disease Father   . Diabetes Sister   . Sarcoidosis Sister   . Diabetes Sister   . Heart failure Brother   . Congestive Heart Failure Brother   . Colon cancer Neg Hx    Social History   Socioeconomic History  . Marital status: Married    Spouse name: Not on file  . Number of children: 0  . Years of education: Not on file  . Highest education level: Not on file  Occupational History  . Occupation: Museum/gallery exhibitions officer: UNEMPLOYED  Tobacco Use  . Smoking status: Never Smoker  . Smokeless tobacco: Never Used  Vaping Use  . Vaping Use: Never used  Substance and Sexual Activity  . Alcohol use: No  . Drug use: No  . Sexual activity: Yes    Birth control/protection: Surgical  Other Topics Concern  . Not on file  Social History Narrative  . Not on file   Social Determinants of Health   Financial Resource Strain: Low Risk   . Difficulty of Paying Living Expenses: Not hard at all  Food Insecurity: No Food Insecurity  . Worried About Charity fundraiser in the Last Year: Never true  . Ran Out of Food in the Last Year: Never true  Transportation Needs: No Transportation Needs  . Lack of Transportation (Medical): No  . Lack of Transportation (Non-Medical): No  Physical Activity: Sufficiently Active  . Days of Exercise per Week: 5 days  . Minutes of Exercise per Session: 30 min  Stress: No Stress Concern Present  . Feeling of Stress : Not at all  Social Connections: Moderately Integrated  . Frequency of Communication with Friends and Family: More than three times a week  . Frequency of Social Gatherings with Friends and Family: More than three times a week  . Attends Religious Services: More than 4  times per year  . Active Member of Clubs or Organizations: No  . Attends Archivist Meetings: Never  . Marital Status: Married    Tobacco Counseling Counseling given: Not Answered   Clinical Intake:     Pain Score: 0-No pain           Diabetic?yes         Activities of Daily Living In your present state of health, do you have any difficulty performing the following activities: 01/19/2020 06/02/2019  Hearing? N N  Vision? N N  Difficulty concentrating or making decisions? N N  Walking or climbing stairs? N N  Dressing or bathing? N N  Doing errands, shopping? N N  Some recent data might be  hidden    Patient Care Team: Fayrene Helper, MD as PCP - General Branch, Alphonse Guild, MD as PCP - Cardiology (Cardiology) Harl Bowie Alphonse Guild, MD as Consulting Physician (Cardiology) Danie Binder, MD (Inactive) as Consulting Physician (Gastroenterology)  Indicate any recent Medical Services you may have received from other than [facility] providers in the past year (date may be approximate).     Assessment:   This is a routine wellness examination for Samantha Vega.  Hearing/Vision screen No exam data present  Dietary issues and exercise activities discussed: Current Exercise Habits: Home exercise routine, Time (Minutes): 30, Frequency (Times/Week): 5, Weekly Exercise (Minutes/Week): 150, Intensity: Mild, Exercise limited by: None identified  Goals    . Exercise 3x per week (30 min per time)     Recommend starting a routine exercise program at least 3 days a week for 30-45 minutes at a time as tolerated.      . Increase physical activity     Start going to the [facility]       Depression Screen PHQ 2/9 Scores 01/19/2020 10/14/2019 08/10/2019 06/16/2019 01/18/2019 10/26/2018 06/28/2018  PHQ - 2 Score 0 0 0 0 0 0 0  PHQ- 9 Score 0 1 - - - - -    Fall Risk Fall Risk  01/19/2020 10/28/2019 10/14/2019 08/10/2019 06/16/2019  Falls in the past year? 0 0 0 0 0  Number falls in past  yr: 0 0 0 0 0  Injury with Fall? 0 0 0 0 0  Risk for fall due to : No Fall Risks - No Fall Risks - -  Follow up Falls evaluation completed - Falls evaluation completed - -    Any stairs in or around the home? No  If so, are there any without handrails? Yes  Home free of loose throw rugs in walkways, pet beds, electrical cords, etc? Yes  Adequate lighting in your home to reduce risk of falls? Yes   ASSISTIVE DEVICES UTILIZED TO PREVENT FALLS:  Life alert? No  Use of a cane, walker or w/c? No  Grab bars in the bathroom? No  Shower chair or bench in shower? No  Elevated toilet seat or a handicapped toilet? No   TIMED UP AND GO:  Was the test performed? No .  Length of time to ambulate 10 feet: NA sec.     Cognitive Function:     6CIT Screen 01/19/2020 01/18/2019 01/12/2018 10/09/2016  What Year? 0 points 0 points 0 points 0 points  What month? 0 points 0 points 0 points 0 points  What time? 0 points 0 points 0 points 0 points  Count back from 20 4 points 4 points 2 points 0 points  Months in reverse 4 points 4 points 4 points 0 points  Repeat phrase 10 points 0 points 0 points 0 points  Total Score 18 8 6  0    Immunizations Immunization History  Administered Date(s) Administered  . Fluad Quad(high Dose 65+) 04/04/2019  . Influenza Split 04/22/2011, 04/16/2012  . Influenza Whole 03/23/2007, 05/18/2009  . Influenza,inj,Quad PF,6+ Mos 04/25/2013, 05/30/2014, 06/07/2015, 02/12/2016, 04/13/2017, 02/25/2018  . Moderna SARS-COVID-2 Vaccination 09/02/2019, 10/05/2019  . Pneumococcal Conjugate-13 09/06/2014  . Pneumococcal Polysaccharide-23 10/31/2009, 02/12/2016  . Td 06/17/2003  . Tdap 04/22/2011  . Zoster 05/18/2009    TDAP status: Up to date Flu Vaccine status: Up to date Pneumococcal vaccine status: Up to date Covid-19 vaccine status: Completed vaccines  Qualifies for Shingles Vaccine? Yes   Zostavax completed Yes  Shingrix Completed?: Yes  Screening Tests Health  Maintenance  Topic Date Due  . INFLUENZA VACCINE  01/15/2020  . HEMOGLOBIN A1C  03/21/2020  . FOOT EXAM  04/03/2020  . COLONOSCOPY  10/08/2020  . OPHTHALMOLOGY EXAM  01/01/2021  . TETANUS/TDAP  04/21/2021  . MAMMOGRAM  11/27/2021  . DEXA SCAN  Completed  . COVID-19 Vaccine  Completed  . Hepatitis C Screening  Completed  . PNA vac Low Risk Adult  Completed    Health Maintenance  Health Maintenance Due  Topic Date Due  . INFLUENZA VACCINE  01/15/2020    Colorectal cancer screening: Completed 10-09-15. Repeat every 10 years Mammogram status: Completed 11-28-19. Repeat every year Bone Density status: Completed 01-20-18. Results reflect: Bone density results: NORMAL. Repeat every 5 years.  Lung Cancer Screening: (Low Dose CT Chest recommended if Age 72-80 years, 30 pack-year currently smoking OR have quit w/in 15years.) does not qualify.   Lung Cancer Screening Referral: no  Additional Screening:  Hepatitis C Screening: does not qualify;  Vision Screening: Recommended annual ophthalmology exams for early detection of glaucoma and other disorders of the eye. Is the patient up to date with their annual eye exam?  Yes  Who is the provider or what is the name of the office in which the patient attends annual eye exams? [facility] in [place] If pt is not established with a provider, would they like to be referred to a provider to establish care? No .   Dental Screening: Recommended annual dental exams for proper oral hygiene  Community Resource Referral / Chronic Care Management: CRR required this visit?  No   CCM required this visit?  No      Plan:     1. Encounter for Medicare annual wellness exam   I have personally reviewed and noted the following in the patient's chart:   . Medical and social history . Use of alcohol, tobacco or illicit drugs  . Current medications and supplements . Functional ability and status . Nutritional status . Physical activity . Advanced  directives . List of other physicians . Hospitalizations, surgeries, and ER visits in previous 12 months . Vitals . Screenings to include cognitive, depression, and falls . Referrals and appointments  In addition, I have reviewed and discussed with patient certain preventive protocols, quality metrics, and best practice recommendations. A written personalized care plan for preventive services as well as general preventive health recommendations were provided to patient.     Perlie Mayo, NP   01/19/2020    I provided 20 minutes of non-face-to-face time during this encounter.

## 2020-01-19 NOTE — Patient Instructions (Signed)
Samantha Vega , Thank you for taking time to come for your Medicare Wellness Visit. I appreciate your ongoing commitment to your health goals. Please review the following plan we discussed and let me know if I can assist you in the future.   Please continue to practice social distancing to keep you, your family, and our community safe.  If you must go out, please wear a Mask and practice good handwashing.  Screening recommendations/referrals: Colonoscopy: up to date Mammogram: up to date Bone Density: up to date Recommended yearly ophthalmology/optometry visit for glaucoma screening and checkup Recommended yearly dental visit for hygiene and checkup  Vaccinations: up to date  Advanced directives: Declined  Conditions/risks identified: Falls  Next appointment: 02/21/2020   Preventive Care 7 Years and Older, Female Preventive care refers to lifestyle choices and visits with your health care provider that can promote health and wellness. What does preventive care include?  A yearly physical exam. This is also called an annual well check.  Dental exams once or twice a year.  Routine eye exams. Ask your health care provider how often you should have your eyes checked.  Personal lifestyle choices, including:  Daily care of your teeth and gums.  Regular physical activity.  Eating a healthy diet.  Avoiding tobacco and drug use.  Limiting alcohol use.  Practicing safe sex.  Taking low-dose aspirin every day.  Taking vitamin and mineral supplements as recommended by your health care provider. What happens during an annual well check? The services and screenings done by your health care provider during your annual well check will depend on your age, overall health, lifestyle risk factors, and family history of disease. Counseling  Your health care provider may ask you questions about your:  Alcohol use.  Tobacco use.  Drug use.  Emotional well-being.  Home and  relationship well-being.  Sexual activity.  Eating habits.  History of falls.  Memory and ability to understand (cognition).  Work and work Statistician.  Reproductive health. Screening  You may have the following tests or measurements:  Height, weight, and BMI.  Blood pressure.  Lipid and cholesterol levels. These may be checked every 5 years, or more frequently if you are over 76 years old.  Skin check.  Lung cancer screening. You may have this screening every year starting at age 66 if you have a 30-pack-year history of smoking and currently smoke or have quit within the past 15 years.  Fecal occult blood test (FOBT) of the stool. You may have this test every year starting at age 43.  Flexible sigmoidoscopy or colonoscopy. You may have a sigmoidoscopy every 5 years or a colonoscopy every 10 years starting at age 72.  Hepatitis C blood test.  Hepatitis B blood test.  Sexually transmitted disease (STD) testing.  Diabetes screening. This is done by checking your blood sugar (glucose) after you have not eaten for a while (fasting). You may have this done every 1-3 years.  Bone density scan. This is done to screen for osteoporosis. You may have this done starting at age 3.  Mammogram. This may be done every 1-2 years. Talk to your health care provider about how often you should have regular mammograms. Talk with your health care provider about your test results, treatment options, and if necessary, the need for more tests. Vaccines  Your health care provider may recommend certain vaccines, such as:  Influenza vaccine. This is recommended every year.  Tetanus, diphtheria, and acellular pertussis (Tdap, Td) vaccine. You  may need a Td booster every 10 years.  Zoster vaccine. You may need this after age 50.  Pneumococcal 13-valent conjugate (PCV13) vaccine. One dose is recommended after age 56.  Pneumococcal polysaccharide (PPSV23) vaccine. One dose is recommended after  age 33. Talk to your health care provider about which screenings and vaccines you need and how often you need them. This information is not intended to replace advice given to you by your health care provider. Make sure you discuss any questions you have with your health care provider. Document Released: 06/29/2015 Document Revised: 02/20/2016 Document Reviewed: 04/03/2015 Elsevier Interactive Patient Education  2017 [place] Prevention in the Home Falls can cause injuries. They can happen to people of all ages. There are many things you can do to make your home safe and to help prevent falls. What can I do on the outside of my home?  Regularly fix the edges of walkways and driveways and fix any cracks.  Remove anything that might make you trip as you walk through a door, such as a raised step or threshold.  Trim any bushes or trees on the path to your home.  Use bright outdoor lighting.  Clear any walking paths of anything that might make someone trip, such as rocks or tools.  Regularly check to see if handrails are loose or broken. Make sure that both sides of any steps have handrails.  Any raised decks and porches should have guardrails on the edges.  Have any leaves, snow, or ice cleared regularly.  Use sand or salt on walking paths during winter.  Clean up any spills in your garage right away. This includes oil or grease spills. What can I do in the bathroom?  Use night lights.  Install grab bars by the toilet and in the tub and shower. Do not use towel bars as grab bars.  Use non-skid mats or decals in the tub or shower.  If you need to sit down in the shower, use a plastic, non-slip stool.  Keep the floor dry. Clean up any water that spills on the floor as soon as it happens.  Remove soap buildup in the tub or shower regularly.  Attach bath mats securely with double-sided non-slip rug tape.  Do not have throw rugs and other things on the floor that can  make you trip. What can I do in the bedroom?  Use night lights.  Make sure that you have a light by your bed that is easy to reach.  Do not use any sheets or blankets that are too big for your bed. They should not hang down onto the floor.  Have a firm chair that has side arms. You can use this for support while you get dressed.  Do not have throw rugs and other things on the floor that can make you trip. What can I do in the kitchen?  Clean up any spills right away.  Avoid walking on wet floors.  Keep items that you use a lot in easy-to-reach places.  If you need to reach something above you, use a strong step stool that has a grab bar.  Keep electrical cords out of the way.  Do not use floor polish or wax that makes floors slippery. If you must use wax, use non-skid floor wax.  Do not have throw rugs and other things on the floor that can make you trip. What can I do with my stairs?  Do not leave any items  on the stairs.  Make sure that there are handrails on both sides of the stairs and use them. Fix handrails that are broken or loose. Make sure that handrails are as long as the stairways.  Check any carpeting to make sure that it is firmly attached to the stairs. Fix any carpet that is loose or worn.  Avoid having throw rugs at the top or bottom of the stairs. If you do have throw rugs, attach them to the floor with carpet tape.  Make sure that you have a light switch at the top of the stairs and the bottom of the stairs. If you do not have them, ask someone to add them for you. What else can I do to help prevent falls?  Wear shoes that:  Do not have high heels.  Have rubber bottoms.  Are comfortable and fit you well.  Are closed at the toe. Do not wear sandals.  If you use a stepladder:  Make sure that it is fully opened. Do not climb a closed stepladder.  Make sure that both sides of the stepladder are locked into place.  Ask someone to hold it for you,  if possible.  Clearly mark and make sure that you can see:  Any grab bars or handrails.  First and last steps.  Where the edge of each step is.  Use tools that help you move around (mobility aids) if they are needed. These include:  Canes.  Walkers.  Scooters.  Crutches.  Turn on the lights when you go into a dark area. Replace any light bulbs as soon as they burn out.  Set up your furniture so you have a clear path. Avoid moving your furniture around.  If any of your floors are uneven, fix them.  If there are any pets around you, be aware of where they are.  Review your medicines with your doctor. Some medicines can make you feel dizzy. This can increase your chance of falling. Ask your doctor what other things that you can do to help prevent falls. This information is not intended to replace advice given to you by your health care provider. Make sure you discuss any questions you have with your health care provider. Document Released: 03/29/2009 Document Revised: 11/08/2015 Document Reviewed: 07/07/2014 Elsevier Interactive Patient Education  2017 [employer].

## 2020-02-13 ENCOUNTER — Ambulatory Visit (INDEPENDENT_AMBULATORY_CARE_PROVIDER_SITE_OTHER): Payer: Medicare Other | Admitting: *Deleted

## 2020-02-13 DIAGNOSIS — I480 Paroxysmal atrial fibrillation: Secondary | ICD-10-CM | POA: Diagnosis not present

## 2020-02-13 DIAGNOSIS — Z5181 Encounter for therapeutic drug level monitoring: Secondary | ICD-10-CM | POA: Diagnosis not present

## 2020-02-13 LAB — POCT INR: INR: 2.4 (ref 2.0–3.0)

## 2020-02-13 NOTE — Patient Instructions (Signed)
Continue warfarin 1/2 tablet daily except 1 tablet on Mondays, Wednesdays and Fridays Recheck in 6 weeks 

## 2020-02-17 ENCOUNTER — Ambulatory Visit: Payer: Medicare Other | Admitting: Family Medicine

## 2020-02-21 ENCOUNTER — Ambulatory Visit (INDEPENDENT_AMBULATORY_CARE_PROVIDER_SITE_OTHER): Payer: Medicare Other | Admitting: Family Medicine

## 2020-02-21 ENCOUNTER — Encounter: Payer: Self-pay | Admitting: Family Medicine

## 2020-02-21 ENCOUNTER — Other Ambulatory Visit: Payer: Self-pay

## 2020-02-21 VITALS — BP 111/75 | HR 98 | Resp 16 | Ht 61.0 in | Wt 200.1 lb

## 2020-02-21 DIAGNOSIS — D126 Benign neoplasm of colon, unspecified: Secondary | ICD-10-CM | POA: Diagnosis not present

## 2020-02-21 DIAGNOSIS — E1159 Type 2 diabetes mellitus with other circulatory complications: Secondary | ICD-10-CM | POA: Diagnosis not present

## 2020-02-21 DIAGNOSIS — I10 Essential (primary) hypertension: Secondary | ICD-10-CM | POA: Diagnosis not present

## 2020-02-21 DIAGNOSIS — Z23 Encounter for immunization: Secondary | ICD-10-CM

## 2020-02-21 DIAGNOSIS — E785 Hyperlipidemia, unspecified: Secondary | ICD-10-CM

## 2020-02-21 DIAGNOSIS — E559 Vitamin D deficiency, unspecified: Secondary | ICD-10-CM

## 2020-02-21 DIAGNOSIS — E049 Nontoxic goiter, unspecified: Secondary | ICD-10-CM

## 2020-02-21 LAB — POCT GLYCOSYLATED HEMOGLOBIN (HGB A1C): Hemoglobin A1C: 5.9 % — AB (ref 4.0–5.6)

## 2020-02-21 NOTE — Patient Instructions (Signed)
Annual physical exam with MD in Feb, HBA1C at visit , call if you need me sooner  CONGRATS on weight l.oss and excellent blood sugar, keep it up  STOP Metformin, you do not need this!  Flu vaccine today   lipid, cmp and eGFr and TSH and vit D today  Think about what you will eat, plan ahead. Choose " clean, green, fresh or frozen" over canned, processed or packaged foods which are more sugary, salty and fatty. 70 to 75% of food eaten should be vegetables and fruit. Three meals at set times with snacks allowed between meals, but they must be fruit or vegetables. Aim to eat over a 12 hour period , example 7 am to 7 pm, and STOP after  your last meal of the day. Drink water,generally about 64 ounces per day, no other drink is as healthy. Fruit juice is best enjoyed in a healthy way, by EATING the fruit. It is important that you exercise regularly at least 30 minutes 5 times a week. If you develop chest pain, have severe difficulty breathing, or feel very tired, stop exercising immediately and seek medical attention  Thanks for choosing [facility], we consider it a privelige to serve you.

## 2020-02-21 NOTE — Assessment & Plan Note (Signed)
Samantha Vega is reminded of the importance of commitment to daily physical activity for 30 minutes or more, as able and the need to limit carbohydrate intake to 30 to 60 grams per meal to help with blood sugar control.   The need to take medication as prescribed, test blood sugar as directed, and to call between visits if there is a concern that blood sugar is uncontrolled is also discussed.   Samantha Vega is reminded of the importance of daily foot exam, annual eye examination, and good blood sugar, blood pressure and cholesterol control.  Diabetic Labs Latest Ref Rng & Units 02/21/2020 10/14/2019 09/20/2019 06/03/2019 06/02/2019  HbA1c 4.0 - 5.6 % 5.9(A) - 6.3(H) - -  Microalbumin Not Estab. ug/mL - - - - -  Micro/Creat Ratio 0 - 29 mg/g creat - - 12 - -  Chol 100 - 199 mg/dL - - 137 139 -  HDL >39 mg/dL - - 33(L) 26(L) -  Calc LDL 0 - 99 mg/dL - - 78 97 -  Triglycerides 0 - 149 mg/dL - - 147 79 -  Creatinine 0.44 - 1.00 mg/dL - 0.87 0.95 0.70 1.00   BP/Weight 02/21/2020 01/19/2020 10/28/2019 10/14/2019 10/14/2019 09/19/2019 7/59/1638  Systolic BP 466 599 357 017 793 903 009  Diastolic BP 75 70 70 78 83 84 88  Wt. (Lbs) 200.12 200 200.8 200.62 201 203 204.04  BMI 37.81 36.58 36.73 36.69 36.76 37.13 36.72   Foot/eye exam completion dates Latest Ref Rng & Units 01/02/2020 01/02/2020  Eye Exam No Retinopathy No Retinopathy No Retinopathy  Foot Form Completion - - -

## 2020-02-21 NOTE — Assessment & Plan Note (Signed)
Colonoscopy due 2022, asymptomatic

## 2020-02-21 NOTE — Assessment & Plan Note (Signed)
Improved, keep up gopod work  Patient re-educated about  the importance of commitment to a  minimum of 150 minutes of exercise per week as able.  The importance of healthy food choices with portion control discussed, as well as eating regularly and within a 12 hour window most days. The need to choose "clean , green" food 50 to 75% of the time is discussed, as well as to make water the primary drink and set a goal of 64 ounces water daily.    Weight /BMI 02/21/2020 01/19/2020 10/28/2019  WEIGHT 200 lb 1.9 oz 200 lb 200 lb 12.8 oz  HEIGHT 5\' 1"  5\' 2"  5\' 2"   BMI 37.81 kg/m2 36.58 kg/m2 36.73 kg/m2

## 2020-02-21 NOTE — Assessment & Plan Note (Signed)
Hyperlipidemia:Low fat diet discussed and encouraged.   Lipid Panel  Lab Results  Component Value Date   CHOL 137 09/20/2019   HDL 33 (L) 09/20/2019   LDLCALC 78 09/20/2019   TRIG 147 09/20/2019   CHOLHDL 5.3 06/03/2019

## 2020-02-21 NOTE — Assessment & Plan Note (Signed)
Controlled, no change in medication DASH diet and commitment to daily physical activity for a minimum of 30 minutes discussed and encouraged, as a part of hypertension management. The importance of attaining a healthy weight is also discussed.  BP/Weight 02/21/2020 01/19/2020 10/28/2019 10/14/2019 10/14/2019 09/19/2019 1/84/8592  Systolic BP 763 943 200 379 444 619 012  Diastolic BP 75 70 70 78 83 84 88  Wt. (Lbs) 200.12 200 200.8 200.62 201 203 204.04  BMI 37.81 36.58 36.73 36.69 36.76 37.13 36.72

## 2020-02-21 NOTE — Assessment & Plan Note (Deleted)
Ms. Hosman is reminded of the importance of commitment to daily physical activity for 30 minutes or more, as able and the need to limit carbohydrate intake to 30 to 60 grams per meal to help with blood sugar control.   The need to take medication as prescribed, test blood sugar as directed, and to call between visits if there is a concern that blood sugar is uncontrolled is also discussed.   Ms. Cocker is reminded of the importance of daily foot exam, annual eye examination, and good blood sugar, blood pressure and cholesterol control.  Diabetic Labs Latest Ref Rng & Units 10/14/2019 09/20/2019 06/03/2019 06/02/2019 06/02/2019  HbA1c 4.8 - 5.6 % - 6.3(H) - - 7.1(H)  Microalbumin Not Estab. ug/mL - - - - -  Micro/Creat Ratio 0 - 29 mg/g creat - 12 - - -  Chol 100 - 199 mg/dL - 137 139 - -  HDL >39 mg/dL - 33(L) 26(L) - -  Calc LDL 0 - 99 mg/dL - 78 97 - -  Triglycerides 0 - 149 mg/dL - 147 79 - -  Creatinine 0.44 - 1.00 mg/dL 0.87 0.95 0.70 1.00 0.94   BP/Weight 02/21/2020 01/19/2020 10/28/2019 10/14/2019 10/14/2019 09/19/2019 2/95/6213  Systolic BP 086 578 469 629 528 413 244  Diastolic BP 75 70 70 78 83 84 88  Wt. (Lbs) 200.12 200 200.8 200.62 201 203 204.04  BMI 37.81 36.58 36.73 36.69 36.76 37.13 36.72   Foot/eye exam completion dates Latest Ref Rng & Units 01/02/2020 01/02/2020  Eye Exam No Retinopathy No Retinopathy No Retinopathy  Foot Form Completion - - -

## 2020-02-21 NOTE — Progress Notes (Signed)
Samantha Vega     MRN: [id number]      DOB: 1951-04-02   HPI [name] is here for follow up and re-evaluation of chronic medical conditions, medication management and review of any available recent lab and radiology data.  Preventive health is updated, specifically  Cancer screening and Immunization.   Questions or concerns regarding consultations or procedures which the PT has had in the interim are  addressed. The PT denies any adverse reactions to current medications since the last visit.  There are no new concerns.  There are no specific complaints   ROS Denies recent fever or chills. Denies sinus pressure, nasal congestion, ear pain or sore throat. Denies chest congestion, productive cough or wheezing. Denies chest pains, palpitations and leg swelling Denies abdominal pain, nausea, vomiting,diarrhea or constipation.   Denies dysuria, frequency, hesitancy or incontinence. Denies joint pain, swelling and limitation in mobility. Denies headaches, seizures, numbness, or tingling. Denies depression, anxiety or insomnia. Denies skin break down or rash.   PE  BP 111/75   Pulse 98   Resp 16   Ht 5\' 1"  (1.549 m)   Wt 200 lb 1.9 oz (90.8 kg)   SpO2 98%   BMI 37.81 kg/m   Patient alert and oriented and in no cardiopulmonary distress.  HEENT: No facial asymmetry, EOMI,     Neck supple .  Chest: Clear to auscultation bilaterally.  CVS: S1, S2 no murmurs, no S3.Regular rate.  ABD: Soft non tender.   Ext: No edema  MS: Adequate ROM spine, shoulders, hips and knees.  Skin: Intact, no ulcerations or rash noted.  Psych: Good eye contact, normal affect. Memory intact not anxious or depressed appearing.  CNS: CN 2-12 intact, power,  normal throughout.no focal deficits noted.   Assessment & Plan  Essential hypertension Controlled, no change in medication DASH diet and commitment to daily physical activity for a minimum of 30 minutes discussed and encouraged, as a  part of hypertension management. The importance of attaining a healthy weight is also discussed.  BP/Weight 02/21/2020 01/19/2020 10/28/2019 10/14/2019 10/14/2019 09/19/2019 0/24/0973  Systolic BP 532 992 426 834 196 222 979  Diastolic BP 75 70 70 78 83 84 88  Wt. (Lbs) 200.12 200 200.8 200.62 201 203 204.04  BMI 37.81 36.58 36.73 36.69 36.76 37.13 36.72       Morbid obesity (HCC) Improved, keep up gopod work  Patient re-educated about  the importance of commitment to a  minimum of 150 minutes of exercise per week as able.  The importance of healthy food choices with portion control discussed, as well as eating regularly and within a 12 hour window most days. The need to choose "clean , green" food 50 to 75% of the time is discussed, as well as to make water the primary drink and set a goal of 64 ounces water daily.    Weight /BMI 02/21/2020 01/19/2020 10/28/2019  WEIGHT 200 lb 1.9 oz 200 lb 200 lb 12.8 oz  HEIGHT 5\' 1"  5\' 2"  5\' 2"   BMI 37.81 kg/m2 36.58 kg/m2 36.73 kg/m2      Tubular adenoma of colon Colonoscopy due 2022, asymptomatic  Hyperlipidemia LDL goal <70 Hyperlipidemia:Low fat diet discussed and encouraged.   Lipid Panel  Lab Results  Component Value Date   CHOL 137 09/20/2019   HDL 33 (L) 09/20/2019   LDLCALC 78 09/20/2019   TRIG 147 09/20/2019   CHOLHDL 5.3 06/03/2019       Type 2 diabetes mellitus  with vascular disease ([place]) [name] is reminded of the importance of commitment to daily physical activity for 30 minutes or more, as able and the need to limit carbohydrate intake to 30 to 60 grams per meal to help with blood sugar control.   The need to take medication as prescribed, test blood sugar as directed, and to call between visits if there is a concern that blood sugar is uncontrolled is also discussed.   [name] is reminded of the importance of daily foot exam, annual eye examination, and good blood sugar, blood pressure and cholesterol  control.  Diabetic Labs Latest Ref Rng & Units 02/21/2020 10/14/2019 09/20/2019 06/03/2019 06/02/2019  HbA1c 4.0 - 5.6 % 5.9(A) - 6.3(H) - -  Microalbumin Not Estab. ug/mL - - - - -  Micro/Creat Ratio 0 - 29 mg/g creat - - 12 - -  Chol 100 - 199 mg/dL - - 137 139 -  HDL >39 mg/dL - - 33(L) 26(L) -  Calc LDL 0 - 99 mg/dL - - 78 97 -  Triglycerides 0 - 149 mg/dL - - 147 79 -  Creatinine 0.44 - 1.00 mg/dL - 0.87 0.95 0.70 1.00   BP/Weight 02/21/2020 01/19/2020 10/28/2019 10/14/2019 10/14/2019 09/19/2019 0/22/3361  Systolic BP 224 497 530 051 102 111 735  Diastolic BP 75 70 70 78 83 84 88  Wt. (Lbs) 200.12 200 200.8 200.62 201 203 204.04  BMI 37.81 36.58 36.73 36.69 36.76 37.13 36.72   Foot/eye exam completion dates Latest Ref Rng & Units 01/02/2020 01/02/2020  Eye Exam No Retinopathy No Retinopathy No Retinopathy  Foot Form Completion - - -

## 2020-02-22 DIAGNOSIS — I10 Essential (primary) hypertension: Secondary | ICD-10-CM | POA: Diagnosis not present

## 2020-02-22 DIAGNOSIS — E785 Hyperlipidemia, unspecified: Secondary | ICD-10-CM | POA: Diagnosis not present

## 2020-02-22 DIAGNOSIS — E049 Nontoxic goiter, unspecified: Secondary | ICD-10-CM | POA: Diagnosis not present

## 2020-02-22 DIAGNOSIS — E559 Vitamin D deficiency, unspecified: Secondary | ICD-10-CM | POA: Diagnosis not present

## 2020-02-23 LAB — VITAMIN D 25 HYDROXY (VIT D DEFICIENCY, FRACTURES): Vit D, 25-Hydroxy: 26.3 ng/mL — ABNORMAL LOW (ref 30.0–100.0)

## 2020-02-23 LAB — CMP14+EGFR
ALT: 26 IU/L (ref 0–32)
AST: 21 IU/L (ref 0–40)
Albumin/Globulin Ratio: 1.2 (ref 1.2–2.2)
Albumin: 3.9 g/dL (ref 3.8–4.8)
Alkaline Phosphatase: 130 IU/L — ABNORMAL HIGH (ref 48–121)
BUN/Creatinine Ratio: 21 (ref 12–28)
BUN: 18 mg/dL (ref 8–27)
Bilirubin Total: 0.5 mg/dL (ref 0.0–1.2)
CO2: 23 mmol/L (ref 20–29)
Calcium: 9.3 mg/dL (ref 8.7–10.3)
Chloride: 104 mmol/L (ref 96–106)
Creatinine, Ser: 0.87 mg/dL (ref 0.57–1.00)
GFR calc Af Amer: 78 mL/min/{1.73_m2} (ref >59)
GFR calc non Af Amer: 68 mL/min/{1.73_m2} (ref >59)
Globulin, Total: 3.3 g/dL (ref 1.5–4.5)
Glucose: 106 mg/dL — ABNORMAL HIGH (ref 65–99)
Potassium: 4.1 mmol/L (ref 3.5–5.2)
Sodium: 140 mmol/L (ref 134–144)
Total Protein: 7.2 g/dL (ref 6.0–8.5)

## 2020-02-23 LAB — LIPID PANEL
Chol/HDL Ratio: 4.6 ratio — ABNORMAL HIGH (ref 0.0–4.4)
Cholesterol, Total: 166 mg/dL (ref 100–199)
HDL: 36 mg/dL — ABNORMAL LOW (ref >39)
LDL Chol Calc (NIH): 109 mg/dL — ABNORMAL HIGH (ref 0–99)
Triglycerides: 112 mg/dL (ref 0–149)
VLDL Cholesterol Cal: 21 mg/dL (ref 5–40)

## 2020-02-23 LAB — TSH: TSH: 1.04 u[IU]/mL (ref 0.450–4.500)

## 2020-03-15 ENCOUNTER — Telehealth (INDEPENDENT_AMBULATORY_CARE_PROVIDER_SITE_OTHER): Payer: Medicare Other | Admitting: Internal Medicine

## 2020-03-15 ENCOUNTER — Other Ambulatory Visit: Payer: Self-pay

## 2020-03-15 ENCOUNTER — Encounter: Payer: Self-pay | Admitting: Internal Medicine

## 2020-03-15 ENCOUNTER — Emergency Department (HOSPITAL_COMMUNITY)
Admission: EM | Admit: 2020-03-15 | Discharge: 2020-03-15 | Disposition: A | Discharge status: Home / Self Care | Payer: Medicare Other | Attending: Emergency Medicine | Admitting: Emergency Medicine

## 2020-03-15 VITALS — BP 111/75 | Ht 61.0 in | Wt 200.0 lb

## 2020-03-15 DIAGNOSIS — K921 Melena: Secondary | ICD-10-CM

## 2020-03-15 DIAGNOSIS — I10 Essential (primary) hypertension: Secondary | ICD-10-CM | POA: Insufficient documentation

## 2020-03-15 DIAGNOSIS — E1159 Type 2 diabetes mellitus with other circulatory complications: Secondary | ICD-10-CM | POA: Insufficient documentation

## 2020-03-15 DIAGNOSIS — R109 Unspecified abdominal pain: Secondary | ICD-10-CM | POA: Insufficient documentation

## 2020-03-15 DIAGNOSIS — I480 Paroxysmal atrial fibrillation: Secondary | ICD-10-CM | POA: Diagnosis not present

## 2020-03-15 DIAGNOSIS — Z79899 Other long term (current) drug therapy: Secondary | ICD-10-CM | POA: Diagnosis not present

## 2020-03-15 DIAGNOSIS — R197 Diarrhea, unspecified: Secondary | ICD-10-CM | POA: Diagnosis not present

## 2020-03-15 DIAGNOSIS — Z7901 Long term (current) use of anticoagulants: Secondary | ICD-10-CM

## 2020-03-15 DIAGNOSIS — I4891 Unspecified atrial fibrillation: Secondary | ICD-10-CM | POA: Diagnosis not present

## 2020-03-15 LAB — COMPREHENSIVE METABOLIC PANEL
ALT: 20 U/L (ref 0–44)
AST: 22 U/L (ref 15–41)
Albumin: 3.7 g/dL (ref 3.5–5.0)
Alkaline Phosphatase: 96 U/L (ref 38–126)
Anion gap: 9 (ref 5–15)
BUN: 34 mg/dL — ABNORMAL HIGH (ref 8–23)
CO2: 20 mmol/L — ABNORMAL LOW (ref 22–32)
Calcium: 9.2 mg/dL (ref 8.9–10.3)
Chloride: 105 mmol/L (ref 98–111)
Creatinine, Ser: 1.47 mg/dL — ABNORMAL HIGH (ref 0.44–1.00)
GFR calc Af Amer: 41 mL/min — ABNORMAL LOW (ref >60)
GFR calc non Af Amer: 36 mL/min — ABNORMAL LOW (ref >60)
Glucose, Bld: 112 mg/dL — ABNORMAL HIGH (ref 70–99)
Potassium: 3.8 mmol/L (ref 3.5–5.1)
Sodium: 134 mmol/L — ABNORMAL LOW (ref 135–145)
Total Bilirubin: 0.3 mg/dL (ref 0.3–1.2)
Total Protein: 8 g/dL (ref 6.5–8.1)

## 2020-03-15 LAB — CBC
HCT: 43 % (ref 36.0–46.0)
Hemoglobin: 13.5 g/dL (ref 12.0–15.0)
MCH: 28.2 pg (ref 26.0–34.0)
MCHC: 31.4 g/dL (ref 30.0–36.0)
MCV: 89.8 fL (ref 80.0–100.0)
Platelets: 255 10*3/uL (ref 150–400)
RBC: 4.79 MIL/uL (ref 3.87–5.11)
RDW: 14.9 % (ref 11.5–15.5)
WBC: 6.1 10*3/uL (ref 4.0–10.5)
nRBC: 0 % (ref 0.0–0.2)

## 2020-03-15 LAB — URINALYSIS, ROUTINE W REFLEX MICROSCOPIC
Bilirubin Urine: NEGATIVE
Glucose, UA: NEGATIVE mg/dL
Hgb urine dipstick: NEGATIVE
Ketones, ur: NEGATIVE mg/dL
Leukocytes,Ua: NEGATIVE
Nitrite: NEGATIVE
Protein, ur: NEGATIVE mg/dL
Specific Gravity, Urine: 1.02 (ref 1.005–1.030)
pH: 5 (ref 5.0–8.0)

## 2020-03-15 LAB — POC OCCULT BLOOD, ED: Fecal Occult Bld: NEGATIVE

## 2020-03-15 LAB — LIPASE, BLOOD: Lipase: 39 U/L (ref 11–51)

## 2020-03-15 NOTE — Discharge Instructions (Addendum)
Make sure you stay on a low fiber diet, and use Imodium as needed for diarrhea.  Take Tylenol if you have a fever.  See your doctor as needed for problems.

## 2020-03-15 NOTE — ED Provider Notes (Signed)
[facility] Provider Note   CSN: [id number] Arrival date & time: 03/15/20  1535     History Chief Complaint  Patient presents with  . Diarrhea    Samantha Vega is a 69 y.o. female.  HPI She presents for evaluation of fever and crampy abdominal pain, present for several days.  She states her stool is black in color.  She denies nausea, vomiting, fever, chills, shortness of breath, weakness or dizziness.  Last bowel movement was this morning and was large volume by her report.  No recent antibiotic treatment, no other known complaints, no known sick contacts.  There are no other known modifying factors.    Past Medical History:  Diagnosis Date  . Annual physical exam 06/07/2015  . Arthritis   . Baker's cyst of knee 10/04/2013  . Chest pain    Noncardiac; evaluated and treated in 2008  . Diabetes mellitus   . Dyspnea on exertion 10/14/2019  . Enlarged heart   . Hyperlipidemia   . Hypertension   . Metabolic syndrome X 09/15/270  . OA (osteoarthritis) of knee 05/05/2012   Left, takes tramadol on avg twice per week  . Obesity   . PAF (paroxysmal atrial fibrillation) ([place])   . Right bundle branch block    +Palpitations.   EKG 11/2006:  NSR, RBBB, LAFB, markedly delayed R-wave progression, no change; Echocardiogram in 10/2006-suboptimal quality, normal; Event recorder-PVCs, no significant arrhythmias or symptoms ;  . TIA (transient ischemic attack) 06/02/2019  . Unspecified visual loss 12/08/2008    Patient Active Problem List   Diagnosis Date Noted  . Type 2 diabetes mellitus with vascular disease ([place]) 08/10/2019  . Thyroid goiter 06/16/2019  . Tubular adenoma of colon 10/09/2015  . Chronic anticoagulation 09/16/2012  . Paroxysmal atrial fibrillation ([place]) 05/22/2012  . RBBB (right bundle branch block)   . Morbid obesity ([place])   . Essential hypertension 12/08/2008  . Hyperlipidemia LDL goal <70 12/06/2007    Past Surgical History:  Procedure  Laterality Date  . ABDOMINAL HYSTERECTOMY    . BREAST BIOPSY Left    benign  . CATARACT EXTRACTION W/PHACO Right 01/08/2016   Procedure: CATARACT EXTRACTION PHACO AND INTRAOCULAR LENS PLACEMENT (IOC);  Surgeon: Rutherford Guys, MD;  Location: [facility];  Service: Ophthalmology;  Laterality: Right;  CDE: 5.78  . CATARACT EXTRACTION W/PHACO Left 02/05/2016   Procedure: CATARACT EXTRACTION PHACO AND INTRAOCULAR LENS PLACEMENT LEFT EYE CDE=6.50;  Surgeon: Rutherford Guys, MD;  Location: [facility];  Service: Ophthalmology;  Laterality: Left;  . COLONOSCOPY  2006   Dr. Tamala Julian: Normal, repeat in 2009.  Marland Kitchen COLONOSCOPY N/A 10/08/2015   Procedure: COLONOSCOPY;  Surgeon: Danie Binder, MD;  Location: [facility];  Service: Endoscopy;  Laterality: N/A;  230 - moved to 1:30, spoke with pt   . POLYPECTOMY  10/08/2015   Procedure: POLYPECTOMY;  Surgeon: Danie Binder, MD;  Location: [facility];  Service: Endoscopy;;  cecal polyp removed via cold forceps with one clip placed/ rectal polyp removed via cold forcep  . SHOULDER SURGERY  2006   Left-post-trauma  . TOTAL ABDOMINAL HYSTERECTOMY W/ BILATERAL SALPINGOOPHORECTOMY       OB History   No obstetric history on file.     Family History  Problem Relation Age of Onset  . Hypertension Mother   . Arthritis Mother   . Stroke Father   . Hypertension Father   . Coronary artery disease Father   . Diabetes Sister   .  Sarcoidosis Sister   . Diabetes Sister   . Heart failure Brother   . Congestive Heart Failure Brother   . Colon cancer Neg Hx     Social History   Tobacco Use  . Smoking status: Never Smoker  . Smokeless tobacco: Never Used  Vaping Use  . Vaping Use: Never used  Substance Use Topics  . Alcohol use: No  . Drug use: No    Home Medications Prior to Admission medications   Medication Sig Start Date End Date Taking? Authorizing Provider  atorvastatin (LIPITOR) 80 MG tablet Take 1 tablet (80 mg total) by mouth daily. 06/03/19   Roxan Hockey, MD  benazepril (LOTENSIN) 20 MG tablet TAKE 1 TABLET BY MOUTH EVERY DAY 11/15/19   Fayrene Helper, MD  Cholecalciferol (VITAMIN D3 PO) Take 1,000 Units by mouth daily.    [provider]  diltiazem (CARDIZEM CD) 120 MG 24 hr capsule Take 1 capsule (120 mg total) by mouth daily. 06/03/19   Roxan Hockey, MD  furosemide (LASIX) 20 MG tablet TAKE 1 TABLET (20 MG TOTAL) BY MOUTH DAILY AS NEEDED FOR FLUID. 01/19/20   Arnoldo Lenis, MD  metoprolol tartrate (LOPRESSOR) 100 MG tablet TAKE 1 TABLET BY MOUTH TWICE A DAY 12/06/19   Fayrene Helper, MD  warfarin (COUMADIN) 5 MG tablet TAKE 1 TABLET DAILY EXCEPT 1/2 TABLET ON SUNDAY AND THURSDAY OR AS DIRECTED BY COUMADIN CLINIC 08/18/19   Fayrene Helper, MD    Allergies    Patient has no known allergies.  Review of Systems   Review of Systems  All other systems reviewed and are negative.   Physical Exam Updated Vital Signs BP 104/83   Pulse 80   Temp 98 F (36.7 C) (Oral)   Resp (!) 26   Ht 5\' 1"  (1.549 m)   Wt 90.7 kg   SpO2 94%   BMI 37.79 kg/m   Physical Exam Vitals and nursing note reviewed.  Constitutional:      General: She is not in acute distress.    Appearance: She is well-developed. She is not ill-appearing, toxic-appearing or diaphoretic.  HENT:     Head: Normocephalic and atraumatic.     Right Ear: External ear normal.     Left Ear: External ear normal.  Eyes:     Conjunctiva/sclera: Conjunctivae normal.     Pupils: Pupils are equal, round, and reactive to light.  Neck:     Trachea: Phonation normal.  Cardiovascular:     Rate and Rhythm: Normal rate and regular rhythm.     Heart sounds: Normal heart sounds.  Pulmonary:     Effort: Pulmonary effort is normal.     Breath sounds: Normal breath sounds.  Abdominal:     General: There is no distension.     Palpations: Abdomen is soft.     Tenderness: There is no abdominal tenderness.  Musculoskeletal:        General: Normal range of  motion.     Cervical back: Normal range of motion and neck supple.  Skin:    General: Skin is warm and dry.  Neurological:     Mental Status: She is alert and oriented to person, place, and time.     Cranial Nerves: No cranial nerve deficit.     Sensory: No sensory deficit.     Motor: No abnormal muscle tone.     Coordination: Coordination normal.  Psychiatric:        Mood and Affect:  Mood normal.        Behavior: Behavior normal.        Thought Content: Thought content normal.        Judgment: Judgment normal.     ED Results / Procedures / Treatments   Labs (all labs ordered are listed, but only abnormal results are displayed) Labs Reviewed  COMPREHENSIVE METABOLIC PANEL - Abnormal; Notable for the following components:      Result Value   Sodium 134 (*)    CO2 20 (*)    Glucose, Bld 112 (*)    BUN 34 (*)    Creatinine, Ser 1.47 (*)    GFR calc non Af Amer 36 (*)    GFR calc Af Amer 41 (*)    All other components within normal limits  URINALYSIS, ROUTINE W REFLEX MICROSCOPIC - Abnormal; Notable for the following components:   APPearance CLOUDY (*)    All other components within normal limits  LIPASE, BLOOD  CBC  POC OCCULT BLOOD, ED    EKG EKG Interpretation  Date/Time:  Thursday March 15 2020 16:03:39 EDT Ventricular Rate:  102 PR Interval:    QRS Duration: 142 QT Interval:  416 QTC Calculation: 542 R Axis:   -52 Text Interpretation: Atrial fibrillation with rapid ventricular response with premature ventricular or aberrantly conducted complexes Left axis deviation Right bundle branch block Possible Lateral infarct , age undetermined Abnormal ECG Since last tracing rate slower Otherwise no significant change Confirmed by Daleen Bo [phone]) on 03/15/2020 4:21:24 PM   Radiology No results found.  Procedures Procedures (including critical care time)  Medications Ordered in ED Medications - No data to display  ED Course  I have reviewed the triage  vital signs and the nursing notes.  Pertinent labs & imaging results that were available during my care of the patient were reviewed by me and considered in my medical decision making (see chart for details).  Clinical Course as of Mar 15 1949  Thu Mar 15, 2020  1949 Normal  POC occult blood, ED RN will collect [EW]  1949 Normal  Lipase, blood [EW]  1949 Normal  CBC [EW]  1949 Normal  Urinalysis, Routine w reflex microscopic Urine, Clean Catch(!) [EW]  1949 Normal except sodium low, CO2 low, glucose high, BUN high, creatinine high  Comprehensive metabolic panel(!) [EW]    Clinical Course User Index [EW] Daleen Bo, MD   MDM Rules/Calculators/A&P                           Patient Vitals for the past 24 hrs:  BP Temp Temp src Pulse Resp SpO2 Height Weight  03/15/20 1700 104/83 -- -- 80 (!) 26 94 % -- --  03/15/20 1645 108/77 -- -- 85 (!) 29 99 % -- --  03/15/20 1603 -- -- -- -- -- -- 5\' 1"  (1.549 m) 90.7 kg  03/15/20 1601 117/83 98 F (36.7 C) Oral 94 18 100 % -- --    7:50 PM Reevaluation with update and discussion. After initial assessment and treatment, an updated evaluation reveals no change in clinical status.  Findings discussed with the patient and all questions were answered. Daleen Bo   Medical Decision Making:  This patient is presenting for evaluation of diarrhea and crampy abdominal pain, which does require a range of treatment options, and is a complaint that involves a moderate risk of morbidity and mortality. The differential diagnoses include gastroenteritis, GI bleeding. I decided  to review old records, and in summary healthy elderly female, with short-term illness characterized primarily by diarrhea.  I did not require additional historical information from anyone.  Clinical Laboratory Tests Ordered, included CBC, Metabolic panel and Lipase. Review indicates reassuring labs with probably mild dehydration, causing mild renal insufficiency.   Critical  Interventions-clinical evaluation, laboratory testing, observation reassessment  After These Interventions, the Patient was reevaluated and was found stable for discharge.  Nonspecific symptoms, mild nature, with diarrhea for 3 days, improving after treatment with Imodium at home.  No evidence for acute bacterial infection, metabolic instability, rectal bleeding or impending vascular collapse.  CRITICAL CARE-no Performed by: Daleen Bo  Nursing Notes Reviewed/ Care Coordinated Applicable Imaging Reviewed Interpretation of Laboratory Data incorporated into ED treatment  The patient appears reasonably screened and/or stabilized for discharge and I doubt any other medical condition or other [facility] requiring further screening, evaluation, or treatment in the ED at this time prior to discharge.  Plan: Home Medications-continue usual, Imodium as needed; Home Treatments-low fiber diet, push fluids; return here if the recommended treatment, does not improve the symptoms; Recommended follow up-PCP, as needed      Final Clinical Impression(s) / ED Diagnoses Final diagnoses:  None    Rx / DC Orders ED Discharge Orders    None       Daleen Bo, MD 03/15/20 2016

## 2020-03-15 NOTE — ED Triage Notes (Addendum)
Pt. States they have only had diarrhea for 3 days. Pt. Also states they started to have chills on Saturday. Pt. States they called their PCP and their PCP told them to come to the ER. Pt. States their diarrhea turned black 2 days ago. Pt. Also stated they feel hot and short of breath. Their HR jumped from 90-111.

## 2020-03-15 NOTE — Patient Instructions (Signed)
You are advised to go to emergency room as soon as possible for evaluation of GI bleeding. You are at high risk of GI bleeding because of history of blood thinner.  We have informed the ER Physician about your expected arrival.  Please do not take Warfarin till you get evaluated in ER.

## 2020-03-15 NOTE — Progress Notes (Signed)
Virtual Visit via Telephone Note   This visit type was conducted due to national recommendations for restrictions regarding the COVID-19 Pandemic (e.g. social distancing) in an effort to limit this patient's exposure and mitigate transmission in our community.  Due to her co-morbid illnesses, this patient is at least at moderate risk for complications without adequate follow up.  This format is felt to be most appropriate for this patient at this time.  The patient did not have access to video technology/had technical difficulties with video requiring transitioning to audio format only (telephone).  All issues noted in this document were discussed and addressed.  No physical exam could be performed with this format.  Evaluation Performed:  Follow-up visit  Date:  03/15/2020   ID:  Samantha, Vega 29-Jul-1950, MRN [id number]  Patient Location: Home Provider Location: Office/Clinic  Location of Patient: Home Location of Provider: Telehealth Consent was obtain for visit to be over via telehealth. I verified that I am speaking with the correct person using two identifiers.  PCP:  Fayrene Helper, MD   Chief Complaint:  Diarrhea  History of Present Illness:    Samantha Vega is a 69 y.o. female with hypertension, paroxysmal atrial fibrillation on warfarin, hyperlipidemia and type II DM has a televisit for complaint of diarrhea.  She states that she has been having loose stools for the last 4 days.  She took milk of magnesia yesterday for her upset stomach, after which she had a large BM, black-colored.  She started to have fatigue and could not walk in the house after that.  She also complains of mild shortness of breath since. She has not checked her BP at home.  She denies cough or hemoptysis.  She denies fever, chills, nausea or vomiting.  Patient was told to report to ER for immediate evaluation of GI bleeding.  The patient does not have symptoms concerning for COVID-19  infection (fever, chills, cough, or new shortness of breath).   Past Medical, Surgical, Social History, Allergies, and Medications have been Reviewed.  Past Medical History:  Diagnosis Date  . Annual physical exam 06/07/2015  . Arthritis   . Baker's cyst of knee 10/04/2013  . Chest pain    Noncardiac; evaluated and treated in 2008  . Diabetes mellitus   . Dyspnea on exertion 10/14/2019  . Enlarged heart   . Hyperlipidemia   . Hypertension   . Metabolic syndrome X 3/76/2831  . OA (osteoarthritis) of knee 05/05/2012   Left, takes tramadol on avg twice per week  . Obesity   . PAF (paroxysmal atrial fibrillation) ([place])   . Right bundle branch block    +Palpitations.   EKG 11/2006:  NSR, RBBB, LAFB, markedly delayed R-wave progression, no change; Echocardiogram in 10/2006-suboptimal quality, normal; Event recorder-PVCs, no significant arrhythmias or symptoms ;  . TIA (transient ischemic attack) 06/02/2019  . Unspecified visual loss 12/08/2008   Past Surgical History:  Procedure Laterality Date  . ABDOMINAL HYSTERECTOMY    . BREAST BIOPSY Left    benign  . CATARACT EXTRACTION W/PHACO Right 01/08/2016   Procedure: CATARACT EXTRACTION PHACO AND INTRAOCULAR LENS PLACEMENT (IOC);  Surgeon: Rutherford Guys, MD;  Location: [facility];  Service: Ophthalmology;  Laterality: Right;  CDE: 5.78  . CATARACT EXTRACTION W/PHACO Left 02/05/2016   Procedure: CATARACT EXTRACTION PHACO AND INTRAOCULAR LENS PLACEMENT LEFT EYE CDE=6.50;  Surgeon: Rutherford Guys, MD;  Location: [facility];  Service: Ophthalmology;  Laterality: Left;  . COLONOSCOPY  2006   Dr. Tamala Julian: Normal, repeat in 2009.  Marland Kitchen COLONOSCOPY N/A 10/08/2015   Procedure: COLONOSCOPY;  Surgeon: Danie Binder, MD;  [facility];  Service: Endoscopy;  Laterality: N/A;  230 - moved to 1:30, spoke with pt   . POLYPECTOMY  10/08/2015   Procedure: POLYPECTOMY;  Surgeon: Danie Binder, MD;  [facility];  Service: Endoscopy;;  cecal polyp removed  via cold forceps with one clip placed/ rectal polyp removed via cold forcep  . SHOULDER SURGERY  2006   Left-post-trauma  . TOTAL ABDOMINAL HYSTERECTOMY W/ BILATERAL SALPINGOOPHORECTOMY       Current Meds  Medication Sig  . atorvastatin (LIPITOR) 80 MG tablet Take 1 tablet (80 mg total) by mouth daily.  . benazepril (LOTENSIN) 20 MG tablet TAKE 1 TABLET BY MOUTH EVERY DAY  . Cholecalciferol (VITAMIN D3 PO) Take 1,000 Units by mouth daily.  Marland Kitchen diltiazem (CARDIZEM CD) 120 MG 24 hr capsule Take 1 capsule (120 mg total) by mouth daily.  . furosemide (LASIX) 20 MG tablet TAKE 1 TABLET (20 MG TOTAL) BY MOUTH DAILY AS NEEDED FOR FLUID.  Marland Kitchen metoprolol tartrate (LOPRESSOR) 100 MG tablet TAKE 1 TABLET BY MOUTH TWICE A DAY  . warfarin (COUMADIN) 5 MG tablet TAKE 1 TABLET DAILY EXCEPT 1/2 TABLET ON SUNDAY AND THURSDAY OR AS DIRECTED BY COUMADIN CLINIC     Allergies:   Patient has no known allergies.   ROS:   Please see the history of present illness.    Review of Systems  Constitutional: Negative for chills and fever.  HENT: Negative for congestion, sinus pain and sore throat.   Eyes: Negative for pain and redness.  Respiratory: Positive for shortness of breath. Negative for cough and hemoptysis.   Cardiovascular: Negative for chest pain and palpitations.  Gastrointestinal: Positive for diarrhea and melena. Negative for abdominal pain, nausea and vomiting.  Genitourinary: Negative for dysuria and hematuria.  Musculoskeletal: Negative for neck pain.  Neurological: Positive for weakness. Negative for headaches.      Labs/Other Tests and Data Reviewed:    Recent Labs: 06/03/2019: Magnesium 1.9 10/14/2019: B Natriuretic Peptide 227.0; Hemoglobin 12.4; Platelets 188 02/22/2020: ALT 26; BUN 18; Creatinine, Ser 0.87; Potassium 4.1; Sodium 140; TSH 1.040   Recent Lipid Panel Lab Results  Component Value Date/Time   CHOL 166 02/22/2020 10:08 AM   TRIG 112 02/22/2020 10:08 AM   HDL 36 (L)  02/22/2020 10:08 AM   CHOLHDL 4.6 (H) 02/22/2020 10:08 AM   CHOLHDL 5.3 06/03/2019 05:01 AM   LDLCALC 109 (H) 02/22/2020 10:08 AM   LDLCALC 85 02/25/2018 08:47 AM    Wt Readings from Last 3 Encounters:  03/15/20 200 lb (90.7 kg)  02/21/20 200 lb 1.9 oz (90.8 kg)  01/19/20 200 lb (90.7 kg)     Objective:    Vital Signs:  BP 111/75   Ht 5\' 1"  (1.549 m)   Wt 200 lb (90.7 kg)   BMI 37.79 kg/m    ASSESSMENT & PLAN:    Melena r/o acute GI bleeding High risk of GI bleeding due to h/o Warfarin Recently having loose stools, denies noticing fresh blood in stool Referred to ER for urgent evaluation for active GI bleeding as patient is having fatigue and shortness of breath Informed ER physician about patient  Paroxysmal A Fib Advised to stop Warfarin until further evaluation in ER On Diltiazem and Metoprolol for rate-control  Diuretic use On Furosemide for leg swelling and HTN Advised to  stop taking Furosemide for now in the setting of diarrhea  Time:   Today, I have spent 20 minutes with the patient with telehealth technology discussing the above problems.   Medication Adjustments/Labs and Tests Ordered: Current medicines are reviewed at length with the patient today.  Concerns regarding medicines are outlined above.   Tests Ordered: No orders of the defined types were placed in this encounter.   Medication Changes: No orders of the defined types were placed in this encounter.   Disposition:  Follow up  Signed, Lindell Spar, MD  03/15/2020 3:22 PM     [facility]

## 2020-03-15 NOTE — ED Notes (Signed)
Pt. States suddenly feeling hot and short of breath just happened today.

## 2020-03-16 ENCOUNTER — Other Ambulatory Visit: Payer: Self-pay | Admitting: Family Medicine

## 2020-03-22 ENCOUNTER — Telehealth: Payer: Self-pay

## 2020-03-22 ENCOUNTER — Other Ambulatory Visit: Payer: Self-pay | Admitting: *Deleted

## 2020-03-22 MED ORDER — DILTIAZEM HCL ER COATED BEADS 120 MG PO CP24: 120.0000 mg | ORAL_CAPSULE | Freq: Every day | ORAL | 5 refills | Status: DC

## 2020-03-22 NOTE — Telephone Encounter (Signed)
Please call in Diltiazem 24h to [facility] [address]

## 2020-03-22 NOTE — Telephone Encounter (Signed)
Prescription sent to pharmacy.

## 2020-03-26 ENCOUNTER — Ambulatory Visit (INDEPENDENT_AMBULATORY_CARE_PROVIDER_SITE_OTHER): Payer: Medicare Other | Admitting: *Deleted

## 2020-03-26 DIAGNOSIS — Z5181 Encounter for therapeutic drug level monitoring: Secondary | ICD-10-CM | POA: Diagnosis not present

## 2020-03-26 DIAGNOSIS — I480 Paroxysmal atrial fibrillation: Secondary | ICD-10-CM

## 2020-03-26 LAB — POCT INR: INR: 2 (ref 2.0–3.0)

## 2020-03-26 NOTE — Patient Instructions (Signed)
Continue warfarin 1/2 tablet daily except 1 tablet on Mondays, Wednesdays and Fridays Recheck in 6 weeks 

## 2020-04-29 IMAGING — US US THYROID
1 series · 12 of 25 positions shown · non-contrast
Comparison: 04/17/2016

CLINICAL DATA: Thyroid nodules.

EXAM:
THYROID ULTRASOUND
TECHNIQUE: Ultrasound examination of the thyroid gland and adjacent soft
tissues was performed.

[Series 1: us thyroid · 0.06mm/px · 12 of 105 slices shown]
[im 5/105]
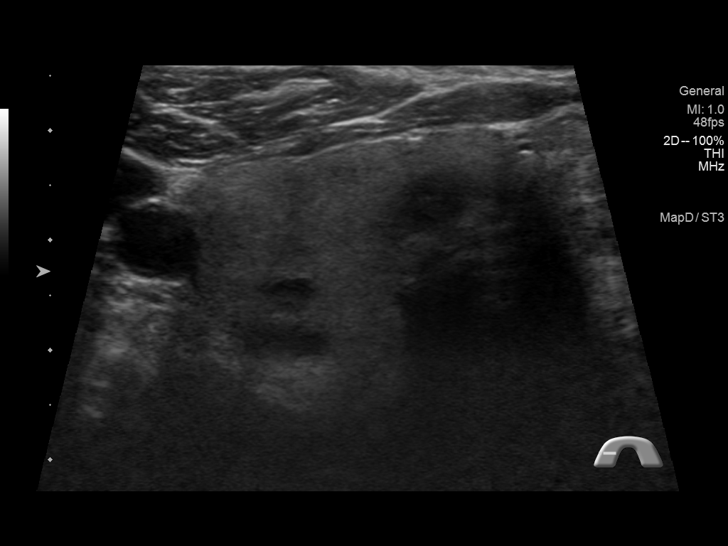
[im 14/105]
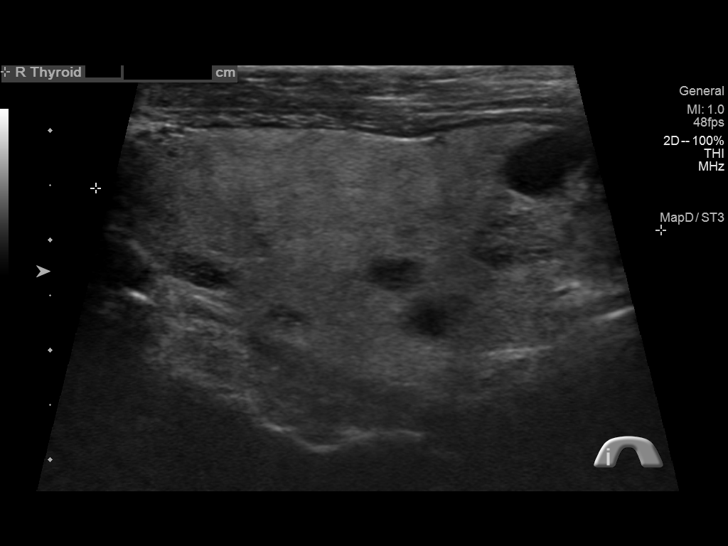
[im 22/105]
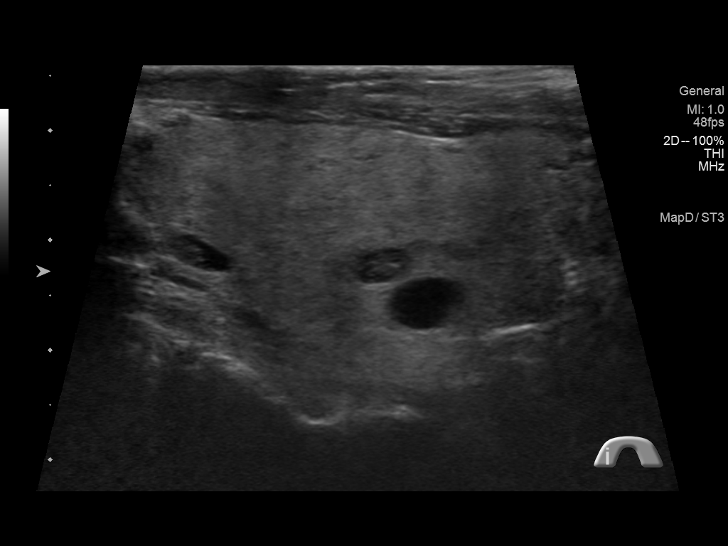
[im 31/105]
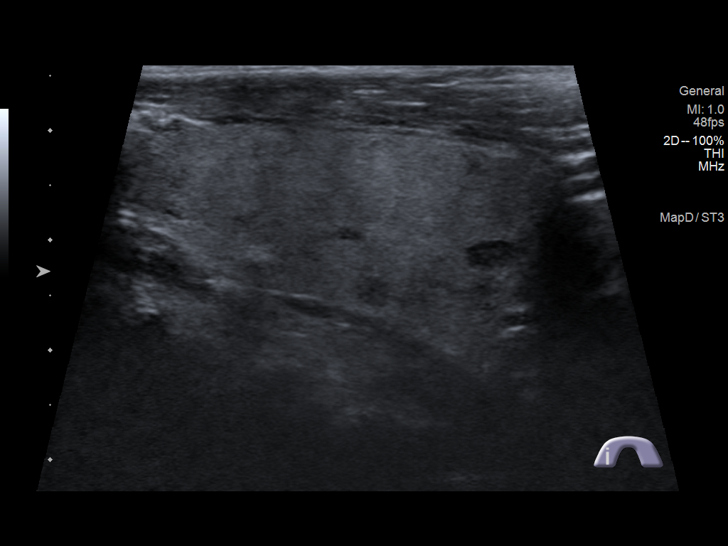
[im 40/105]
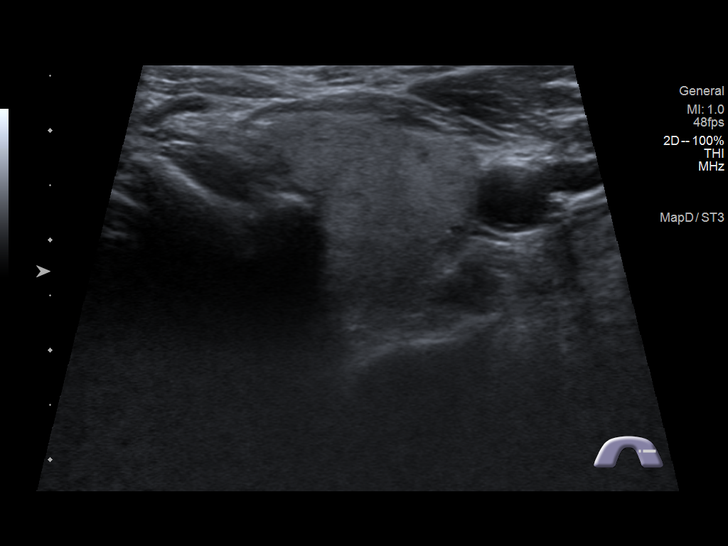
[im 48/105]
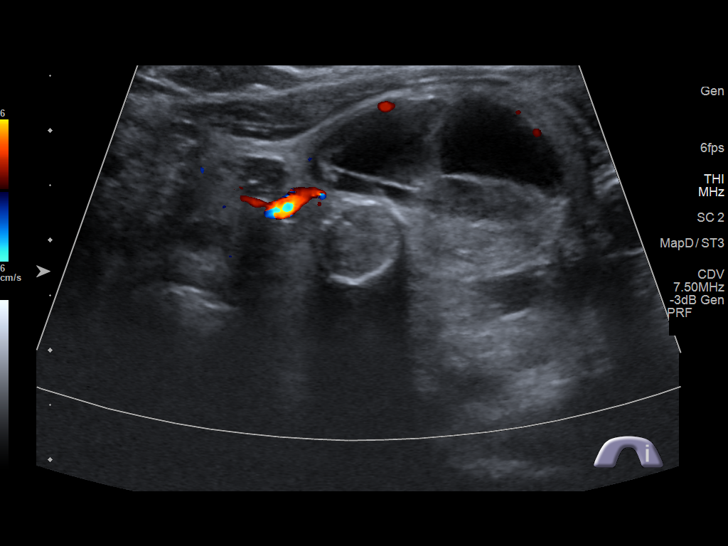
[im 57/105]
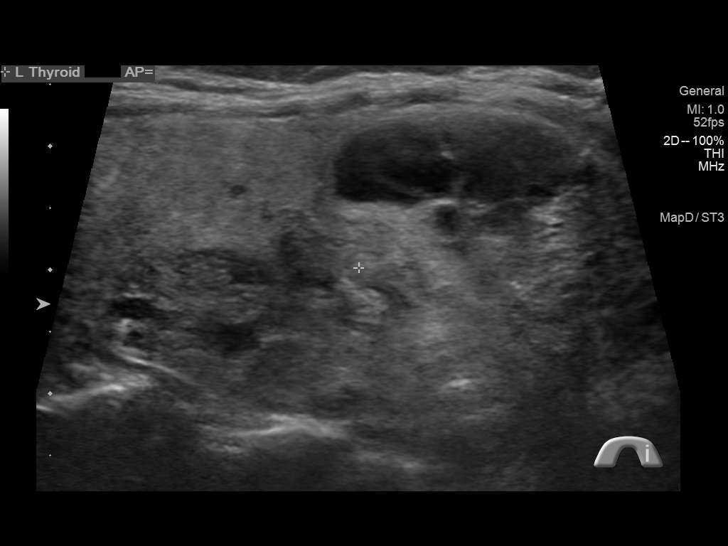
[im 66/105]
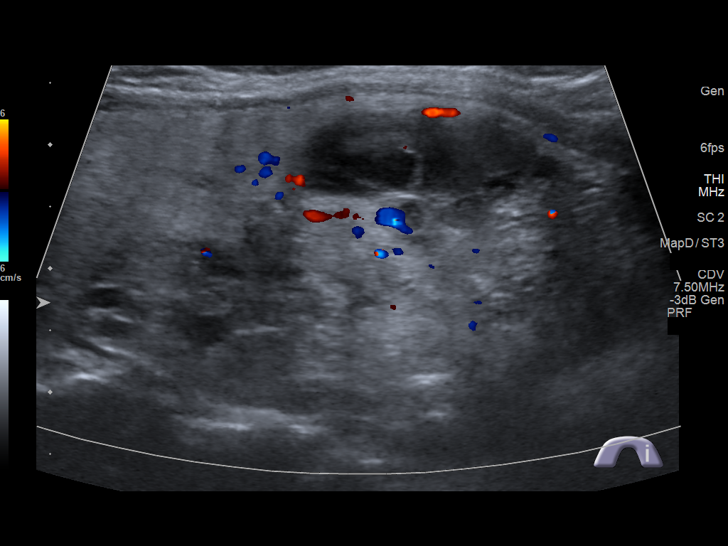
[im 74/105]
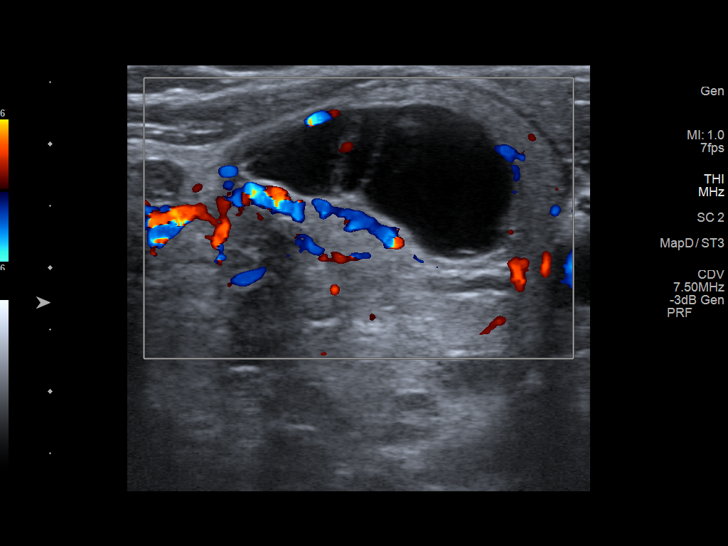
[im 83/105]
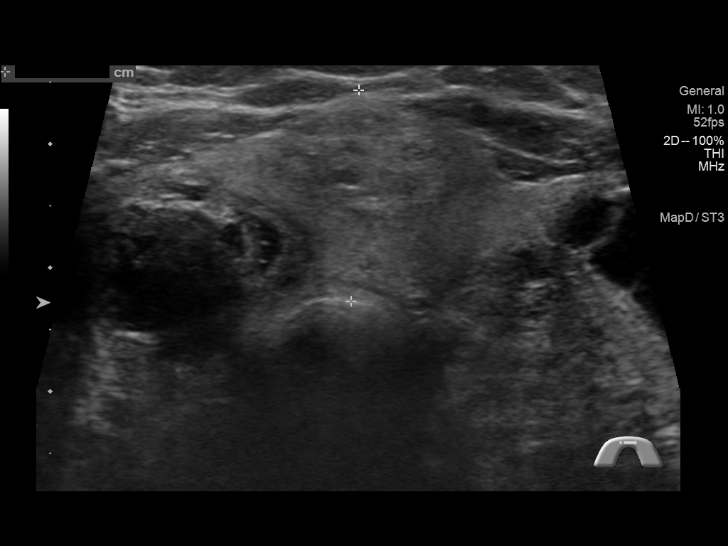
[im 92/105]
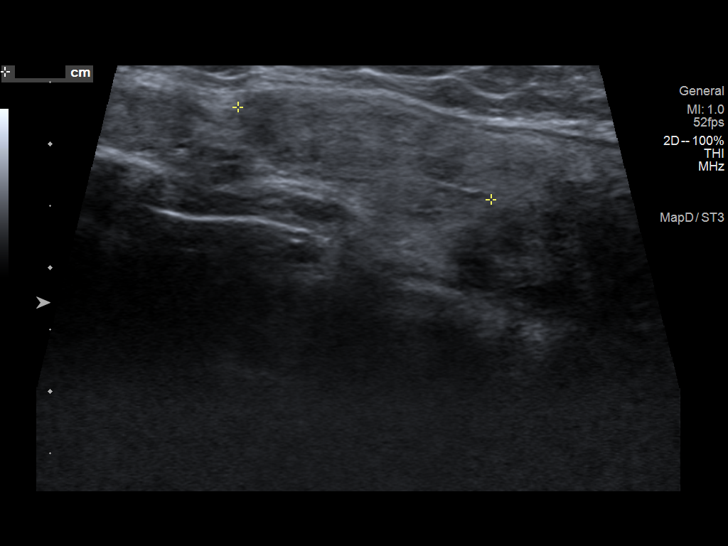
[im 100/105]
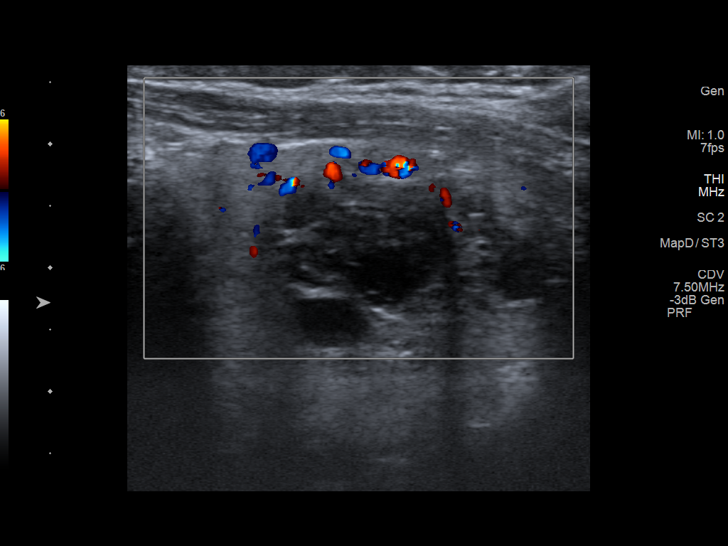

[12 of 25 positions shown; findings below may reference images not displayed]

FINDINGS: Parenchymal Echotexture: Mildly heterogenous

Isthmus: 1.7 cm

Right lobe: 5.2 x 2.1 x 2.4 cm

Left lobe: 5.5 x 3.1 x 2.4 cm

_________________________________________________________

Estimated total number of nodules >/= 1 cm: 1

Number of spongiform nodules >/=  2 cm not described below (TR1): 0

Number of mixed cystic and solid nodules >/= 1.5 cm not described
below (TR2): 0

_________________________________________________________

There is a small 0.7 cm cystic nodule in the right thyroid gland.
There is a 0.8 cm stable isoechoic nodule in the left mid thyroid
gland.

Nodule # 1:

Prior biopsy: Yes

Location: Isthmus; Superior

Maximum size: 2.1 cm; Other 2 dimensions: 1.3 x 2.3 cm, previously,
2.2 x 1 x 1.4 cm

Composition: solid/almost completely solid (2)

Echogenicity: isoechoic (1)

Shape: not taller-than-wide (0)

Margins: smooth (0)

Echogenic foci: none (0)

ACR TI-RADS total points: 3.

ACR TI-RADS risk category:  TR3 (3 points).

Significant change in size (>/= 20% in two dimensions and minimal
increase of 2 mm): No

Change in features: No

Change in ACR TI-RADS risk category: No

ACR TI-RADS recommendations:

*Given size (>/= 1.5 - 2.4 cm) and appearance, a follow-up
ultrasound in 1 year should be considered based on TI-RADS criteria.

_________________________________________________________

Nodule # 2:

Prior biopsy: Yes

Location: Isthmus; Inferior

Maximum size: 2.2 cm; Other 2 dimensions: 1.2 x 2.3 cm, previously,
1.9 x 1.5 x 2.3 cm

Composition: mixed cystic and solid (1)

Echogenicity: hypoechoic (2)

Shape: not taller-than-wide (0)

Margins: smooth (0)

Echogenic foci: none (0)

ACR TI-RADS total points: 2.

ACR TI-RADS risk category:  TR3 (3 points).

Significant change in size (>/= 20% in two dimensions and minimal
increase of 2 mm): No

Change in features: No

Change in ACR TI-RADS risk category: No

ACR TI-RADS recommendations:

*Given size (>/= 1.5 - 2.4 cm) and appearance, a follow-up
ultrasound in 1 year should be considered based on TI-RADS criteria.

_________________________________________________________

Nodule # 5:

Prior biopsy: Yes

Location: Left; Mid

Maximum size: 2.6 cm; Other 2 dimensions: 2.7 x 1.4 cm, previously,
2 x 1.7 x 1.8 cm

Composition: mixed cystic and solid (1)

Echogenicity: isoechoic (1)

Shape: not taller-than-wide (0)

Margins:

Echogenic foci: none (0)

ACR TI-RADS total points: 2.

ACR TI-RADS risk category:  TR2 (2 points).

Significant change in size (>/= 20% in two dimensions and minimal
increase of 2 mm): Yes

Change in features: Yes

Change in ACR TI-RADS risk category: Yes

ACR TI-RADS recommendations:

This nodule does NOT meet TI-RADS criteria for biopsy or dedicated
follow-up.

_________________________________________________________

There are no pathologically enlarged lymph nodes.
IMPRESSION: 1. Heterogeneous enlarged thyroid gland as detailed above.
2. The there are stable approximately 2 cm thyroid nodules in the
isthmus of the thyroid gland. These have now demonstrated
approximately 5 years of stability and as such no further follow-up
is required. Additionally, these were previously biopsied in 0974.
3. Slight interval increase in size of a dominant left-sided thyroid
nodule. Despite this, the lesion has been down graded to a TR 2
thyroid nodule given the significant interval increase in cystic
components. As such, no further follow-up is required.

The above is in keeping with the ACR TI-RADS recommendations - [HOSPITAL] 8715;[DATE].

## 2020-05-07 ENCOUNTER — Ambulatory Visit (INDEPENDENT_AMBULATORY_CARE_PROVIDER_SITE_OTHER): Payer: Medicare Other | Admitting: *Deleted

## 2020-05-07 DIAGNOSIS — I480 Paroxysmal atrial fibrillation: Secondary | ICD-10-CM

## 2020-05-07 DIAGNOSIS — Z5181 Encounter for therapeutic drug level monitoring: Secondary | ICD-10-CM | POA: Diagnosis not present

## 2020-05-07 LAB — POCT INR: INR: 1.5 — AB (ref 2.0–3.0)

## 2020-05-07 NOTE — Patient Instructions (Signed)
Take warfarin 1 1/2 tablets today, 1 tablet tomorrow then increase dose to 1 tablet daily except 1/2 tablet on Sundays, Tuesdays and Thursdays Recheck in 2 weeks

## 2020-05-21 ENCOUNTER — Ambulatory Visit (INDEPENDENT_AMBULATORY_CARE_PROVIDER_SITE_OTHER): Payer: Medicare Other | Admitting: *Deleted

## 2020-05-21 DIAGNOSIS — I480 Paroxysmal atrial fibrillation: Secondary | ICD-10-CM

## 2020-05-21 DIAGNOSIS — Z5181 Encounter for therapeutic drug level monitoring: Secondary | ICD-10-CM | POA: Diagnosis not present

## 2020-05-21 LAB — POCT INR: INR: 2.1 (ref 2.0–3.0)

## 2020-05-21 NOTE — Patient Instructions (Signed)
Continue warfarin 1 tablet daily except 1/2 tablet on Sundays, Tuesdays and Thursdays.  Recheck in 4 weeks 

## 2020-05-30 DIAGNOSIS — E119 Type 2 diabetes mellitus without complications: Secondary | ICD-10-CM | POA: Diagnosis not present

## 2020-05-30 DIAGNOSIS — Z0100 Encounter for examination of eyes and vision without abnormal findings: Secondary | ICD-10-CM | POA: Diagnosis not present

## 2020-06-04 ENCOUNTER — Other Ambulatory Visit: Payer: Self-pay | Admitting: Family Medicine

## 2020-06-20 ENCOUNTER — Ambulatory Visit (INDEPENDENT_AMBULATORY_CARE_PROVIDER_SITE_OTHER): Payer: Medicare Other | Admitting: *Deleted

## 2020-06-20 DIAGNOSIS — I480 Paroxysmal atrial fibrillation: Secondary | ICD-10-CM

## 2020-06-20 DIAGNOSIS — Z5181 Encounter for therapeutic drug level monitoring: Secondary | ICD-10-CM

## 2020-06-20 LAB — POCT INR: INR: 3 (ref 2.0–3.0)

## 2020-06-20 NOTE — Patient Instructions (Signed)
Continue warfarin 1 tablet daily except 1/2 tablet on Sundays, Tuesdays and Thursdays.  Recheck in 4 weeks 

## 2020-07-18 ENCOUNTER — Ambulatory Visit (INDEPENDENT_AMBULATORY_CARE_PROVIDER_SITE_OTHER): Payer: Medicare Other | Admitting: *Deleted

## 2020-07-18 DIAGNOSIS — Z5181 Encounter for therapeutic drug level monitoring: Secondary | ICD-10-CM | POA: Diagnosis not present

## 2020-07-18 DIAGNOSIS — I480 Paroxysmal atrial fibrillation: Secondary | ICD-10-CM

## 2020-07-18 LAB — POCT INR: INR: 2.3 (ref 2.0–3.0)

## 2020-07-18 NOTE — Patient Instructions (Signed)
Continue warfarin 1 tablet daily except 1/2 tablet on Sundays, Tuesdays and Thursdays.  Recheck in 6 weeks 

## 2020-07-24 ENCOUNTER — Other Ambulatory Visit: Payer: Self-pay

## 2020-07-24 ENCOUNTER — Ambulatory Visit (INDEPENDENT_AMBULATORY_CARE_PROVIDER_SITE_OTHER): Payer: Medicare Other | Admitting: Nurse Practitioner

## 2020-07-24 ENCOUNTER — Encounter: Payer: Self-pay | Admitting: Nurse Practitioner

## 2020-07-24 VITALS — BP 121/85 | HR 68 | Temp 98.5°F | Resp 20 | Ht 62.0 in | Wt 207.0 lb

## 2020-07-24 DIAGNOSIS — E049 Nontoxic goiter, unspecified: Secondary | ICD-10-CM | POA: Diagnosis not present

## 2020-07-24 DIAGNOSIS — I10 Essential (primary) hypertension: Secondary | ICD-10-CM | POA: Diagnosis not present

## 2020-07-24 DIAGNOSIS — Z0000 Encounter for general adult medical examination without abnormal findings: Secondary | ICD-10-CM | POA: Diagnosis not present

## 2020-07-24 DIAGNOSIS — I480 Paroxysmal atrial fibrillation: Secondary | ICD-10-CM

## 2020-07-24 DIAGNOSIS — E785 Hyperlipidemia, unspecified: Secondary | ICD-10-CM | POA: Diagnosis not present

## 2020-07-24 DIAGNOSIS — E1159 Type 2 diabetes mellitus with other circulatory complications: Secondary | ICD-10-CM

## 2020-07-24 NOTE — Patient Instructions (Signed)
It was great meeting you today.  We will check your fasting labs today, and call you with results.  Your physical today was great and you are up to date on your preventative health maintenance.

## 2020-07-24 NOTE — Assessment & Plan Note (Signed)
-  coumadin managed by cardiology -metoprolol and cardizem for rate control

## 2020-07-24 NOTE — Assessment & Plan Note (Signed)
-  checking labs today -takes atorvastatin 80 mg daily

## 2020-07-24 NOTE — Assessment & Plan Note (Addendum)
-  Jarrett Soho ordered U/S at a previous visit, no results to view -will check thyroid labs with next set of labs

## 2020-07-24 NOTE — Progress Notes (Signed)
Established Patient Office Visit  Subjective:  Patient ID: Samantha Vega, female    DOB: [birth year]  Age: 70 y.o. MRN: [id number]  CC:  Chief Complaint  Patient presents with  . Annual Exam    HPI Samantha Vega presents for annual physical exam. No recent labs. No acute issues. Past Medical History:  Diagnosis Date  . Annual physical exam 06/07/2015  . Arthritis   . Baker's cyst of knee 10/04/2013  . Chest pain    Noncardiac; evaluated and treated in 2008  . Diabetes mellitus   . Dyspnea on exertion 10/14/2019  . Enlarged heart   . Hyperlipidemia   . Hypertension   . Metabolic syndrome X 09/04/252  . OA (osteoarthritis) of knee 05/05/2012   Left, takes tramadol on avg twice per week  . Obesity   . PAF (paroxysmal atrial fibrillation) ([place])   . Right bundle branch block    +Palpitations.   EKG 11/2006:  NSR, RBBB, LAFB, markedly delayed R-wave progression, no change; Echocardiogram in 10/2006-suboptimal quality, normal; Event recorder-PVCs, no significant arrhythmias or symptoms ;  . TIA (transient ischemic attack) 06/02/2019  . Unspecified visual loss 12/08/2008    Past Surgical History:  Procedure Laterality Date  . ABDOMINAL HYSTERECTOMY    . BREAST BIOPSY Left    benign  . CATARACT EXTRACTION W/PHACO Right 01/08/2016   Procedure: CATARACT EXTRACTION PHACO AND INTRAOCULAR LENS PLACEMENT (IOC);  Surgeon: Rutherford Guys, MD;  Location: [facility];  Service: Ophthalmology;  Laterality: Right;  CDE: 5.78  . CATARACT EXTRACTION W/PHACO Left 02/05/2016   Procedure: CATARACT EXTRACTION PHACO AND INTRAOCULAR LENS PLACEMENT LEFT EYE CDE=6.50;  Surgeon: Rutherford Guys, MD;  Location: [facility];  Service: Ophthalmology;  Laterality: Left;  . COLONOSCOPY  2006   Dr. Tamala Julian: Normal, repeat in 2009.  Marland Kitchen COLONOSCOPY N/A 10/08/2015   Procedure: COLONOSCOPY;  Surgeon: Danie Binder, MD;  Location: [facility];  Service: Endoscopy;  Laterality: N/A;  230 - moved to 1:30, spoke with pt   .  POLYPECTOMY  10/08/2015   Procedure: POLYPECTOMY;  Surgeon: Danie Binder, MD;  Location: [facility];  Service: Endoscopy;;  cecal polyp removed via cold forceps with one clip placed/ rectal polyp removed via cold forcep  . SHOULDER SURGERY  2006   Left-post-trauma  . TOTAL ABDOMINAL HYSTERECTOMY W/ BILATERAL SALPINGOOPHORECTOMY      Family History  Problem Relation Age of Onset  . Hypertension Mother   . Arthritis Mother   . Stroke Father   . Hypertension Father   . Coronary artery disease Father   . Diabetes Sister   . Sarcoidosis Sister   . Diabetes Sister   . Heart failure Brother   . Congestive Heart Failure Brother   . Colon cancer Neg Hx     Social History   Socioeconomic History  . Marital status: Married    Spouse name: Not on file  . Number of children: 0  . Years of education: Not on file  . Highest education level: Not on file  Occupational History  . Occupation: Museum/gallery exhibitions officer: UNEMPLOYED  Tobacco Use  . Smoking status: Never Smoker  . Smokeless tobacco: Never Used  Vaping Use  . Vaping Use: Never used  Substance and Sexual Activity  . Alcohol use: No  . Drug use: No  . Sexual activity: Yes    Birth control/protection: Surgical  Other Topics Concern  . Not on file  Social History Narrative  .  Not on file   Social Determinants of Health   Financial Resource Strain: Low Risk   . Difficulty of Paying Living Expenses: Not hard at all  Food Insecurity: No Food Insecurity  . Worried About Charity fundraiser in the Last Year: Never true  . Ran Out of Food in the Last Year: Never true  Transportation Needs: No Transportation Needs  . Lack of Transportation (Medical): No  . Lack of Transportation (Non-Medical): No  Physical Activity: Sufficiently Active  . Days of Exercise per Week: 5 days  . Minutes of Exercise per Session: 30 min  Stress: No Stress Concern Present  . Feeling of Stress : Not at all  Social Connections: Moderately  Integrated  . Frequency of Communication with Friends and Family: More than three times a week  . Frequency of Social Gatherings with Friends and Family: More than three times a week  . Attends Religious Services: More than 4 times per year  . Active Member of Clubs or Organizations: No  . Attends Archivist Meetings: Never  . Marital Status: Married  Human resources officer Violence: Not At Risk  . Fear of Current or Ex-Partner: No  . Emotionally Abused: No  . Physically Abused: No  . Sexually Abused: No    Outpatient Medications Prior to Visit  Medication Sig Dispense Refill  . atorvastatin (LIPITOR) 80 MG tablet Take 1 tablet (80 mg total) by mouth daily. 90 tablet 3  . benazepril (LOTENSIN) 20 MG tablet TAKE 1 TABLET BY MOUTH EVERY DAY 90 tablet 1  . Cholecalciferol (VITAMIN D3 PO) Take 1,000 Units by mouth daily.    Marland Kitchen diltiazem (CARDIZEM CD) 120 MG 24 hr capsule Take 1 capsule (120 mg total) by mouth daily. 30 capsule 5  . furosemide (LASIX) 20 MG tablet TAKE 1 TABLET (20 MG TOTAL) BY MOUTH DAILY AS NEEDED FOR FLUID. 90 tablet 1  . metoprolol tartrate (LOPRESSOR) 100 MG tablet TAKE 1 TABLET BY MOUTH TWICE A DAY 180 tablet 0  . warfarin (COUMADIN) 5 MG tablet TAKE 1 TABLET DAILY EXCEPT 1/2 TABLET ON SUNDAY AND THURSDAY OR AS DIRECTED BY COUMADIN CLINIC 100 tablet 3   No facility-administered medications prior to visit.    No Known Allergies  ROS Review of Systems  Constitutional: Negative.   HENT: Negative.   Eyes: Negative.   Respiratory: Negative.   Cardiovascular: Negative.   Gastrointestinal: Negative.   Endocrine: Negative.   Genitourinary: Negative.   Musculoskeletal: Negative.   Skin: Negative.   Allergic/Immunologic: Negative.   Neurological: Negative.   Hematological: Negative.   Psychiatric/Behavioral: Negative.       Objective:    Physical Exam Constitutional:      Appearance: Normal appearance. She is obese.  HENT:     Head: Normocephalic and  atraumatic.     Right Ear: Tympanic membrane, ear canal and external ear normal.     Left Ear: Tympanic membrane, ear canal and external ear normal.     Nose: Nose normal.     Mouth/Throat:     Mouth: Mucous membranes are moist.     Pharynx: Oropharynx is clear.  Eyes:     Extraocular Movements: Extraocular movements intact.     Conjunctiva/sclera: Conjunctivae normal.     Pupils: Pupils are equal, round, and reactive to light.  Cardiovascular:     Rate and Rhythm: Normal rate. Rhythm irregular.     Pulses: Normal pulses.          Dorsalis pedis  pulses are 2+ on the right side and 2+ on the left side.     Heart sounds: Normal heart sounds.  Pulmonary:     Effort: Pulmonary effort is normal.     Breath sounds: Normal breath sounds.  Abdominal:     General: Abdomen is flat. Bowel sounds are normal.     Palpations: Abdomen is soft.     Comments: Midline surgical scar  Musculoskeletal:        General: Normal range of motion.     Cervical back: Normal range of motion and neck supple.  Feet:     Right foot:     Protective Sensation: 10 sites tested. 10 sites sensed.     Skin integrity: Skin integrity normal.     Toenail Condition: Right toenails are abnormally thick.     Left foot:     Protective Sensation: 10 sites tested. 10 sites sensed.     Skin integrity: Skin integrity normal.     Toenail Condition: Left toenails are abnormally thick.  Skin:    General: Skin is warm and dry.     Capillary Refill: Capillary refill takes less than 2 seconds.  Neurological:     General: No focal deficit present.     Mental Status: She is alert and oriented to person, place, and time. Mental status is at baseline.     Cranial Nerves: No cranial nerve deficit.     Sensory: No sensory deficit.     Motor: No weakness.     Coordination: Coordination normal.     Gait: Gait normal.  Psychiatric:        Mood and Affect: Mood normal.        Behavior: Behavior normal.        Thought Content:  Thought content normal.        Judgment: Judgment normal.     BP 121/85   Pulse 68   Temp 98.5 F (36.9 C)   Resp 20   Ht 5' 2"  (1.575 m)   Wt 207 lb (93.9 kg)   SpO2 98%   BMI 37.86 kg/m  Wt Readings from Last 3 Encounters:  07/24/20 207 lb (93.9 kg)  03/15/20 200 lb (90.7 kg)  03/15/20 200 lb (90.7 kg)     Health Maintenance Due  Topic Date Due  . FOOT EXAM  04/03/2020    There are no preventive care reminders to display for this patient.  Lab Results  Component Value Date   TSH 1.040 02/22/2020   Lab Results  Component Value Date   WBC 6.1 03/15/2020   HGB 13.5 03/15/2020   HCT 43.0 03/15/2020   MCV 89.8 03/15/2020   PLT 255 03/15/2020   Lab Results  Component Value Date   NA 134 (L) 03/15/2020   K 3.8 03/15/2020   CO2 20 (L) 03/15/2020   GLUCOSE 112 (H) 03/15/2020   BUN 34 (H) 03/15/2020   CREATININE 1.47 (H) 03/15/2020   BILITOT 0.3 03/15/2020   ALKPHOS 96 03/15/2020   AST 22 03/15/2020   ALT 20 03/15/2020   PROT 8.0 03/15/2020   ALBUMIN 3.7 03/15/2020   CALCIUM 9.2 03/15/2020   ANIONGAP 9 03/15/2020   Lab Results  Component Value Date   CHOL 166 02/22/2020   Lab Results  Component Value Date   HDL 36 (L) 02/22/2020   Lab Results  Component Value Date   LDLCALC 109 (H) 02/22/2020   Lab Results  Component Value Date   TRIG 112  02/22/2020   Lab Results  Component Value Date   CHOLHDL 4.6 (H) 02/22/2020   Lab Results  Component Value Date   HGBA1C 5.9 (A) 02/21/2020      Assessment & Plan:   Problem List Items Addressed This Visit      Cardiovascular and Mediastinum   Essential hypertension    -BP well controlled today -taking benazepril -taking metoprolol and cardizem -taking lasiz      Relevant Orders   CBC with Differential/Platelet   CMP14+EGFR   Lipid Panel With LDL/HDL Ratio   Paroxysmal atrial fibrillation ([facility])    -coumadin managed by cardiology -metoprolol and cardizem for rate control      Type 2  diabetes mellitus with vascular disease ([place])    -has been diet controlled -will check A1c with next set of labs      Relevant Orders   Hemoglobin A1c   Lipid Panel With LDL/HDL Ratio   Microalbumin / creatinine urine ratio     Endocrine   Thyroid goiter    -Hannah ordered U/S at a previous visit, no results to view -will check thyroid labs with next set of labs      Relevant Orders   TSH + free T4    Other Visit Diagnoses    Preventative health care    -  Primary   Relevant Orders   CBC with Differential/Platelet   CMP14+EGFR   Hemoglobin A1c   Lipid Panel With LDL/HDL Ratio   TSH + free T4   Microalbumin / creatinine urine ratio      No orders of the defined types were placed in this encounter.   Follow-up: Return in about 1 year (around 07/24/2021) for Physical Exam (with foot exam).    Noreene Larsson, NP

## 2020-07-24 NOTE — Assessment & Plan Note (Signed)
-  check fasting labs today

## 2020-07-24 NOTE — Assessment & Plan Note (Signed)
-  has been diet controlled -will check A1c with next set of labs

## 2020-07-24 NOTE — Assessment & Plan Note (Addendum)
-  BP well controlled today -taking benazepril -taking metoprolol and cardizem -taking lasiz

## 2020-07-25 ENCOUNTER — Other Ambulatory Visit: Payer: Self-pay | Admitting: Nurse Practitioner

## 2020-07-25 DIAGNOSIS — E1159 Type 2 diabetes mellitus with other circulatory complications: Secondary | ICD-10-CM

## 2020-07-25 MED ORDER — METFORMIN HCL ER 500 MG PO TB24
500.0000 mg | ORAL_TABLET | Freq: Every day | ORAL | 1 refills | Status: DC
Start: 2020-07-25 — End: 2021-01-24

## 2020-07-25 NOTE — Progress Notes (Signed)
Fasting labs to be drawn in 3 months.

## 2020-07-25 NOTE — Progress Notes (Signed)
Her A1c is elevated at 6.6 which is in the diabetic range.  Her blood, kidneys, and liver look great.  Her bad cholesterol, or LDL, is 100, but being diabetic we would like this lower than 70.  I'm calling in some metformin for her blood sugar, and we will get fasting labs in 3 months to check her A1c again.

## 2020-07-26 LAB — CMP14+EGFR
ALT: 23 IU/L (ref 0–32)
AST: 21 IU/L (ref 0–40)
Albumin/Globulin Ratio: 1.3 (ref 1.2–2.2)
Albumin: 4.2 g/dL (ref 3.7–4.7)
Alkaline Phosphatase: 141 IU/L — ABNORMAL HIGH (ref 44–121)
BUN/Creatinine Ratio: 15 (ref 12–28)
BUN: 12 mg/dL (ref 8–27)
Bilirubin Total: 0.4 mg/dL (ref 0.0–1.2)
CO2: 26 mmol/L (ref 20–29)
Calcium: 9 mg/dL (ref 8.7–10.3)
Chloride: 102 mmol/L (ref 96–106)
Creatinine, Ser: 0.81 mg/dL (ref 0.57–1.00)
GFR calc Af Amer: 85 mL/min/{1.73_m2} (ref >59)
GFR calc non Af Amer: 73 mL/min/{1.73_m2} (ref >59)
Globulin, Total: 3.2 g/dL (ref 1.5–4.5)
Glucose: 82 mg/dL (ref 65–99)
Potassium: 4.1 mmol/L (ref 3.5–5.2)
Sodium: 141 mmol/L (ref 134–144)
Total Protein: 7.4 g/dL (ref 6.0–8.5)

## 2020-07-26 LAB — MICROALBUMIN / CREATININE URINE RATIO
Creatinine, Urine: 45 mg/dL
Microalb/Creat Ratio: 7 mg/g creat (ref 0–29)
Microalbumin, Urine: 3.2 ug/mL

## 2020-07-26 LAB — CBC WITH DIFFERENTIAL/PLATELET
Basophils Absolute: 0.1 10*3/uL (ref 0.0–0.2)
Basos: 1 %
EOS (ABSOLUTE): 0.3 10*3/uL (ref 0.0–0.4)
Eos: 5 %
Hematocrit: 39.4 % (ref 34.0–46.6)
Hemoglobin: 13 g/dL (ref 11.1–15.9)
Immature Grans (Abs): 0 10*3/uL (ref 0.0–0.1)
Immature Granulocytes: 0 %
Lymphocytes Absolute: 2 10*3/uL (ref 0.7–3.1)
Lymphs: 36 %
MCH: 28.2 pg (ref 26.6–33.0)
MCHC: 33 g/dL (ref 31.5–35.7)
MCV: 86 fL (ref 79–97)
Monocytes Absolute: 0.5 10*3/uL (ref 0.1–0.9)
Monocytes: 10 %
Neutrophils Absolute: 2.6 10*3/uL (ref 1.4–7.0)
Neutrophils: 48 %
Platelets: 208 10*3/uL (ref 150–450)
RBC: 4.61 x10E6/uL (ref 3.77–5.28)
RDW: 14.2 % (ref 11.7–15.4)
WBC: 5.4 10*3/uL (ref 3.4–10.8)

## 2020-07-26 LAB — LIPID PANEL WITH LDL/HDL RATIO
Cholesterol, Total: 153 mg/dL (ref 100–199)
HDL: 38 mg/dL — ABNORMAL LOW (ref >39)
LDL Chol Calc (NIH): 100 mg/dL — ABNORMAL HIGH (ref 0–99)
LDL/HDL Ratio: 2.6 ratio (ref 0.0–3.2)
Triglycerides: 76 mg/dL (ref 0–149)
VLDL Cholesterol Cal: 15 mg/dL (ref 5–40)

## 2020-07-26 LAB — HEMOGLOBIN A1C
Est. average glucose Bld gHb Est-mCnc: 143 mg/dL
Hgb A1c MFr Bld: 6.6 % — ABNORMAL HIGH (ref 4.8–5.6)

## 2020-07-26 LAB — TSH+FREE T4
Free T4: 1.38 ng/dL (ref 0.82–1.77)
TSH: 1.77 u[IU]/mL (ref 0.450–4.500)

## 2020-07-31 ENCOUNTER — Encounter: Payer: Medicare Other | Admitting: Family Medicine

## 2020-08-15 ENCOUNTER — Other Ambulatory Visit: Payer: Self-pay

## 2020-08-15 ENCOUNTER — Telehealth: Payer: Self-pay

## 2020-08-15 MED ORDER — ATORVASTATIN CALCIUM 80 MG PO TABS
80.0000 mg | ORAL_TABLET | Freq: Every day | ORAL | 3 refills | Status: DC
Start: 2020-08-15 — End: 2021-07-22

## 2020-08-15 NOTE — Telephone Encounter (Signed)
Med refill Atorvastatin 80 mg  Pharmacy [facility]

## 2020-08-15 NOTE — Telephone Encounter (Signed)
Refilled

## 2020-08-16 ENCOUNTER — Other Ambulatory Visit: Payer: Self-pay | Admitting: Cardiology

## 2020-08-16 ENCOUNTER — Other Ambulatory Visit: Payer: Self-pay | Admitting: Family Medicine

## 2020-08-29 ENCOUNTER — Ambulatory Visit (INDEPENDENT_AMBULATORY_CARE_PROVIDER_SITE_OTHER): Payer: Medicare Other | Admitting: *Deleted

## 2020-08-29 DIAGNOSIS — I480 Paroxysmal atrial fibrillation: Secondary | ICD-10-CM

## 2020-08-29 DIAGNOSIS — Z5181 Encounter for therapeutic drug level monitoring: Secondary | ICD-10-CM

## 2020-08-29 LAB — POCT INR: INR: 1.9 — AB (ref 2.0–3.0)

## 2020-08-29 NOTE — Patient Instructions (Signed)
Take warfarin extra 1/2 tablet today then resume 1 tablet daily except 1/2 tablet on Sundays, Tuesdays and Thursdays Recheck in 6 weeks

## 2020-09-04 ENCOUNTER — Encounter: Payer: Self-pay | Admitting: Internal Medicine

## 2020-09-12 ENCOUNTER — Other Ambulatory Visit: Payer: Self-pay

## 2020-09-12 ENCOUNTER — Telehealth (INDEPENDENT_AMBULATORY_CARE_PROVIDER_SITE_OTHER): Payer: Medicare Other | Admitting: Nurse Practitioner

## 2020-09-12 ENCOUNTER — Encounter: Payer: Self-pay | Admitting: Nurse Practitioner

## 2020-09-12 ENCOUNTER — Ambulatory Visit (HOSPITAL_COMMUNITY)
Admission: RE | Admit: 2020-09-12 | Discharge: 2020-09-12 | Disposition: A | Discharge status: Home / Self Care | Payer: Medicare Other | Source: Ambulatory Visit | Attending: Nurse Practitioner | Admitting: Nurse Practitioner

## 2020-09-12 DIAGNOSIS — R109 Unspecified abdominal pain: Secondary | ICD-10-CM | POA: Diagnosis not present

## 2020-09-12 DIAGNOSIS — I10 Essential (primary) hypertension: Secondary | ICD-10-CM

## 2020-09-12 LAB — POCT URINALYSIS DIPSTICK
Glucose, UA: NEGATIVE
Leukocytes, UA: NEGATIVE
Nitrite, UA: NEGATIVE
Protein, UA: POSITIVE — AB
Spec Grav, UA: 1.02 (ref 1.010–1.025)
Urobilinogen, UA: 0.2 E.U./dL
pH, UA: 6 (ref 5.0–8.0)

## 2020-09-12 MED ORDER — TAMSULOSIN HCL 0.4 MG PO CAPS: 0.4000 mg | ORAL_CAPSULE | Freq: Every day | ORAL | 0 refills | Status: DC

## 2020-09-12 MED ORDER — TRAMADOL HCL 50 MG PO TABS: 50.0000 mg | ORAL_TABLET | Freq: Three times a day (TID) | ORAL | 0 refills | Status: AC | PRN

## 2020-09-12 NOTE — Patient Instructions (Signed)
If pain gets worse, don't hesitate to go to the emergency department for immediate imaging.   We sent in orders to get a renal ultrasound as soon as possible.  For pain, I sent in medicine as well as tamsulosin in case you have a kidney stone.

## 2020-09-12 NOTE — Progress Notes (Signed)
There was no hydronephrosis, so there is no stone blocking the urine from getting out of the kidney. There was a possible small right kidney stone, so hopefull the tamsulosin that I sent in will help. If the pain gest worse or doesn't get better by Monday, return to the clinic. If it gets really back, the ED may do immediate CT scans to determine the cause of the pain.

## 2020-09-12 NOTE — Addendum Note (Signed)
Addended by: Lonn Georgia on: 09/12/2020 04:35 PM   Modules accepted: Orders

## 2020-09-12 NOTE — Assessment & Plan Note (Signed)
BP Readings from Last 3 Encounters:  09/12/20 102/68  07/24/20 121/85  03/15/20 100/78   -bp well controlled today

## 2020-09-12 NOTE — Addendum Note (Signed)
Addended by: Demetrius Revel on: 09/12/2020 02:50 PM   Modules accepted: Orders

## 2020-09-12 NOTE — Progress Notes (Signed)
Acute Office Visit  Subjective:    Patient ID: Samantha Vega, female    DOB: [birth year], 70 y.o.   MRN: [id number]  Chief Complaint  Patient presents with  . Abdominal Pain    L side pain x 2 days, flank pain.     HPI Patient is in today for abdominal pain x 2 days. She rates her pain at 10/10. She states that her pain comes and goes, but hurts severely in her left flank when it does hurt.  Denies N/V/D and constipation.  Past Medical History:  Diagnosis Date  . Annual physical exam 06/07/2015  . Arthritis   . Baker's cyst of knee 10/04/2013  . Chest pain    Noncardiac; evaluated and treated in 2008  . Diabetes mellitus   . Dyspnea on exertion 10/14/2019  . Enlarged heart   . Hyperlipidemia   . Hypertension   . Metabolic syndrome X 3/61/2244  . OA (osteoarthritis) of knee 05/05/2012   Left, takes tramadol on avg twice per week  . Obesity   . PAF (paroxysmal atrial fibrillation) ([place])   . Right bundle branch block    +Palpitations.   EKG 11/2006:  NSR, RBBB, LAFB, markedly delayed R-wave progression, no change; Echocardiogram in 10/2006-suboptimal quality, normal; Event recorder-PVCs, no significant arrhythmias or symptoms ;  . TIA (transient ischemic attack) 06/02/2019  . Unspecified visual loss 12/08/2008    Past Surgical History:  Procedure Laterality Date  . ABDOMINAL HYSTERECTOMY    . BREAST BIOPSY Left    benign  . CATARACT EXTRACTION W/PHACO Right 01/08/2016   Procedure: CATARACT EXTRACTION PHACO AND INTRAOCULAR LENS PLACEMENT (IOC);  Surgeon: Rutherford Guys, MD;  Location: [facility];  Service: Ophthalmology;  Laterality: Right;  CDE: 5.78  . CATARACT EXTRACTION W/PHACO Left 02/05/2016   Procedure: CATARACT EXTRACTION PHACO AND INTRAOCULAR LENS PLACEMENT LEFT EYE CDE=6.50;  Surgeon: Rutherford Guys, MD;  Location: [facility];  Service: Ophthalmology;  Laterality: Left;  . COLONOSCOPY  2006   Dr. Tamala Julian: Normal, repeat in 2009.  Marland Kitchen COLONOSCOPY N/A 10/08/2015   Procedure:  COLONOSCOPY;  Surgeon: Danie Binder, MD;  [facility];  Service: Endoscopy;  Laterality: N/A;  230 - moved to 1:30, spoke with pt   . POLYPECTOMY  10/08/2015   Procedure: POLYPECTOMY;  Surgeon: Danie Binder, MD;  [facility];  Service: Endoscopy;;  cecal polyp removed via cold forceps with one clip placed/ rectal polyp removed via cold forcep  . SHOULDER SURGERY  2006   Left-post-trauma  . TOTAL ABDOMINAL HYSTERECTOMY W/ BILATERAL SALPINGOOPHORECTOMY      Family History  Problem Relation Age of Onset  . Hypertension Mother   . Arthritis Mother   . Stroke Father   . Hypertension Father   . Coronary artery disease Father   . Diabetes Sister   . Sarcoidosis Sister   . Diabetes Sister   . Heart failure Brother   . Congestive Heart Failure Brother   . Colon cancer Neg Hx     Social History   Socioeconomic History  . Marital status: Married    Spouse name: Not on file  . Number of children: 0  . Years of education: Not on file  . Highest education level: Not on file  Occupational History  . Occupation: Museum/gallery exhibitions officer: UNEMPLOYED  Tobacco Use  . Smoking status: Never Smoker  . Smokeless tobacco: Never Used  Vaping Use  . Vaping Use: Never used  Substance  and Sexual Activity  . Alcohol use: No  . Drug use: No  . Sexual activity: Yes    Birth control/protection: Surgical  Other Topics Concern  . Not on file  Social History Narrative  . Not on file   Social Determinants of Health   Financial Resource Strain: Low Risk   . Difficulty of Paying Living Expenses: Not hard at all  Food Insecurity: No Food Insecurity  . Worried About Charity fundraiser in the Last Year: Never true  . Ran Out of Food in the Last Year: Never true  Transportation Needs: No Transportation Needs  . Lack of Transportation (Medical): No  . Lack of Transportation (Non-Medical): No  Physical Activity: Sufficiently Active  . Days of Exercise per Week: 5  days  . Minutes of Exercise per Session: 30 min  Stress: No Stress Concern Present  . Feeling of Stress : Not at all  Social Connections: Moderately Integrated  . Frequency of Communication with Friends and Family: More than three times a week  . Frequency of Social Gatherings with Friends and Family: More than three times a week  . Attends Religious Services: More than 4 times per year  . Active Member of Clubs or Organizations: No  . Attends Archivist Meetings: Never  . Marital Status: Married  Human resources officer Violence: Not At Risk  . Fear of Current or Ex-Partner: No  . Emotionally Abused: No  . Physically Abused: No  . Sexually Abused: No    Outpatient Medications Prior to Visit  Medication Sig Dispense Refill  . atorvastatin (LIPITOR) 80 MG tablet Take 1 tablet (80 mg total) by mouth daily. 90 tablet 3  . benazepril (LOTENSIN) 20 MG tablet TAKE 1 TABLET BY MOUTH EVERY DAY 90 tablet 1  . Cholecalciferol (VITAMIN D3 PO) Take 1,000 Units by mouth daily.    Marland Kitchen diltiazem (CARDIZEM CD) 120 MG 24 hr capsule TAKE 1 CAPSULE BY MOUTH EVERY DAY 90 capsule 1  . furosemide (LASIX) 20 MG tablet TAKE 1 TABLET (20 MG TOTAL) BY MOUTH DAILY AS NEEDED FOR FLUID. 90 tablet 1  . metFORMIN (GLUCOPHAGE-XR) 500 MG 24 hr tablet Take 1 tablet (500 mg total) by mouth daily with breakfast. 90 tablet 1  . metoprolol tartrate (LOPRESSOR) 100 MG tablet TAKE 1 TABLET BY MOUTH TWICE A DAY 180 tablet 0  . warfarin (COUMADIN) 5 MG tablet TAKE 1 TABLET DAILY EXCEPT 1/2 TABLET ON SUNDAY AND THURSDAY OR AS DIRECTED BY COUMADIN CLINIC 100 tablet 3  . ketorolac (TORADOL) 10 MG tablet Take 10 mg by mouth every 8 (eight) hours as needed for pain.     No facility-administered medications prior to visit.    No Known Allergies  Review of Systems  Constitutional: Negative.   Respiratory: Negative.   Cardiovascular: Negative.   Genitourinary: Positive for flank pain. Negative for dysuria, frequency and  urgency.       Objective:    Physical Exam Constitutional:      Appearance: She is well-developed.  Cardiovascular:     Rate and Rhythm: Normal rate. Rhythm irregular.     Heart sounds: Normal heart sounds.  Abdominal:     General: Abdomen is flat. Bowel sounds are normal.     Palpations: Abdomen is soft.     Tenderness: There is no abdominal tenderness. There is no right CVA tenderness or left CVA tenderness.  Neurological:     Mental Status: She is alert.     BP 102/68  Pulse (!) 56   Temp 97.7 F (36.5 C)   Resp 20   Ht 5\' 1"  (1.549 m)   Wt 201 lb (91.2 kg)   SpO2 96%   BMI 37.98 kg/m  Wt Readings from Last 3 Encounters:  09/12/20 201 lb (91.2 kg)  07/24/20 207 lb (93.9 kg)  03/15/20 200 lb (90.7 kg)    Health Maintenance Due  Topic Date Due  . FOOT EXAM  04/03/2020    There are no preventive care reminders to display for this patient.   Lab Results  Component Value Date   TSH 1.770 07/24/2020   Lab Results  Component Value Date   WBC 5.4 07/24/2020   HGB 13.0 07/24/2020   HCT 39.4 07/24/2020   MCV 86 07/24/2020   PLT 208 07/24/2020   Lab Results  Component Value Date   NA 141 07/24/2020   K 4.1 07/24/2020   CO2 26 07/24/2020   GLUCOSE 82 07/24/2020   BUN 12 07/24/2020   CREATININE 0.81 07/24/2020   BILITOT 0.4 07/24/2020   ALKPHOS 141 (H) 07/24/2020   AST 21 07/24/2020   ALT 23 07/24/2020   PROT 7.4 07/24/2020   ALBUMIN 4.2 07/24/2020   CALCIUM 9.0 07/24/2020   ANIONGAP 9 03/15/2020   Lab Results  Component Value Date   CHOL 153 07/24/2020   Lab Results  Component Value Date   HDL 38 (L) 07/24/2020   Lab Results  Component Value Date   LDLCALC 100 (H) 07/24/2020   Lab Results  Component Value Date   TRIG 76 07/24/2020   Lab Results  Component Value Date   CHOLHDL 4.6 (H) 02/22/2020   Lab Results  Component Value Date   HGBA1C 6.6 (H) 07/24/2020       Assessment & Plan:   Problem List Items Addressed This Visit       Cardiovascular and Mediastinum   Essential hypertension    BP Readings from Last 3 Encounters:  09/12/20 102/68  07/24/20 121/85  03/15/20 100/78   -bp well controlled today        Other   Abdominal pain    -colicky left flank pain -U/A today was positive for blood -will get renal US today -Rx. tamsulosin  -Rx. Tramadol for pain; avoided NSAIDs d/t warfarin use      Relevant Orders   US Renal   Urine Culture       Meds ordered this encounter  Medications  . tamsulosin (FLOMAX) 0.4 MG CAPS capsule    Sig: Take 1 capsule (0.4 mg total) by mouth daily.    Dispense:  14 capsule    Refill:  0  . traMADol (ULTRAM) 50 MG tablet    Sig: Take 1 tablet (50 mg total) by mouth every 8 (eight) hours as needed for up to 5 days for moderate pain or severe pain.    Dispense:  15 tablet    Refill:  0     Noreene Larsson, NP

## 2020-09-12 NOTE — Assessment & Plan Note (Addendum)
-  colicky left flank pain -U/A today was positive for blood -will get renal US today -Rx. tamsulosin  -Rx. Tramadol for pain; avoided NSAIDs d/t warfarin use  Late Entry: Pt came in this AM and dropped off a sample; states that she passed a large kidney stone; it is a 3/4" amorphous soft green blob; will order stone analysis, but I'm not certain this is a true kidney stone

## 2020-09-13 ENCOUNTER — Telehealth: Payer: Self-pay

## 2020-09-13 DIAGNOSIS — R109 Unspecified abdominal pain: Secondary | ICD-10-CM | POA: Diagnosis not present

## 2020-09-13 NOTE — Telephone Encounter (Signed)
Pt husband is going to bring the stone by.

## 2020-09-13 NOTE — Telephone Encounter (Signed)
Patient spouse Gwyndolyn Saxon called said Tekeisha passed a big kidney stone and the medicine is making her dizzy.  He asked if nurse could give him a call back # [phone].

## 2020-09-13 NOTE — Telephone Encounter (Signed)
Pt husband informed

## 2020-09-13 NOTE — Telephone Encounter (Signed)
Left message

## 2020-09-13 NOTE — Progress Notes (Signed)
Pt informed

## 2020-09-13 NOTE — Addendum Note (Signed)
Addended by: Demetrius Revel on: 09/13/2020 10:01 AM   Modules accepted: Orders

## 2020-09-13 NOTE — Telephone Encounter (Signed)
If she is getting dizzy, stop the medication. Awesome. We will send the stone in to the lab for analysis.

## 2020-09-14 ENCOUNTER — Telehealth: Payer: Self-pay

## 2020-09-14 NOTE — Telephone Encounter (Signed)
Patients husband called and is requesting the results of the specimen that was dropped off yesterday ph# [phone]

## 2020-09-14 NOTE — Telephone Encounter (Signed)
Pt informed we are unsure of what the specimen was, that it did not look like a stone. She plans to get her labs done on Monday.

## 2020-09-15 LAB — URINE CULTURE

## 2020-09-17 DIAGNOSIS — R109 Unspecified abdominal pain: Secondary | ICD-10-CM | POA: Diagnosis not present

## 2020-09-17 NOTE — Progress Notes (Signed)
U/A was negative for UTI.

## 2020-09-18 LAB — CBC WITH DIFFERENTIAL/PLATELET
Basophils Absolute: 0 10*3/uL (ref 0.0–0.2)
Basos: 1 %
EOS (ABSOLUTE): 0.3 10*3/uL (ref 0.0–0.4)
Eos: 5 %
Hematocrit: 39.4 % (ref 34.0–46.6)
Hemoglobin: 13.1 g/dL (ref 11.1–15.9)
Immature Grans (Abs): 0 10*3/uL (ref 0.0–0.1)
Immature Granulocytes: 0 %
Lymphocytes Absolute: 2.2 10*3/uL (ref 0.7–3.1)
Lymphs: 38 %
MCH: 28.3 pg (ref 26.6–33.0)
MCHC: 33.2 g/dL (ref 31.5–35.7)
MCV: 85 fL (ref 79–97)
Monocytes Absolute: 0.5 10*3/uL (ref 0.1–0.9)
Monocytes: 9 %
Neutrophils Absolute: 2.7 10*3/uL (ref 1.4–7.0)
Neutrophils: 47 %
Platelets: 242 10*3/uL (ref 150–450)
RBC: 4.63 x10E6/uL (ref 3.77–5.28)
RDW: 14.1 % (ref 11.7–15.4)
WBC: 5.8 10*3/uL (ref 3.4–10.8)

## 2020-09-18 LAB — CMP14+EGFR
ALT: 27 IU/L (ref 0–32)
AST: 35 IU/L (ref 0–40)
Albumin/Globulin Ratio: 1.2 (ref 1.2–2.2)
Albumin: 3.9 g/dL (ref 3.7–4.7)
Alkaline Phosphatase: 110 IU/L (ref 44–121)
BUN/Creatinine Ratio: 15 (ref 12–28)
BUN: 14 mg/dL (ref 8–27)
Bilirubin Total: 0.3 mg/dL (ref 0.0–1.2)
CO2: 24 mmol/L (ref 20–29)
Calcium: 9.4 mg/dL (ref 8.7–10.3)
Chloride: 99 mmol/L (ref 96–106)
Creatinine, Ser: 0.96 mg/dL (ref 0.57–1.00)
Globulin, Total: 3.3 g/dL (ref 1.5–4.5)
Glucose: 99 mg/dL (ref 65–99)
Potassium: 3.7 mmol/L (ref 3.5–5.2)
Sodium: 139 mmol/L (ref 134–144)
Total Protein: 7.2 g/dL (ref 6.0–8.5)
eGFR: 63 mL/min/{1.73_m2} (ref >59)

## 2020-09-18 NOTE — Progress Notes (Signed)
The rest of her labs are great!

## 2020-09-20 LAB — CALCULI, WITH PHOTOGRAPH (CLINICAL LAB): Weight Calculi: <1 mg

## 2020-09-20 NOTE — Progress Notes (Signed)
The sample was insufficient. The lab said the stone she gave [place] was actually not a stone at all, it was organic material.

## 2020-10-01 ENCOUNTER — Other Ambulatory Visit: Payer: Self-pay | Admitting: Family Medicine

## 2020-10-17 ENCOUNTER — Ambulatory Visit (INDEPENDENT_AMBULATORY_CARE_PROVIDER_SITE_OTHER): Payer: Medicare Other | Admitting: *Deleted

## 2020-10-17 DIAGNOSIS — I480 Paroxysmal atrial fibrillation: Secondary | ICD-10-CM | POA: Diagnosis not present

## 2020-10-17 DIAGNOSIS — Z5181 Encounter for therapeutic drug level monitoring: Secondary | ICD-10-CM | POA: Diagnosis not present

## 2020-10-17 LAB — POCT INR: INR: 2.3 (ref 2.0–3.0)

## 2020-10-17 NOTE — Patient Instructions (Signed)
Continue warfarin 1 tablet daily except 1/2 tablet on Sundays, Tuesdays and Thursdays.  Recheck in 6 weeks 

## 2020-11-05 ENCOUNTER — Other Ambulatory Visit: Payer: Self-pay

## 2020-11-05 ENCOUNTER — Encounter: Payer: Self-pay | Admitting: Family Medicine

## 2020-11-05 ENCOUNTER — Ambulatory Visit (INDEPENDENT_AMBULATORY_CARE_PROVIDER_SITE_OTHER): Payer: Medicare Other | Admitting: Family Medicine

## 2020-11-05 VITALS — BP 122/78 | HR 92 | Temp 97.5°F | Ht 62.0 in | Wt 205.0 lb

## 2020-11-05 DIAGNOSIS — I4811 Longstanding persistent atrial fibrillation: Secondary | ICD-10-CM | POA: Diagnosis not present

## 2020-11-05 DIAGNOSIS — I10 Essential (primary) hypertension: Secondary | ICD-10-CM | POA: Diagnosis not present

## 2020-11-05 DIAGNOSIS — M7989 Other specified soft tissue disorders: Secondary | ICD-10-CM

## 2020-11-05 DIAGNOSIS — R079 Chest pain, unspecified: Secondary | ICD-10-CM

## 2020-11-05 DIAGNOSIS — I480 Paroxysmal atrial fibrillation: Secondary | ICD-10-CM

## 2020-11-05 DIAGNOSIS — Z1231 Encounter for screening mammogram for malignant neoplasm of breast: Secondary | ICD-10-CM

## 2020-11-05 DIAGNOSIS — E1159 Type 2 diabetes mellitus with other circulatory complications: Secondary | ICD-10-CM

## 2020-11-05 DIAGNOSIS — D126 Benign neoplasm of colon, unspecified: Secondary | ICD-10-CM

## 2020-11-05 MED ORDER — CEPHALEXIN 500 MG PO CAPS: 500.0000 mg | ORAL_CAPSULE | Freq: Three times a day (TID) | ORAL | 0 refills | Status: DC

## 2020-11-05 NOTE — Assessment & Plan Note (Signed)
To gI , no new symptoms

## 2020-11-05 NOTE — Assessment & Plan Note (Addendum)
Update EKG,a fib, no acute ischemia, RBB, reports new intermittent non specific chest pain, will redirect to Cardiology if sooner appointment indicated than in July

## 2020-11-05 NOTE — Assessment & Plan Note (Addendum)
2 week history.Localized and painful Needs short antibiotic course and re eval

## 2020-11-05 NOTE — Progress Notes (Signed)
Samantha Vega     MRN: [id number]      DOB: 1950/07/25   HPI Samantha Vega is here weith a 2 week h/o left upper arm pain , swelling and tenderness.No known trauma to arm.worse wirth direct pressure, no known relief Intermittent left chest pain, for 4 months, approx 2 times per week, duration less than 1 minute, no other symptoms associated or relieving factors Denies polyuria, polydipsia, blurred vision , or hypoglycemic episodes. Does not test blood sugar regularly  ROS Denies recent fever or chills. Denies sinus pressure, nasal congestion, ear pain or sore throat. Denies chest congestion, productive cough or wheezing. Denies PND, orthopnea, palpitations and leg swelling Denies abdominal pain, nausea, vomiting,diarrhea or constipation.   Denies dysuria, frequency, hesitancy or incontinence.  Denies headaches, seizures, numbness, or tingling. Denies depression, anxiety or insomnia. Denies skin break down or rash.   PE  BP 122/78 (BP Location: Right Arm, Patient Position: Sitting, Cuff Size: Normal)   Pulse 92   Temp (!) 97.5 F (36.4 C) (Temporal)   Ht 5\' 2"  (1.575 m)   Wt 205 lb (93 kg)   SpO2 97%   BMI 37.49 kg/m   Patient alert and oriented and in no cardiopulmonary distress.  HEENT: No facial asymmetry, EOMI,     Neck supple .  Chest: Clear to auscultation bilaterally.  CVS: S1, S2 no murmurs, no S3.Regular rate. EKG; A fib, rate controlled, no acute ischemia , RBB, no  Change from prior ABD: Soft non tender.   Ext: No edema  MS: Adequate ROM spine, shoulders, hips and knees.Tender swelling over left upper arm Skin: Intact, no ulcerations or rash noted.  Psych: Good eye contact, normal affect. Memory intact not anxious or depressed appearing.  CNS: CN 2-12 intact, power,  normal throughout.no focal deficits noted.   Assessment & Plan  Left arm swelling 2 week history.Localized and painful Needs short antibiotic course and re eval  Essential  hypertension Controlled, no change in medication DASH diet and commitment to daily physical activity for a minimum of 30 minutes discussed and encouraged, as a part of hypertension management. The importance of attaining a healthy weight is also discussed.  BP/Weight 11/05/2020 09/12/2020 07/24/2020 03/15/2020 03/15/2020 08/21/8586 5/0/2774  Systolic BP 128 786 767 209 470 962 836  Diastolic BP 78 68 85 78 75 75 70  Wt. (Lbs) 205 201 207 200 200 200.12 200  BMI 37.49 37.98 37.86 37.79 37.79 37.81 36.58       Chest pain Update EKG,a fib, no acute ischemia, RBB, reports new intermittent non specific chest pain, will redirect to Cardiology if sooner appointment indicated than in July  Tubular adenoma of colon To gI , no new symptoms  Paroxysmal atrial fibrillation (HCC) Rate controlled but persistent a fib  Type 2 diabetes mellitus with vascular disease ([place]) Samantha Vega is reminded of the importance of commitment to daily physical activity for 30 minutes or more, as able and the need to limit carbohydrate intake to 30 to 60 grams per meal to help with blood sugar control.   The need to take medication as prescribed, test blood sugar as directed, and to call between visits if there is a concern that blood sugar is uncontrolled is also discussed.   Samantha Vega is reminded of the importance of daily foot exam, annual eye examination, and good blood sugar, blood pressure and cholesterol control.  Diabetic Labs Latest Ref Rng & Units 09/17/2020 07/24/2020 03/15/2020 02/22/2020 02/21/2020  HbA1c 4.8 -  5.6 % - 6.6(H) - - 5.9(A)  Microalbumin Not Estab. ug/mL - - - - -  Micro/Creat Ratio 0 - 29 mg/g creat - 7 - - -  Chol 100 - 199 mg/dL - 153 - 166 -  HDL >39 mg/dL - 38(L) - 36(L) -  Calc LDL 0 - 99 mg/dL - 100(H) - 109(H) -  Triglycerides 0 - 149 mg/dL - 76 - 112 -  Creatinine 0.57 - 1.00 mg/dL 0.96 0.81 1.47(H) 0.87 -   BP/Weight 11/05/2020 09/12/2020 07/24/2020 03/15/2020 03/15/2020 02/21/2020 01/22/1693   Systolic BP 503 888 280 034 917 915 056  Diastolic BP 78 68 85 78 75 75 70  Wt. (Lbs) 205 201 207 200 200 200.12 200  BMI 37.49 37.98 37.86 37.79 37.79 37.81 36.58   Foot/eye exam completion dates Latest Ref Rng & Units 11/05/2020 01/02/2020  Eye Exam No Retinopathy - No Retinopathy  Foot Form Completion - Done -   Controlled, no change in medication Samantha Vega is reminded of the importance of commitment to daily physical activity for 30 minutes or more, as able and the need to limit carbohydrate intake to 30 to 60 grams per meal to help with blood sugar control.   The need to take medication as prescribed, test blood sugar as directed, and to call between visits if there is a concern that blood sugar is uncontrolled is also discussed.   Samantha Vega is reminded of the importance of daily foot exam, annual eye examination, and good blood sugar, blood pressure and cholesterol control.  Diabetic Labs Latest Ref Rng & Units 09/17/2020 07/24/2020 03/15/2020 02/22/2020 02/21/2020  HbA1c 4.8 - 5.6 % - 6.6(H) - - 5.9(A)  Microalbumin Not Estab. ug/mL - - - - -  Micro/Creat Ratio 0 - 29 mg/g creat - 7 - - -  Chol 100 - 199 mg/dL - 153 - 166 -  HDL >39 mg/dL - 38(L) - 36(L) -  Calc LDL 0 - 99 mg/dL - 100(H) - 109(H) -  Triglycerides 0 - 149 mg/dL - 76 - 112 -  Creatinine 0.57 - 1.00 mg/dL 0.96 0.81 1.47(H) 0.87 -   BP/Weight 11/05/2020 09/12/2020 07/24/2020 03/15/2020 03/15/2020 02/21/9479 06/22/5535  Systolic BP 482 707 867 544 920 100 712  Diastolic BP 78 68 85 78 75 75 70  Wt. (Lbs) 205 201 207 200 200 200.12 200  BMI 37.49 37.98 37.86 37.79 37.79 37.81 36.58   Foot/eye exam completion dates Latest Ref Rng & Units 11/05/2020 01/02/2020  Eye Exam No Retinopathy - No Retinopathy  Foot Form Completion - Done -

## 2020-11-05 NOTE — Patient Instructions (Addendum)
F/U in 4 to 6 weeks , call if you need me sooner  Keflex prescribed for left arm tenderness and swelling  You are referred to Podiatry and to Cardiology   please schedule mammogram at checkout  It is important that you exercise regularly at least 30 minutes 5 times a week. If you develop chest pain, have severe difficulty breathing, or feel very tired, stop exercising immediately and seek medical attention  Thanks for choosing [facility], we consider it a privelige to serve you.

## 2020-11-05 NOTE — Assessment & Plan Note (Signed)
Controlled, no change in medication DASH diet and commitment to daily physical activity for a minimum of 30 minutes discussed and encouraged, as a part of hypertension management. The importance of attaining a healthy weight is also discussed.  BP/Weight 11/05/2020 09/12/2020 07/24/2020 03/15/2020 03/15/2020 07/23/621 12/19/2829  Systolic BP 517 616 073 710 626 948 546  Diastolic BP 78 68 85 78 75 75 70  Wt. (Lbs) 205 201 207 200 200 200.12 200  BMI 37.49 37.98 37.86 37.79 37.79 37.81 36.58

## 2020-11-06 ENCOUNTER — Encounter: Payer: Self-pay | Admitting: Family Medicine

## 2020-11-06 NOTE — Assessment & Plan Note (Signed)
Samantha Vega is reminded of the importance of commitment to daily physical activity for 30 minutes or more, as able and the need to limit carbohydrate intake to 30 to 60 grams per meal to help with blood sugar control.   The need to take medication as prescribed, test blood sugar as directed, and to call between visits if there is a concern that blood sugar is uncontrolled is also discussed.   Samantha Vega is reminded of the importance of daily foot exam, annual eye examination, and good blood sugar, blood pressure and cholesterol control.  Diabetic Labs Latest Ref Rng & Units 09/17/2020 07/24/2020 03/15/2020 02/22/2020 02/21/2020  HbA1c 4.8 - 5.6 % - 6.6(H) - - 5.9(A)  Microalbumin Not Estab. ug/mL - - - - -  Micro/Creat Ratio 0 - 29 mg/g creat - 7 - - -  Chol 100 - 199 mg/dL - 153 - 166 -  HDL >39 mg/dL - 38(L) - 36(L) -  Calc LDL 0 - 99 mg/dL - 100(H) - 109(H) -  Triglycerides 0 - 149 mg/dL - 76 - 112 -  Creatinine 0.57 - 1.00 mg/dL 0.96 0.81 1.47(H) 0.87 -   BP/Weight 11/05/2020 09/12/2020 07/24/2020 03/15/2020 03/15/2020 07/20/5571 07/18/252  Systolic BP 270 623 762 831 517 616 073  Diastolic BP 78 68 85 78 75 75 70  Wt. (Lbs) 205 201 207 200 200 200.12 200  BMI 37.49 37.98 37.86 37.79 37.79 37.81 36.58   Foot/eye exam completion dates Latest Ref Rng & Units 11/05/2020 01/02/2020  Eye Exam No Retinopathy - No Retinopathy  Foot Form Completion - Done -   Controlled, no change in medication Ms. Adney is reminded of the importance of commitment to daily physical activity for 30 minutes or more, as able and the need to limit carbohydrate intake to 30 to 60 grams per meal to help with blood sugar control.   The need to take medication as prescribed, test blood sugar as directed, and to call between visits if there is a concern that blood sugar is uncontrolled is also discussed.   Samantha Vega is reminded of the importance of daily foot exam, annual eye examination, and good blood sugar, blood pressure and  cholesterol control.  Diabetic Labs Latest Ref Rng & Units 09/17/2020 07/24/2020 03/15/2020 02/22/2020 02/21/2020  HbA1c 4.8 - 5.6 % - 6.6(H) - - 5.9(A)  Microalbumin Not Estab. ug/mL - - - - -  Micro/Creat Ratio 0 - 29 mg/g creat - 7 - - -  Chol 100 - 199 mg/dL - 153 - 166 -  HDL >39 mg/dL - 38(L) - 36(L) -  Calc LDL 0 - 99 mg/dL - 100(H) - 109(H) -  Triglycerides 0 - 149 mg/dL - 76 - 112 -  Creatinine 0.57 - 1.00 mg/dL 0.96 0.81 1.47(H) 0.87 -   BP/Weight 11/05/2020 09/12/2020 07/24/2020 03/15/2020 03/15/2020 12/14/624 02/17/8545  Systolic BP 270 350 093 818 299 371 696  Diastolic BP 78 68 85 78 75 75 70  Wt. (Lbs) 205 201 207 200 200 200.12 200  BMI 37.49 37.98 37.86 37.79 37.79 37.81 36.58   Foot/eye exam completion dates Latest Ref Rng & Units 11/05/2020 01/02/2020  Eye Exam No Retinopathy - No Retinopathy  Foot Form Completion - Done -

## 2020-11-06 NOTE — Assessment & Plan Note (Signed)
Rate controlled but persistent a fib

## 2020-11-11 ENCOUNTER — Other Ambulatory Visit: Payer: Self-pay | Admitting: Family Medicine

## 2020-11-13 ENCOUNTER — Telehealth: Payer: Self-pay

## 2020-11-13 ENCOUNTER — Other Ambulatory Visit: Payer: Self-pay | Admitting: Family Medicine

## 2020-11-13 ENCOUNTER — Telehealth: Payer: Self-pay | Admitting: Internal Medicine

## 2020-11-13 NOTE — Telephone Encounter (Signed)
No answer, voicemail did not pick up.

## 2020-11-13 NOTE — Telephone Encounter (Signed)
She needs an office visit because she is on coumadin.

## 2020-11-13 NOTE — Telephone Encounter (Signed)
Pt is calling she stated that her Left Arm & Right knee is hurting. She wanted a refill of Keflex, I told her that was a antiobiotic. I asked if anything else was going on she said no.  Please call the pt

## 2020-11-13 NOTE — Telephone Encounter (Signed)
OV made and appt card mailed °

## 2020-11-13 NOTE — Telephone Encounter (Signed)
Does patient need NV or OV?

## 2020-11-14 NOTE — Telephone Encounter (Signed)
No answer, voicemail did not pick up.

## 2020-11-15 ENCOUNTER — Other Ambulatory Visit: Payer: Self-pay

## 2020-11-15 ENCOUNTER — Ambulatory Visit (INDEPENDENT_AMBULATORY_CARE_PROVIDER_SITE_OTHER): Payer: Medicare Other | Admitting: Family Medicine

## 2020-11-15 ENCOUNTER — Encounter: Payer: Self-pay | Admitting: Family Medicine

## 2020-11-15 VITALS — BP 137/82 | HR 98 | Resp 16 | Ht 62.0 in | Wt 203.0 lb

## 2020-11-15 DIAGNOSIS — E1159 Type 2 diabetes mellitus with other circulatory complications: Secondary | ICD-10-CM | POA: Diagnosis not present

## 2020-11-15 DIAGNOSIS — M25561 Pain in right knee: Secondary | ICD-10-CM | POA: Insufficient documentation

## 2020-11-15 DIAGNOSIS — G8929 Other chronic pain: Secondary | ICD-10-CM | POA: Diagnosis not present

## 2020-11-15 DIAGNOSIS — I10 Essential (primary) hypertension: Secondary | ICD-10-CM

## 2020-11-15 NOTE — Telephone Encounter (Signed)
Pt has an appt 11-15-20

## 2020-11-15 NOTE — Patient Instructions (Signed)
Please keep July appointment and get fasting labs already ordered 3 to 5 days befiore the visit  You are referred to Orthopedics re your right knee  Please be careful not to fall  Thanks for choosing [facility], we consider it a privelige to serve you.

## 2020-11-20 ENCOUNTER — Encounter: Payer: Self-pay | Admitting: Family Medicine

## 2020-11-20 NOTE — Assessment & Plan Note (Signed)
Controlled, no change in medication  

## 2020-11-20 NOTE — Assessment & Plan Note (Signed)
  Patient re-educated about  the importance of commitment to a  minimum of 150 minutes of exercise per week as able.  The importance of healthy food choices with portion control discussed, as well as eating regularly and within a 12 hour window most days. The need to choose "clean , green" food 50 to 75% of the time is discussed, as well as to make water the primary drink and set a goal of 64 ounces water daily.    Weight /BMI 11/15/2020 11/05/2020 09/12/2020  WEIGHT 203 lb 205 lb 201 lb  HEIGHT 5\' 2"  5\' 2"  5\' 1"   BMI 37.13 kg/m2 37.49 kg/m2 37.98 kg/m2

## 2020-11-20 NOTE — Assessment & Plan Note (Signed)
Increased debility, pain and swelling refer Ortho

## 2020-11-20 NOTE — Assessment & Plan Note (Signed)
Samantha Vega is reminded of the importance of commitment to daily physical activity for 30 minutes or more, as able and the need to limit carbohydrate intake to 30 to 60 grams per meal to help with blood sugar control.   The need to take medication as prescribed, test blood sugar as directed, and to call between visits if there is a concern that blood sugar is uncontrolled is also discussed.   Samantha Vega is reminded of the importance of daily foot exam, annual eye examination, and good blood sugar, blood pressure and cholesterol control.  Diabetic Labs Latest Ref Rng & Units 09/17/2020 07/24/2020 03/15/2020 02/22/2020 02/21/2020  HbA1c 4.8 - 5.6 % - 6.6(H) - - 5.9(A)  Microalbumin Not Estab. ug/mL - - - - -  Micro/Creat Ratio 0 - 29 mg/g creat - 7 - - -  Chol 100 - 199 mg/dL - 153 - 166 -  HDL >39 mg/dL - 38(L) - 36(L) -  Calc LDL 0 - 99 mg/dL - 100(H) - 109(H) -  Triglycerides 0 - 149 mg/dL - 76 - 112 -  Creatinine 0.57 - 1.00 mg/dL 0.96 0.81 1.47(H) 0.87 -   BP/Weight 11/15/2020 11/05/2020 09/12/2020 07/24/2020 03/15/2020 7/59/1638 09/19/6597  Systolic BP 357 017 793 903 009 233 007  Diastolic BP 82 78 68 85 78 75 75  Wt. (Lbs) 203 205 201 207 200 200 200.12  BMI 37.13 37.49 37.98 37.86 37.79 37.79 37.81   Foot/eye exam completion dates Latest Ref Rng & Units 11/05/2020 01/02/2020  Eye Exam No Retinopathy - No Retinopathy  Foot Form Completion - Done -   Updated lab needed at/ before next visit.

## 2020-11-20 NOTE — Progress Notes (Signed)
Samantha Vega     MRN: [id number]      DOB: 11/07/1950   HPI Samantha Vega is here   With main c/o right knee pain and swelling , limiting mobility and increasing fall risk She has not fallen and is not using a cane. No inciting trauma Want to review meds and has questions as to whether she needs all she is taking  Denies polyuria, polydipsia, blurred vision , or hypoglycemic episodes.   ROS Denies recent fever or chills. Denies sinus pressure, nasal congestion, ear pain or sore throat. Denies chest congestion, productive cough or wheezing. Denies chest pains, palpitations and leg swelling Denies abdominal pain, nausea, vomiting,diarrhea or constipation.   Denies dysuria, frequency, hesitancy or incontinence.  Denies headaches, seizures, numbness, or tingling. Denies depression, anxiety or insomnia. Denies skin break down or rash.   PE  BP 137/82   Pulse 98   Resp 16   Ht 5\' 2"  (1.575 m)   Wt 203 lb (92.1 kg)   SpO2 92%   BMI 37.13 kg/m   Patient alert and oriented and in no cardiopulmonary distress.  HEENT: No facial asymmetry, EOMI,     Neck supple .  Chest: Clear to auscultation bilaterally.  CVS: S1, S2 no murmurs, no S3.Regular rate.  ABD: Soft non tender.   Ext: No edema  MS: Adequate ROM spine, shoulders, hips and  Markedly reduced in right knee which is swollen and tender.  Skin: Intact, no ulcerations or rash noted.  Psych: Good eye contact, normal affect. Memory intact not anxious or depressed appearing.  CNS: CN 2-12 intact, power,  normal throughout.no focal deficits noted.   Assessment & Plan  Knee pain, right Increased debility, pain and swelling refer Ortho  Essential hypertension Controlled, no change in medication   Morbid obesity ([place])  Patient re-educated about  the importance of commitment to a  minimum of 150 minutes of exercise per week as able.  The importance of healthy food choices with portion control discussed, as well  as eating regularly and within a 12 hour window most days. The need to choose "clean , green" food 50 to 75% of the time is discussed, as well as to make water the primary drink and set a goal of 64 ounces water daily.    Weight /BMI 11/15/2020 11/05/2020 09/12/2020  WEIGHT 203 lb 205 lb 201 lb  HEIGHT 5\' 2"  5\' 2"  5\' 1"   BMI 37.13 kg/m2 37.49 kg/m2 37.98 kg/m2      Type 2 diabetes mellitus with vascular disease ([place]) Samantha Vega is reminded of the importance of commitment to daily physical activity for 30 minutes or more, as able and the need to limit carbohydrate intake to 30 to 60 grams per meal to help with blood sugar control.   The need to take medication as prescribed, test blood sugar as directed, and to call between visits if there is a concern that blood sugar is uncontrolled is also discussed.   Samantha Vega is reminded of the importance of daily foot exam, annual eye examination, and good blood sugar, blood pressure and cholesterol control.  Diabetic Labs Latest Ref Rng & Units 09/17/2020 07/24/2020 03/15/2020 02/22/2020 02/21/2020  HbA1c 4.8 - 5.6 % - 6.6(H) - - 5.9(A)  Microalbumin Not Estab. ug/mL - - - - -  Micro/Creat Ratio 0 - 29 mg/g creat - 7 - - -  Chol 100 - 199 mg/dL - 153 - 166 -  HDL >39 mg/dL - 38(L) -  36(L) -  Calc LDL 0 - 99 mg/dL - 100(H) - 109(H) -  Triglycerides 0 - 149 mg/dL - 76 - 112 -  Creatinine 0.57 - 1.00 mg/dL 0.96 0.81 1.47(H) 0.87 -   BP/Weight 11/15/2020 11/05/2020 09/12/2020 07/24/2020 03/15/2020 6/96/2952 01/18/1323  Systolic BP 401 027 253 664 403 474 259  Diastolic BP 82 78 68 85 78 75 75  Wt. (Lbs) 203 205 201 207 200 200 200.12  BMI 37.13 37.49 37.98 37.86 37.79 37.79 37.81   Foot/eye exam completion dates Latest Ref Rng & Units 11/05/2020 01/02/2020  Eye Exam No Retinopathy - No Retinopathy  Foot Form Completion - Done -   Updated lab needed at/ before next visit.

## 2020-11-26 ENCOUNTER — Ambulatory Visit: Payer: Medicare Other

## 2020-11-26 ENCOUNTER — Encounter: Payer: Self-pay | Admitting: Orthopedic Surgery

## 2020-11-26 ENCOUNTER — Other Ambulatory Visit: Payer: Self-pay

## 2020-11-26 ENCOUNTER — Ambulatory Visit (INDEPENDENT_AMBULATORY_CARE_PROVIDER_SITE_OTHER): Payer: Medicare Other | Admitting: Orthopedic Surgery

## 2020-11-26 VITALS — BP 123/90 | HR 85 | Ht 64.0 in | Wt 205.2 lb

## 2020-11-26 DIAGNOSIS — M25561 Pain in right knee: Secondary | ICD-10-CM

## 2020-11-26 DIAGNOSIS — G8929 Other chronic pain: Secondary | ICD-10-CM | POA: Diagnosis not present

## 2020-11-26 NOTE — Progress Notes (Signed)
New patient visit  Chief Complaint  Patient presents with   Arm Pain    Left arm hurt x 2 months no known injury   Knee Pain    Right knee x 2 mths no know injury    History: 70 year old female presents for right knee pain evaluation and left arm pain evaluation  Right knee asymptomatic up until 2 months ago denies any injury complains of lateral knee pain and giving way  Patient is on warfarin for heart disease also has diabetes and history of thyroid goiter  She complains of left arm pain for roughly the same interval of time with lateral swelling and painful abduction again no trauma  The patient has not taken any over-the-counter medications again is on warfarin and cannot take anti-inflammatories  Physical Exam Constitutional:      General: She is not in acute distress.    Appearance: She is well-developed.     Comments: Well developed, well nourished Normal grooming and hygiene     Cardiovascular:     Comments: No peripheral edema Musculoskeletal:     Comments: Right knee is tender on the lateral side somewhat extra-articular with some pain at the lateral joint line she has mild tenderness medially full extension full flexion negative McMurray's  Left arm versus right arm shows some swelling at the deltoid painful resistance to abduction painful resistance to flexion slight decrease in range of motion positive impingement sign  Skin:    General: Skin is warm and dry.  Neurological:     Mental Status: She is alert and oriented to person, place, and time.     Sensory: No sensory deficit.     Motor: No weakness.     Coordination: Coordination normal.     Gait: Gait abnormal.     Deep Tendon Reflexes: Reflexes are normal and symmetric.  Psychiatric:        Mood and Affect: Mood normal.        Behavior: Behavior normal.        Thought Content: Thought content normal.        Judgment: Judgment normal.     Comments: Affect normal      X-rays of the right knee show  no arthritic changes  Recommend liniment over the left shoulder x-ray in 4 weeks  We gave her an intra-articular injection in the right knee and again follow-up in 4 weeks  Procedure injection right knee Medication Depo-Medrol 40 lidocaine 1% 2 cc Skin prepped with alcohol and ethyl chloride Verbal consent given Timeout confirmed right knee injection Injection given no complications

## 2020-11-26 NOTE — Patient Instructions (Addendum)
Apply ben gay 3 x a day left shoulder   You have received an injection of steroids into the joint. 15% of patients will have increased pain within the 24 hours postinjection.   This is transient and will go away.   We recommend that you use ice packs on the injection site for 20 minutes every 2 hours and extra strength Tylenol 2 tablets every 8 as needed until the pain resolves.  If you continue to have pain after taking the Tylenol and using the ice please call the office for further instructions.  Use Aspercreme, Biofreeze or Voltaren gel over the counter on shoulder 3 times daily make sure you rub it in well each time you use it.

## 2020-11-28 ENCOUNTER — Ambulatory Visit (INDEPENDENT_AMBULATORY_CARE_PROVIDER_SITE_OTHER): Payer: Medicare Other | Admitting: *Deleted

## 2020-11-28 DIAGNOSIS — I480 Paroxysmal atrial fibrillation: Secondary | ICD-10-CM | POA: Diagnosis not present

## 2020-11-28 DIAGNOSIS — Z5181 Encounter for therapeutic drug level monitoring: Secondary | ICD-10-CM

## 2020-11-28 LAB — POCT INR: INR: 1.8 — AB (ref 2.0–3.0)

## 2020-11-28 NOTE — Patient Instructions (Signed)
Take warfarin 1 1/2 tablets tonight then resume 1 tablet daily except 1/2 tablet on Sundays, Tuesdays and Thursdays Recheck in 6 weeks

## 2020-12-03 ENCOUNTER — Ambulatory Visit (HOSPITAL_COMMUNITY)
Admission: RE | Admit: 2020-12-03 | Discharge: 2020-12-03 | Disposition: A | Discharge status: Home / Self Care | Payer: Medicare Other | Source: Ambulatory Visit | Attending: Family Medicine | Admitting: Family Medicine

## 2020-12-03 ENCOUNTER — Other Ambulatory Visit: Payer: Self-pay

## 2020-12-03 DIAGNOSIS — Z1231 Encounter for screening mammogram for malignant neoplasm of breast: Secondary | ICD-10-CM | POA: Diagnosis not present

## 2020-12-10 ENCOUNTER — Other Ambulatory Visit: Payer: Self-pay | Admitting: Family Medicine

## 2020-12-16 ENCOUNTER — Other Ambulatory Visit: Payer: Self-pay | Admitting: Family Medicine

## 2020-12-18 ENCOUNTER — Ambulatory Visit: Payer: Medicare Other | Admitting: Family Medicine

## 2020-12-24 ENCOUNTER — Ambulatory Visit (INDEPENDENT_AMBULATORY_CARE_PROVIDER_SITE_OTHER): Payer: Medicare Other | Admitting: Orthopedic Surgery

## 2020-12-24 ENCOUNTER — Ambulatory Visit: Payer: Medicare Other

## 2020-12-24 ENCOUNTER — Other Ambulatory Visit: Payer: Self-pay

## 2020-12-24 ENCOUNTER — Encounter: Payer: Self-pay | Admitting: Orthopedic Surgery

## 2020-12-24 VITALS — BP 125/71 | HR 85 | Ht 62.0 in | Wt 200.2 lb

## 2020-12-24 DIAGNOSIS — M25512 Pain in left shoulder: Secondary | ICD-10-CM

## 2020-12-24 DIAGNOSIS — M25561 Pain in right knee: Secondary | ICD-10-CM

## 2020-12-24 DIAGNOSIS — G8929 Other chronic pain: Secondary | ICD-10-CM

## 2020-12-24 NOTE — Progress Notes (Signed)
FOLLOW UP   Encounter Diagnoses  Name Primary?   Chronic left shoulder pain Yes   Chronic pain of right knee      Chief Complaint  Patient presents with   Shoulder Pain    Left shoulder/follow up/pt says shoulder is feeling fine     Samantha Vega comes in for follow-up regarding her left shoulder and she had an x-ray she does have some mild arthritis but says other than some stiffness she is doing very well with her shoulder  In terms of her right knee she says that is also doing fine  Examination reveals abduction of the shoulder 90 degrees internal/external rotation normal no tenderness  She can flex and extend the right knee without pain  Recommend follow-up as needed

## 2020-12-27 ENCOUNTER — Other Ambulatory Visit: Payer: Self-pay

## 2020-12-27 ENCOUNTER — Encounter: Payer: Self-pay | Admitting: Internal Medicine

## 2020-12-27 ENCOUNTER — Ambulatory Visit (INDEPENDENT_AMBULATORY_CARE_PROVIDER_SITE_OTHER): Payer: Medicare Other | Admitting: Internal Medicine

## 2020-12-27 VITALS — BP 107/69 | HR 99 | Temp 96.8°F | Ht 64.0 in | Wt 200.4 lb

## 2020-12-27 DIAGNOSIS — D126 Benign neoplasm of colon, unspecified: Secondary | ICD-10-CM | POA: Diagnosis not present

## 2020-12-27 DIAGNOSIS — Z79899 Other long term (current) drug therapy: Secondary | ICD-10-CM

## 2020-12-27 NOTE — Progress Notes (Signed)
Primary Care Physician:  Fayrene Helper, MD Primary Gastroenterologist:  Dr. Abbey Chatters  Chief Complaint  Patient presents with   Colonoscopy    Due for tcs    HPI:   Samantha Vega is a 70 y.o. female who presents to the clinic today by referral from her PCP Dr. Moshe Cipro to discuss surveillance colonoscopy.  Last colonoscopy 2017 with multiple polyps removed, 1 of which was a 7 mm tubular adenoma in the cecum.  Patient denies any family history of colorectal malignancy.  No melena hematochezia.  No abdominal pain.  No change in bowel habits.  No unintentional weight loss.  Does chronically take Coumadin for atrial fibrillation.  She denies any history of a stroke but does have TIA listed on her past medical history from 2020.  No upper GI symptoms including heartburn, reflux, dysphagia, odynophagia, chest pain, epigastric pain.  Past Medical History:  Diagnosis Date   Annual physical exam 06/07/2015   Arthritis    Baker's cyst of knee 10/04/2013   Chest pain    Noncardiac; evaluated and treated in 2008   Diabetes mellitus    Dyspnea on exertion 10/14/2019   Enlarged heart    Hyperlipidemia    Hypertension    Metabolic syndrome X 4/74/2595   OA (osteoarthritis) of knee 05/05/2012   Left, takes tramadol on avg twice per week   Obesity    PAF (paroxysmal atrial fibrillation) (HCC)    Right bundle branch block    +Palpitations.   EKG 11/2006:  NSR, RBBB, LAFB, markedly delayed R-wave progression, no change; Echocardiogram in 10/2006-suboptimal quality, normal; Event recorder-PVCs, no significant arrhythmias or symptoms ;   TIA (transient ischemic attack) 06/02/2019   Unspecified visual loss 12/08/2008    Past Surgical History:  Procedure Laterality Date   ABDOMINAL HYSTERECTOMY     BREAST BIOPSY Left    benign   CATARACT EXTRACTION W/PHACO Right 01/08/2016   Procedure: CATARACT EXTRACTION PHACO AND INTRAOCULAR LENS PLACEMENT ([place]);  Surgeon: Rutherford Guys, MD;  Location: [facility];  Service: Ophthalmology;  Laterality: Right;  CDE: 5.78   CATARACT EXTRACTION W/PHACO Left 02/05/2016   Procedure: CATARACT EXTRACTION PHACO AND INTRAOCULAR LENS PLACEMENT LEFT EYE CDE=6.50;  Surgeon: Rutherford Guys, MD;  Location: [facility];  Service: Ophthalmology;  Laterality: Left;   COLONOSCOPY  2006   Dr. Tamala Julian: Normal, repeat in 2009.   COLONOSCOPY N/A 10/08/2015   Procedure: COLONOSCOPY;  Surgeon: Danie Binder, MD;  Location: [facility];  Service: Endoscopy;  Laterality: N/A;  230 - moved to 1:30, spoke with pt    POLYPECTOMY  10/08/2015   Procedure: POLYPECTOMY;  Surgeon: Danie Binder, MD;  Location: [facility];  Service: Endoscopy;;  cecal polyp removed via cold forceps with one clip placed/ rectal polyp removed via cold forcep   SHOULDER SURGERY  2006   Left-post-trauma   TOTAL ABDOMINAL HYSTERECTOMY W/ BILATERAL SALPINGOOPHORECTOMY      Current Outpatient Medications  Medication Sig Dispense Refill   atorvastatin (LIPITOR) 80 MG tablet Take 1 tablet (80 mg total) by mouth daily. 90 tablet 3   benazepril (LOTENSIN) 20 MG tablet TAKE 1 TABLET BY MOUTH EVERY DAY 90 tablet 1   diltiazem (CARDIZEM CD) 120 MG 24 hr capsule TAKE 1 CAPSULE BY MOUTH EVERY DAY 90 capsule 1   furosemide (LASIX) 20 MG tablet TAKE 1 TABLET (20 MG TOTAL) BY MOUTH DAILY AS NEEDED FOR FLUID. 90 tablet 1   metFORMIN (GLUCOPHAGE-XR) 500 MG 24  hr tablet Take 1 tablet (500 mg total) by mouth daily with breakfast. 90 tablet 1   metoprolol tartrate (LOPRESSOR) 100 MG tablet TAKE 1 TABLET BY MOUTH TWICE A DAY 180 tablet 0   warfarin (COUMADIN) 5 MG tablet TAKE 1 TABLET DAILY EXCEPT 1/2 TABLET ON SUNDAY AND THURSDAY OR AS DIRECTED BY COUMADIN CLINIC 100 tablet 3   No current facility-administered medications for this visit.    Allergies as of 12/27/2020   (No Known Allergies)    Family History  Problem Relation Age of Onset   Hypertension Mother    Arthritis Mother    Stroke Father    Hypertension  Father    Coronary artery disease Father    Diabetes Sister    Sarcoidosis Sister    Diabetes Sister    Heart failure Brother    Congestive Heart Failure Brother    Colon cancer Neg Hx     Social History   Socioeconomic History   Marital status: Married    Spouse name: Not on file   Number of children: 0   Years of education: Not on file   Highest education level: Not on file  Occupational History   Occupation: Museum/gallery exhibitions officer: UNEMPLOYED  Tobacco Use   Smoking status: Never   Smokeless tobacco: Never  Vaping Use   Vaping Use: Never used  Substance and Sexual Activity   Alcohol use: No   Drug use: No   Sexual activity: Yes    Birth control/protection: Surgical  Other Topics Concern   Not on file  Social History Narrative   Not on file   Social Determinants of Health   Financial Resource Strain: Low Risk    Difficulty of Paying Living Expenses: Not hard at all  Food Insecurity: No Food Insecurity   Worried About Charity fundraiser in the Last Year: Never true   [place] in the Last Year: Never true  Transportation Needs: No Transportation Needs   Lack of Transportation (Medical): No   Lack of Transportation (Non-Medical): No  Physical Activity: Sufficiently Active   Days of Exercise per Week: 5 days   Minutes of Exercise per Session: 30 min  Stress: No Stress Concern Present   Feeling of Stress : Not at all  Social Connections: Moderately Integrated   Frequency of Communication with Friends and Family: More than three times a week   Frequency of Social Gatherings with Friends and Family: More than three times a week   Attends Religious Services: More than 4 times per year   Active Member of [employer] or Organizations: No   Attends Archivist Meetings: Never   Marital Status: Married  Human resources officer Violence: Not At Risk   Fear of Current or Ex-Partner: No   Emotionally Abused: No   Physically Abused: No   Sexually Abused: No     Subjective: Review of Systems  Constitutional:  Negative for chills and fever.  HENT:  Negative for congestion and hearing loss.   Eyes:  Negative for blurred vision and double vision.  Respiratory:  Negative for cough and shortness of breath.   Cardiovascular:  Negative for chest pain and palpitations.  Gastrointestinal:  Negative for abdominal pain, blood in stool, constipation, diarrhea, heartburn, melena and vomiting.  Genitourinary:  Negative for dysuria and urgency.  Musculoskeletal:  Negative for joint pain and myalgias.  Skin:  Negative for itching and rash.  Neurological:  Negative for dizziness and headaches.  Psychiatric/Behavioral:  Negative for depression. The patient is not nervous/anxious.       Objective: BP 107/69   Pulse 99   Temp (!) 96.8 F (36 C) (Temporal)   Ht 5\' 4"  (1.626 m)   Wt 200 lb 6.4 oz (90.9 kg)   BMI 34.40 kg/m  Physical Exam Constitutional:      Appearance: Normal appearance.  HENT:     Head: Normocephalic and atraumatic.  Eyes:     Extraocular Movements: Extraocular movements intact.     Conjunctiva/sclera: Conjunctivae normal.  Cardiovascular:     Rate and Rhythm: Normal rate and regular rhythm.  Pulmonary:     Effort: Pulmonary effort is normal.     Breath sounds: Normal breath sounds.  Abdominal:     General: Bowel sounds are normal.     Palpations: Abdomen is soft.  Musculoskeletal:        General: No swelling. Normal range of motion.     Cervical back: Normal range of motion and neck supple.  Skin:    General: Skin is warm and dry.     Coloration: Skin is not jaundiced.  Neurological:     General: No focal deficit present.     Mental Status: She is alert and oriented to person, place, and time.  Psychiatric:        Mood and Affect: Mood normal.        Behavior: Behavior normal.     Assessment: *Tubular adenoma the colon *High risk medication use  Plan: Will schedule for surveillance colonoscopy.The risks  including infection, bleed, or perforation as well as benefits, limitations, alternatives and imponderables have been reviewed with the patient. Questions have been answered. All parties agreeable.  We will reach out to Dr. Harl Bowie for his blessing in regards to holding patient's Coumadin x5 days prior to procedure.  Appreciate his help with this patient.  Further recommendations to follow.  12/27/2020 2:10 PM   Disclaimer: This note was dictated with voice recognition software. Similar sounding words can inadvertently be transcribed and may not be corrected upon review.

## 2020-12-27 NOTE — Patient Instructions (Signed)
We will schedule you for colonoscopy for surveillance purposes given your history of colon polyps.  We will reach out to Dr. Harl Bowie for his blessing and holding your Coumadin for 5 days prior to procedure.  Further recommendations to follow.  It was nice to meet both of you today.  At [facility] we value your feedback. You may receive a survey about your visit today. Please share your experience as we strive to create trusting relationships with our patients to provide genuine, compassionate, quality care.  We appreciate your understanding and patience as we review any laboratory studies, imaging, and other diagnostic tests that are ordered as we care for you. Our office policy is 5 business days for review of these results, and any emergent or urgent results are addressed in a timely manner for your best interest. If you do not hear from our office in 1 week, please contact us.   We also encourage the use of MyChart, which contains your medical information for your review as well. If you are not enrolled in this feature, an access code is on this after visit summary for your convenience. Thank you for allowing [place] to be involved in your care.  It was great to see you today!  I hope you have a great rest of your summer!!    Elon Alas. Abbey Chatters, D.O. Gastroenterology and Hepatology [facility]

## 2020-12-28 ENCOUNTER — Telehealth: Payer: Self-pay | Admitting: *Deleted

## 2020-12-28 NOTE — Telephone Encounter (Signed)
Pt has appt 01/09/21 with Dr. Harl Bowie, pre op clearance to be addressed at that time. I will send notes to MD for upcoming appt. Will send FYI to requesting office pt has appt 01/09/21.

## 2020-12-28 NOTE — Telephone Encounter (Signed)
   Name: KORINE WINTON  DOB: 1951/03/03  MRN: [id number]  Primary Cardiologist: Carlyle Dolly, MD  Chart reviewed as part of pre-operative protocol coverage. She is scheduled to see Dr. Harl Bowie 01/09/21, at which time would be reasonable to address preop status as her last visit was 09/2019. "PREOP" has been added to the appointment notes so provider is aware.  Pre-op covering staff: - Please contact requesting surgeon's office via preferred method (i.e, phone, fax) to inform them of need for appointment prior to surgery.  Pharmacy: - Please comment on recommendations for holding coumadin prior to her upcoming procedure. She has a history of permanent atrial fibrillation  Thank you!  Abigail Butts, PA-C  12/28/2020, 9:05 AM

## 2020-12-28 NOTE — Telephone Encounter (Signed)
Sent request for cardiac clearances through Epic.

## 2020-12-28 NOTE — Telephone Encounter (Signed)
Patient with diagnosis of atrial fibrillation on warfarin for anticoagulation.    Procedure: colonoscopy Date of procedure: TBD   CHA2DS2-VASc Score = 4  This indicates a 4.8% annual risk of stroke. The patient's score is based upon: CHF History: No HTN History: Yes Diabetes History: Yes Stroke History: No Vascular Disease History: No Age Score: 1 Gender Score: 1    CrCl 58.7 (with IBW) Platelet count 242  Per office protocol, patient can hold warfarin for 5 days prior to procedure.   Patient will not need bridging with Lovenox (enoxaparin) around procedure.

## 2020-12-28 NOTE — Telephone Encounter (Signed)
This is a mutual pt. That are trying to schedule for a colonoscopy. Please see below.  Attention: Preop   We would like obtain cardiac clearance for this patient please.  Procedure:Colonoscopy with propofol  Date: TBD  Medication to hold: Coumadin times 5 days   Surgeon: Dr.Carver  Phone: [phone]  Fax:  [phone]  Type of Anesthesia: Propofol (ASA 3)

## 2020-12-31 NOTE — Telephone Encounter (Signed)
I agree with pharmacies recs to hold coumadin, no bridging indicated   Zandra Abts MD

## 2020-12-31 NOTE — Telephone Encounter (Signed)
Hello Dr. Harl Bowie,  Ms. Ayars has an appointment with you on 01/09/21.  I will defer her cardiac clearance to her appointment at that time.  I will remove her from preoperative cardiac clearance pool.  Thank you for your help.  Jossie Ng. Awad Gladd NP-C    12/31/2020, 1:58 PM [facility] [place] [address] Office [phone] Fax [phone]

## 2021-01-01 ENCOUNTER — Other Ambulatory Visit: Payer: Self-pay

## 2021-01-01 MED ORDER — PEG 3350-KCL-NA BICARB-NACL 420 G PO SOLR: 4000.0000 mL | ORAL | 0 refills | Status: DC

## 2021-01-01 NOTE — Telephone Encounter (Signed)
Pre-op appt 01/23/21. Appt letter mailed with procedure instructions.

## 2021-01-01 NOTE — Telephone Encounter (Signed)
Called pt, TCS ASA 3 scheduled for 01/28/21 at 9:45am. Informed her our office received ok to hold Coumadin x5 days before procedure. Rx for prep sent to pharmacy. Orders entered.   PA for TCS submitted via [facility] website. PA# [id number], valid 01/28/21-04/28/21.

## 2021-01-01 NOTE — Telephone Encounter (Signed)
Per Dr. Nelly Laurence note, no Lovenox bridging indicated, okay to hold Coumadin.  Please schedule her procedure.  Thank you Dr. Harl Bowie and the whole cardiology team for their continued input on all of our patients.

## 2021-01-09 ENCOUNTER — Encounter: Payer: Self-pay | Admitting: Cardiology

## 2021-01-09 ENCOUNTER — Other Ambulatory Visit: Payer: Self-pay

## 2021-01-09 ENCOUNTER — Ambulatory Visit (INDEPENDENT_AMBULATORY_CARE_PROVIDER_SITE_OTHER): Payer: Medicare Other | Admitting: Cardiology

## 2021-01-09 ENCOUNTER — Ambulatory Visit (INDEPENDENT_AMBULATORY_CARE_PROVIDER_SITE_OTHER): Payer: Medicare Other | Admitting: *Deleted

## 2021-01-09 VITALS — BP 122/80 | HR 89 | Ht 64.0 in | Wt 200.0 lb

## 2021-01-09 DIAGNOSIS — I480 Paroxysmal atrial fibrillation: Secondary | ICD-10-CM

## 2021-01-09 DIAGNOSIS — E782 Mixed hyperlipidemia: Secondary | ICD-10-CM | POA: Diagnosis not present

## 2021-01-09 DIAGNOSIS — I10 Essential (primary) hypertension: Secondary | ICD-10-CM

## 2021-01-09 DIAGNOSIS — Z5181 Encounter for therapeutic drug level monitoring: Secondary | ICD-10-CM | POA: Diagnosis not present

## 2021-01-09 DIAGNOSIS — R0789 Other chest pain: Secondary | ICD-10-CM

## 2021-01-09 LAB — POCT INR: INR: 1.9 — AB (ref 2.0–3.0)

## 2021-01-09 NOTE — Patient Instructions (Signed)
Medication Instructions:  Your physician recommends that you continue on your current medications as directed. Please refer to the Current Medication list given to you today.  *If you need a refill on your cardiac medications before your next appointment, please call your pharmacy*   Lab Work: None If you have labs (blood work) drawn today and your tests are completely normal, you will receive your results only by: [place] (if you have MyChart) OR A paper copy in the mail If you have any lab test that is abnormal or we need to change your treatment, we will call you to review the results.   Testing/Procedures: None   Follow-Up: At [facility], you and your health needs are our priority.  As part of our continuing mission to provide you with exceptional heart care, we have created designated Provider Care Teams.  These Care Teams include your primary Cardiologist (physician) and Advanced Practice Providers (APPs -  Physician Assistants and Nurse Practitioners) who all work together to provide you with the care you need, when you need it.  We recommend signing up for the patient portal called "MyChart".  Sign up information is provided on this After Visit Summary.  MyChart is used to connect with patients for Virtual Visits (Telemedicine).  Patients are able to view lab/test results, encounter notes, upcoming appointments, etc.  Non-urgent messages can be sent to your provider as well.   To learn more about what you can do with MyChart, go to [email or web address].    Your next appointment:   1 year(s)  The format for your next appointment:   In Person  Provider:   Carlyle Dolly, MD   Other Instructions

## 2021-01-09 NOTE — Patient Instructions (Signed)
Increase warfarin to 1 tablet daily except 1/2 tablet on Tuesdays and Saturdays Scheduled for endoscopy on 8/15.  Will hold warfarin 5 days before procedure and resume night of procedure is ok with MD.  No lovenox bridge needed. Recheck 1 week after endoscopy

## 2021-01-09 NOTE — Progress Notes (Signed)
Clinical Summary Samantha Vega is a 70 y.o.female seen today for follow up of the following medical problems.   1. Chest pain - history of atypical chest pain - she denies any recent symptoms.    - no chest pains     2. Paroxysmal afib - rate control strategy w/ diltiazem and metoprolol   - previously on xarelto however cost was too much, changed to coumadin.      - no recent palpitations - compliant with meds - no bleeding on coumadin   3. HTN   - compliant with meds   4. HL   - compliant with statin   07/2020 TC 153 TG 76 HDL 38 LDL 100   5. LE edema - takes her prn lasix daily which controls her swelling    Past Medical History:  Diagnosis Date   Annual physical exam 06/07/2015   Arthritis    Baker's cyst of knee 10/04/2013   Chest pain    Noncardiac; evaluated and treated in 2008   Diabetes mellitus    Dyspnea on exertion 10/14/2019   Enlarged heart    Hyperlipidemia    Hypertension    Metabolic syndrome X [id number]   OA (osteoarthritis) of knee 05/05/2012   Left, takes tramadol on avg twice per week   Obesity    PAF (paroxysmal atrial fibrillation) (HCC)    Right bundle Samantha Vega block    +Palpitations.   EKG 11/2006:  NSR, RBBB, LAFB, markedly delayed R-wave progression, no change; Echocardiogram in 10/2006-suboptimal quality, normal; Event recorder-PVCs, no significant arrhythmias or symptoms ;   TIA (transient ischemic attack) 06/02/2019   Unspecified visual loss 12/08/2008     No Known Allergies   Current Outpatient Medications  Medication Sig Dispense Refill   atorvastatin (LIPITOR) 80 MG tablet Take 1 tablet (80 mg total) by mouth daily. 90 tablet 3   benazepril (LOTENSIN) 20 MG tablet TAKE 1 TABLET BY MOUTH EVERY DAY 90 tablet 1   diltiazem (CARDIZEM CD) 120 MG 24 hr capsule TAKE 1 CAPSULE BY MOUTH EVERY DAY 90 capsule 1   furosemide (LASIX) 20 MG tablet TAKE 1 TABLET (20 MG TOTAL) BY MOUTH DAILY AS NEEDED FOR FLUID. 90 tablet 1   metFORMIN  (GLUCOPHAGE-XR) 500 MG 24 hr tablet Take 1 tablet (500 mg total) by mouth daily with breakfast. 90 tablet 1   metoprolol tartrate (LOPRESSOR) 100 MG tablet TAKE 1 TABLET BY MOUTH TWICE A DAY 180 tablet 0   polyethylene glycol-electrolytes (TRILYTE) 420 g solution Take 4,000 mLs by mouth as directed. 4000 mL 0   warfarin (COUMADIN) 5 MG tablet TAKE 1 TABLET DAILY EXCEPT 1/2 TABLET ON SUNDAY AND THURSDAY OR AS DIRECTED BY COUMADIN CLINIC 100 tablet 3   No current facility-administered medications for this visit.     Past Surgical History:  Procedure Laterality Date   ABDOMINAL HYSTERECTOMY     BREAST BIOPSY Left    benign   CATARACT EXTRACTION W/PHACO Right 01/08/2016   Procedure: CATARACT EXTRACTION PHACO AND INTRAOCULAR LENS PLACEMENT ([place]);  Surgeon: Rutherford Guys, MD;  Location: [facility];  Service: Ophthalmology;  Laterality: Right;  CDE: 5.78   CATARACT EXTRACTION W/PHACO Left 02/05/2016   Procedure: CATARACT EXTRACTION PHACO AND INTRAOCULAR LENS PLACEMENT LEFT EYE CDE=6.50;  Surgeon: Rutherford Guys, MD;  Location: [facility];  Service: Ophthalmology;  Laterality: Left;   COLONOSCOPY  2006   Dr. Tamala Julian: Normal, repeat in 2009.   COLONOSCOPY N/A 10/08/2015   Procedure: COLONOSCOPY;  Surgeon:  Danie Binder, MD;  [facility];  Service: Endoscopy;  Laterality: N/A;  230 - moved to 1:30, spoke with pt    POLYPECTOMY  10/08/2015   Procedure: POLYPECTOMY;  Surgeon: Danie Binder, MD;  [facility];  Service: Endoscopy;;  cecal polyp removed via cold forceps with one clip placed/ rectal polyp removed via cold forcep   SHOULDER SURGERY  2006   Left-post-trauma   TOTAL ABDOMINAL HYSTERECTOMY W/ BILATERAL SALPINGOOPHORECTOMY       No Known Allergies    Family History  Problem Relation Age of Onset   Hypertension Mother    Arthritis Mother    Stroke Father    Hypertension Father    Coronary artery disease Father    Diabetes Sister    Sarcoidosis Sister    Diabetes Sister     Heart failure Brother    Congestive Heart Failure Brother    Colon cancer Neg Hx      Social History Samantha Vega reports that she has never smoked. She has never used smokeless tobacco. Samantha Vega reports no history of alcohol use.   Review of Systems CONSTITUTIONAL: No weight loss, fever, chills, weakness or fatigue.  HEENT: Eyes: No visual loss, blurred vision, double vision or yellow sclerae.No hearing loss, sneezing, congestion, runny nose or sore throat.  SKIN: No rash or itching.  CARDIOVASCULAR: per hpi RESPIRATORY: No shortness of breath, cough or sputum.  GASTROINTESTINAL: No anorexia, nausea, vomiting or diarrhea. No abdominal pain or blood.  GENITOURINARY: No burning on urination, no polyuria NEUROLOGICAL: No headache, dizziness, syncope, paralysis, ataxia, numbness or tingling in the extremities. No change in bowel or bladder control.  MUSCULOSKELETAL: No muscle, back pain, joint pain or stiffness.  LYMPHATICS: No enlarged nodes. No history of splenectomy.  PSYCHIATRIC: No history of depression or anxiety.  ENDOCRINOLOGIC: No reports of sweating, cold or heat intolerance. No polyuria or polydipsia.  Marland Kitchen   Physical Examination Vitals:   01/09/21 1407  BP: 122/80  Pulse: 89  SpO2: 95%   Filed Weights   01/09/21 1407  Weight: 200 lb (90.7 kg)    Gen: resting comfortably, no acute distress HEENT: no scleral icterus, pupils equal round and reactive, no palptable cervical adenopathy,  CV: irreg, no mr/g no jvd Resp: Clear to auscultation bilaterally GI: abdomen is soft, non-tender, non-distended, normal bowel sounds, no hepatosplenomegaly MSK: extremities are warm, no edema.  Skin: warm, no rash Neuro:  no focal deficits Psych: appropriate affect   Diagnostic Studies     Assessment and Plan  1. Chest pain No recent issues, continue to monitor     2. Parox afib   . CHADS2Vasc score is 3, continue anticoag. DOACs too expensive, - she is doing well,  continue current meds   3. HTN:   -bp at goal, continue current meds   4. HL:   -lipids at goal, continue atorvasatin   5. Leg edema - edema is well controlled, continue lasix  6. Preoperative evaluation - no contraindication for colonscopy from cardaic standpoint - hold coumadin 5 days prior, resume day after. No bridiging is indicated   F/u 1 year     Arnoldo Lenis, M.D.

## 2021-01-16 NOTE — Patient Instructions (Signed)
Samantha Vega  01/16/2021     '@PREFPERIOPPHARMACY'$ @   Your procedure is scheduled on  01/28/2021.   [facility]   Call this number if you have problems the morning of surgery:  [phone]   Remember:  Follow the diet instructions given to you by the office.    Take these medicines the morning of surgery with A SIP OF WATER                             cardiazem, metoprolol.     Do not wear jewelry, make-up or nail polish.  Do not wear lotions, powders, or perfumes, or deodorant.  Do not shave 48 hours prior to surgery.  Men may shave face and neck.  Do not bring valuables to the hospital.  [facility] is not responsible for any belongings or valuables.  Contacts, dentures or bridgework may not be worn into surgery.  Leave your suitcase in the car.  After surgery it may be brought to your room.  For patients admitted to the hospital, discharge time will be determined by your treatment team.  Patients discharged the day of surgery will not be allowed to drive home and must have someone with them for 24 hours.    Special instructions:   DO NOT smoke tobacco or ape for 24 hours before your procedure.  Please read over the following fact sheets that you were given. Anesthesia Post-op Instructions and Care and Recovery After Surgery      Colonoscopy, Adult, Care After This sheet gives you information about how to care for yourself after your procedure. Your health care provider may also give you more specific instructions. If you have problems or questions, contact your health careprovider. What can I expect after the procedure? After the procedure, it is common to have: A small amount of blood in your stool for 24 hours after the procedure. Some gas. Mild cramping or bloating of your abdomen. Follow these instructions at home: Eating and drinking  Drink enough fluid to keep your urine pale yellow. Follow instructions from your health  care provider about eating or drinking restrictions. Resume your normal diet as instructed by your health care provider. Avoid heavy or fried foods that are hard to digest.  Activity Rest as told by your health care provider. Avoid sitting for a long time without moving. Get up to take short walks every 1-2 hours. This is important to improve blood flow and breathing. Ask for help if you feel weak or unsteady. Return to your normal activities as told by your health care provider. Ask your health care provider what activities are safe for you. Managing cramping and bloating  Try walking around when you have cramps or feel bloated. Apply heat to your abdomen as told by your health care provider. Use the heat source that your health care provider recommends, such as a moist heat pack or a heating pad. Place a towel between your skin and the heat source. Leave the heat on for 20-30 minutes. Remove the heat if your skin turns bright red. This is especially important if you are unable to feel pain, heat, or cold. You may have a greater risk of getting burned.  General instructions If you were given a sedative during the procedure, it can affect you for several hours. Do not drive or operate machinery until your health  care provider says that it is safe. For the first 24 hours after the procedure: Do not sign important documents. Do not drink alcohol. Do your regular daily activities at a slower pace than normal. Eat soft foods that are easy to digest. Take over-the-counter and prescription medicines only as told by your health care provider. Keep all follow-up visits as told by your health care provider. This is important. Contact a health care provider if: You have blood in your stool 2-3 days after the procedure. Get help right away if you have: More than a small spotting of blood in your stool. Large blood clots in your stool. Swelling of your abdomen. Nausea or vomiting. A  fever. Increasing pain in your abdomen that is not relieved with medicine. Summary After the procedure, it is common to have a small amount of blood in your stool. You may also have mild cramping and bloating of your abdomen. If you were given a sedative during the procedure, it can affect you for several hours. Do not drive or operate machinery until your health care provider says that it is safe. Get help right away if you have a lot of blood in your stool, nausea or vomiting, a fever, or increased pain in your abdomen. This information is not intended to replace advice given to you by your health care provider. Make sure you discuss any questions you have with your healthcare provider. Document Revised: 05/27/2019 Document Reviewed: 12/27/2018 Elsevier Patient Education  [place] After This sheet gives you information about how to care for yourself after your procedure. Your health care provider may also give you more specific instructions. If you have problems or questions, contact your health careprovider. What can I expect after the procedure? After the procedure, it is common to have: Tiredness. Forgetfulness about what happened after the procedure. Impaired judgment for important decisions. Nausea or vomiting. Some difficulty with balance. Follow these instructions at home: For the time period you were told by your health care provider:     Rest as needed. Do not participate in activities where you could fall or become injured. Do not drive or use machinery. Do not drink alcohol. Do not take sleeping pills or medicines that cause drowsiness. Do not make important decisions or sign legal documents. Do not take care of children on your own. Eating and drinking Follow the diet that is recommended by your health care provider. Drink enough fluid to keep your urine pale yellow. If you vomit: Drink water, juice, or soup when you can drink  without vomiting. Make sure you have little or no nausea before eating solid foods. General instructions Have a responsible adult stay with you for the time you are told. It is important to have someone help care for you until you are awake and alert. Take over-the-counter and prescription medicines only as told by your health care provider. If you have sleep apnea, surgery and certain medicines can increase your risk for breathing problems. Follow instructions from your health care provider about wearing your sleep device: Anytime you are sleeping, including during daytime naps. While taking prescription pain medicines, sleeping medicines, or medicines that make you drowsy. Avoid smoking. Keep all follow-up visits as told by your health care provider. This is important. Contact a health care provider if: You keep feeling nauseous or you keep vomiting. You feel light-headed. You are still sleepy or having trouble with balance after 24 hours. You develop a rash. You have  a fever. You have redness or swelling around the IV site. Get help right away if: You have trouble breathing. You have new-onset confusion at home. Summary For several hours after your procedure, you may feel tired. You may also be forgetful and have poor judgment. Have a responsible adult stay with you for the time you are told. It is important to have someone help care for you until you are awake and alert. Rest as told. Do not drive or operate machinery. Do not drink alcohol or take sleeping pills. Get help right away if you have trouble breathing, or if you suddenly become confused. This information is not intended to replace advice given to you by your health care provider. Make sure you discuss any questions you have with your healthcare provider. Document Revised: 02/16/2020 Document Reviewed: 05/05/2019 Elsevier Patient Education  2022 [employer].

## 2021-01-21 ENCOUNTER — Other Ambulatory Visit: Payer: Self-pay

## 2021-01-21 ENCOUNTER — Ambulatory Visit (INDEPENDENT_AMBULATORY_CARE_PROVIDER_SITE_OTHER): Payer: Medicare Other

## 2021-01-21 VITALS — BP 138/70 | HR 76 | Temp 98.5°F | Resp 20 | Ht 62.0 in | Wt 203.0 lb

## 2021-01-21 DIAGNOSIS — E1159 Type 2 diabetes mellitus with other circulatory complications: Secondary | ICD-10-CM | POA: Diagnosis not present

## 2021-01-21 DIAGNOSIS — L608 Other nail disorders: Secondary | ICD-10-CM

## 2021-01-21 DIAGNOSIS — Z0000 Encounter for general adult medical examination without abnormal findings: Secondary | ICD-10-CM

## 2021-01-21 DIAGNOSIS — Z0001 Encounter for general adult medical examination with abnormal findings: Secondary | ICD-10-CM | POA: Diagnosis not present

## 2021-01-21 DIAGNOSIS — Z0100 Encounter for examination of eyes and vision without abnormal findings: Secondary | ICD-10-CM | POA: Diagnosis not present

## 2021-01-21 NOTE — Progress Notes (Signed)
Subjective:   NYISHA Vega is a 70 y.o. female who presents for Medicare Annual (Subsequent) preventive examination.        Objective:    There were no vitals filed for this visit. There is no height or weight on file to calculate BMI.  Advanced Directives 03/15/2020 10/14/2019 06/02/2019 02/14/2018 02/19/2017 10/09/2016 02/05/2016  Does Patient Have a Medical Advance Directive? No No No No No No No  Would patient like information on creating a medical advance directive? No - Patient declined - No - Patient declined - - Yes (MAU/Ambulatory/Procedural Areas - Information given) No - patient declined information  Pre-existing out of facility DNR order (yellow form or pink MOST form) - - - - - - -    Current Medications (verified) Outpatient Encounter Medications as of 01/21/2021  Medication Sig   atorvastatin (LIPITOR) 80 MG tablet Take 1 tablet (80 mg total) by mouth daily.   benazepril (LOTENSIN) 20 MG tablet TAKE 1 TABLET BY MOUTH EVERY DAY   diltiazem (CARDIZEM CD) 120 MG 24 hr capsule TAKE 1 CAPSULE BY MOUTH EVERY DAY   furosemide (LASIX) 20 MG tablet TAKE 1 TABLET (20 MG TOTAL) BY MOUTH DAILY AS NEEDED FOR FLUID.   metFORMIN (GLUCOPHAGE-XR) 500 MG 24 hr tablet Take 1 tablet (500 mg total) by mouth daily with breakfast.   metoprolol tartrate (LOPRESSOR) 100 MG tablet TAKE 1 TABLET BY MOUTH TWICE A DAY   polyethylene glycol-electrolytes (TRILYTE) 420 g solution Take 4,000 mLs by mouth as directed.   warfarin (COUMADIN) 5 MG tablet TAKE 1 TABLET DAILY EXCEPT 1/2 TABLET ON SUNDAY AND THURSDAY OR AS DIRECTED BY COUMADIN CLINIC   No facility-administered encounter medications on file as of 01/21/2021.    Allergies (verified) Patient has no known allergies.   History: Past Medical History:  Diagnosis Date   Annual physical exam 06/07/2015   Arthritis    Baker's cyst of knee 10/04/2013   Chest pain    Noncardiac; evaluated and treated in 2008   Diabetes mellitus    Dyspnea on  exertion 10/14/2019   Enlarged heart    Hyperlipidemia    Hypertension    Metabolic syndrome X [id number]   OA (osteoarthritis) of knee 05/05/2012   Left, takes tramadol on avg twice per week   Obesity    PAF (paroxysmal atrial fibrillation) (HCC)    Right bundle branch block    +Palpitations.   EKG 11/2006:  NSR, RBBB, LAFB, markedly delayed R-wave progression, no change; Echocardiogram in 10/2006-suboptimal quality, normal; Event recorder-PVCs, no significant arrhythmias or symptoms ;   TIA (transient ischemic attack) 06/02/2019   Unspecified visual loss 12/08/2008   Past Surgical History:  Procedure Laterality Date   ABDOMINAL HYSTERECTOMY     BREAST BIOPSY Left    benign   CATARACT EXTRACTION W/PHACO Right 01/08/2016   Procedure: CATARACT EXTRACTION PHACO AND INTRAOCULAR LENS PLACEMENT ([place]);  Surgeon: Rutherford Guys, MD;  Location: [facility];  Service: Ophthalmology;  Laterality: Right;  CDE: 5.78   CATARACT EXTRACTION W/PHACO Left 02/05/2016   Procedure: CATARACT EXTRACTION PHACO AND INTRAOCULAR LENS PLACEMENT LEFT EYE CDE=6.50;  Surgeon: Rutherford Guys, MD;  Location: [facility];  Service: Ophthalmology;  Laterality: Left;   COLONOSCOPY  2006   Dr. Tamala Julian: Normal, repeat in 2009.   COLONOSCOPY N/A 10/08/2015   Procedure: COLONOSCOPY;  Surgeon: Danie Binder, MD;  Location: [facility];  Service: Endoscopy;  Laterality: N/A;  230 - moved to 1:30, spoke with pt  POLYPECTOMY  10/08/2015   Procedure: POLYPECTOMY;  Surgeon: Danie Binder, MD;  Location: [facility];  Service: Endoscopy;;  cecal polyp removed via cold forceps with one clip placed/ rectal polyp removed via cold forcep   SHOULDER SURGERY  2006   Left-post-trauma   TOTAL ABDOMINAL HYSTERECTOMY W/ BILATERAL SALPINGOOPHORECTOMY     Family History  Problem Relation Age of Onset   Hypertension Mother    Arthritis Mother    Stroke Father    Hypertension Father    Coronary artery disease Father    Diabetes Sister    Sarcoidosis  Sister    Diabetes Sister    Heart failure Brother    Congestive Heart Failure Brother    Colon cancer Neg Hx    Social History   Socioeconomic History   Marital status: Married    Spouse name: Not on file   Number of children: 0   Years of education: Not on file   Highest education level: Not on file  Occupational History   Occupation: Museum/gallery exhibitions officer: UNEMPLOYED  Tobacco Use   Smoking status: Never   Smokeless tobacco: Never  Vaping Use   Vaping Use: Never used  Substance and Sexual Activity   Alcohol use: No   Drug use: No   Sexual activity: Yes    Birth control/protection: Surgical  Other Topics Concern   Not on file  Social History Narrative   Not on file   Social Determinants of Health   Financial Resource Strain: Low Risk    Difficulty of Paying Living Expenses: Not hard at all  Food Insecurity: No Food Insecurity   Worried About Charity fundraiser in the Last Year: Never true   [place] in the Last Year: Never true  Transportation Needs: No Transportation Needs   Lack of Transportation (Medical): No   Lack of Transportation (Non-Medical): No  Physical Activity: Sufficiently Active   Days of Exercise per Week: 5 days   Minutes of Exercise per Session: 30 min  Stress: No Stress Concern Present   Feeling of Stress : Not at all  Social Connections: Moderately Integrated   Frequency of Communication with Friends and Family: More than three times a week   Frequency of Social Gatherings with Friends and Family: More than three times a week   Attends Religious Services: More than 4 times per year   Active Member of [employer] or Organizations: No   Attends Archivist Meetings: Never   Marital Status: Married    Tobacco Counseling Counseling given: Not Answered   Clinical Intake:                 Diabetic? Yes          Activities of Daily Living No flowsheet data found.  Patient Care Team: Fayrene Helper,  MD as PCP - General Branch, Alphonse Guild, MD as PCP - Cardiology (Cardiology) Harl Bowie Alphonse Guild, MD as Consulting Physician (Cardiology) Eloise Harman, DO as Consulting Physician (Internal Medicine)  Indicate any recent Medical Services you may have received from other than [facility] providers in the past year (date may be approximate).     Assessment:   This is a routine wellness examination for Chelise.  Hearing/Vision screen No results found.  Dietary issues and exercise activities discussed:     Goals Addressed   None   Depression Screen PHQ 2/9 Scores 11/05/2020 09/12/2020 07/24/2020 02/21/2020 01/19/2020 10/14/2019 08/10/2019  PHQ -  2 Score 0 0 0 0 0 0 0  PHQ- 9 Score - - - - 0 1 -    Fall Risk Fall Risk  11/15/2020 11/05/2020 09/12/2020 07/24/2020 03/15/2020  Falls in the past year? 0 0 0 0 0  Number falls in past yr: 0 0 0 0 0  Injury with Fall? 0 0 0 0 0  Risk for fall due to : - No Fall Risks No Fall Risks No Fall Risks -  Follow up - Falls evaluation completed Falls evaluation completed Falls evaluation completed -    FALL RISK PREVENTION PERTAINING TO THE HOME:  Any stairs in or around the home? No  If so, are there any without handrails? No  Home free of loose throw rugs in walkways, pet beds, electrical cords, etc? Yes  Adequate lighting in your home to reduce risk of falls? Yes   ASSISTIVE DEVICES UTILIZED TO PREVENT FALLS:  Life alert? No  Use of a cane, walker or w/c? No  Grab bars in the bathroom? No  Shower chair or bench in shower? No  Elevated toilet seat or a handicapped toilet? No   TIMED UP AND GO:  Was the test performed? Yes .  Length of time to ambulate 10 feet: 30 sec.   Gait steady and fast without use of assistive device  Cognitive Function:     6CIT Screen 01/19/2020 01/18/2019 01/12/2018 10/09/2016  What Year? 0 points 0 points 0 points 0 points  What month? 0 points 0 points 0 points 0 points  What time? 0 points 0 points 0 points 0 points  Count  back from 20 4 points 4 points 2 points 0 points  Months in reverse 4 points 4 points 4 points 0 points  Repeat phrase 10 points 0 points 0 points 0 points  Total Score '18 8 6 '$ 0    Immunizations Immunization History  Administered Date(s) Administered   Fluad Quad(high Dose 65+) 04/04/2019, 02/21/2020   Influenza Split 04/22/2011, 04/16/2012   Influenza Whole 03/23/2007, 05/18/2009   Influenza,inj,Quad PF,6+ Mos 04/25/2013, 05/30/2014, 06/07/2015, 02/12/2016, 04/13/2017, 02/25/2018   Moderna Sars-Covid-2 Vaccination 09/02/2019, 10/05/2019, 06/01/2020   Pneumococcal Conjugate-13 09/06/2014   Pneumococcal Polysaccharide-23 10/31/2009, 02/12/2016   Td 06/17/2003   Tdap 04/22/2011   Zoster, Live 05/18/2009    TDAP status: Up to date  Flu Vaccine status: Up to date  Pneumococcal vaccine status: Up to date  Covid-19 vaccine status: Completed vaccines  Qualifies for Shingles Vaccine? Yes   Zostavax completed No   Shingrix Completed?: No.    Education has been provided regarding the importance of this vaccine. Patient has been advised to call insurance company to determine out of pocket expense if they have not yet received this vaccine. Advised may also receive vaccine at local pharmacy or Health Dept. Verbalized acceptance and understanding.  Screening Tests Health Maintenance  Topic Date Due   Zoster Vaccines- Shingrix (1 of 2) Never done   COLONOSCOPY (Pts 45-31yr Insurance coverage will need to be confirmed)  10/08/2020   OPHTHALMOLOGY EXAM  01/01/2021   INFLUENZA VACCINE  01/14/2021   HEMOGLOBIN A1C  01/21/2021   TETANUS/TDAP  04/21/2021   COVID-19 Vaccine (5 - Booster for Moderna series) 05/07/2021   FOOT EXAM  11/05/2021   MAMMOGRAM  12/04/2022   DEXA SCAN  Completed   Hepatitis C Screening  Completed   PNA vac Low Risk Adult  Completed   HPV VACCINES  Aged Out    Health Maintenance  Health Maintenance Due  Topic Date Due   Zoster Vaccines- Shingrix (1 of 2)  Never done   COLONOSCOPY (Pts 45-67yr Insurance coverage will need to be confirmed)  10/08/2020   OPHTHALMOLOGY EXAM  01/01/2021   INFLUENZA VACCINE  01/14/2021   HEMOGLOBIN A1C  01/21/2021    Colorectal cancer screening: Referral to GI placed procedure 01/28/21. Pt aware the office will call re: appt.  Mammogram status: Completed normal. Repeat every year  Bone Density status: Completed normal. Results reflect: Bone density results: NORMAL. Repeat every 10 years.  Lung Cancer Screening: (Low Dose CT Chest recommended if Age 72-80years, 30 pack-year currently smoking OR have quit w/in 15years.) does not qualify.     Additional Screening:  Hepatitis C Screening: does qualify; Completed.   Vision Screening: Recommended annual ophthalmology exams for early detection of glaucoma and other disorders of the eye. Is the patient up to date with their annual eye exam?  Yes  Who is the provider or what is the name of the office in which the patient attends annual eye exams? Previously seen Dr. CJorja Loa- [facility] NHazel Samsa new referral.  If pt is not established with a provider, would they like to be referred to a provider to establish care? Yes .   Dental Screening: Recommended annual dental exams for proper oral hygiene  Community Resource Referral / Chronic Care Management: CRR required this visit?  No   CCM required this visit?  No      Plan:     I have personally reviewed and noted the following in the patient's chart:   Medical and social history Use of alcohol, tobacco or illicit drugs  Current medications and supplements including opioid prescriptions.  Functional ability and status Nutritional status Physical activity Advanced directives List of other physicians Hospitalizations, surgeries, and ER visits in previous 12 months Vitals Screenings to include cognitive, depression, and falls Referrals and appointments  In addition, I have reviewed and discussed with  patient certain preventive protocols, quality metrics, and best practice recommendations. A written personalized care plan for preventive services as well as general preventive health recommendations were provided to patient.     ALonn Georgia LPN   [id number]  Nurse Notes: Pt consents to annual wellness visit today via in office face to face encounter. Provider off site at this time. This visit took approx. 25 minutes to complete. Pt is compliant with health maintenance gaps and would like podiatry and opthalmology referral.

## 2021-01-21 NOTE — Patient Instructions (Signed)
Samantha Vega , Thank you for taking time to come for your Medicare Wellness Visit. I appreciate your ongoing commitment to your health goals. Please review the following plan we discussed and let me know if I can assist you in the future.   Screening recommendations/referrals: Colonoscopy: 01/28/21 scheduled.  Mammogram: Not due until 12/04/2022 Bone Density: Completed  Recommended yearly ophthalmology/optometry visit for glaucoma screening and checkup Recommended yearly dental visit for hygiene and checkup  Vaccinations: Influenza vaccine: Fall 2022  Pneumococcal vaccine: completed  Tdap vaccine: Due 05/07/21  Shingles vaccine: Eligible for, check with a local pharmacy for pricing.     Advanced directives: Not at this time.   Conditions/risks identified: None   Next appointment: 07/24/2021 with Dr. Moshe Cipro. Today we will put in a Podiatry referral (foot doctor) and an Opthamology referral to the eye doctor for an eye exam.    Preventive Care 72 Years and Older, Female Preventive care refers to lifestyle choices and visits with your health care provider that can promote health and wellness. What does preventive care include? A yearly physical exam. This is also called an annual well check. Dental exams once or twice a year. Routine eye exams. Ask your health care provider how often you should have your eyes checked. Personal lifestyle choices, including: Daily care of your teeth and gums. Regular physical activity. Eating a healthy diet. Avoiding tobacco and drug use. Limiting alcohol use. Practicing safe sex. Taking low-dose aspirin every day. Taking vitamin and mineral supplements as recommended by your health care provider. What happens during an annual well check? The services and screenings done by your health care provider during your annual well check will depend on your age, overall health, lifestyle risk factors, and family history of disease. Counseling  Your health  care provider may ask you questions about your: Alcohol use. Tobacco use. Drug use. Emotional well-being. Home and relationship well-being. Sexual activity. Eating habits. History of falls. Memory and ability to understand (cognition). Work and work Statistician. Reproductive health. Screening  You may have the following tests or measurements: Height, weight, and BMI. Blood pressure. Lipid and cholesterol levels. These may be checked every 5 years, or more frequently if you are over 39 years old. Skin check. Lung cancer screening. You may have this screening every year starting at age 49 if you have a 30-pack-year history of smoking and currently smoke or have quit within the past 15 years. Fecal occult blood test (FOBT) of the stool. You may have this test every year starting at age 32. Flexible sigmoidoscopy or colonoscopy. You may have a sigmoidoscopy every 5 years or a colonoscopy every 10 years starting at age 24. Hepatitis C blood test. Hepatitis B blood test. Sexually transmitted disease (STD) testing. Diabetes screening. This is done by checking your blood sugar (glucose) after you have not eaten for a while (fasting). You may have this done every 1-3 years. Bone density scan. This is done to screen for osteoporosis. You may have this done starting at age 37. Mammogram. This may be done every 1-2 years. Talk to your health care provider about how often you should have regular mammograms. Talk with your health care provider about your test results, treatment options, and if necessary, the need for more tests. Vaccines  Your health care provider may recommend certain vaccines, such as: Influenza vaccine. This is recommended every year. Tetanus, diphtheria, and acellular pertussis (Tdap, Td) vaccine. You may need a Td booster every 10 years. Zoster vaccine. You may  need this after age 9. Pneumococcal 13-valent conjugate (PCV13) vaccine. One dose is recommended after age  20. Pneumococcal polysaccharide (PPSV23) vaccine. One dose is recommended after age 40. Talk to your health care provider about which screenings and vaccines you need and how often you need them. This information is not intended to replace advice given to you by your health care provider. Make sure you discuss any questions you have with your health care provider. Document Released: 06/29/2015 Document Revised: 02/20/2016 Document Reviewed: 04/03/2015 Elsevier Interactive Patient Education  2017 [place] Prevention in the Home Falls can cause injuries. They can happen to people of all ages. There are many things you can do to make your home safe and to help prevent falls. What can I do on the outside of my home? Regularly fix the edges of walkways and driveways and fix any cracks. Remove anything that might make you trip as you walk through a door, such as a raised step or threshold. Trim any bushes or trees on the path to your home. Use bright outdoor lighting. Clear any walking paths of anything that might make someone trip, such as rocks or tools. Regularly check to see if handrails are loose or broken. Make sure that both sides of any steps have handrails. Any raised decks and porches should have guardrails on the edges. Have any leaves, snow, or ice cleared regularly. Use sand or salt on walking paths during winter. Clean up any spills in your garage right away. This includes oil or grease spills. What can I do in the bathroom? Use night lights. Install grab bars by the toilet and in the tub and shower. Do not use towel bars as grab bars. Use non-skid mats or decals in the tub or shower. If you need to sit down in the shower, use a plastic, non-slip stool. Keep the floor dry. Clean up any water that spills on the floor as soon as it happens. Remove soap buildup in the tub or shower regularly. Attach bath mats securely with double-sided non-slip rug tape. Do not have throw  rugs and other things on the floor that can make you trip. What can I do in the bedroom? Use night lights. Make sure that you have a light by your bed that is easy to reach. Do not use any sheets or blankets that are too big for your bed. They should not hang down onto the floor. Have a firm chair that has side arms. You can use this for support while you get dressed. Do not have throw rugs and other things on the floor that can make you trip. What can I do in the kitchen? Clean up any spills right away. Avoid walking on wet floors. Keep items that you use a lot in easy-to-reach places. If you need to reach something above you, use a strong step stool that has a grab bar. Keep electrical cords out of the way. Do not use floor polish or wax that makes floors slippery. If you must use wax, use non-skid floor wax. Do not have throw rugs and other things on the floor that can make you trip. What can I do with my stairs? Do not leave any items on the stairs. Make sure that there are handrails on both sides of the stairs and use them. Fix handrails that are broken or loose. Make sure that handrails are as long as the stairways. Check any carpeting to make sure that it is firmly attached  to the stairs. Fix any carpet that is loose or worn. Avoid having throw rugs at the top or bottom of the stairs. If you do have throw rugs, attach them to the floor with carpet tape. Make sure that you have a light switch at the top of the stairs and the bottom of the stairs. If you do not have them, ask someone to add them for you. What else can I do to help prevent falls? Wear shoes that: Do not have high heels. Have rubber bottoms. Are comfortable and fit you well. Are closed at the toe. Do not wear sandals. If you use a stepladder: Make sure that it is fully opened. Do not climb a closed stepladder. Make sure that both sides of the stepladder are locked into place. Ask someone to hold it for you, if  possible. Clearly mark and make sure that you can see: Any grab bars or handrails. First and last steps. Where the edge of each step is. Use tools that help you move around (mobility aids) if they are needed. These include: Canes. Walkers. Scooters. Crutches. Turn on the lights when you go into a dark area. Replace any light bulbs as soon as they burn out. Set up your furniture so you have a clear path. Avoid moving your furniture around. If any of your floors are uneven, fix them. If there are any pets around you, be aware of where they are. Review your medicines with your doctor. Some medicines can make you feel dizzy. This can increase your chance of falling. Ask your doctor what other things that you can do to help prevent falls. This information is not intended to replace advice given to you by your health care provider. Make sure you discuss any questions you have with your health care provider. Document Released: 03/29/2009 Document Revised: 11/08/2015 Document Reviewed: 07/07/2014 Elsevier Interactive Patient Education  2017 [employer].

## 2021-01-23 ENCOUNTER — Other Ambulatory Visit: Payer: Self-pay

## 2021-01-23 ENCOUNTER — Encounter (HOSPITAL_COMMUNITY)
Admission: RE | Admit: 2021-01-23 | Discharge: 2021-01-23 | Disposition: A | Discharge status: Home / Self Care | Payer: Medicare Other | Source: Ambulatory Visit | Attending: Internal Medicine | Admitting: Internal Medicine

## 2021-01-23 DIAGNOSIS — Z01812 Encounter for preprocedural laboratory examination: Secondary | ICD-10-CM | POA: Insufficient documentation

## 2021-01-23 LAB — BASIC METABOLIC PANEL
Anion gap: 5 (ref 5–15)
BUN: 18 mg/dL (ref 8–23)
CO2: 26 mmol/L (ref 22–32)
Calcium: 9 mg/dL (ref 8.9–10.3)
Chloride: 107 mmol/L (ref 98–111)
Creatinine, Ser: 0.83 mg/dL (ref 0.44–1.00)
GFR, Estimated: >60 mL/min (ref >60)
Glucose, Bld: 90 mg/dL (ref 70–99)
Potassium: 3.7 mmol/L (ref 3.5–5.1)
Sodium: 138 mmol/L (ref 135–145)

## 2021-01-24 ENCOUNTER — Other Ambulatory Visit: Payer: Self-pay | Admitting: Nurse Practitioner

## 2021-01-28 ENCOUNTER — Ambulatory Visit (HOSPITAL_COMMUNITY): Payer: Medicare Other | Admitting: Anesthesiology

## 2021-01-28 ENCOUNTER — Other Ambulatory Visit: Payer: Self-pay

## 2021-01-28 ENCOUNTER — Ambulatory Visit (HOSPITAL_COMMUNITY)
Admission: RE | Admit: 2021-01-28 | Discharge: 2021-01-28 | Disposition: A | Discharge status: Home / Self Care | Payer: Medicare Other | Attending: Internal Medicine | Admitting: Internal Medicine

## 2021-01-28 ENCOUNTER — Encounter (HOSPITAL_COMMUNITY): Payer: Self-pay

## 2021-01-28 ENCOUNTER — Encounter (HOSPITAL_COMMUNITY)
Admission: RE | Disposition: A | Discharge status: Home / Self Care | Payer: Self-pay | Source: Home / Self Care | Attending: Internal Medicine

## 2021-01-28 DIAGNOSIS — I4891 Unspecified atrial fibrillation: Secondary | ICD-10-CM | POA: Insufficient documentation

## 2021-01-28 DIAGNOSIS — K5730 Diverticulosis of large intestine without perforation or abscess without bleeding: Secondary | ICD-10-CM | POA: Diagnosis not present

## 2021-01-28 DIAGNOSIS — Z7901 Long term (current) use of anticoagulants: Secondary | ICD-10-CM | POA: Insufficient documentation

## 2021-01-28 DIAGNOSIS — Z86010 Personal history of colonic polyps: Secondary | ICD-10-CM

## 2021-01-28 DIAGNOSIS — D125 Benign neoplasm of sigmoid colon: Secondary | ICD-10-CM | POA: Diagnosis not present

## 2021-01-28 DIAGNOSIS — K635 Polyp of colon: Secondary | ICD-10-CM | POA: Insufficient documentation

## 2021-01-28 DIAGNOSIS — Z79899 Other long term (current) drug therapy: Secondary | ICD-10-CM | POA: Insufficient documentation

## 2021-01-28 DIAGNOSIS — D124 Benign neoplasm of descending colon: Secondary | ICD-10-CM

## 2021-01-28 DIAGNOSIS — Z8673 Personal history of transient ischemic attack (TIA), and cerebral infarction without residual deficits: Secondary | ICD-10-CM | POA: Diagnosis not present

## 2021-01-28 DIAGNOSIS — K648 Other hemorrhoids: Secondary | ICD-10-CM | POA: Diagnosis not present

## 2021-01-28 DIAGNOSIS — Z1211 Encounter for screening for malignant neoplasm of colon: Secondary | ICD-10-CM | POA: Insufficient documentation

## 2021-01-28 DIAGNOSIS — Z7984 Long term (current) use of oral hypoglycemic drugs: Secondary | ICD-10-CM | POA: Insufficient documentation

## 2021-01-28 HISTORY — PX: COLONOSCOPY WITH PROPOFOL: SHX5780

## 2021-01-28 HISTORY — PX: POLYPECTOMY: SHX5525

## 2021-01-28 LAB — GLUCOSE, CAPILLARY: Glucose-Capillary: 95 mg/dL (ref 70–99)

## 2021-01-28 SURGERY — COLONOSCOPY WITH PROPOFOL
Anesthesia: General

## 2021-01-28 MED ORDER — PROPOFOL 10 MG/ML IV BOLUS
INTRAVENOUS | Status: DC | PRN
  Administered 2021-01-28: 75 ug/kg/min via INTRAVENOUS
  Administered 2021-01-28: 60 mg via INTRAVENOUS

## 2021-01-28 MED ORDER — LACTATED RINGERS IV SOLN: INTRAVENOUS | Status: DC

## 2021-01-28 NOTE — Discharge Instructions (Addendum)
  Colonoscopy Discharge Instructions  Read the instructions outlined below and refer to this sheet in the next few weeks. These discharge instructions provide you with general information on caring for yourself after you leave the hospital. Your doctor may also give you specific instructions. While your treatment has been planned according to the most current medical practices available, unavoidable complications occasionally occur.   ACTIVITY You may resume your regular activity, but move at a slower pace for the next 24 hours.  Take frequent rest periods for the next 24 hours.  Walking will help get rid of the air and reduce the bloated feeling in your belly (abdomen).  No driving for 24 hours (because of the medicine (anesthesia) used during the test).   Do not sign any important legal documents or operate any machinery for 24 hours (because of the anesthesia used during the test).  NUTRITION Drink plenty of fluids.  You may resume your normal diet as instructed by your doctor.  Begin with a light meal and progress to your normal diet. Heavy or fried foods are harder to digest and may make you feel sick to your stomach (nauseated).  Avoid alcoholic beverages for 24 hours or as instructed.  MEDICATIONS You may resume your normal medications unless your doctor tells you otherwise.  WHAT YOU CAN EXPECT TODAY Some feelings of bloating in the abdomen.  Passage of more gas than usual.  Spotting of blood in your stool or on the toilet paper.  IF YOU HAD POLYPS REMOVED DURING THE COLONOSCOPY: No aspirin products for 7 days or as instructed.  No alcohol for 7 days or as instructed.  Eat a soft diet for the next 24 hours.  FINDING OUT THE RESULTS OF YOUR TEST Not all test results are available during your visit. If your test results are not back during the visit, make an appointment with your caregiver to find out the results. Do not assume everything is normal if you have not heard from your  caregiver or the medical facility. It is important for you to follow up on all of your test results.  SEEK IMMEDIATE MEDICAL ATTENTION IF: You have more than a spotting of blood in your stool.  Your belly is swollen (abdominal distention).  You are nauseated or vomiting.  You have a temperature over 101.  You have abdominal pain or discomfort that is severe or gets worse throughout the day.   Your colonoscopy revealed 3 polyp(s) which I removed successfully. Await pathology results, my office will contact you. I recommend repeating colonoscopy in 5 years for surveillance purposes. You also have diverticulosis and internal hemorrhoids. I would recommend increasing fiber in your diet or adding OTC Benefiber/Metamucil. Be sure to drink at least 4 to 6 glasses of water daily. Follow-up with GI as needed.  Okay to resume coumadin today  I hope you have a great rest of your week!  Elon Alas. Abbey Chatters, D.O. Gastroenterology and Hepatology [facility]

## 2021-01-28 NOTE — Op Note (Signed)
[facility] Patient Name: Samantha Vega Procedure Date: 01/28/2021 8:25 AM MRN: [id number] Date of Birth: 1950-08-04 Attending MD: Elon Alas. Abbey Chatters DO CSN: [id number] Age: 70 Admit Type: Outpatient Procedure:                Colonoscopy Indications:              High risk colon cancer surveillance: Personal                            history of colonic polyps Providers:                Elon Alas. Abbey Chatters, DO, [place] Page, [place]                            Risa Grill, Technician, Aram Candela Referring MD:              Medicines:                See the Anesthesia note for documentation of the                            administered medications Complications:            No immediate complications. Estimated Blood Loss:     Estimated blood loss was minimal. Procedure:                Pre-Anesthesia Assessment:                           - The anesthesia plan was to use monitored                            anesthesia care (MAC).                           After obtaining informed consent, the colonoscope                            was passed under direct vision. Throughout the                            procedure, the patient's blood pressure, pulse, and                            oxygen saturations were monitored continuously. The                            PCF-HQ190L ([id number]) scope was introduced through                            the anus and advanced to the the cecum, identified                            by appendiceal orifice and ileocecal valve. The                            colonoscopy was performed without  difficulty. The                            patient tolerated the procedure well. The quality                            of the bowel preparation was evaluated using the                            BBPS [facility] Bowel Preparation Scale) with scores                            of: Right Colon = 3, Transverse Colon = 3 and Left                            Colon = 3  (entire mucosa seen well with no residual                            staining, small fragments of stool or opaque                            liquid). The total BBPS score equals 9. Scope In: 8:45:22 AM Scope Out: 9:00:04 AM Scope Withdrawal Time: 0 hours 10 minutes 40 seconds  Total Procedure Duration: 0 hours 14 minutes 42 seconds  Findings:      The perianal and digital rectal examinations were normal.      Non-bleeding internal hemorrhoids were found during retroflexion.      Multiple small and large-mouthed diverticula were found in the sigmoid       colon and descending colon.      A 5 mm polyp was found in the sigmoid colon. The polyp was sessile. The       polyp was removed with a cold snare. Resection and retrieval were       complete.      A 4 mm polyp was found in the descending colon. The polyp was sessile.       The polyp was removed with a cold snare. Resection and retrieval were       complete.      A 2 mm polyp was found in the descending colon. The polyp was sessile.       The polyp was removed with a cold biopsy forceps. Resection and       retrieval were complete. Impression:               - Non-bleeding internal hemorrhoids.                           - Diverticulosis in the sigmoid colon and in the                            descending colon.                           - One 5 mm polyp in the sigmoid colon, removed with  a cold snare. Resected and retrieved.                           - One 4 mm polyp in the descending colon, removed                            with a cold snare. Resected and retrieved.                           - One 2 mm polyp in the descending colon, removed                            with a cold biopsy forceps. Resected and retrieved. Moderate Sedation:      Per Anesthesia Care Recommendation:           - Patient has a contact number available for                            emergencies. The signs and symptoms of potential                             delayed complications were discussed with the                            patient. Return to normal activities tomorrow.                            Written discharge instructions were provided to the                            patient.                           - Resume previous diet.                           - Continue present medications.                           - Await pathology results.                           - Repeat colonoscopy in 5 years for surveillance.                           - Return to GI clinic PRN. Procedure Code(s):        --- Professional ---                           [phone], Colonoscopy, flexible; with removal of                            tumor(s), polyp(s), or other lesion(s) by snare  technique                           45380, 59, Colonoscopy, flexible; with biopsy,                            single or multiple Diagnosis Code(s):        --- Professional ---                           Z86.010, Personal history of colonic polyps                           K64.8, Other hemorrhoids                           K63.5, Polyp of colon                           K57.30, Diverticulosis of large intestine without                            perforation or abscess without bleeding CPT copyright 2019 American Medical Association. All rights reserved. The codes documented in this report are preliminary and upon coder review may  be revised to meet current compliance requirements. Elon Alas. Abbey Chatters, DO Steen Abbey Chatters, DO 01/28/2021 9:03:22 AM This report has been signed electronically. Number of Addenda: 0

## 2021-01-28 NOTE — Anesthesia Postprocedure Evaluation (Signed)
Anesthesia Post Note  Patient: Samantha Vega  Procedure(s) Performed: COLONOSCOPY WITH PROPOFOL POLYPECTOMY  Patient location during evaluation: PACU Anesthesia Type: General Level of consciousness: awake and alert and oriented Pain management: pain level controlled Vital Signs Assessment: post-procedure vital signs reviewed and stable Respiratory status: spontaneous breathing and respiratory function stable Cardiovascular status: blood pressure returned to baseline and stable Postop Assessment: no apparent nausea or vomiting Anesthetic complications: no   No notable events documented.   Last Vitals:  Vitals:   01/28/21 0810 01/28/21 0904  BP: 119/89 (!) 96/59  Resp: 16 (!) 24  Temp: 36.5 C 36.5 C  SpO2: 97% 97%    Last Pain:  Vitals:   01/28/21 0904  TempSrc: Oral  PainSc: 0-No pain                 Sedona Wenk C Ramesh Moan

## 2021-01-28 NOTE — Anesthesia Preprocedure Evaluation (Signed)
Anesthesia Evaluation  Patient identified by MRN, date of birth, ID band Patient awake    Reviewed: Allergy & Precautions, NPO status , Patient's Chart, lab work & pertinent test results, reviewed documented beta blocker date and time   History of Anesthesia Complications (+) history of anesthetic complications  Airway Mallampati: II  TM Distance: >3 FB Neck ROM: Full    Dental  (+) Edentulous Upper, Edentulous Lower   Pulmonary           Cardiovascular Exercise Tolerance: Good hypertension, Pt. on medications and Pt. on home beta blockers + dysrhythmias Atrial Fibrillation  Rhythm:Irregular Rate:Normal     Neuro/Psych TIA   GI/Hepatic negative GI ROS, Neg liver ROS,   Endo/Other  diabetes, Well Controlled, Type 2, Oral Hypoglycemic Agents  Renal/GU      Musculoskeletal  (+) Arthritis ,   Abdominal   Peds  Hematology   Anesthesia Other Findings   Reproductive/Obstetrics                             Anesthesia Physical Anesthesia Plan  ASA: 3  Anesthesia Plan: General   Post-op Pain Management:    Induction: Intravenous  PONV Risk Score and Plan: Propofol infusion  Airway Management Planned: Nasal Cannula and Natural Airway  Additional Equipment:   Intra-op Plan:   Post-operative Plan:   Informed Consent: I have reviewed the patients History and Physical, chart, labs and discussed the procedure including the risks, benefits and alternatives for the proposed anesthesia with the patient or authorized representative who has indicated his/her understanding and acceptance.       Plan Discussed with: CRNA and Surgeon  Anesthesia Plan Comments:         Anesthesia Quick Evaluation

## 2021-01-28 NOTE — Transfer of Care (Signed)
Immediate Anesthesia Transfer of Care Note  Patient: Samantha Vega  Procedure(s) Performed: COLONOSCOPY WITH PROPOFOL POLYPECTOMY  Patient Location: Short Stay  Anesthesia Type:General  Level of Consciousness: awake, alert , oriented and patient cooperative  Airway & Oxygen Therapy: Patient Spontanous Breathing  Post-op Assessment: Report given to RN, Post -op Vital signs reviewed and stable and Patient moving all extremities X 4  Post vital signs: Reviewed and stable  Last Vitals:  Vitals Value Taken Time  BP    Temp    Pulse    Resp    SpO2      Last Pain:  Vitals:   01/28/21 0843  TempSrc:   PainSc: 0-No pain         Complications: No notable events documented.

## 2021-01-28 NOTE — H&P (Signed)
Primary Care Physician:  Fayrene Helper, MD Primary Gastroenterologist:  Dr. Abbey Chatters  Pre-Procedure History & Physical: HPI:  Samantha Vega is a 70 y.o. female is here for a colonoscopy for surveillance purposes.  Last colonoscopy 2017 with multiple polyps removed, 1 of which was a 7 mm tubular adenoma in the cecum.  Patient denies any family history of colorectal malignancy.  No melena hematochezia.  No abdominal pain.  No change in bowel habits.  No unintentional weight loss.  Does chronically take Coumadin for atrial fibrillation.  She denies any history of a  stroke but does have TIA listed on her past medical history from 2020.   Past Medical History:  Diagnosis Date   Annual physical exam 06/07/2015   Arthritis    Baker's cyst of knee 10/04/2013   Chest pain    Noncardiac; evaluated and treated in 2008   Diabetes mellitus    Dyspnea on exertion 10/14/2019   Enlarged heart    Hyperlipidemia    Hypertension    Metabolic syndrome X [id number]   OA (osteoarthritis) of knee 05/05/2012   Left, takes tramadol on avg twice per week   Obesity    PAF (paroxysmal atrial fibrillation) (HCC)    Right bundle branch block    +Palpitations.   EKG 11/2006:  NSR, RBBB, LAFB, markedly delayed R-wave progression, no change; Echocardiogram in 10/2006-suboptimal quality, normal; Event recorder-PVCs, no significant arrhythmias or symptoms ;   TIA (transient ischemic attack) 06/02/2019   Unspecified visual loss 12/08/2008    Past Surgical History:  Procedure Laterality Date   ABDOMINAL HYSTERECTOMY     BREAST BIOPSY Left    benign   CATARACT EXTRACTION W/PHACO Right 01/08/2016   Procedure: CATARACT EXTRACTION PHACO AND INTRAOCULAR LENS PLACEMENT ([place]);  Surgeon: Rutherford Guys, MD;  Location: [facility];  Service: Ophthalmology;  Laterality: Right;  CDE: 5.78   CATARACT EXTRACTION W/PHACO Left 02/05/2016   Procedure: CATARACT EXTRACTION PHACO AND INTRAOCULAR LENS PLACEMENT LEFT EYE CDE=6.50;  Surgeon:  Rutherford Guys, MD;  Location: [facility];  Service: Ophthalmology;  Laterality: Left;   COLONOSCOPY  2006   Dr. Tamala Julian: Normal, repeat in 2009.   COLONOSCOPY N/A 10/08/2015   Procedure: COLONOSCOPY;  Surgeon: Danie Binder, MD;  Location: [facility];  Service: Endoscopy;  Laterality: N/A;  230 - moved to 1:30, spoke with pt    POLYPECTOMY  10/08/2015   Procedure: POLYPECTOMY;  Surgeon: Danie Binder, MD;  Location: [facility];  Service: Endoscopy;;  cecal polyp removed via cold forceps with one clip placed/ rectal polyp removed via cold forcep   SHOULDER SURGERY  2006   Left-post-trauma   TOTAL ABDOMINAL HYSTERECTOMY W/ BILATERAL SALPINGOOPHORECTOMY      Prior to Admission medications   Medication Sig Start Date End Date Taking? Authorizing Provider  atorvastatin (LIPITOR) 80 MG tablet Take 1 tablet (80 mg total) by mouth daily. 08/15/20  Yes Fayrene Helper, MD  benazepril (LOTENSIN) 20 MG tablet TAKE 1 TABLET BY MOUTH EVERY DAY 12/10/20  Yes Fayrene Helper, MD  diltiazem (CARDIZEM CD) 120 MG 24 hr capsule TAKE 1 CAPSULE BY MOUTH EVERY DAY 12/18/20  Yes Fayrene Helper, MD  furosemide (LASIX) 20 MG tablet TAKE 1 TABLET (20 MG TOTAL) BY MOUTH DAILY AS NEEDED FOR FLUID. 08/16/20  Yes Arnoldo Lenis, MD  metFORMIN (GLUCOPHAGE-XR) 500 MG 24 hr tablet TAKE 1 TABLET BY MOUTH EVERY DAY WITH BREAKFAST 01/24/21  Yes Noreene Larsson, NP  metoprolol  tartrate (LOPRESSOR) 100 MG tablet TAKE 1 TABLET BY MOUTH TWICE A DAY 11/13/20  Yes Fayrene Helper, MD  polyethylene glycol-electrolytes (TRILYTE) 420 g solution Take 4,000 mLs by mouth as directed. 01/01/21  Yes Eloise Harman, DO  warfarin (COUMADIN) 5 MG tablet TAKE 1 TABLET DAILY EXCEPT 1/2 TABLET ON SUNDAY AND THURSDAY OR AS DIRECTED BY COUMADIN CLINIC 10/01/20  Yes Fayrene Helper, MD    Allergies as of 01/01/2021   (No Known Allergies)    Family History  Problem Relation Age of Onset   Hypertension Mother    Arthritis Mother     Stroke Father    Hypertension Father    Coronary artery disease Father    Diabetes Sister    Sarcoidosis Sister    Diabetes Sister    Heart failure Brother    Congestive Heart Failure Brother    Colon cancer Neg Hx     Social History   Socioeconomic History   Marital status: Married    Spouse name: Not on file   Number of children: 0   Years of education: Not on file   Highest education level: Not on file  Occupational History   Occupation: Museum/gallery exhibitions officer: UNEMPLOYED  Tobacco Use   Smoking status: Never   Smokeless tobacco: Never  Vaping Use   Vaping Use: Never used  Substance and Sexual Activity   Alcohol use: No   Drug use: No   Sexual activity: Yes    Birth control/protection: Surgical  Other Topics Concern   Not on file  Social History Narrative   Not on file   Social Determinants of Health   Financial Resource Strain: Low Risk    Difficulty of Paying Living Expenses: Not hard at all  Food Insecurity: No Food Insecurity   Worried About Charity fundraiser in the Last Year: Never true   [place] in the Last Year: Never true  Transportation Needs: No Transportation Needs   Lack of Transportation (Medical): No   Lack of Transportation (Non-Medical): No  Physical Activity: Insufficiently Active   Days of Exercise per Week: 3 days   Minutes of Exercise per Session: 20 min  Stress: No Stress Concern Present   Feeling of Stress : Not at all  Social Connections: Moderately Integrated   Frequency of Communication with Friends and Family: More than three times a week   Frequency of Social Gatherings with Friends and Family: Twice a week   Attends Religious Services: More than 4 times per year   Active Member of [employer] or Organizations: No   Attends Music therapist: Never   Marital Status: Married  Human resources officer Violence: Not At Risk   Fear of Current or Ex-Partner: No   Emotionally Abused: No   Physically Abused: No    Sexually Abused: No    Review of Systems: See HPI, otherwise negative ROS  Physical Exam: Vital signs in last 24 hours: Temp:  [97.7 F (36.5 C)] 97.7 F (36.5 C) (08/15 0810) Resp:  [16] 16 (08/15 0810) BP: (119)/(89) 119/89 (08/15 0810) SpO2:  [97 %] 97 % (08/15 0810)   General:   Alert,  Well-developed, well-nourished, pleasant and cooperative in NAD Head:  Normocephalic and atraumatic. Eyes:  Sclera clear, no icterus.   Conjunctiva pink. Ears:  Normal auditory acuity. Nose:  No deformity, discharge,  or lesions. Mouth:  No deformity or lesions, dentition normal. Neck:  Supple; no masses or thyromegaly.  Lungs:  Clear throughout to auscultation.   No wheezes, crackles, or rhonchi. No acute distress. Heart:  Regular rate and rhythm; no murmurs, clicks, rubs,  or gallops. Abdomen:  Soft, nontender and nondistended. No masses, hepatosplenomegaly or hernias noted. Normal bowel sounds, without guarding, and without rebound.   Msk:  Symmetrical without gross deformities. Normal posture. Extremities:  Without clubbing or edema. Neurologic:  Alert and  oriented x4;  grossly normal neurologically. Skin:  Intact without significant lesions or rashes. Cervical Nodes:  No significant cervical adenopathy. Psych:  Alert and cooperative. Normal mood and affect.  Impression/Plan: DOSIA LEINWEBER is here for a colonoscopy for surveillance purposes.  Last colonoscopy 2017 with multiple polyps removed, 1 of which was a 7 mm tubular adenoma in the cecum. s.  The risks of the procedure including infection, bleed, or perforation as well as benefits, limitations, alternatives and imponderables have been reviewed with the patient. Questions have been answered. All parties agreeable.

## 2021-01-29 LAB — SURGICAL PATHOLOGY

## 2021-02-04 ENCOUNTER — Encounter (HOSPITAL_COMMUNITY): Payer: Self-pay | Admitting: Internal Medicine

## 2021-02-05 ENCOUNTER — Ambulatory Visit (INDEPENDENT_AMBULATORY_CARE_PROVIDER_SITE_OTHER): Payer: Medicare Other | Admitting: *Deleted

## 2021-02-05 DIAGNOSIS — I480 Paroxysmal atrial fibrillation: Secondary | ICD-10-CM

## 2021-02-05 DIAGNOSIS — Z5181 Encounter for therapeutic drug level monitoring: Secondary | ICD-10-CM

## 2021-02-05 LAB — POCT INR: INR: 1.9 — AB (ref 2.0–3.0)

## 2021-02-05 NOTE — Patient Instructions (Signed)
Take warfarin 1 tablet tonight then resume 1 tablet daily except 1/2 tablet on Tuesdays and Saturdays Recheck in 3 wks

## 2021-02-07 ENCOUNTER — Ambulatory Visit: Payer: Medicare Other

## 2021-02-07 ENCOUNTER — Other Ambulatory Visit: Payer: Self-pay

## 2021-02-18 ENCOUNTER — Other Ambulatory Visit: Payer: Self-pay | Admitting: Family Medicine

## 2021-02-26 ENCOUNTER — Ambulatory Visit (INDEPENDENT_AMBULATORY_CARE_PROVIDER_SITE_OTHER): Payer: Medicare Other | Admitting: *Deleted

## 2021-02-26 DIAGNOSIS — Z5181 Encounter for therapeutic drug level monitoring: Secondary | ICD-10-CM | POA: Diagnosis not present

## 2021-02-26 DIAGNOSIS — I480 Paroxysmal atrial fibrillation: Secondary | ICD-10-CM

## 2021-02-26 LAB — POCT INR: INR: 2 (ref 2.0–3.0)

## 2021-02-26 NOTE — Patient Instructions (Signed)
Increase warfarin to 1 tablet daily except 1/2 tablet on Saturdays Recheck in 4 wks

## 2021-03-08 ENCOUNTER — Other Ambulatory Visit: Payer: Self-pay

## 2021-03-08 ENCOUNTER — Ambulatory Visit (INDEPENDENT_AMBULATORY_CARE_PROVIDER_SITE_OTHER): Payer: Medicare Other | Admitting: Family Medicine

## 2021-03-08 ENCOUNTER — Encounter: Payer: Self-pay | Admitting: Family Medicine

## 2021-03-08 VITALS — BP 129/72 | HR 85 | Temp 98.0°F | Resp 18 | Ht 62.0 in | Wt 206.0 lb

## 2021-03-08 DIAGNOSIS — E559 Vitamin D deficiency, unspecified: Secondary | ICD-10-CM

## 2021-03-08 DIAGNOSIS — E785 Hyperlipidemia, unspecified: Secondary | ICD-10-CM

## 2021-03-08 DIAGNOSIS — I10 Essential (primary) hypertension: Secondary | ICD-10-CM | POA: Diagnosis not present

## 2021-03-08 DIAGNOSIS — E1159 Type 2 diabetes mellitus with other circulatory complications: Secondary | ICD-10-CM

## 2021-03-08 DIAGNOSIS — R079 Chest pain, unspecified: Secondary | ICD-10-CM

## 2021-03-08 DIAGNOSIS — Z23 Encounter for immunization: Secondary | ICD-10-CM

## 2021-03-08 NOTE — Assessment & Plan Note (Signed)
2 month h/o increased exertional fatigue ands chest pain with movement. eKG  Shows RBB and possible lateral infarct, refer card

## 2021-03-08 NOTE — Patient Instructions (Addendum)
F/U as before, call if you need me sooner  Please get lipid panel, cmp and EGFr, Vit D fasting as soon as possible  Blood sugar is well controlled, no change in medication needed  You are referred to Cardiology re concerns regarding new chest PAIN and tiring easily. You need to go to the ED if your symptoms persist or worsen  Flu vaccine today  Please get shingles vaccines at your pharmacy  Thanks for choosing [facility], we consider it a privelige to serve you.

## 2021-03-11 ENCOUNTER — Encounter: Payer: Self-pay | Admitting: Family Medicine

## 2021-03-11 DIAGNOSIS — E785 Hyperlipidemia, unspecified: Secondary | ICD-10-CM | POA: Diagnosis not present

## 2021-03-11 DIAGNOSIS — E559 Vitamin D deficiency, unspecified: Secondary | ICD-10-CM | POA: Diagnosis not present

## 2021-03-11 DIAGNOSIS — I10 Essential (primary) hypertension: Secondary | ICD-10-CM | POA: Diagnosis not present

## 2021-03-11 NOTE — Assessment & Plan Note (Signed)
Hyperlipidemia:Low fat diet discussed and encouraged.   Lipid Panel  Lab Results  Component Value Date   CHOL 153 07/24/2020   HDL 38 (L) 07/24/2020   LDLCALC 100 (H) 07/24/2020   TRIG 76 07/24/2020   CHOLHDL 4.6 (H) 02/22/2020   Need to reduce fat intake Updated lab needed at/ before next visit.

## 2021-03-11 NOTE — Assessment & Plan Note (Signed)
Controlled, no change in medication DASH diet and commitment to daily physical activity for a minimum of 30 minutes discussed and encouraged, as a part of hypertension management. The importance of attaining a healthy weight is also discussed.  BP/Weight 03/08/2021 01/28/2021 01/23/2021 01/21/2021 01/09/2021 12/27/2020 12/02/120  Systolic BP 241 96 146 431 427 670 110  Diastolic BP 72 59 89 70 80 69 71  Wt. (Lbs) 206 - 203 203 200 200.4 200.2  BMI 37.68 - 37.13 37.13 34.33 34.4 36.62

## 2021-03-11 NOTE — Assessment & Plan Note (Signed)
Samantha Vega is reminded of the importance of commitment to daily physical activity for 30 minutes or more, as able and the need to limit carbohydrate intake to 30 to 60 grams per meal to help with blood sugar control.   The need to take medication as prescribed, test blood sugar as directed, and to call between visits if there is a concern that blood sugar is uncontrolled is also discussed.   Samantha Vega is reminded of the importance of daily foot exam, annual eye examination, and good blood sugar, blood pressure and cholesterol control. Controlled, no change in medication   Diabetic Labs Latest Ref Rng & Units 01/23/2021 09/17/2020 07/24/2020 03/15/2020 02/22/2020  HbA1c 4.8 - 5.6 % - - 6.6(H) - -  Microalbumin Not Estab. ug/mL - - - - -  Micro/Creat Ratio 0 - 29 mg/g creat - - 7 - -  Chol 100 - 199 mg/dL - - 153 - 166  HDL >39 mg/dL - - 38(L) - 36(L)  Calc LDL 0 - 99 mg/dL - - 100(H) - 109(H)  Triglycerides 0 - 149 mg/dL - - 76 - 112  Creatinine 0.44 - 1.00 mg/dL 0.83 0.96 0.81 1.47(H) 0.87   BP/Weight 03/08/2021 01/28/2021 01/23/2021 01/21/2021 01/09/2021 12/27/2020 01/21/8109  Systolic BP 315 96 945 859 292 446 286  Diastolic BP 72 59 89 70 80 69 71  Wt. (Lbs) 206 - 203 203 200 200.4 200.2  BMI 37.68 - 37.13 37.13 34.33 34.4 36.62   Foot/eye exam completion dates Latest Ref Rng & Units 11/05/2020 01/02/2020  Eye Exam No Retinopathy - No Retinopathy  Foot Form Completion - Done -

## 2021-03-11 NOTE — Progress Notes (Signed)
Samantha Vega     MRN: [id number]      DOB: 1951/04/19   HPI Samantha Vega is here with a 2 month h/o increased exertional fatigue and nchest pain with activity. She denies PND, orthopnea or palpitations or leg edema. Had recent  cardiology eval with 1 year f/u but states symptoms started after this  ROS Denies recent fever or chills. Denies sinus pressure, nasal congestion, ear pain or sore throat. Denies chest congestion, productive cough or wheezing.  Denies abdominal pain, nausea, vomiting,diarrhea or constipation.   Denies dysuria, frequency, hesitancy or incontinence. Denies joint pain, swelling and limitation in mobility. Denies headaches, seizures, numbness, or tingling. Denies depression, anxiety or insomnia. Denies skin break down or rash.   PE  BP 129/72 (BP Location: Right Arm, Patient Position: Sitting, Cuff Size: Large)   Pulse 85   Temp 98 F (36.7 C)   Resp 18   Ht 5\' 2"  (1.575 m)   Wt 206 lb (93.4 kg)   SpO2 98%   BMI 37.68 kg/m   Patient alert and oriented and in no cardiopulmonary distress.  HEENT: No facial asymmetry, EOMI,     Neck supple .  Chest: Clear to auscultation bilaterally.  CVS: S1, S2 no murmurs, no S3.Regular rate. EKG: RBB and possible lateral infarct  ABD: Soft non tender.   Ext: No edema  MS: decreased  ROM spine, shoulders, hips and knees.  Skin: Intact, no ulcerations or rash noted.  Psych: Good eye contact, normal affect. Memory intact not anxious or depressed appearing.  CNS: CN 2-12 intact, power,  normal throughout.no focal deficits noted.   Assessment & Plan  Chest pain 2 month h/o increased exertional fatigue ands chest pain with movement. eKG  Shows RBB and possible lateral infarct, refer card  Essential hypertension Controlled, no change in medication DASH diet and commitment to daily physical activity for a minimum of 30 minutes discussed and encouraged, as a part of hypertension management. The  importance of attaining a healthy weight is also discussed.  BP/Weight 03/08/2021 01/28/2021 01/23/2021 01/21/2021 01/09/2021 12/27/2020 1/60/1093  Systolic BP 235 96 573 220 254 270 623  Diastolic BP 72 59 89 70 80 69 71  Wt. (Lbs) 206 - 203 203 200 200.4 200.2  BMI 37.68 - 37.13 37.13 34.33 34.4 36.62       Morbid obesity (HCC)  Patient re-educated about  the importance of commitment to a  minimum of 150 minutes of exercise per week as able.  The importance of healthy food choices with portion control discussed, as well as eating regularly and within a 12 hour window most days. The need to choose "clean , green" food 50 to 75% of the time is discussed, as well as to make water the primary drink and set a goal of 64 ounces water daily.    Weight /BMI 03/08/2021 01/23/2021 01/21/2021  WEIGHT 206 lb 203 lb 203 lb  HEIGHT 5\' 2"  5\' 2"  5\' 2"   BMI 37.68 kg/m2 37.13 kg/m2 37.13 kg/m2      Type 2 diabetes mellitus with vascular disease ([place]) Samantha Vega is reminded of the importance of commitment to daily physical activity for 30 minutes or more, as able and the need to limit carbohydrate intake to 30 to 60 grams per meal to help with blood sugar control.   The need to take medication as prescribed, test blood sugar as directed, and to call between visits if there is a concern that blood sugar is  uncontrolled is also discussed.   Samantha Vega is reminded of the importance of daily foot exam, annual eye examination, and good blood sugar, blood pressure and cholesterol control. Controlled, no change in medication   Diabetic Labs Latest Ref Rng & Units 01/23/2021 09/17/2020 07/24/2020 03/15/2020 02/22/2020  HbA1c 4.8 - 5.6 % - - 6.6(H) - -  Microalbumin Not Estab. ug/mL - - - - -  Micro/Creat Ratio 0 - 29 mg/g creat - - 7 - -  Chol 100 - 199 mg/dL - - 153 - 166  HDL >39 mg/dL - - 38(L) - 36(L)  Calc LDL 0 - 99 mg/dL - - 100(H) - 109(H)  Triglycerides 0 - 149 mg/dL - - 76 - 112  Creatinine 0.44 - 1.00  mg/dL 0.83 0.96 0.81 1.47(H) 0.87   BP/Weight 03/08/2021 01/28/2021 01/23/2021 01/21/2021 01/09/2021 12/27/2020 3/76/2831  Systolic BP 517 96 616 073 710 626 948  Diastolic BP 72 59 89 70 80 69 71  Wt. (Lbs) 206 - 203 203 200 200.4 200.2  BMI 37.68 - 37.13 37.13 34.33 34.4 36.62   Foot/eye exam completion dates Latest Ref Rng & Units 11/05/2020 01/02/2020  Eye Exam No Retinopathy - No Retinopathy  Foot Form Completion - Done -        Hyperlipidemia LDL goal <70 Hyperlipidemia:Low fat diet discussed and encouraged.   Lipid Panel  Lab Results  Component Value Date   CHOL 153 07/24/2020   HDL 38 (L) 07/24/2020   LDLCALC 100 (H) 07/24/2020   TRIG 76 07/24/2020   CHOLHDL 4.6 (H) 02/22/2020   Need to reduce fat intake Updated lab needed at/ before next visit.

## 2021-03-11 NOTE — Assessment & Plan Note (Signed)
  Patient re-educated about  the importance of commitment to a  minimum of 150 minutes of exercise per week as able.  The importance of healthy food choices with portion control discussed, as well as eating regularly and within a 12 hour window most days. The need to choose "clean , green" food 50 to 75% of the time is discussed, as well as to make water the primary drink and set a goal of 64 ounces water daily.    Weight /BMI 03/08/2021 01/23/2021 01/21/2021  WEIGHT 206 lb 203 lb 203 lb  HEIGHT 5\' 2"  5\' 2"  5\' 2"   BMI 37.68 kg/m2 37.13 kg/m2 37.13 kg/m2

## 2021-03-12 LAB — CMP14+EGFR
ALT: 29 IU/L (ref 0–32)
AST: 26 IU/L (ref 0–40)
Albumin/Globulin Ratio: 1.3 (ref 1.2–2.2)
Albumin: 3.8 g/dL (ref 3.7–4.7)
Alkaline Phosphatase: 139 IU/L — ABNORMAL HIGH (ref 44–121)
BUN/Creatinine Ratio: 18 (ref 12–28)
BUN: 14 mg/dL (ref 8–27)
Bilirubin Total: 0.2 mg/dL (ref 0.0–1.2)
CO2: 23 mmol/L (ref 20–29)
Calcium: 9 mg/dL (ref 8.7–10.3)
Chloride: 104 mmol/L (ref 96–106)
Creatinine, Ser: 0.8 mg/dL (ref 0.57–1.00)
Globulin, Total: 2.9 g/dL (ref 1.5–4.5)
Glucose: 115 mg/dL — ABNORMAL HIGH (ref 70–99)
Potassium: 3.8 mmol/L (ref 3.5–5.2)
Sodium: 139 mmol/L (ref 134–144)
Total Protein: 6.7 g/dL (ref 6.0–8.5)
eGFR: 78 mL/min/{1.73_m2} (ref >59)

## 2021-03-12 LAB — LIPID PANEL
Chol/HDL Ratio: 3.9 ratio (ref 0.0–4.4)
Cholesterol, Total: 145 mg/dL (ref 100–199)
HDL: 37 mg/dL — ABNORMAL LOW (ref >39)
LDL Chol Calc (NIH): 91 mg/dL (ref 0–99)
Triglycerides: 89 mg/dL (ref 0–149)
VLDL Cholesterol Cal: 17 mg/dL (ref 5–40)

## 2021-03-12 LAB — VITAMIN D 25 HYDROXY (VIT D DEFICIENCY, FRACTURES): Vit D, 25-Hydroxy: 26.9 ng/mL — ABNORMAL LOW (ref 30.0–100.0)

## 2021-03-15 ENCOUNTER — Telehealth: Payer: Self-pay | Admitting: Family Medicine

## 2021-03-15 NOTE — Telephone Encounter (Signed)
Message stating sherry spoke with pt about her results

## 2021-03-15 NOTE — Telephone Encounter (Signed)
Patient called to Return a Nurse call about her Vitamin  D

## 2021-03-19 ENCOUNTER — Ambulatory Visit (INDEPENDENT_AMBULATORY_CARE_PROVIDER_SITE_OTHER): Payer: Medicare Other | Admitting: Family Medicine

## 2021-03-19 ENCOUNTER — Encounter: Payer: Self-pay | Admitting: Family Medicine

## 2021-03-19 ENCOUNTER — Emergency Department (HOSPITAL_COMMUNITY): Payer: Medicare Other

## 2021-03-19 ENCOUNTER — Other Ambulatory Visit: Payer: Self-pay

## 2021-03-19 ENCOUNTER — Encounter (HOSPITAL_COMMUNITY): Payer: Self-pay

## 2021-03-19 ENCOUNTER — Emergency Department (HOSPITAL_COMMUNITY)
Admission: EM | Admit: 2021-03-19 | Discharge: 2021-03-19 | Disposition: A | Discharge status: Home / Self Care | Payer: Medicare Other | Attending: Emergency Medicine | Admitting: Emergency Medicine

## 2021-03-19 DIAGNOSIS — E1169 Type 2 diabetes mellitus with other specified complication: Secondary | ICD-10-CM | POA: Diagnosis not present

## 2021-03-19 DIAGNOSIS — I517 Cardiomegaly: Secondary | ICD-10-CM | POA: Diagnosis not present

## 2021-03-19 DIAGNOSIS — Z7984 Long term (current) use of oral hypoglycemic drugs: Secondary | ICD-10-CM | POA: Insufficient documentation

## 2021-03-19 DIAGNOSIS — Z7409 Other reduced mobility: Secondary | ICD-10-CM

## 2021-03-19 DIAGNOSIS — E785 Hyperlipidemia, unspecified: Secondary | ICD-10-CM | POA: Diagnosis not present

## 2021-03-19 DIAGNOSIS — Z79899 Other long term (current) drug therapy: Secondary | ICD-10-CM | POA: Insufficient documentation

## 2021-03-19 DIAGNOSIS — Z7901 Long term (current) use of anticoagulants: Secondary | ICD-10-CM | POA: Insufficient documentation

## 2021-03-19 DIAGNOSIS — Z20822 Contact with and (suspected) exposure to covid-19: Secondary | ICD-10-CM | POA: Insufficient documentation

## 2021-03-19 DIAGNOSIS — R52 Pain, unspecified: Secondary | ICD-10-CM

## 2021-03-19 DIAGNOSIS — R0682 Tachypnea, not elsewhere classified: Secondary | ICD-10-CM | POA: Diagnosis not present

## 2021-03-19 DIAGNOSIS — I10 Essential (primary) hypertension: Secondary | ICD-10-CM | POA: Insufficient documentation

## 2021-03-19 DIAGNOSIS — R531 Weakness: Secondary | ICD-10-CM | POA: Diagnosis not present

## 2021-03-19 DIAGNOSIS — R29818 Other symptoms and signs involving the nervous system: Secondary | ICD-10-CM | POA: Diagnosis not present

## 2021-03-19 DIAGNOSIS — I4891 Unspecified atrial fibrillation: Secondary | ICD-10-CM | POA: Insufficient documentation

## 2021-03-19 LAB — COMPREHENSIVE METABOLIC PANEL
ALT: 35 U/L (ref 0–44)
AST: 33 U/L (ref 15–41)
Albumin: 4.1 g/dL (ref 3.5–5.0)
Alkaline Phosphatase: 123 U/L (ref 38–126)
Anion gap: 6 (ref 5–15)
BUN: 23 mg/dL (ref 8–23)
CO2: 28 mmol/L (ref 22–32)
Calcium: 9.1 mg/dL (ref 8.9–10.3)
Chloride: 105 mmol/L (ref 98–111)
Creatinine, Ser: 0.89 mg/dL (ref 0.44–1.00)
GFR, Estimated: >60 mL/min (ref >60)
Glucose, Bld: 100 mg/dL — ABNORMAL HIGH (ref 70–99)
Potassium: 4 mmol/L (ref 3.5–5.1)
Sodium: 139 mmol/L (ref 135–145)
Total Bilirubin: 0.6 mg/dL (ref 0.3–1.2)
Total Protein: 8.2 g/dL — ABNORMAL HIGH (ref 6.5–8.1)

## 2021-03-19 LAB — URINALYSIS, ROUTINE W REFLEX MICROSCOPIC
Bacteria, UA: NONE SEEN
Bilirubin Urine: NEGATIVE
Glucose, UA: NEGATIVE mg/dL
Ketones, ur: NEGATIVE mg/dL
Leukocytes,Ua: NEGATIVE
Nitrite: NEGATIVE
Protein, ur: NEGATIVE mg/dL
Specific Gravity, Urine: 1.02 (ref 1.005–1.030)
pH: 5 (ref 5.0–8.0)

## 2021-03-19 LAB — CBC WITH DIFFERENTIAL/PLATELET
Abs Immature Granulocytes: 0.01 10*3/uL (ref 0.00–0.07)
Basophils Absolute: 0 10*3/uL (ref 0.0–0.1)
Basophils Relative: 1 %
Eosinophils Absolute: 0.2 10*3/uL (ref 0.0–0.5)
Eosinophils Relative: 3 %
HCT: 41.9 % (ref 36.0–46.0)
Hemoglobin: 13.4 g/dL (ref 12.0–15.0)
Immature Granulocytes: 0 %
Lymphocytes Relative: 36 %
Lymphs Abs: 2.2 10*3/uL (ref 0.7–4.0)
MCH: 28.9 pg (ref 26.0–34.0)
MCHC: 32 g/dL (ref 30.0–36.0)
MCV: 90.5 fL (ref 80.0–100.0)
Monocytes Absolute: 0.5 10*3/uL (ref 0.1–1.0)
Monocytes Relative: 8 %
Neutro Abs: 3.1 10*3/uL (ref 1.7–7.7)
Neutrophils Relative %: 52 %
Platelets: 218 10*3/uL (ref 150–400)
RBC: 4.63 MIL/uL (ref 3.87–5.11)
RDW: 15.8 % — ABNORMAL HIGH (ref 11.5–15.5)
WBC: 6 10*3/uL (ref 4.0–10.5)
nRBC: 0 % (ref 0.0–0.2)

## 2021-03-19 LAB — TROPONIN I (HIGH SENSITIVITY)
Troponin I (High Sensitivity): 5 ng/L (ref <18)
Troponin I (High Sensitivity): 4 ng/L (ref <18)

## 2021-03-19 LAB — RESP PANEL BY RT-PCR (FLU A&B, COVID) ARPGX2
Influenza A by PCR: NEGATIVE
Influenza B by PCR: NEGATIVE
SARS Coronavirus 2 by RT PCR: NEGATIVE

## 2021-03-19 LAB — PROTIME-INR
INR: 2.4 — ABNORMAL HIGH (ref 0.8–1.2)
Prothrombin Time: 26.4 seconds — ABNORMAL HIGH (ref 11.4–15.2)

## 2021-03-19 MED ORDER — IOHEXOL 350 MG/ML SOLN
100.0000 mL | Freq: Once | INTRAVENOUS | Status: AC | PRN
  Administered 2021-03-19: 80 mL via INTRAVENOUS

## 2021-03-19 NOTE — ED Notes (Signed)
Called patients husband to come and get patient. She is discharged.

## 2021-03-19 NOTE — Patient Instructions (Signed)
You need to go to the ED for evaluation and management of severe acute generalized pain limiting mobility And chest pain  I will contact the Physician to let them know you are on the way

## 2021-03-19 NOTE — ED Provider Notes (Signed)
[facility] EMERGENCY DEPARTMENT Provider Note   CSN: [id number] Arrival date & time: 03/19/21  1343     History Chief Complaint  Patient presents with   Leg Pain    Samantha Vega is a 70 y.o. female who presents with her husband at bedside from her primary care office for concern of central chest pressure that does not radiate but is exertional in nature with associated shortness of breath, as well as a "stiffness" to all 4 of her extremities.  She describes the stiffness as discomfort with movement.  She denies any confusion, slurred speech, or visual changes.  Last known well was yesterday evening around 7 PM.  I personally read this patient's medical records.  She is history of hypertension, hyperlipidemia, paroxysmal A. fib on warfarin, and type 2 diabetes.  She endorses compliance with all of her medications.  HPI     Past Medical History:  Diagnosis Date   Annual physical exam 06/07/2015   Arthritis    Baker's cyst of knee 10/04/2013   Chest pain    Noncardiac; evaluated and treated in 2008   Diabetes mellitus    Dyspnea on exertion 10/14/2019   Enlarged heart    Hyperlipidemia    Hypertension    Metabolic syndrome X 01/31/5630   OA (osteoarthritis) of knee 05/05/2012   Left, takes tramadol on avg twice per week   Obesity    PAF (paroxysmal atrial fibrillation) (HCC)    Right bundle branch block    +Palpitations.   EKG 11/2006:  NSR, RBBB, LAFB, markedly delayed R-wave progression, no change; Echocardiogram in 10/2006-suboptimal quality, normal; Event recorder-PVCs, no significant arrhythmias or symptoms ;   TIA (transient ischemic attack) 06/02/2019   Unspecified visual loss 12/08/2008    Patient Active Problem List   Diagnosis Date Noted   Generalized pain 03/19/2021   Immobility 03/19/2021   Knee pain, right 11/15/2020   Left arm swelling 11/05/2020   Type 2 diabetes mellitus with vascular disease ([place]) 08/10/2019   Thyroid goiter 06/16/2019   Tubular  adenoma of colon 10/09/2015   Chronic anticoagulation 09/16/2012   Paroxysmal atrial fibrillation ([place]) 05/22/2012   RBBB (right bundle branch block)    Morbid obesity ([place])    Chest pain    Essential hypertension 12/08/2008   Hyperlipidemia LDL goal <70 12/06/2007    Past Surgical History:  Procedure Laterality Date   ABDOMINAL HYSTERECTOMY     BREAST BIOPSY Left    benign   CATARACT EXTRACTION W/PHACO Right 01/08/2016   Procedure: CATARACT EXTRACTION PHACO AND INTRAOCULAR LENS PLACEMENT ([place]);  Surgeon: Rutherford Guys, MD;  Location: [facility] ORS;  Service: Ophthalmology;  Laterality: Right;  CDE: 5.78   CATARACT EXTRACTION W/PHACO Left 02/05/2016   Procedure: CATARACT EXTRACTION PHACO AND INTRAOCULAR LENS PLACEMENT LEFT EYE CDE=6.50;  Surgeon: Rutherford Guys, MD;  Location: [facility];  Service: Ophthalmology;  Laterality: Left;   COLONOSCOPY  2006   Dr. Tamala Julian: Normal, repeat in 2009.   COLONOSCOPY N/A 10/08/2015   Procedure: COLONOSCOPY;  Surgeon: Danie Binder, MD;  Location: [facility] ENDO SUITE;  Service: Endoscopy;  Laterality: N/A;  230 - moved to 1:30, spoke with pt    COLONOSCOPY WITH PROPOFOL N/A 01/28/2021   Procedure: COLONOSCOPY WITH PROPOFOL;  Surgeon: Eloise Harman, DO;  Location: [facility];  Service: Endoscopy;  Laterality: N/A;  9:45am   POLYPECTOMY  10/08/2015   Procedure: POLYPECTOMY;  Surgeon: Danie Binder, MD;  Location: [facility];  Service: Endoscopy;;  cecal polyp removed via cold forceps with one clip placed/ rectal polyp removed via cold forcep   POLYPECTOMY  01/28/2021   Procedure: POLYPECTOMY;  Surgeon: Eloise Harman, DO;  Location: [facility];  Service: Endoscopy;;   SHOULDER SURGERY  2006   Left-post-trauma   TOTAL ABDOMINAL HYSTERECTOMY W/ BILATERAL SALPINGOOPHORECTOMY       OB History   No obstetric history on file.     Family History  Problem Relation Age of Onset   Hypertension Mother    Arthritis Mother    Stroke Father    Hypertension  Father    Coronary artery disease Father    Diabetes Sister    Sarcoidosis Sister    Diabetes Sister    Heart failure Brother    Congestive Heart Failure Brother    Colon cancer Neg Hx     Social History   Tobacco Use   Smoking status: Never   Smokeless tobacco: Never  Vaping Use   Vaping Use: Never used  Substance Use Topics   Alcohol use: No   Drug use: No    Home Medications Prior to Admission medications   Medication Sig Start Date End Date Taking? Authorizing Provider  atorvastatin (LIPITOR) 80 MG tablet Take 1 tablet (80 mg total) by mouth daily. 08/15/20   Fayrene Helper, MD  benazepril (LOTENSIN) 20 MG tablet TAKE 1 TABLET BY MOUTH EVERY DAY 12/10/20   Fayrene Helper, MD  diltiazem (CARDIZEM CD) 120 MG 24 hr capsule TAKE 1 CAPSULE BY MOUTH EVERY DAY 12/18/20   Fayrene Helper, MD  furosemide (LASIX) 20 MG tablet TAKE 1 TABLET (20 MG TOTAL) BY MOUTH DAILY AS NEEDED FOR FLUID. 08/16/20   Arnoldo Lenis, MD  metFORMIN (GLUCOPHAGE-XR) 500 MG 24 hr tablet TAKE 1 TABLET BY MOUTH EVERY DAY WITH BREAKFAST 01/24/21   Noreene Larsson, NP  metoprolol tartrate (LOPRESSOR) 100 MG tablet TAKE 1 TABLET BY MOUTH TWICE A DAY 02/19/21   Fayrene Helper, MD  polyethylene glycol-electrolytes (TRILYTE) 420 g solution Take 4,000 mLs by mouth as directed. 01/01/21   Eloise Harman, DO  warfarin (COUMADIN) 5 MG tablet TAKE 1 TABLET DAILY EXCEPT 1/2 TABLET ON SUNDAY AND THURSDAY OR AS DIRECTED BY COUMADIN CLINIC 10/01/20   Fayrene Helper, MD    Allergies    Patient has no known allergies.  Review of Systems   Review of Systems  Constitutional:  Positive for activity change. Negative for appetite change, chills, diaphoresis, fatigue, fever and unexpected weight change.  HENT: Negative.    Eyes: Negative.   Respiratory:  Positive for shortness of breath. Negative for cough, chest tightness and wheezing.        DOE  Cardiovascular:  Positive for chest pain. Negative for  palpitations and leg swelling.  Gastrointestinal: Negative.   Musculoskeletal:  Positive for myalgias. Negative for neck pain and neck stiffness.  Skin: Negative.   Neurological:  Positive for weakness.   Physical Exam Updated Vital Signs BP 128/84 (BP Location: Right Arm)   Pulse 72   Temp 98.1 F (36.7 C) (Oral)   Resp (!) 22   Ht 5\' 2"  (1.575 m)   Wt 93.4 kg   SpO2 100%   BMI 37.68 kg/m   Physical Exam Vitals and nursing note reviewed.  Constitutional:      General: She is not in acute distress.    Appearance: She is obese. She is not ill-appearing or toxic-appearing.  HENT:  Head: Normocephalic and atraumatic.     Nose: Nose normal. No congestion.     Mouth/Throat:     Mouth: Mucous membranes are moist.     Pharynx: Oropharynx is clear. Uvula midline. No oropharyngeal exudate or posterior oropharyngeal erythema.     Tonsils: No tonsillar exudate.  Eyes:     General: Lids are normal. Vision grossly intact.        Right eye: No discharge.        Left eye: No discharge.     Extraocular Movements: Extraocular movements intact.     Conjunctiva/sclera: Conjunctivae normal.     Pupils: Pupils are equal, round, and reactive to light.  Neck:     Trachea: Trachea and phonation normal.     Meningeal: Brudzinski's sign and Kernig's sign absent.  Cardiovascular:     Rate and Rhythm: Normal rate and regular rhythm.     Pulses: Normal pulses.     Heart sounds: Normal heart sounds.  Pulmonary:     Effort: Pulmonary effort is normal. No tachypnea, bradypnea, accessory muscle usage, prolonged expiration or respiratory distress.     Breath sounds: Normal breath sounds. No wheezing or rales.     Comments: Tachypnea and with standing and ambulation in the room. Chest:     Chest wall: No mass, lacerations, deformity, swelling, tenderness, crepitus or edema.  Abdominal:     General: Bowel sounds are normal. There is no distension.     Palpations: Abdomen is soft.     Tenderness:  There is no abdominal tenderness. There is no right CVA tenderness, left CVA tenderness, guarding or rebound.  Musculoskeletal:        General: No deformity.     Cervical back: Normal range of motion and neck supple. No edema, rigidity or crepitus. No pain with movement, spinous process tenderness or muscular tenderness.     Right lower leg: No edema.     Left lower leg: No edema.       Legs:     Comments: Patient endorses stiffness in her shoulders and her hips as well as her knees but is able to fully range all 4 extremities.  Lymphadenopathy:     Cervical: No cervical adenopathy.  Skin:    General: Skin is warm and dry.     Capillary Refill: Capillary refill takes less than 2 seconds.  Neurological:     General: No focal deficit present.     Mental Status: She is alert and oriented to person, place, and time. Mental status is at baseline.     GCS: GCS eye subscore is 4. GCS verbal subscore is 5. GCS motor subscore is 6.     Cranial Nerves: Cranial nerves are intact.     Sensory: Sensation is intact.     Motor: Motor function is intact. No pronator drift.     Coordination: Coordination is intact. Romberg sign negative.     Gait: Gait is intact.     Comments: Dyspnea on ambulation, nonfocal neurologic exam.  Psychiatric:        Mood and Affect: Mood normal.    ED Results / Procedures / Treatments   Labs (all labs ordered are listed, but only abnormal results are displayed) Labs Reviewed  CBC WITH DIFFERENTIAL/PLATELET - Abnormal; Notable for the following components:      Result Value   RDW 15.8 (*)    All other components within normal limits  COMPREHENSIVE METABOLIC PANEL - Abnormal; Notable for the following components:  Glucose, Bld 100 (*)    Total Protein 8.2 (*)    All other components within normal limits  URINALYSIS, ROUTINE W REFLEX MICROSCOPIC - Abnormal; Notable for the following components:   Hgb urine dipstick SMALL (*)    All other components within normal  limits  PROTIME-INR - Abnormal; Notable for the following components:   Prothrombin Time 26.4 (*)    INR 2.4 (*)    All other components within normal limits  RESP PANEL BY RT-PCR (FLU A&B, COVID) ARPGX2  TROPONIN I (HIGH SENSITIVITY)  TROPONIN I (HIGH SENSITIVITY)    EKG EKG: Afib, no STEMI.      Radiology   EXAM:  CT HEAD WITHOUT CONTRAST     TECHNIQUE:  Contiguous axial images were obtained from the base of the skull  through the vertex without intravenous contrast.     COMPARISON:  06/02/2019     FINDINGS:  Brain: No evidence of acute infarction, hemorrhage, cerebral edema,  mass, mass effect, or midline shift. Ventricles and sulci are within  normal limits for age. No extra-axial fluid collection.     Vascular: No hyperdense vessel or unexpected calcification.     Skull: Normal. Negative for fracture or focal lesion.     Sinuses/Orbits: Mucosal thickening in the ethmoid air cells. Status  post bilateral lens replacements.     Other: The mastoid air cells are well aerated.     IMPRESSION:  No acute intracranial process   EXAM:  CT ANGIOGRAPHY CHEST WITH CONTRAST     TECHNIQUE:  Multidetector CT imaging of the chest was performed using the  standard protocol during bolus administration of intravenous  contrast. Multiplanar CT image reconstructions and MIPs were  obtained to evaluate the vascular anatomy.     CONTRAST:  1mL OMNIPAQUE IOHEXOL 350 MG/ML SOLN     COMPARISON:  Chest x-ray 03/19/2021, thyroid ultrasound 06/22/2019     FINDINGS:  Cardiovascular: Satisfactory opacification of the pulmonary arteries  to the segmental level. Small somewhat linear web like defect within  subsegmental right lower lobe pulmonary vessel, series 6, image 170,  sagittal series 11, image 149. No additional discrete filling  defects are visualized. Aorta nonaneurysmal. Mild aortic  atherosclerosis. Cardiomegaly. No pericardial effusion     Mediastinum/Nodes:  Midline trachea. Heterogeneous enlarged thyroid  gland with multiple nodules and calcifications. This has been  evaluated on previous imaging. (ref: J Am Coll Radiol. 2015  Feb;12(2): 143-50).     Lungs/Pleura: Lungs are clear. No pleural effusion or pneumothorax.     Upper Abdomen: No acute abnormality.     Musculoskeletal: No chest wall abnormality. No acute or significant  osseous findings.     Review of the MIP images confirms the above findings.     IMPRESSION:  1. Small linear/web-like defect within subsegmental right lower lobe  pulmonary vessel has an appearance suggesting sequela of chronic PE.  No convincing acute filling defects are visualized to suggest acute  PE.  2. Cardiomegaly  3. No acute airspace disease     Aortic Atherosclerosis (ICD10-I70.0).       Procedures Procedures   Medications Ordered in ED Medications  iohexol (OMNIPAQUE) 350 MG/ML injection 100 mL (80 mLs Intravenous Contrast Given 03/19/21 2024)    ED Course  I have reviewed the triage vital signs and the nursing notes.  Pertinent labs & imaging results that were available during my care of the patient were reviewed by me and considered in my medical decision making (see chart  for details).  Clinical Course as of 03/23/21 0951  Tue Mar 19, 2021  2043 Patient ambulated with maintenance of her oxygen saturation on room air, heart rate ranging from mid 90s to105 with ambulation. [RS]    Clinical Course User Index [RS] Cage Gupton, Sharlene Dory   MDM Rules/Calculators/A&P                         70 year old female presents with concern for dyspnea and central chest pressure on exertion as well as generalized weakness and "stiffness" in her joints.  The differential diagnosis of weakness includes but is not limited to: Neurologic causes: GBS, myasthenia gravis, CVA, MS, ALS, transverse myelitis, spinal cord injury, CVA, botulism Other causes: ACS, Arrhythmia, syncope, orthostatic  hypotension, sepsis, hypoglycemia, electrolyte disturbance, hypothyroidism, respiratory failure, symptomatic anemia, dehydration, heat injury, polypharmacy, malignancy.  Must also consider pulmonary embolism in context of chest pain and dyspnea on exertion.  Vital signs are normal on intake.  Cardiopulmonary exam is normal, abdominal exam is benign.  Neurologic exam is nonfocal and musculoskeletal exam revealed symmetric strength and sensation all 4 extremities.  Patient did become dyspneic with ambulation around the room and tachypneic.  CBC unremarkable, CMP unremarkable.  Troponin is negative, 5.  Repeat troponin is negative, 4.  Coags revealed therapeutic INR 2.4 on warfarin.  Respite pathogen panel is negative and UA is unremarkable.  Chest x-ray and CT head are also very reassuring.  Given chest pain and dyspnea on exertion must consider pulmonary embolism; CTA ordered.  CTA chest significant for residual changes of chronic PE without acute pulmonary embolus or other abnormality.  Patient with ambulating around the room remains of her oxygen saturation a very minimal elevation of her heart rate.  Upon reevaluation the patient does state that she feels back to her baseline and is no longer having any shortness of breath, chest pressure, or sensation of weakness.  States that she feels she is ready to be discharged home.  While exact cause of the patient's symptoms earlier today remains unclear, does not appear to be any emergent etiology at this time.  Would recommend close outpatient follow-up with her cardiologist as well as her PCP.  No further work-up warranted in the ED at this time.  Solina voiced understanding of her medical evaluation and treatment plan.  Each of her questions was answered to her expressed satisfaction.  Return precautions were given.  Patient is well-appearing, stable, and appropriate for discharge at this time.  This chart was dictated using voice recognition  software, Dragon. Despite the best efforts of this provider to proofread and correct errors, errors may still occur which can change documentation meaning.    Final Clinical Impression(s) / ED Diagnoses Final diagnoses:  Weakness    Rx / DC Orders ED Discharge Orders     None        Emeline Darling, PA-C 03/23/21 0955    Daleen Bo, MD 03/24/21 [phone]

## 2021-03-19 NOTE — ED Notes (Signed)
Pt ambulated in Hallway  02 96%-98% HR 98-107

## 2021-03-19 NOTE — Progress Notes (Signed)
   Samantha Vega     MRN: [id number]      DOB: [birth year]   HPI Ms. Viglione is here with a 2 day history of acute stiffness a in joints esp hips and knees, legs and arms feel stiff and she can hardly move, started around lunch time the yesterday, prior to this she was in her normal state of health  No fever, chills or respiratory symptoms ROS Denies recent fever or chills. Denies sinus pressure, nasal congestion, ear pain or sore throat. Denies chest congestion, productive cough or wheezing. Denies chest pains, palpitations and leg swelling Denies abdominal pain, nausea, vomiting,diarrhea or constipation.   Denies dysuria, frequency, hesitancy or incontinence.  Denies depression, anxiety or insomnia. Denies skin break down or rash.   PE  BP 126/83   Patient alert and oriented and in no cardiopulmonary distress.pt sitting in chair and in pain  HEENT: No facial asymmetry, EOMI,     Neck decreased ROM.  Chest: Clear to auscultation bilaterally.  CVS: S1, S2 no murmurs, no S3.Regular rate.  ABD: Soft non tender.   Ext: No edema  MS: Markedly reduced  ROM spine, shoulders, hips and knees.  Skin: Intact, no ulcerations or rash noted.  Psych: Good eye contact, normal affect. Memory intact not anxious or depressed appearing.  CNS: CN 2-12 intact, power,  normal throughout.no focal deficits noted.   Assessment & Plan  Generalized pain Acute onset of generalized pain and stiffness unclear etiology, debilitating needs ED eval, direct contact made with the MD Transported by spouse to the ED

## 2021-03-19 NOTE — Discharge Instructions (Addendum)
You were seen in the ER today for your chest pain and your weakness.  Physical exam, vitals, blood work, and CT scans were very reassuring.  There is no emergent problem with your heart or lungs.  While the exact cause of your weakness remains unclear, there does not appear to be any dangerous cause at this time.  Please continue to follow-up with your primary care doctor and your cardiologist Dr. Harl Bowie as needed.  Return to the ER with any new severe symptoms.

## 2021-03-19 NOTE — Assessment & Plan Note (Signed)
Acute onset of generalized pain and stiffness unclear etiology, debilitating needs ED eval, direct contact made with the MD Transported by spouse to the ED

## 2021-03-19 NOTE — ED Triage Notes (Addendum)
Pt. Arrived via POV with complaints of stiffness to their right leg and arm. Pt. Also states they have a hx of heart problems.

## 2021-03-20 NOTE — ED Provider Notes (Signed)
  Face-to-face evaluation   History: Patient went to see her PCP today and told them she had some chest discomfort.  She also complains of a stiff feeling in her legs, but on further questioning describes it as pain primarily in her hips.  No known trauma.  No coughing or shortness of breath.  She has been taking her usual medications.  Physical exam: Elderly female alert and cooperative.  No dysarthria or aphasia.  No respiratory distress.  Chest is nontender to palpation.  Abdomen is soft.  She is able to independently move her arms and legs, bilaterally.  Medical screening examination/treatment/procedure(s) were conducted as a shared visit with non-physician practitioner(s) and myself.  I personally evaluated the patient during the encounter    Daleen Bo, MD 03/24/21 [phone]

## 2021-03-26 ENCOUNTER — Ambulatory Visit (INDEPENDENT_AMBULATORY_CARE_PROVIDER_SITE_OTHER): Payer: Medicare Other | Admitting: *Deleted

## 2021-03-26 DIAGNOSIS — I480 Paroxysmal atrial fibrillation: Secondary | ICD-10-CM

## 2021-03-26 DIAGNOSIS — Z5181 Encounter for therapeutic drug level monitoring: Secondary | ICD-10-CM | POA: Diagnosis not present

## 2021-03-26 LAB — POCT INR: INR: 2.8 (ref 2.0–3.0)

## 2021-03-26 NOTE — Patient Instructions (Signed)
Continue warfarin 1 tablet daily except 1/2 tablet on Saturdays Recheck in 6 wks

## 2021-04-01 ENCOUNTER — Other Ambulatory Visit: Payer: Self-pay

## 2021-04-01 ENCOUNTER — Telehealth: Payer: Self-pay

## 2021-04-01 DIAGNOSIS — E1159 Type 2 diabetes mellitus with other circulatory complications: Secondary | ICD-10-CM

## 2021-04-01 NOTE — Telephone Encounter (Signed)
Patient called about test results and labs. Call back # [phone].

## 2021-04-01 NOTE — Telephone Encounter (Signed)
Her labs from  9/26 Samantha Vega documented that she spoke with patient about her results and pt understood. Pt now calling about her results- please call pt and review again with her

## 2021-04-02 NOTE — Telephone Encounter (Signed)
Tired call pt no answer mailing out letter today

## 2021-04-11 ENCOUNTER — Other Ambulatory Visit: Payer: Self-pay | Admitting: Cardiology

## 2021-04-11 ENCOUNTER — Telehealth: Payer: Self-pay | Admitting: *Deleted

## 2021-04-11 NOTE — Telephone Encounter (Signed)
Patient's husband calling for appointment and time, was given the information.

## 2021-04-12 ENCOUNTER — Ambulatory Visit (INDEPENDENT_AMBULATORY_CARE_PROVIDER_SITE_OTHER): Payer: Medicare Other | Admitting: Podiatry

## 2021-04-12 ENCOUNTER — Encounter: Payer: Self-pay | Admitting: Podiatry

## 2021-04-12 ENCOUNTER — Other Ambulatory Visit: Payer: Self-pay

## 2021-04-12 DIAGNOSIS — M79675 Pain in left toe(s): Secondary | ICD-10-CM

## 2021-04-12 DIAGNOSIS — B351 Tinea unguium: Secondary | ICD-10-CM | POA: Diagnosis not present

## 2021-04-12 DIAGNOSIS — E119 Type 2 diabetes mellitus without complications: Secondary | ICD-10-CM | POA: Diagnosis not present

## 2021-04-12 DIAGNOSIS — M79674 Pain in right toe(s): Secondary | ICD-10-CM | POA: Diagnosis not present

## 2021-04-12 NOTE — Progress Notes (Signed)
  Subjective:  Patient ID: Samantha Vega, female    DOB: [birth year],  MRN: [id number]  Chief Complaint  Patient presents with   Nail Problem    Nail trim    70 y.o. female returns for the above complaint.  Patient presents with thickened elongated dystrophic toenails x10.  Mild pain on palpation.  Patient would like to have them debrided after is not able to do it herself.  She denies any other acute complaints.  She is a diabetic with last A1c of 6.6  Objective:  There were no vitals filed for this visit. Podiatric Exam: Vascular: dorsalis pedis and posterior tibial pulses are palpable bilateral. Capillary return is immediate. Temperature gradient is WNL. Skin turgor WNL  Sensorium: Normal Semmes Weinstein monofilament test. Normal tactile sensation bilaterally. Nail Exam: Pt has thick disfigured discolored nails with subungual debris noted bilateral entire nail hallux through fifth toenails.  Pain on palpation to the nails. Ulcer Exam: There is no evidence of ulcer or pre-ulcerative changes or infection. Orthopedic Exam: Muscle tone and strength are WNL. No limitations in general ROM. No crepitus or effusions noted.  Skin: No Porokeratosis. No infection or ulcers    Assessment & Plan:   1. Diabetes mellitus type 2, diet-controlled ([place])   2. Pain due to onychomycosis of toenails of both feet     Patient was evaluated and treated and all questions answered.  Onychomycosis with pain  -Nails palliatively debrided as below. -Educated on self-care  Procedure: Nail Debridement Rationale: pain  Type of Debridement: manual, sharp debridement. Instrumentation: Nail nipper, rotary burr. Number of Nails: 10  Procedures and Treatment: Consent by patient was obtained for treatment procedures. The patient understood the discussion of treatment and procedures well. All questions were answered thoroughly reviewed. Debridement of mycotic and hypertrophic toenails, 1 through 5 bilateral  and clearing of subungual debris. No ulceration, no infection noted.  Return Visit-Office Procedure: Patient instructed to return to the office for a follow up visit 3 months for continued evaluation and treatment.  Boneta Lucks, DPM    Return in about 3 months (around 07/13/2021).

## 2021-04-24 ENCOUNTER — Ambulatory Visit (INDEPENDENT_AMBULATORY_CARE_PROVIDER_SITE_OTHER): Payer: Medicare Other | Admitting: Cardiology

## 2021-04-24 ENCOUNTER — Encounter: Payer: Self-pay | Admitting: Cardiology

## 2021-04-24 ENCOUNTER — Other Ambulatory Visit: Payer: Self-pay

## 2021-04-24 VITALS — BP 132/74 | HR 90 | Wt 202.4 lb

## 2021-04-24 DIAGNOSIS — R079 Chest pain, unspecified: Secondary | ICD-10-CM | POA: Diagnosis not present

## 2021-04-24 DIAGNOSIS — I10 Essential (primary) hypertension: Secondary | ICD-10-CM | POA: Diagnosis not present

## 2021-04-24 DIAGNOSIS — E782 Mixed hyperlipidemia: Secondary | ICD-10-CM

## 2021-04-24 NOTE — Patient Instructions (Signed)
Medication Instructions:  Your physician recommends that you continue on your current medications as directed. Please refer to the Current Medication list given to you today.  *If you need a refill on your cardiac medications before your next appointment, please call your pharmacy*   Lab Work: None If you have labs (blood work) drawn today and your tests are completely normal, you will receive your results only by: [place] (if you have MyChart) OR A paper copy in the mail If you have any lab test that is abnormal or we need to change your treatment, we will call you to review the results.   Testing/Procedures: Your physician has requested that you have a lexiscan myoview. For further information please visit [email or web address]. Please follow instruction sheet, as given.    Follow-Up: At [facility], you and your health needs are our priority.  As part of our continuing mission to provide you with exceptional heart care, we have created designated Provider Care Teams.  These Care Teams include your primary Cardiologist (physician) and Advanced Practice Providers (APPs -  Physician Assistants and Nurse Practitioners) who all work together to provide you with the care you need, when you need it.  We recommend signing up for the patient portal called "MyChart".  Sign up information is provided on this After Visit Summary.  MyChart is used to connect with patients for Virtual Visits (Telemedicine).  Patients are able to view lab/test results, encounter notes, upcoming appointments, etc.  Non-urgent messages can be sent to your provider as well.   To learn more about what you can do with MyChart, go to [email or web address].    Your next appointment:   6 month(s)  The format for your next appointment:   In Person  Provider:   Carlyle Dolly, MD    Other Instructions

## 2021-04-24 NOTE — Progress Notes (Signed)
Clinical Summary [name] is a 70 y.o.female seen today for follow up of the following medical problems.   1. Chest pain - history of atypical chest pain - she denies any recent symptoms.    - no chest pains  - ER visit 03/19/2021 with chest pain - trop neg x 2. EKG without acute ischemic changes - CT PE no acute PE, questionable chronic PE  - sharp pain entire chest, mild pain. Can be positional. Lasts just a few minutes. No other associated symptoms. Can occur at rest or with activity. Occurs few times a week - sedentary lifestyle, some DOE with house cleaning.   CAD risk factors: HL, HTN, DM2     2. Paroxysmal afib - rate control strategy w/ diltiazem and metoprolol   - previously on xarelto however cost was too much, changed to coumadin.       - denies any palpitations - compliant with meds - no bleeding on coumadin   3. HTN   - she is compliant with meds   4. HL   - she is on atorvastatin   07/2020 TC 153 TG 76 HDL 38 LDL 100 02/2021 TC 145 TG 89 HDL 37 LDL 91   5. LE edema - contorolled with lasix   Past Medical History:  Diagnosis Date   Annual physical exam 06/07/2015   Arthritis    Baker's cyst of knee 10/04/2013   Chest pain    Noncardiac; evaluated and treated in 2008   Diabetes mellitus    Dyspnea on exertion 10/14/2019   Enlarged heart    Hyperlipidemia    Hypertension    Metabolic syndrome X 3/74/8270   OA (osteoarthritis) of knee 05/05/2012   Left, takes tramadol on avg twice per week   Obesity    PAF (paroxysmal atrial fibrillation) (HCC)    Right bundle Samantha Vega block    +Palpitations.   EKG 11/2006:  NSR, RBBB, LAFB, markedly delayed R-wave progression, no change; Echocardiogram in 10/2006-suboptimal quality, normal; Event recorder-PVCs, no significant arrhythmias or symptoms ;   TIA (transient ischemic attack) 06/02/2019   Unspecified visual loss 12/08/2008     No Known Allergies   Current Outpatient Medications  Medication Sig  Dispense Refill   atorvastatin (LIPITOR) 80 MG tablet Take 1 tablet (80 mg total) by mouth daily. 90 tablet 3   benazepril (LOTENSIN) 20 MG tablet TAKE 1 TABLET BY MOUTH EVERY DAY 90 tablet 1   diltiazem (CARDIZEM CD) 120 MG 24 hr capsule TAKE 1 CAPSULE BY MOUTH EVERY DAY 90 capsule 1   furosemide (LASIX) 20 MG tablet TAKE 1 TABLET (20 MG TOTAL) BY MOUTH DAILY AS NEEDED FOR FLUID. 90 tablet 1   metFORMIN (GLUCOPHAGE-XR) 500 MG 24 hr tablet TAKE 1 TABLET BY MOUTH EVERY DAY WITH BREAKFAST 90 tablet 1   metoprolol tartrate (LOPRESSOR) 100 MG tablet TAKE 1 TABLET BY MOUTH TWICE A DAY 180 tablet 0   polyethylene glycol-electrolytes (TRILYTE) 420 g solution Take 4,000 mLs by mouth as directed. 4000 mL 0   warfarin (COUMADIN) 5 MG tablet TAKE 1 TABLET DAILY EXCEPT 1/2 TABLET ON SUNDAY AND THURSDAY OR AS DIRECTED BY COUMADIN CLINIC 100 tablet 3   No current facility-administered medications for this visit.     Past Surgical History:  Procedure Laterality Date   ABDOMINAL HYSTERECTOMY     BREAST BIOPSY Left    benign   CATARACT EXTRACTION W/PHACO Right 01/08/2016   Procedure: CATARACT EXTRACTION PHACO AND INTRAOCULAR LENS  PLACEMENT (IOC);  Surgeon: Rutherford Guys, MD;  Location: [facility];  Service: Ophthalmology;  Laterality: Right;  CDE: 5.78   CATARACT EXTRACTION W/PHACO Left 02/05/2016   Procedure: CATARACT EXTRACTION PHACO AND INTRAOCULAR LENS PLACEMENT LEFT EYE CDE=6.50;  Surgeon: Rutherford Guys, MD;  Location: [facility];  Service: Ophthalmology;  Laterality: Left;   COLONOSCOPY  2006   Dr. Tamala Julian: Normal, repeat in 2009.   COLONOSCOPY N/A 10/08/2015   Procedure: COLONOSCOPY;  Surgeon: Danie Binder, MD;  Location: [facility];  Service: Endoscopy;  Laterality: N/A;  230 - moved to 1:30, spoke with pt    COLONOSCOPY WITH PROPOFOL N/A 01/28/2021   Procedure: COLONOSCOPY WITH PROPOFOL;  Surgeon: Eloise Harman, DO;  Location: [facility];  Service: Endoscopy;  Laterality: N/A;  9:45am   POLYPECTOMY   10/08/2015   Procedure: POLYPECTOMY;  Surgeon: Danie Binder, MD;  Location: [facility];  Service: Endoscopy;;  cecal polyp removed via cold forceps with one clip placed/ rectal polyp removed via cold forcep   POLYPECTOMY  01/28/2021   Procedure: POLYPECTOMY;  Surgeon: Eloise Harman, DO;  Location: [facility];  Service: Endoscopy;;   SHOULDER SURGERY  2006   Left-post-trauma   TOTAL ABDOMINAL HYSTERECTOMY W/ BILATERAL SALPINGOOPHORECTOMY       No Known Allergies    Family History  Problem Relation Age of Onset   Hypertension Mother    Arthritis Mother    Stroke Father    Hypertension Father    Coronary artery disease Father    Diabetes Sister    Sarcoidosis Sister    Diabetes Sister    Heart failure Brother    Congestive Heart Failure Brother    Colon cancer Neg Hx      Social History [name] reports that she has never smoked. She has never used smokeless tobacco. [name] reports no history of alcohol use.   Review of Systems CONSTITUTIONAL: No weight loss, fever, chills, weakness or fatigue.  HEENT: Eyes: No visual loss, blurred vision, double vision or yellow sclerae.No hearing loss, sneezing, congestion, runny nose or sore throat.  SKIN: No rash or itching.  CARDIOVASCULAR: per hpi RESPIRATORY: No shortness of breath, cough or sputum.  GASTROINTESTINAL: No anorexia, nausea, vomiting or diarrhea. No abdominal pain or blood.  GENITOURINARY: No burning on urination, no polyuria NEUROLOGICAL: No headache, dizziness, syncope, paralysis, ataxia, numbness or tingling in the extremities. No change in bowel or bladder control.  MUSCULOSKELETAL: No muscle, back pain, joint pain or stiffness.  LYMPHATICS: No enlarged nodes. No history of splenectomy.  PSYCHIATRIC: No history of depression or anxiety.  ENDOCRINOLOGIC: No reports of sweating, cold or heat intolerance. No polyuria or polydipsia.  Marland Kitchen   Physical Examination Today's Vitals   04/24/21 0814  BP:  132/74  Pulse: 90  SpO2: 95%  Weight: 202 lb 6.4 oz (91.8 kg)   Body mass index is 37.02 kg/m.  Gen: resting comfortably, no acute distress HEENT: no scleral icterus, pupils equal round and reactive, no palptable cervical adenopathy,  CV: irreg, no mrg, no jvd Resp: Clear to auscultation bilaterally GI: abdomen is soft, non-tender, non-distended, normal bowel sounds, no hepatosplenomegaly MSK: extremities are warm, no edema.  Skin: warm, no rash Neuro:  no focal deficits Psych: appropriate affect   Diagnostic Studies 07/12/12 Echo: LVEF 55-60%, mild basal septal hypertrophy,     07/11/12 Carotid US: <50% bilateral stenosis, 3.4 cm thyroid nodule    05/2019 echo IMPRESSIONS     1.  Left ventricular ejection fraction, by visual estimation, is 50 to  55%. The left ventricle has low normal function. There is borderline left  ventricular hypertrophy.   2. Apical lateral segment, apical anterior segment, apical inferior  segment, and apex are abnormal.   3. Left ventricular diastolic parameters are indeterminate in the setting  of atrial fibrillation.   4. The left ventricle demonstrates regional wall motion abnormalities.   5. Global right ventricle has low normal systolic function.The right  ventricular size is normal. No increase in right ventricular wall  thickness.   6. Left atrial size was normal.   7. Right atrial size was normal.   8. Mild mitral annular calcification.   9. The mitral valve is grossly normal. Trivial mitral valve  regurgitation.  10. The tricuspid valve is grossly normal. Tricuspid valve regurgitation  is trivial.  11. The aortic valve is tricuspid. Aortic valve regurgitation is not  visualized.  12. The pulmonic valve was grossly normal. Pulmonic valve regurgitation is  trivial.  13. TR signal is inadequate for assessing pulmonary artery systolic  pressure.  14. The inferior vena cava is normal in size with greater than 50%  respiratory  variability, suggesting right atrial pressure of 3 mmHg.  15. No obvious right to left interatrial shunting by saline contrast.   Assessment and Plan  1. Chest pain Recent chest pains symptoms of unclear etiology - she does have multiple CAD risk factors including DM2, HTN, HL - will obtain 2 day lexiscan to further evaluate. Woulda avoid treadmill as I would not want to hold her av nodal agents for her difficult to control afib     2. Parox afib   DOACs too expensive, she is on coumadin - no recent symptoms, continue current meds   3. HTN:   -she is at goal, continue current meds   4. Hyperlipidemia - at goal, continue atorvastatin.      F/u 6 months    Arnoldo Lenis, M.D.

## 2021-04-30 ENCOUNTER — Other Ambulatory Visit: Payer: Self-pay

## 2021-04-30 ENCOUNTER — Ambulatory Visit (HOSPITAL_COMMUNITY)
Admission: RE | Admit: 2021-04-30 | Discharge: 2021-04-30 | Disposition: A | Discharge status: Home / Self Care | Payer: Medicare Other | Source: Ambulatory Visit | Attending: Cardiology | Admitting: Cardiology

## 2021-04-30 ENCOUNTER — Encounter (HOSPITAL_COMMUNITY)
Admission: RE | Admit: 2021-04-30 | Discharge: 2021-04-30 | Disposition: A | Discharge status: Home / Self Care | Payer: Medicare Other | Source: Ambulatory Visit | Attending: Cardiology | Admitting: Cardiology

## 2021-04-30 ENCOUNTER — Encounter (HOSPITAL_COMMUNITY): Payer: Self-pay

## 2021-04-30 DIAGNOSIS — R079 Chest pain, unspecified: Secondary | ICD-10-CM | POA: Insufficient documentation

## 2021-04-30 LAB — NM MYOCAR MULTI W/SPECT W/WALL MOTION / EF
LV dias vol: 86 mL (ref 46–106)
LV sys vol: 42 mL
Nuc Stress EF: 52 %
Peak HR: 94 {beats}/min
RATE: 0.4
Rest HR: 70 {beats}/min
Rest Nuclear Isotope Dose: 10.2 mCi
SDS: 2
SRS: 11
SSS: 13
ST Depression (mm): 0 mm
Stress Nuclear Isotope Dose: 29.9 mCi
TID: 1.18

## 2021-04-30 MED ORDER — REGADENOSON 0.4 MG/5ML IV SOLN
INTRAVENOUS | Status: AC
  Administered 2021-04-30: 0.4 mg via INTRAVENOUS
  Filled 2021-04-30: qty 5

## 2021-04-30 MED ORDER — TECHNETIUM TC 99M TETROFOSMIN IV KIT
10.0000 | PACK | Freq: Once | INTRAVENOUS | Status: AC | PRN
  Administered 2021-04-30: 10.2 via INTRAVENOUS

## 2021-04-30 MED ORDER — TECHNETIUM TC 99M TETROFOSMIN IV KIT
30.0000 | PACK | Freq: Once | INTRAVENOUS | Status: AC | PRN
  Administered 2021-04-30: 29.9 via INTRAVENOUS

## 2021-04-30 MED ORDER — SODIUM CHLORIDE FLUSH 0.9 % IV SOLN
INTRAVENOUS | Status: AC
  Administered 2021-04-30: 10 mL via INTRAVENOUS
  Filled 2021-04-30: qty 10

## 2021-04-30 MED ORDER — AMINOPHYLLINE 25 MG/ML IV SOLN
INTRAVENOUS | Status: AC
  Administered 2021-04-30: 75 mg via INTRAVENOUS
  Filled 2021-04-30: qty 10

## 2021-05-03 ENCOUNTER — Telehealth: Payer: Self-pay

## 2021-05-03 ENCOUNTER — Ambulatory Visit (INDEPENDENT_AMBULATORY_CARE_PROVIDER_SITE_OTHER): Payer: Medicare Other | Admitting: Family Medicine

## 2021-05-03 ENCOUNTER — Other Ambulatory Visit: Payer: Self-pay

## 2021-05-03 DIAGNOSIS — J3089 Other allergic rhinitis: Secondary | ICD-10-CM

## 2021-05-03 DIAGNOSIS — H1010 Acute atopic conjunctivitis, unspecified eye: Secondary | ICD-10-CM | POA: Insufficient documentation

## 2021-05-03 DIAGNOSIS — H1013 Acute atopic conjunctivitis, bilateral: Secondary | ICD-10-CM

## 2021-05-03 MED ORDER — MONTELUKAST SODIUM 10 MG PO TABS: 10.0000 mg | ORAL_TABLET | Freq: Every day | ORAL | 3 refills | Status: DC

## 2021-05-03 NOTE — Patient Instructions (Signed)
F/U as before, call if you need me sooner  Montelukast once daily for runny eyes and nose ( allergies, is prescribed  Thanks for choosing [facility], we consider it a privelige to serve you.

## 2021-05-03 NOTE — Telephone Encounter (Signed)
-----   Message from Arnoldo Lenis, MD sent at 05/01/2021 10:17 AM EST ----- Stress test shows some evidence of perhaps some old blockages in the heart but no active blockages. Would not require additional testing at this time. Needs to discuss with pcp potential other causes for her symptoms, f/u with [place] 3 months   Zandra Abts MD

## 2021-05-03 NOTE — Telephone Encounter (Signed)
Patient notified and voiced understanding. Pt had no questions or concerns at this time. Pt will call to schedule appt for 78m f/u

## 2021-05-03 NOTE — Progress Notes (Signed)
Virtual Visit via Telephone Note  I connected with Samantha Vega on 05/03/21 at  2:20 PM EST by telephone and verified that I am speaking with the correct person using two identifiers.  Location: Patient: home  Provider: office   I discussed the limitations, risks, security and privacy concerns of performing an evaluation and management service by telephone and the availability of in person appointments. I also discussed with the patient that there may be a patient responsible charge related to this service. The patient expressed understanding and agreed to proceed.   History of Present Illness: C/o clear bilateral eye drainage, no pain, no vision change, symptoms flare whenever there is a temperature change to cooler temperature Also c/o nasal congestion with clear runny nose, denies sore throat or productive cough   Observations/Objective: There were no vitals taken for this visit. Good communication with no confusion and intact memory. Alert and oriented x 3 No signs of respiratory distress during speech   Assessment and Plan:  Allergic conjunctivitis Increased and uncontrolled symptoms with current temp change, start daily singulair  Allergic rhinitis Uncontrolled symptoms, start daily singulair  Follow Up Instructions:    I discussed the assessment and treatment plan with the patient. The patient was provided an opportunity to ask questions and all were answered. The patient agreed with the plan and demonstrated an understanding of the instructions.   The patient was advised to call back or seek an in-person evaluation if the symptoms worsen or if the condition fails to improve as anticipated.  I provided 6 minutes of non-face-to-face time during this encounter.   Tula Nakayama, MD

## 2021-05-08 ENCOUNTER — Ambulatory Visit (INDEPENDENT_AMBULATORY_CARE_PROVIDER_SITE_OTHER): Payer: Medicare Other | Admitting: *Deleted

## 2021-05-08 ENCOUNTER — Encounter: Payer: Self-pay | Admitting: Family Medicine

## 2021-05-08 DIAGNOSIS — Z5181 Encounter for therapeutic drug level monitoring: Secondary | ICD-10-CM | POA: Diagnosis not present

## 2021-05-08 DIAGNOSIS — I480 Paroxysmal atrial fibrillation: Secondary | ICD-10-CM

## 2021-05-08 LAB — POCT INR: INR: 3.8 — AB (ref 2.0–3.0)

## 2021-05-08 NOTE — Patient Instructions (Signed)
Description   Hold warfarin today and then continue warfarin 1 tablet daily except 1/2 tablet on Saturdays Recheck in 3 wks

## 2021-05-08 NOTE — Assessment & Plan Note (Signed)
Uncontrolled symptoms, start daily singulair

## 2021-05-08 NOTE — Assessment & Plan Note (Signed)
Increased and uncontrolled symptoms with current temp change, start daily singulair

## 2021-05-27 ENCOUNTER — Other Ambulatory Visit: Payer: Self-pay | Admitting: Family Medicine

## 2021-05-29 ENCOUNTER — Ambulatory Visit (INDEPENDENT_AMBULATORY_CARE_PROVIDER_SITE_OTHER): Payer: Medicare Other | Admitting: *Deleted

## 2021-05-29 DIAGNOSIS — Z5181 Encounter for therapeutic drug level monitoring: Secondary | ICD-10-CM | POA: Diagnosis not present

## 2021-05-29 DIAGNOSIS — I480 Paroxysmal atrial fibrillation: Secondary | ICD-10-CM | POA: Diagnosis not present

## 2021-05-29 LAB — POCT INR: INR: 2.6 (ref 2.0–3.0)

## 2021-05-29 NOTE — Patient Instructions (Signed)
Continue warfarin 1 tablet daily except 1/2 tablet on Saturdays Recheck in 4 wks

## 2021-06-26 ENCOUNTER — Other Ambulatory Visit: Payer: Self-pay

## 2021-06-26 ENCOUNTER — Ambulatory Visit (INDEPENDENT_AMBULATORY_CARE_PROVIDER_SITE_OTHER): Payer: Medicare Other | Admitting: *Deleted

## 2021-06-26 DIAGNOSIS — Z5181 Encounter for therapeutic drug level monitoring: Secondary | ICD-10-CM | POA: Diagnosis not present

## 2021-06-26 DIAGNOSIS — I480 Paroxysmal atrial fibrillation: Secondary | ICD-10-CM

## 2021-06-26 LAB — POCT INR: INR: 4.9 — AB (ref 2.0–3.0)

## 2021-06-26 NOTE — Patient Instructions (Signed)
Hold warfarin today and tomorrow then resume 1 tablet daily except 1/2 tablet on Saturdays Recheck in 2 wks

## 2021-07-11 ENCOUNTER — Ambulatory Visit (INDEPENDENT_AMBULATORY_CARE_PROVIDER_SITE_OTHER): Payer: Medicare Other | Admitting: *Deleted

## 2021-07-11 DIAGNOSIS — Z5181 Encounter for therapeutic drug level monitoring: Secondary | ICD-10-CM | POA: Diagnosis not present

## 2021-07-11 DIAGNOSIS — I480 Paroxysmal atrial fibrillation: Secondary | ICD-10-CM | POA: Diagnosis not present

## 2021-07-11 LAB — POCT INR: INR: 3.8 — AB (ref 2.0–3.0)

## 2021-07-11 NOTE — Patient Instructions (Signed)
Hold warfarin today then decrease dose to 1 tablet daily except 1/2 tablet on Tuesdays, Thursdays and Saturdays Recheck in 3 wks

## 2021-07-22 ENCOUNTER — Ambulatory Visit (INDEPENDENT_AMBULATORY_CARE_PROVIDER_SITE_OTHER): Payer: Self-pay | Admitting: Podiatry

## 2021-07-22 ENCOUNTER — Other Ambulatory Visit: Payer: Self-pay | Admitting: Family Medicine

## 2021-07-22 DIAGNOSIS — Z91199 Patient's noncompliance with other medical treatment and regimen due to unspecified reason: Secondary | ICD-10-CM

## 2021-07-24 ENCOUNTER — Other Ambulatory Visit: Payer: Self-pay

## 2021-07-24 ENCOUNTER — Encounter: Payer: Medicare Other | Admitting: Family Medicine

## 2021-07-27 NOTE — Progress Notes (Signed)
   Complete physical exam  Patient: Samantha Vega   DOB: 04/05/2001   71 y.o. Female  MRN: [id number]  Subjective:    No chief complaint on file.   Samantha Vega is a 71 y.o. female who presents today for a complete physical exam. She reports consuming a {diet types:17450} diet. {types:19826} She generally feels {DESC; WELL/FAIRLY WELL/POORLY:18703}. She reports sleeping {DESC; WELL/FAIRLY WELL/POORLY:18703}. She {does/does not:200015} have additional problems to discuss today.    Most recent fall risk assessment:    12/11/2021   10:42 AM  Fall Risk   Falls in the past year? 0  Number falls in past yr: 0  Injury with Fall? 0  Risk for fall due to : No Fall Risks  Follow up Falls evaluation completed     Most recent depression screenings:    12/11/2021   10:42 AM 11/01/2020   10:46 AM  PHQ 2/9 Scores  PHQ - 2 Score 0 0  PHQ- 9 Score 5     {VISON DENTAL STD PSA (Optional):27386}  {History (Optional):23778}  Patient Care Team: Jessup, Joy, NP as PCP - General (Nurse Practitioner)   Outpatient Medications Prior to Visit  Medication Sig   fluticasone (FLONASE) 50 MCG/ACT nasal spray Place 2 sprays into both nostrils in the morning and at bedtime. After 7 days, reduce to once daily.   norgestimate-ethinyl estradiol (SPRINTEC 28) 0.25-35 MG-MCG tablet Take 1 tablet by mouth daily.   Nystatin POWD Apply liberally to affected area 2 times per day   spironolactone (ALDACTONE) 100 MG tablet Take 1 tablet (100 mg total) by mouth daily.   No facility-administered medications prior to visit.    ROS        Objective:     There were no vitals taken for this visit. {Vitals History (Optional):23777}  Physical Exam   No results found for any visits on 01/16/22. {Show previous labs (optional):23779}    Assessment & Plan:    Routine Health Maintenance and Physical Exam  Immunization History  Administered Date(s) Administered   DTaP 06/19/1999, 08/15/1999,  10/24/1999, 07/09/2000, 01/23/2004   Hepatitis A 11/19/2007, 11/24/2008   Hepatitis B 04/06/1999, 05/14/1999, 10/24/1999   HiB (PRP-OMP) 06/19/1999, 08/15/1999, 10/24/1999, 07/09/2000   IPV 06/19/1999, 08/15/1999, 04/13/2000, 01/23/2004   Influenza,inj,Quad PF,6+ Mos 02/24/2014   Influenza-Unspecified 05/26/2012   MMR 04/13/2001, 01/23/2004   Meningococcal Polysaccharide 11/24/2011   Pneumococcal Conjugate-13 07/09/2000   Pneumococcal-Unspecified 10/24/1999, 01/07/2000   Tdap 11/24/2011   Varicella 04/13/2000, 11/19/2007    Health Maintenance  Topic Date Due   HIV Screening  Never done   Hepatitis C Screening  Never done   INFLUENZA VACCINE  01/14/2022   PAP-Cervical Cytology Screening  01/16/2022 (Originally 04/04/2020)   PAP SMEAR-Modifier  01/16/2022 (Originally 04/04/2020)   TETANUS/TDAP  01/16/2022 (Originally 11/23/2021)   HPV VACCINES  Discontinued   COVID-19 Vaccine  Discontinued    Discussed health benefits of physical activity, and encouraged her to engage in regular exercise appropriate for her age and condition.  Problem List Items Addressed This Visit   None Visit Diagnoses     Annual physical exam    -  Primary   Cervical cancer screening       Need for Tdap vaccination          No follow-ups on file.     Joy Jessup, NP   

## 2021-07-29 ENCOUNTER — Other Ambulatory Visit: Payer: Self-pay | Admitting: Family Medicine

## 2021-08-01 ENCOUNTER — Ambulatory Visit (INDEPENDENT_AMBULATORY_CARE_PROVIDER_SITE_OTHER): Payer: Medicare Other | Admitting: *Deleted

## 2021-08-01 DIAGNOSIS — I480 Paroxysmal atrial fibrillation: Secondary | ICD-10-CM | POA: Diagnosis not present

## 2021-08-01 DIAGNOSIS — Z5181 Encounter for therapeutic drug level monitoring: Secondary | ICD-10-CM

## 2021-08-01 LAB — POCT INR: INR: 2.3 (ref 2.0–3.0)

## 2021-08-01 NOTE — Patient Instructions (Signed)
Continue warfarin 1 tablet daily except 1/2 tablet on Tuesdays, Thursdays and Saturdays Recheck in 4 wks 

## 2021-08-13 ENCOUNTER — Encounter: Payer: Self-pay | Admitting: Family Medicine

## 2021-08-13 ENCOUNTER — Other Ambulatory Visit: Payer: Self-pay

## 2021-08-13 ENCOUNTER — Ambulatory Visit (INDEPENDENT_AMBULATORY_CARE_PROVIDER_SITE_OTHER): Payer: Medicare Other | Admitting: Family Medicine

## 2021-08-13 VITALS — BP 148/79 | HR 84 | Resp 16 | Ht 62.0 in | Wt 200.1 lb

## 2021-08-13 DIAGNOSIS — Z1231 Encounter for screening mammogram for malignant neoplasm of breast: Secondary | ICD-10-CM | POA: Diagnosis not present

## 2021-08-13 DIAGNOSIS — Z0000 Encounter for general adult medical examination without abnormal findings: Secondary | ICD-10-CM | POA: Insufficient documentation

## 2021-08-13 DIAGNOSIS — I10 Essential (primary) hypertension: Secondary | ICD-10-CM

## 2021-08-13 DIAGNOSIS — R079 Chest pain, unspecified: Secondary | ICD-10-CM

## 2021-08-13 DIAGNOSIS — E1159 Type 2 diabetes mellitus with other circulatory complications: Secondary | ICD-10-CM | POA: Diagnosis not present

## 2021-08-13 DIAGNOSIS — Z0001 Encounter for general adult medical examination with abnormal findings: Secondary | ICD-10-CM

## 2021-08-13 DIAGNOSIS — E785 Hyperlipidemia, unspecified: Secondary | ICD-10-CM | POA: Diagnosis not present

## 2021-08-13 NOTE — Assessment & Plan Note (Signed)

## 2021-08-13 NOTE — Assessment & Plan Note (Signed)
Elevated at visit but generally well controlled, no med change DASH diet and commitment to daily physical activity for a minimum of 30 minutes discussed and encouraged, as a part of hypertension management. The importance of attaining a healthy weight is also discussed.  BP/Weight 08/13/2021 04/24/2021 03/19/2021 03/19/2021 03/08/2021 01/28/2021 03/07/3008  Systolic BP 794 997 182 099 068 96 934  Diastolic BP 79 74 84 83 72 59 89  Wt. (Lbs) 200.12 202.4 206 - 206 - 203  BMI 36.6 37.02 37.68 - 37.68 - 37.13

## 2021-08-13 NOTE — Assessment & Plan Note (Signed)
Likely gerd, advised to stop caffeine and work on weight loss also 3 hours no food before bed

## 2021-08-13 NOTE — Progress Notes (Signed)
° ° °  KELCY LAIBLE     MRN: [id number]      DOB: Oct 14, 1950  HPI: Patient is in for annual physical exam. C/o intermittent chest pain when lying down over past several days. Recently had full cardiology eval. Does drink caffeine Recent labs,  are reviewed. Immunization is reviewed , and  updated if needed.   PE: BP (!) 148/79    Pulse 84    Resp 16    Ht 5\' 2"  (1.575 m)    Wt 200 lb 1.9 oz (90.8 kg)    SpO2 97%    BMI 36.60 kg/m   Pleasant  female, alert and oriented x 3, in no cardio-pulmonary distress. Afebrile. HEENT No facial trauma or asymetry. Sinuses non tender.  Extra occullar muscles intact.. External ears normal, . Neck: decreased rom, no adenopathy,JVD or thyromegaly.No bruits.  Chest: Clear to ascultation bilaterally.No crackles or wheezes. Non tender to palpation    Cardiovascular system; Heart sounds normal,  S1 and  S2 ,no S3.  No murmur, or thrill. Apical beat not displaced, irregularly, irregular pulse Peripheral pulses normal.  Abdomen: Soft, non tender, no organomegaly or masses. No bruits. Bowel sounds normal. No guarding, tenderness or rebound.      Musculoskeletal exam: Decreased ROM of spine, hips , shoulders and knees. No deformity ,swelling or crepitus noted. No muscle wasting or atrophy.   Neurologic: Cranial nerves 2 to 12 intact. Power, tone ,sensation and reflexes normal throughout. No disturbance in gait. No tremor.  Skin: Intact, no ulceration, erythema , scaling or rash noted. Pigmentation normal throughout  Psych; Normal mood and affect. Judgement and concentration normal   Assessment & Plan:  Annual visit for general adult medical examination without abnormal findings Annual exam as documented. Counseling done  re healthy lifestyle involving commitment to 150 minutes exercise per week, heart healthy diet, and attaining healthy weight.The importance of adequate sleep also discussed. Regular seat belt use and home  safety, is also discussed. Changes in health habits are decided on by the patient with goals and time frames  set for achieving them. Immunization and cancer screening needs are specifically addressed at this visit.   Essential hypertension Elevated at visit but generally well controlled, no med change DASH diet and commitment to daily physical activity for a minimum of 30 minutes discussed and encouraged, as a part of hypertension management. The importance of attaining a healthy weight is also discussed.  BP/Weight 08/13/2021 04/24/2021 03/19/2021 03/19/2021 03/08/2021 01/28/2021 12/26/4578  Systolic BP 998 338 250 539 767 96 341  Diastolic BP 79 74 84 83 72 59 89  Wt. (Lbs) 200.12 202.4 206 - 206 - 203  BMI 36.6 37.02 37.68 - 37.68 - 37.13       Chest pain Likely gerd, advised to stop caffeine and work on weight loss also 3 hours no food before bed

## 2021-08-13 NOTE — Patient Instructions (Signed)
F/U in 6 months, flu vaccine at visit, call if you need me sooner ° °Please arrange diabetic screening eye exam in office at checkout ° °Please schedule June mammogram at checkout ° °Fasting CBC, lipid, cmp and eGFR, hBa1c, TSH and vit D and microalb today , [facility] on [address], pt preference ° °Blood pressure slightly high today, increase fresh and frozen vegetables and fruit and work on weight loss, no med changes ° °You need covid booster °Shingrix vaccines °TdAP, all these you need to get at your pharmacy , if able ° °It is important that you exercise regularly at least 30 minutes 5 times a week. If you develop chest pain, have severe difficulty breathing, or feel very tired, stop exercising immediately and seek medical attention  ° °Thanks for choosing [facility], we consider it a privelige to serve you. ° ° °

## 2021-08-17 ENCOUNTER — Other Ambulatory Visit: Payer: Self-pay | Admitting: Family Medicine

## 2021-08-24 LAB — CBC
Hematocrit: 37.6 % (ref 34.0–46.6)
Hemoglobin: 12.4 g/dL (ref 11.1–15.9)
MCH: 28.1 pg (ref 26.6–33.0)
MCHC: 33 g/dL (ref 31.5–35.7)
MCV: 85 fL (ref 79–97)
Platelets: 210 10*3/uL (ref 150–450)
RBC: 4.41 x10E6/uL (ref 3.77–5.28)
RDW: 14.5 % (ref 11.7–15.4)
WBC: 4.9 10*3/uL (ref 3.4–10.8)

## 2021-08-24 LAB — LIPID PANEL
Chol/HDL Ratio: 4.1 ratio (ref 0.0–4.4)
Cholesterol, Total: 146 mg/dL (ref 100–199)
HDL: 36 mg/dL — ABNORMAL LOW (ref >39)
LDL Chol Calc (NIH): 92 mg/dL (ref 0–99)
Triglycerides: 97 mg/dL (ref 0–149)
VLDL Cholesterol Cal: 18 mg/dL (ref 5–40)

## 2021-08-24 LAB — CMP14+EGFR
ALT: 19 IU/L (ref 0–32)
AST: 19 IU/L (ref 0–40)
Albumin/Globulin Ratio: 1.3 (ref 1.2–2.2)
Albumin: 4 g/dL (ref 3.7–4.7)
Alkaline Phosphatase: 131 IU/L — ABNORMAL HIGH (ref 44–121)
BUN/Creatinine Ratio: 19 (ref 12–28)
BUN: 15 mg/dL (ref 8–27)
Bilirubin Total: 0.4 mg/dL (ref 0.0–1.2)
CO2: 24 mmol/L (ref 20–29)
Calcium: 9.2 mg/dL (ref 8.7–10.3)
Chloride: 103 mmol/L (ref 96–106)
Creatinine, Ser: 0.81 mg/dL (ref 0.57–1.00)
Globulin, Total: 3.2 g/dL (ref 1.5–4.5)
Glucose: 91 mg/dL (ref 70–99)
Potassium: 3.8 mmol/L (ref 3.5–5.2)
Sodium: 142 mmol/L (ref 134–144)
Total Protein: 7.2 g/dL (ref 6.0–8.5)
eGFR: 77 mL/min/{1.73_m2} (ref >59)

## 2021-08-24 LAB — HEMOGLOBIN A1C
Est. average glucose Bld gHb Est-mCnc: 134 mg/dL
Hgb A1c MFr Bld: 6.3 % — ABNORMAL HIGH (ref 4.8–5.6)

## 2021-08-24 LAB — TSH: TSH: 1.28 u[IU]/mL (ref 0.450–4.500)

## 2021-08-24 LAB — MICROALBUMIN / CREATININE URINE RATIO

## 2021-08-24 LAB — VITAMIN D 25 HYDROXY (VIT D DEFICIENCY, FRACTURES): Vit D, 25-Hydroxy: 38.7 ng/mL (ref 30.0–100.0)

## 2021-08-29 ENCOUNTER — Ambulatory Visit: Payer: Medicare Other | Admitting: *Deleted

## 2021-08-29 DIAGNOSIS — Z5181 Encounter for therapeutic drug level monitoring: Secondary | ICD-10-CM | POA: Diagnosis not present

## 2021-08-29 DIAGNOSIS — I480 Paroxysmal atrial fibrillation: Secondary | ICD-10-CM | POA: Diagnosis not present

## 2021-08-29 LAB — POCT INR: INR: 2.4 (ref 2.0–3.0)

## 2021-08-29 NOTE — Patient Instructions (Signed)
Continue warfarin 1 tablet daily except 1/2 tablet on Tuesdays, Thursdays and Saturdays Recheck in 4 wks 

## 2021-09-06 ENCOUNTER — Emergency Department (HOSPITAL_COMMUNITY)
Admission: EM | Admit: 2021-09-06 | Discharge: 2021-09-06 | Disposition: A | Discharge status: Home / Self Care | Payer: Medicare Other | Attending: Emergency Medicine | Admitting: Emergency Medicine

## 2021-09-06 ENCOUNTER — Other Ambulatory Visit: Payer: Self-pay

## 2021-09-06 ENCOUNTER — Emergency Department (HOSPITAL_COMMUNITY): Payer: Medicare Other

## 2021-09-06 ENCOUNTER — Encounter (HOSPITAL_COMMUNITY): Payer: Self-pay

## 2021-09-06 DIAGNOSIS — I517 Cardiomegaly: Secondary | ICD-10-CM | POA: Diagnosis not present

## 2021-09-06 DIAGNOSIS — I10 Essential (primary) hypertension: Secondary | ICD-10-CM | POA: Insufficient documentation

## 2021-09-06 DIAGNOSIS — Z7984 Long term (current) use of oral hypoglycemic drugs: Secondary | ICD-10-CM | POA: Diagnosis not present

## 2021-09-06 DIAGNOSIS — R7989 Other specified abnormal findings of blood chemistry: Secondary | ICD-10-CM | POA: Diagnosis not present

## 2021-09-06 DIAGNOSIS — M25512 Pain in left shoulder: Secondary | ICD-10-CM | POA: Insufficient documentation

## 2021-09-06 DIAGNOSIS — R0789 Other chest pain: Secondary | ICD-10-CM | POA: Diagnosis present

## 2021-09-06 DIAGNOSIS — E119 Type 2 diabetes mellitus without complications: Secondary | ICD-10-CM | POA: Diagnosis not present

## 2021-09-06 DIAGNOSIS — R079 Chest pain, unspecified: Secondary | ICD-10-CM | POA: Diagnosis not present

## 2021-09-06 DIAGNOSIS — J3489 Other specified disorders of nose and nasal sinuses: Secondary | ICD-10-CM | POA: Diagnosis not present

## 2021-09-06 DIAGNOSIS — Z79899 Other long term (current) drug therapy: Secondary | ICD-10-CM | POA: Insufficient documentation

## 2021-09-06 DIAGNOSIS — M19012 Primary osteoarthritis, left shoulder: Secondary | ICD-10-CM | POA: Diagnosis not present

## 2021-09-06 DIAGNOSIS — I639 Cerebral infarction, unspecified: Secondary | ICD-10-CM | POA: Insufficient documentation

## 2021-09-06 DIAGNOSIS — S40912A Unspecified superficial injury of left shoulder, initial encounter: Secondary | ICD-10-CM | POA: Diagnosis not present

## 2021-09-06 DIAGNOSIS — R29818 Other symptoms and signs involving the nervous system: Secondary | ICD-10-CM | POA: Diagnosis not present

## 2021-09-06 DIAGNOSIS — M25712 Osteophyte, left shoulder: Secondary | ICD-10-CM | POA: Diagnosis not present

## 2021-09-06 DIAGNOSIS — Z20822 Contact with and (suspected) exposure to covid-19: Secondary | ICD-10-CM | POA: Insufficient documentation

## 2021-09-06 DIAGNOSIS — I6782 Cerebral ischemia: Secondary | ICD-10-CM | POA: Diagnosis not present

## 2021-09-06 DIAGNOSIS — I672 Cerebral atherosclerosis: Secondary | ICD-10-CM | POA: Diagnosis not present

## 2021-09-06 LAB — CBC
HCT: 37.5 % (ref 36.0–46.0)
Hemoglobin: 12.5 g/dL (ref 12.0–15.0)
MCH: 29.3 pg (ref 26.0–34.0)
MCHC: 33.3 g/dL (ref 30.0–36.0)
MCV: 87.8 fL (ref 80.0–100.0)
Platelets: 198 10*3/uL (ref 150–400)
RBC: 4.27 MIL/uL (ref 3.87–5.11)
RDW: 15.5 % (ref 11.5–15.5)
WBC: 7.4 10*3/uL (ref 4.0–10.5)
nRBC: 0 % (ref 0.0–0.2)

## 2021-09-06 LAB — COMPREHENSIVE METABOLIC PANEL
ALT: 30 U/L (ref 0–44)
AST: 28 U/L (ref 15–41)
Albumin: 3.8 g/dL (ref 3.5–5.0)
Alkaline Phosphatase: 104 U/L (ref 38–126)
Anion gap: 8 (ref 5–15)
BUN: 14 mg/dL (ref 8–23)
CO2: 25 mmol/L (ref 22–32)
Calcium: 9 mg/dL (ref 8.9–10.3)
Chloride: 103 mmol/L (ref 98–111)
Creatinine, Ser: 0.79 mg/dL (ref 0.44–1.00)
GFR, Estimated: >60 mL/min (ref >60)
Glucose, Bld: 99 mg/dL (ref 70–99)
Potassium: 3.6 mmol/L (ref 3.5–5.1)
Sodium: 136 mmol/L (ref 135–145)
Total Bilirubin: 0.7 mg/dL (ref 0.3–1.2)
Total Protein: 7.6 g/dL (ref 6.5–8.1)

## 2021-09-06 LAB — URINALYSIS, ROUTINE W REFLEX MICROSCOPIC
Bilirubin Urine: NEGATIVE
Glucose, UA: NEGATIVE mg/dL
Ketones, ur: NEGATIVE mg/dL
Leukocytes,Ua: NEGATIVE
Nitrite: NEGATIVE
Protein, ur: NEGATIVE mg/dL
Specific Gravity, Urine: 1.006 (ref 1.005–1.030)
pH: 6 (ref 5.0–8.0)

## 2021-09-06 LAB — TROPONIN I (HIGH SENSITIVITY)
Troponin I (High Sensitivity): 133 ng/L (ref <18)
Troponin I (High Sensitivity): 94 ng/L — ABNORMAL HIGH (ref <18)

## 2021-09-06 LAB — CBG MONITORING, ED: Glucose-Capillary: 108 mg/dL — ABNORMAL HIGH (ref 70–99)

## 2021-09-06 LAB — ETHANOL: Alcohol, Ethyl (B): <10 mg/dL (ref <10)

## 2021-09-06 LAB — RAPID URINE DRUG SCREEN, HOSP PERFORMED
Amphetamines: NOT DETECTED
Barbiturates: NOT DETECTED
Benzodiazepines: NOT DETECTED
Cocaine: NOT DETECTED
Opiates: NOT DETECTED
Tetrahydrocannabinol: NOT DETECTED

## 2021-09-06 LAB — PROTIME-INR
INR: 1.6 — ABNORMAL HIGH (ref 0.8–1.2)
Prothrombin Time: 19.4 seconds — ABNORMAL HIGH (ref 11.4–15.2)

## 2021-09-06 LAB — RESP PANEL BY RT-PCR (FLU A&B, COVID) ARPGX2
Influenza A by PCR: NEGATIVE
Influenza B by PCR: NEGATIVE
SARS Coronavirus 2 by RT PCR: NEGATIVE

## 2021-09-06 LAB — APTT: aPTT: 29 seconds (ref 24–36)

## 2021-09-06 NOTE — ED Notes (Signed)
Patient transported to MRI 

## 2021-09-06 NOTE — ED Provider Notes (Addendum)
?[place] ?Provider Note ? ? ?CSN: [id number] ?Arrival date & time: 09/06/21  1124 ? ?  ? ?History ? ?Chief Complaint  ?Patient presents with  ? Chest Pain  ? Neurologic Problem  ? ? ?Samantha Vega is a 71 y.o. female. ? ?Patient with a complaint of left-sided weakness for 2 days.  Also with a complaint of left shoulder pain.  Patient denies any anterior chest pain or pressure or discomfort.  The left shoulder hurts when she moves it.  No fall or injury.  States that that pains been present for 2 days as well.  Patient denies any history of prior strokes.  Patient states that he has difficulty moving her left leg and difficulty moving the left arm.  There is pain in that shoulder.  Patient is on Coumadin and is taking it.  In addition patient's had a slight headache.  But not severe. ? ?Past medical history significant right bundle branch block obesity hyperlipidemia hypertension diabetes atrial fibrillation metabolic syndrome X.  Patient's had a total abdominal hysterectomy shoulder surgery in 2006 post trauma of the left shoulder. ? ? ?  ? ?Home Medications ?Prior to Admission medications   ?Medication Sig Start Date End Date Taking? Authorizing Provider  ?atorvastatin (LIPITOR) 80 MG tablet TAKE 1 TABLET BY MOUTH EVERY DAY ?Patient taking differently: Take 80 mg by mouth daily. 07/22/21  Yes Fayrene Helper, MD  ?benazepril (LOTENSIN) 20 MG tablet TAKE 1 TABLET BY MOUTH EVERY DAY ?Patient taking differently: Take 20 mg by mouth daily. 05/27/21  Yes Fayrene Helper, MD  ?diltiazem (CARDIZEM CD) 120 MG 24 hr capsule TAKE 1 CAPSULE BY MOUTH EVERY DAY ?Patient taking differently: Take 120 mg by mouth daily. 12/18/20  Yes Fayrene Helper, MD  ?furosemide (LASIX) 20 MG tablet TAKE 1 TABLET (20 MG TOTAL) BY MOUTH DAILY AS NEEDED FOR FLUID. 04/11/21  Yes Branch, Alphonse Guild, MD  ?metFORMIN (GLUCOPHAGE-XR) 500 MG 24 hr tablet TAKE 1 TABLET BY MOUTH EVERY DAY WITH BREAKFAST ?Patient taking  differently: Take 500 mg by mouth daily with breakfast. 08/19/21  Yes Fayrene Helper, MD  ?metoprolol tartrate (LOPRESSOR) 100 MG tablet TAKE 1 TABLET BY MOUTH TWICE A DAY ?Patient taking differently: Take 100 mg by mouth daily. 02/19/21  Yes Fayrene Helper, MD  ?montelukast (SINGULAIR) 10 MG tablet TAKE 1 TABLET BY MOUTH EVERYDAY AT BEDTIME ?Patient taking differently: Take 10 mg by mouth at bedtime. 07/29/21  Yes Fayrene Helper, MD  ?Vitamin D, Cholecalciferol, 25 MCG (1000 UT) CAPS Take 1,000 Units by mouth daily.   Yes [provider]  ?warfarin (COUMADIN) 5 MG tablet TAKE 1 TABLET DAILY EXCEPT 1/2 TABLET ON SUNDAY AND THURSDAY OR AS DIRECTED BY COUMADIN CLINIC ?Patient taking differently: Take 2.5-5 mg by mouth See admin instructions. 1 TABLET DAILY EXCEPT 1/2 TABLET ON SUNDAY AND THURSDAY 10/01/20  Yes Fayrene Helper, MD  ?polyethylene glycol-electrolytes (TRILYTE) 420 g solution Take 4,000 mLs by mouth as directed. ?Patient not taking: Reported on 09/06/2021 01/01/21   Eloise Harman, DO  ?   ? ?Allergies    ?Patient has no known allergies.   ? ?Review of Systems   ?Review of Systems  ?Constitutional:  Negative for chills and fever.  ?HENT:  Negative for ear pain and sore throat.   ?Eyes:  Negative for pain and visual disturbance.  ?Respiratory:  Negative for cough and shortness of breath.   ?Cardiovascular:  Negative for chest pain and  palpitations.  ?Gastrointestinal:  Negative for abdominal pain and vomiting.  ?Genitourinary:  Negative for dysuria and hematuria.  ?Musculoskeletal:  Negative for arthralgias and back pain.  ?Skin:  Negative for color change and rash.  ?Neurological:  Positive for weakness and headaches. Negative for dizziness, tremors, seizures, syncope, facial asymmetry, light-headedness and numbness.  ?All other systems reviewed and are negative. ? ?Physical Exam ?Updated Vital Signs ?BP 113/75   Pulse 89   Temp 99.2 ?F (37.3 ?C) (Oral)   Resp 20   Ht 1.575 m  ('5\' 2"'$ )   Wt 90.7 kg   SpO2 98%   BMI 36.58 kg/m?  ?Physical Exam ?Vitals and nursing note reviewed.  ?Constitutional:   ?   General: She is not in acute distress. ?   Appearance: Normal appearance. She is well-developed. She is not ill-appearing.  ?HENT:  ?   Head: Normocephalic and atraumatic.  ?Eyes:  ?   Extraocular Movements: Extraocular movements intact.  ?   Conjunctiva/sclera: Conjunctivae normal.  ?   Pupils: Pupils are equal, round, and reactive to light.  ?Cardiovascular:  ?   Rate and Rhythm: Normal rate and regular rhythm.  ?   Heart sounds: No murmur heard. ?Pulmonary:  ?   Effort: Pulmonary effort is normal. No respiratory distress.  ?   Breath sounds: Normal breath sounds.  ?Abdominal:  ?   Palpations: Abdomen is soft.  ?   Tenderness: There is no abdominal tenderness.  ?Musculoskeletal:     ?   General: Tenderness present. No swelling or deformity.  ?   Cervical back: Normal range of motion and neck supple.  ?   Comments: No significant lower extremity edema.  Patient with tenderness to palpation to the left shoulder.  Kind of at the [facility] joint area.  No obvious deformity.  Some discomfort with range of motion passively.  ?Skin: ?   General: Skin is warm and dry.  ?   Capillary Refill: Capillary refill takes less than 2 seconds.  ?Neurological:  ?   Mental Status: She is alert and oriented to person, place, and time.  ?   Cranial Nerves: No cranial nerve deficit.  ?   Sensory: No sensory deficit.  ?   Motor: Weakness present.  ?   Comments: Patient with weakness to the left lower extremity and left upper extremity.  ?Psychiatric:     ?   Mood and Affect: Mood normal.  ? ? ?ED Results / Procedures / Treatments   ?Labs ?(all labs ordered are listed, but only abnormal results are displayed) ?Labs Reviewed  ?PROTIME-INR - Abnormal; Notable for the following components:  ?    Result Value  ? Prothrombin Time 19.4 (*)   ? INR 1.6 (*)   ? All other components within normal limits  ?URINALYSIS, ROUTINE W  REFLEX MICROSCOPIC - Abnormal; Notable for the following components:  ? Color, Urine COLORLESS (*)   ? Hgb urine dipstick SMALL (*)   ? Bacteria, UA RARE (*)   ? All other components within normal limits  ?CBG MONITORING, ED - Abnormal; Notable for the following components:  ? Glucose-Capillary 108 (*)   ? All other components within normal limits  ?TROPONIN I (HIGH SENSITIVITY) - Abnormal; Notable for the following components:  ? Troponin I (High Sensitivity) 133 (*)   ? All other components within normal limits  ?RESP PANEL BY RT-PCR (FLU A&B, COVID) ARPGX2  ?CBC  ?COMPREHENSIVE METABOLIC PANEL  ?ETHANOL  ?APTT  ?RAPID URINE DRUG  SCREEN, HOSP PERFORMED  ? ? ?EKG ?EKG Interpretation ? ?Date/Time:  Friday September 06 2021 11:33:23 EDT ?Ventricular Rate:  93 ?PR Interval:    ?QRS Duration: 157 ?QT Interval:  420 ?QTC Calculation: 523 ?R Axis:   -8 ?Text Interpretation: Atrial fibrillation RBBB and LAFB No significant change since last tracing Confirmed by Fredia Sorrow [phone]) on 09/06/2021 11:40:46 AM ? ?Radiology ?No results found. ? ?Procedures ?Procedures  ? ? ?Medications Ordered in ED ?Medications - No data to display ? ?ED Course/ Medical Decision Making/ A&P ?  ?                        ?Medical Decision Making ?Amount and/or Complexity of Data Reviewed ?Labs: ordered. ?Radiology: ordered. ? ? ?Initial concern was for possible stroke last normal was 2 days ago.  With the left upper extremity and left lower extremity weakness.  A little bit confounded by the fact that she has this left shoulder pain.  And that may be preventing her from using the left upper extremity well.  But does not explain the left lower extremity.  Patient is also had a mild headache.  It was not severe. ? ?Based on this activated stroke order set.  Patient on the window for tPA or for any significant intervention. ? ?Patient's initial troponin came back at 133.  Repeat question to her about any chest discomfort she is has not.  Do not  think that shoulder is a cardiac equivalent type pain.  But the troponin is elevated and we will see we will get on delta troponin. ? ?Urine drug screen negative urinalysis with not consistent with urinary tract

## 2021-09-06 NOTE — ED Notes (Signed)
ED Provider at bedside. 

## 2021-09-06 NOTE — ED Notes (Signed)
Patient transported to CT 

## 2021-09-06 NOTE — ED Triage Notes (Addendum)
Patient reports chest pain that started 2 days ago as well as left sided weakness Patient very unsteady on feet and has difficulty moving left leg.  Patient slow to respond and states " I just want to sleep: Patient also complaining of headache that is mild.  Husband reports symptoms have been coming and going x a couple days.  ?

## 2021-09-06 NOTE — Discharge Instructions (Addendum)
RI negative for stroke.  Repeat heart marker was also down.  Recommend following up with Dr. Moshe Cipro.  Also given referral to cardiology.  For the left shoulder pain use the sling for comfort.  And make an appointment to follow-up with Dr. Aline Brochure.  Return for any new or worse symptoms. ? ?Since she had the elevated troponins although downgoing recommend follow-up with cardiology.  They will probably want to do an echocardiogram and maybe consider nonexercise stress test. ?

## 2021-09-10 ENCOUNTER — Ambulatory Visit: Payer: Medicare Other

## 2021-09-10 ENCOUNTER — Other Ambulatory Visit: Payer: Self-pay

## 2021-09-10 LAB — HM DIABETES EYE EXAM

## 2021-09-14 ENCOUNTER — Other Ambulatory Visit: Payer: Self-pay | Admitting: Family Medicine

## 2021-09-18 ENCOUNTER — Ambulatory Visit (INDEPENDENT_AMBULATORY_CARE_PROVIDER_SITE_OTHER): Payer: Medicare Other | Admitting: Family Medicine

## 2021-09-18 ENCOUNTER — Encounter: Payer: Self-pay | Admitting: Family Medicine

## 2021-09-18 VITALS — BP 112/78 | HR 73 | Ht 62.0 in | Wt 195.0 lb

## 2021-09-18 DIAGNOSIS — M25512 Pain in left shoulder: Secondary | ICD-10-CM | POA: Diagnosis not present

## 2021-09-18 DIAGNOSIS — Z09 Encounter for follow-up examination after completed treatment for conditions other than malignant neoplasm: Secondary | ICD-10-CM | POA: Diagnosis not present

## 2021-09-18 DIAGNOSIS — R0602 Shortness of breath: Secondary | ICD-10-CM

## 2021-09-18 MED ORDER — DILTIAZEM HCL ER COATED BEADS 120 MG PO CP24: ORAL_CAPSULE | ORAL | 2 refills | Status: DC

## 2021-09-18 NOTE — Patient Instructions (Addendum)
F/U as before, call if you need me sooner ? ? ? ?You are referred to Dr Aline Brochure and Dr Harl Bowie as we discussed following your recent ED visit ? ?Thanks for choosing [facility], we consider it a privelige to serve you. ? ?

## 2021-09-18 NOTE — Assessment & Plan Note (Signed)
Continues to report increasing exertinal dyspnrea, needs ED f/u also with card ?

## 2021-09-22 ENCOUNTER — Encounter: Payer: Self-pay | Admitting: Family Medicine

## 2021-09-22 DIAGNOSIS — M25512 Pain in left shoulder: Secondary | ICD-10-CM | POA: Insufficient documentation

## 2021-09-22 NOTE — Progress Notes (Signed)
? ?  Samantha Vega     MRN: [id number]      DOB: 04/02/1951 ? ? ?HPI ?[name] is here for follow up of Samantha Vega visit on 09/06/2021 when she presented with acute neurological symptoms and chest pain.  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 ?ROS ?Denies recent fever or chills. ?Denies sinus pressure, nasal congestion, ear pain or sore throat. ?Denies chest congestion, productive cough or wheezing. ?Denies chest pains, palpitations and leg swelling ?Denies abdominal pain, nausea, vomiting,diarrhea or constipation.   ?Denies dysuria, frequency, hesitancy or incontinence. ? ?Denies headaches, seizures, numbness, or tingling. ?Denies depression, anxiety or insomnia. ?Denies skin break down or rash. ? ? ?PE ? ?BP 112/78   Pulse 73   Ht '5\' 2"'$  (1.575 m)   Wt 195 lb (88.5 kg)   SpO2 98%   BMI 35.67 kg/m?  ? ?Patient alert and oriented and in no cardiopulmonary distress. ? ?HEENT: No facial asymmetry, EOMI,     Neck supple . ? ?Chest: Clear to auscultation bilaterally. ? ?CVS: S1, S2 no murmurs, no S3.Regular rate. ? ?ABD: Soft non tender.  ? ?Ext: No edema ? ?MS: Adequate ROM spine, decreased ROM left shoulder. ?Skin: Intact, no ulcerations or rash noted. ? ?Psych: Good eye contact, normal affect. Memory intact not anxious or depressed appearing. ? ?CNS: CN 2-12 intact, power,  normal throughout.no focal deficits noted. ? ? ?Assessment & Plan ? ?Exertional shortness of breath ?Continues to report increasing exertinal dyspnrea, needs Samantha Vega f/u also with card ? ?Encounter for examination following treatment at hospital ?Patient in for follow up of recent Samantha Vega visit ?Discharge summary, and laboratory and radiology data are reviewed, and any  questions or concerns  are discussed. ?Specific issues requiring follow up are specifically addressed. ? ? ?Acute pain of left shoulder ?Unprovoked acute left shoulder pain with limited mobility, ortho eval ? ?

## 2021-09-22 NOTE — Assessment & Plan Note (Signed)
Unprovoked acute left shoulder pain with limited mobility, ortho eval ?

## 2021-09-22 NOTE — Assessment & Plan Note (Signed)
Patient in for follow up of recent ED visit. Discharge summary, and laboratory and radiology data are reviewed, and any questions or concerns  are discussed. Specific issues requiring follow up are specifically addressed.  

## 2021-09-26 ENCOUNTER — Ambulatory Visit (INDEPENDENT_AMBULATORY_CARE_PROVIDER_SITE_OTHER): Payer: Medicare Other | Admitting: *Deleted

## 2021-09-26 DIAGNOSIS — I480 Paroxysmal atrial fibrillation: Secondary | ICD-10-CM

## 2021-09-26 DIAGNOSIS — Z5181 Encounter for therapeutic drug level monitoring: Secondary | ICD-10-CM

## 2021-09-26 LAB — POCT INR: INR: 2.8 (ref 2.0–3.0)

## 2021-09-26 NOTE — Patient Instructions (Signed)
Continue warfarin 1 tablet daily except 1/2 tablet on Tuesdays, Thursdays and Saturdays Recheck in 6 wks 

## 2021-09-27 ENCOUNTER — Ambulatory Visit (INDEPENDENT_AMBULATORY_CARE_PROVIDER_SITE_OTHER): Payer: Medicare Other | Admitting: Orthopedic Surgery

## 2021-09-27 ENCOUNTER — Emergency Department (HOSPITAL_COMMUNITY): Payer: Medicare Other

## 2021-09-27 ENCOUNTER — Emergency Department (HOSPITAL_COMMUNITY)
Admission: EM | Admit: 2021-09-27 | Discharge: 2021-09-27 | Disposition: A | Discharge status: Home / Self Care | Payer: Medicare Other | Attending: Emergency Medicine | Admitting: Emergency Medicine

## 2021-09-27 ENCOUNTER — Encounter: Payer: Self-pay | Admitting: Orthopedic Surgery

## 2021-09-27 ENCOUNTER — Encounter (HOSPITAL_COMMUNITY): Payer: Self-pay | Admitting: Emergency Medicine

## 2021-09-27 ENCOUNTER — Other Ambulatory Visit: Payer: Self-pay

## 2021-09-27 ENCOUNTER — Other Ambulatory Visit: Payer: Self-pay | Admitting: Cardiology

## 2021-09-27 VITALS — Ht 62.0 in | Wt 195.0 lb

## 2021-09-27 DIAGNOSIS — I10 Essential (primary) hypertension: Secondary | ICD-10-CM | POA: Diagnosis not present

## 2021-09-27 DIAGNOSIS — R079 Chest pain, unspecified: Secondary | ICD-10-CM | POA: Diagnosis not present

## 2021-09-27 DIAGNOSIS — R0789 Other chest pain: Secondary | ICD-10-CM | POA: Insufficient documentation

## 2021-09-27 DIAGNOSIS — Z79899 Other long term (current) drug therapy: Secondary | ICD-10-CM | POA: Diagnosis not present

## 2021-09-27 DIAGNOSIS — M25512 Pain in left shoulder: Secondary | ICD-10-CM | POA: Diagnosis not present

## 2021-09-27 DIAGNOSIS — M7582 Other shoulder lesions, left shoulder: Secondary | ICD-10-CM | POA: Diagnosis not present

## 2021-09-27 DIAGNOSIS — Z7984 Long term (current) use of oral hypoglycemic drugs: Secondary | ICD-10-CM | POA: Diagnosis not present

## 2021-09-27 DIAGNOSIS — Z7901 Long term (current) use of anticoagulants: Secondary | ICD-10-CM | POA: Insufficient documentation

## 2021-09-27 DIAGNOSIS — G8929 Other chronic pain: Secondary | ICD-10-CM | POA: Diagnosis not present

## 2021-09-27 DIAGNOSIS — I4891 Unspecified atrial fibrillation: Secondary | ICD-10-CM | POA: Insufficient documentation

## 2021-09-27 DIAGNOSIS — R0602 Shortness of breath: Secondary | ICD-10-CM | POA: Diagnosis not present

## 2021-09-27 DIAGNOSIS — E119 Type 2 diabetes mellitus without complications: Secondary | ICD-10-CM | POA: Insufficient documentation

## 2021-09-27 LAB — COMPREHENSIVE METABOLIC PANEL
ALT: 30 U/L (ref 0–44)
AST: 25 U/L (ref 15–41)
Albumin: 3.8 g/dL (ref 3.5–5.0)
Alkaline Phosphatase: 106 U/L (ref 38–126)
Anion gap: 5 (ref 5–15)
BUN: 21 mg/dL (ref 8–23)
CO2: 26 mmol/L (ref 22–32)
Calcium: 9.2 mg/dL (ref 8.9–10.3)
Chloride: 109 mmol/L (ref 98–111)
Creatinine, Ser: 0.79 mg/dL (ref 0.44–1.00)
GFR, Estimated: >60 mL/min (ref >60)
Glucose, Bld: 85 mg/dL (ref 70–99)
Potassium: 4 mmol/L (ref 3.5–5.1)
Sodium: 140 mmol/L (ref 135–145)
Total Bilirubin: 0.6 mg/dL (ref 0.3–1.2)
Total Protein: 7.9 g/dL (ref 6.5–8.1)

## 2021-09-27 LAB — CBC WITH DIFFERENTIAL/PLATELET
Abs Immature Granulocytes: 0.01 10*3/uL (ref 0.00–0.07)
Basophils Absolute: 0 10*3/uL (ref 0.0–0.1)
Basophils Relative: 1 %
Eosinophils Absolute: 0.1 10*3/uL (ref 0.0–0.5)
Eosinophils Relative: 3 %
HCT: 40.1 % (ref 36.0–46.0)
Hemoglobin: 13.4 g/dL (ref 12.0–15.0)
Immature Granulocytes: 0 %
Lymphocytes Relative: 36 %
Lymphs Abs: 1.5 10*3/uL (ref 0.7–4.0)
MCH: 29.4 pg (ref 26.0–34.0)
MCHC: 33.4 g/dL (ref 30.0–36.0)
MCV: 87.9 fL (ref 80.0–100.0)
Monocytes Absolute: 0.3 10*3/uL (ref 0.1–1.0)
Monocytes Relative: 8 %
Neutro Abs: 2.1 10*3/uL (ref 1.7–7.7)
Neutrophils Relative %: 52 %
Platelets: 169 10*3/uL (ref 150–400)
RBC: 4.56 MIL/uL (ref 3.87–5.11)
RDW: 15.1 % (ref 11.5–15.5)
WBC: 4 10*3/uL (ref 4.0–10.5)
nRBC: 0 % (ref 0.0–0.2)

## 2021-09-27 LAB — URINALYSIS, ROUTINE W REFLEX MICROSCOPIC
Bilirubin Urine: NEGATIVE
Glucose, UA: NEGATIVE mg/dL
Hgb urine dipstick: NEGATIVE
Ketones, ur: NEGATIVE mg/dL
Leukocytes,Ua: NEGATIVE
Nitrite: NEGATIVE
Protein, ur: NEGATIVE mg/dL
Specific Gravity, Urine: 1.025 (ref 1.005–1.030)
pH: 5 (ref 5.0–8.0)

## 2021-09-27 LAB — TROPONIN I (HIGH SENSITIVITY)
Troponin I (High Sensitivity): 3 ng/L (ref <18)
Troponin I (High Sensitivity): 3 ng/L (ref <18)

## 2021-09-27 NOTE — Patient Instructions (Addendum)
Tylenol 500 mg take 1 every 6 hrs for pain  ? ?Rub ben gay on the shoulder  ? ?Apply heating pad to left hip and lower back 30 minutes at a time every 2-3 hours until the pain subsides ? ?You have received an injection of steroids into the joint. 15% of patients will have increased pain within the 24 hours postinjection.  ? ?This is transient and will go away.  ? ?We recommend that you use ice packs on the injection site for 20 minutes every 2 hours and extra strength Tylenol 2 tablets every 8 as needed until the pain resolves. ? ?If you continue to have pain after taking the Tylenol and using the ice please call the office for further instructions. ? ?Start therapy  ? ? ?

## 2021-09-27 NOTE — Progress Notes (Signed)
Chief Complaint  ?Patient presents with  ? Shoulder Pain  ?  Lt shoulder getting worse over the past 3 months  ? ?71 year old female with recent cardiac arrhythmias presents with recurrent left shoulder pain also complaining of some left thigh pain ? ?Also says she is very tired ? ?She is on warfarin for atrial fibrillation denies chest pain or shortness of breath just says I am tired ? ?She has good strength in her left shoulder in abduction and flexion, but has pain with resisted motor testing ? ? ? ?Her left thigh seems to be bothering her as well is her left hip area ? ?We will go ahead and inject her left shoulder.  We will recommend heating pad for the left hip ? ?Procedure note the subacromial injection shoulder left  ? ?Verbal consent was obtained to inject the  Left   Shoulder ? ?Timeout was completed to confirm the injection site is a subacromial space of the  left  shoulder ? ?Medication used Depo-Medrol 40 mg and lidocaine 1% 3 cc ? ?Anesthesia was provided by ethyl chloride ? ?The injection was performed in the left  posterior subacromial space. After pinning the skin with alcohol and anesthetized the skin with ethyl chloride the subacromial space was injected using a 20-gauge needle. There were no complications ? ?Sterile dressing was applied. ? ?Patient will be sent for physical therapy of the left shoulder ? ?Follow-up as needed ? ?Note prior x-rays show no arthritis of the joint clinical exam shows rotator cuff to be intact ? ? ?Encounter Diagnoses  ?Name Primary?  ? Chronic left shoulder pain Yes  ? Rotator cuff tendinitis, left   ? ? ? ? ?This patient was sent to the emergency room as she became very diaphoretic short of breath and tired with an underlying history of atrial fibrillation on warfarin ? ?I advised to go to the emergency room immediately ? ?The shoulder was injected and I advised physical therapy ? ?She is on warfarin so I advised topical rubs such as Bengay and then extra strength  Tylenol 500 mg every 6 ? ?She is not a surgical candidate ? ?  ?

## 2021-09-27 NOTE — ED Triage Notes (Signed)
Pt to the ED after a doctor's appt with shortness of breath and chest pain.  ?

## 2021-09-27 NOTE — Discharge Instructions (Signed)
Please continue with all home medication as prescribed ? ? ?Please follow-up with Dr. Harl Bowie for further evaluation ? ?Come back to the emergency department if you develop chest pain, shortness of breath, severe abdominal pain, uncontrolled nausea, vomiting, diarrhea. ? ?

## 2021-09-27 NOTE — ED Provider Notes (Signed)
?[place] ?Provider Note ? ? ?CSN: [id number] ?Arrival date & time: 09/27/21  1009 ? ?  ? ?History ? ?Chief Complaint  ?Patient presents with  ? Shortness of Breath  ? ? ?Samantha Vega is a 71 y.o. female. ? ?HPI ? ?Patient with medical history including hypertension, diabetes, chronic PE, A-fib currently on Coumadin presents  with complaints of chest pain and shortness of breath.  Patient states that she was at the orthopedic see Dr. Aline Brochure for a joint injection of her left shoulder where she is started to have chest pain became sweaty and more fatigued.  She was sent here via EMS for reevaluation.  She states that she was having some chest pain which is in the middle of her chest she says this is chronic for her, she denies any shortness of breath or pleuritic chest pain, she denies any lightheaded dizziness headache change in vision paresthesia or weakness prior to the incident or currently at this moment.  She was only complaint 3 states she been compliant with all her medications. ? ?Husband is at bedside able to validate the story, I reviewed patient's chart she is followed by Dr. Harl Bowie currently on Cardizem and metoprolol for rate and rhythm control.  Reviewed Dr. Ruthe Mannan notes from today but states that after steroid injection patient become hot sweaty and weak and was sent here for further evaluation. ? ?Home Medications ?Prior to Admission medications   ?Medication Sig Start Date End Date Taking? Authorizing Provider  ?atorvastatin (LIPITOR) 80 MG tablet TAKE 1 TABLET BY MOUTH EVERY DAY ?Patient taking differently: Take 80 mg by mouth daily. 07/22/21  Yes Fayrene Helper, MD  ?benazepril (LOTENSIN) 20 MG tablet TAKE 1 TABLET BY MOUTH EVERY DAY ?Patient taking differently: Take 20 mg by mouth daily. 05/27/21  Yes Fayrene Helper, MD  ?diltiazem (CARDIZEM CD) 120 MG 24 hr capsule TAKE 1 CAPSULE BY MOUTH EVERY DAY 09/18/21  Yes Fayrene Helper, MD  ?furosemide (LASIX)  20 MG tablet TAKE 1 TABLET (20 MG TOTAL) BY MOUTH DAILY AS NEEDED FOR FLUID. 09/27/21  Yes Branch, Alphonse Guild, MD  ?metFORMIN (GLUCOPHAGE-XR) 500 MG 24 hr tablet TAKE 1 TABLET BY MOUTH EVERY DAY WITH BREAKFAST ?Patient taking differently: Take 500 mg by mouth daily with breakfast. 08/19/21  Yes Fayrene Helper, MD  ?montelukast (SINGULAIR) 10 MG tablet TAKE 1 TABLET BY MOUTH EVERYDAY AT BEDTIME ?Patient taking differently: Take 10 mg by mouth at bedtime. 07/29/21  Yes Fayrene Helper, MD  ?warfarin (COUMADIN) 5 MG tablet TAKE 1 TABLET DAILY EXCEPT 1/2 TABLET ON SUNDAY AND THURSDAY OR AS DIRECTED BY COUMADIN CLINIC ?Patient taking differently: Take 2.5-5 mg by mouth See admin instructions. 1 TABLET DAILY EXCEPT 1/2 TABLET ON SUNDAY AND THURSDAY 10/01/20  Yes Fayrene Helper, MD  ?metoprolol tartrate (LOPRESSOR) 100 MG tablet TAKE 1 TABLET BY MOUTH TWICE A DAY ?Patient not taking: Reported on 09/27/2021 02/19/21   Fayrene Helper, MD  ?polyethylene glycol-electrolytes (TRILYTE) 420 g solution Take 4,000 mLs by mouth as directed. ?Patient not taking: Reported on 09/27/2021 01/01/21   Eloise Harman, DO  ?   ? ?Allergies    ?Patient has no known allergies.   ? ?Review of Systems   ?Review of Systems  ?Constitutional:  Negative for chills and fever.  ?Respiratory:  Negative for shortness of breath.   ?Cardiovascular:  Positive for chest pain.  ?Gastrointestinal:  Negative for abdominal pain.  ?Neurological:  Negative for headaches.  ? ?  Physical Exam ?Updated Vital Signs ?BP (!) 128/96   Pulse 98   Temp (!) 97.3 ?F (36.3 ?C) (Oral)   Resp (!) 21   Ht '5\' 2"'$  (1.575 m)   Wt 88.5 kg   SpO2 99%   BMI 35.67 kg/m?  ?Physical Exam ?Vitals and nursing note reviewed.  ?Constitutional:   ?   General: She is not in acute distress. ?   Appearance: She is not ill-appearing.  ?HENT:  ?   Head: Normocephalic and atraumatic.  ?   Nose: No congestion.  ?Eyes:  ?   Conjunctiva/sclera: Conjunctivae normal.  ?Cardiovascular:   ?   Rate and Rhythm: Normal rate and regular rhythm.  ?   Pulses: Normal pulses.  ?   Heart sounds: No murmur heard. ?  No friction rub. No gallop.  ?Pulmonary:  ?   Effort: No respiratory distress.  ?   Breath sounds: No wheezing, rhonchi or rales.  ?Abdominal:  ?   Palpations: Abdomen is soft.  ?   Tenderness: There is no abdominal tenderness. There is no right CVA tenderness or left CVA tenderness.  ?Musculoskeletal:  ?   Right lower leg: No edema.  ?   Left lower leg: No edema.  ?Skin: ?   General: Skin is warm and dry.  ?Neurological:  ?   Mental Status: She is alert.  ?   Cranial Nerves: No cranial nerve deficit.  ?   Sensory: Sensation is intact.  ?   Motor: No weakness.  ?   Coordination: Romberg sign negative.  ?   Comments: Cranial nerves II through XII grossly intact, no difficulty word finding, a follow two-step commands, no unilateral weakness present.  ?Psychiatric:     ?   Mood and Affect: Mood normal.  ? ? ?ED Results / Procedures / Treatments   ?Labs ?(all labs ordered are listed, but only abnormal results are displayed) ?Labs Reviewed  ?COMPREHENSIVE METABOLIC PANEL  ?CBC WITH DIFFERENTIAL/PLATELET  ?URINALYSIS, ROUTINE W REFLEX MICROSCOPIC  ?TROPONIN I (HIGH SENSITIVITY)  ?TROPONIN I (HIGH SENSITIVITY)  ? ? ?EKG ?EKG Interpretation ? ?Date/Time:  Friday September 27 2021 10:29:26 EDT ?Ventricular Rate:  93 ?PR Interval:    ?QRS Duration: 150 ?QT Interval:  409 ?QTC Calculation: 509 ?R Axis:   250 ?Text Interpretation: Atrial fibrillation Right bundle branch block Inferior infarct, old No significant change since last tracing Confirmed by Fredia Sorrow [phone]) on 09/27/2021 10:36:41 AM ? ?Radiology ?DG Chest 2 View ? ?Result Date: 09/27/2021 ?CLINICAL DATA:  Shortness of breath. Chest pain. Atrial fibrillation. EXAM: CHEST - 2 VIEW COMPARISON:  AP chest 09/06/2021 FINDINGS: Heart size is again moderately enlarged. Mediastinal contours are within normal limits with mild calcification seen within the  aortic arch. The lungs are clear. No pleural effusion or pneumothorax. Minimal anterior wedging of the T6 vertebral body, unchanged from 03/19/2021 CT. IMPRESSION: No active cardiopulmonary disease. Electronically Signed   By: Yvonne Kendall M.D.   On: 09/27/2021 12:14   ? ?Procedures ?Procedures  ? ? ?Medications Ordered in ED ?Medications - No data to display ? ?ED Course/ Medical Decision Making/ A&P ?  ?                        ?Medical Decision Making ?Amount and/or Complexity of Data Reviewed ?Labs: ordered. ?Radiology: ordered. ? ? ?This patient presents to the ED for concern of chest pain, this involves an extensive number of treatment options, and is a complaint  that carries with it a high risk of complications and morbidity.  The differential diagnosis includes ACS, PE, CVA, TIA ? ? ? ?Additional history obtained: ? ?Additional history obtained from husband who is at bedside ?External records from outside source obtained and reviewed including orthopedic notes, previous cardiology notes ? ? ?Co morbidities that complicate the patient evaluation ? ?A-fib, hypertension, diabetes ? ?Social Determinants of Health: ? ?Geriatric-further contact arranged for Dr. Nelly Laurence office for to follow-up with. ? ? ? ?Lab Tests: ? ?I Ordered, and personally interpreted labs.  The pertinent results include: CBC unremarkable, CMP unremarkable, UA unremarkable, negative delta troponin. ? ? ?Imaging Studies ordered: ? ?I ordered imaging studies including chest x-ray ?I independently visualized and interpreted imaging which showed unremarkable ?I agree with the radiologist interpretation ? ? ?Cardiac Monitoring: ? ?The patient was maintained on a cardiac monitor.  I personally viewed and interpreted the cardiac monitored which showed an underlying rhythm of: A-fib without signs of ischemia ? ? ?Medicines ordered and prescription drug management: ? ?I ordered medication including N/A ?I have reviewed the patients home medicines and  have made adjustments as needed ? ?Critical Interventions: ? ?N/A ? ? ?Reevaluation: ? ?Presents with diaphoresis and chest pain, unclear etiology, will obtain basic lab work-up and reassess ? ?Patient was rea

## 2021-10-08 ENCOUNTER — Other Ambulatory Visit: Payer: Self-pay | Admitting: Family Medicine

## 2021-10-15 ENCOUNTER — Other Ambulatory Visit: Payer: Self-pay

## 2021-10-15 ENCOUNTER — Emergency Department (HOSPITAL_COMMUNITY): Payer: Medicare Other

## 2021-10-15 ENCOUNTER — Emergency Department (HOSPITAL_COMMUNITY)
Admission: EM | Admit: 2021-10-15 | Discharge: 2021-10-15 | Disposition: A | Discharge status: Home / Self Care | Payer: Medicare Other | Attending: Emergency Medicine | Admitting: Emergency Medicine

## 2021-10-15 ENCOUNTER — Encounter (HOSPITAL_COMMUNITY): Payer: Self-pay

## 2021-10-15 DIAGNOSIS — I499 Cardiac arrhythmia, unspecified: Secondary | ICD-10-CM | POA: Diagnosis not present

## 2021-10-15 DIAGNOSIS — R21 Rash and other nonspecific skin eruption: Secondary | ICD-10-CM | POA: Diagnosis not present

## 2021-10-15 DIAGNOSIS — R0600 Dyspnea, unspecified: Secondary | ICD-10-CM | POA: Insufficient documentation

## 2021-10-15 DIAGNOSIS — R0602 Shortness of breath: Secondary | ICD-10-CM | POA: Diagnosis not present

## 2021-10-15 DIAGNOSIS — Z7901 Long term (current) use of anticoagulants: Secondary | ICD-10-CM | POA: Diagnosis not present

## 2021-10-15 DIAGNOSIS — R0789 Other chest pain: Secondary | ICD-10-CM | POA: Diagnosis not present

## 2021-10-15 LAB — BASIC METABOLIC PANEL
Anion gap: 7 (ref 5–15)
BUN: 27 mg/dL — ABNORMAL HIGH (ref 8–23)
CO2: 23 mmol/L (ref 22–32)
Calcium: 9.6 mg/dL (ref 8.9–10.3)
Chloride: 106 mmol/L (ref 98–111)
Creatinine, Ser: 1.09 mg/dL — ABNORMAL HIGH (ref 0.44–1.00)
GFR, Estimated: 54 mL/min — ABNORMAL LOW (ref >60)
Glucose, Bld: 103 mg/dL — ABNORMAL HIGH (ref 70–99)
Potassium: 3.7 mmol/L (ref 3.5–5.1)
Sodium: 136 mmol/L (ref 135–145)

## 2021-10-15 LAB — TROPONIN I (HIGH SENSITIVITY)
Troponin I (High Sensitivity): 4 ng/L (ref <18)
Troponin I (High Sensitivity): 4 ng/L (ref <18)

## 2021-10-15 LAB — CBC
HCT: 41.5 % (ref 36.0–46.0)
Hemoglobin: 13.6 g/dL (ref 12.0–15.0)
MCH: 28.3 pg (ref 26.0–34.0)
MCHC: 32.8 g/dL (ref 30.0–36.0)
MCV: 86.5 fL (ref 80.0–100.0)
Platelets: 200 10*3/uL (ref 150–400)
RBC: 4.8 MIL/uL (ref 3.87–5.11)
RDW: 14.8 % (ref 11.5–15.5)
WBC: 5.8 10*3/uL (ref 4.0–10.5)
nRBC: 0 % (ref 0.0–0.2)

## 2021-10-15 LAB — HEPATIC FUNCTION PANEL
ALT: 21 U/L (ref 0–44)
AST: 21 U/L (ref 15–41)
Albumin: 3.8 g/dL (ref 3.5–5.0)
Alkaline Phosphatase: 99 U/L (ref 38–126)
Bilirubin, Direct: 0.1 mg/dL (ref 0.0–0.2)
Indirect Bilirubin: 0.4 mg/dL (ref 0.3–0.9)
Total Bilirubin: 0.5 mg/dL (ref 0.3–1.2)
Total Protein: 8 g/dL (ref 6.5–8.1)

## 2021-10-15 LAB — BRAIN NATRIURETIC PEPTIDE: B Natriuretic Peptide: 107 pg/mL — ABNORMAL HIGH (ref 0.0–100.0)

## 2021-10-15 MED ORDER — DILTIAZEM HCL ER COATED BEADS 120 MG PO CP24
120.0000 mg | ORAL_CAPSULE | Freq: Every day | ORAL | Status: DC
  Administered 2021-10-15: 120 mg via ORAL
  Filled 2021-10-15: qty 1

## 2021-10-15 MED ORDER — CEPHALEXIN 500 MG PO CAPS: 500.0000 mg | ORAL_CAPSULE | Freq: Four times a day (QID) | ORAL | 0 refills | Status: DC

## 2021-10-15 MED ORDER — SODIUM CHLORIDE 0.9 % IV BOLUS
250.0000 mL | Freq: Once | INTRAVENOUS | Status: AC
  Administered 2021-10-15: 250 mL via INTRAVENOUS

## 2021-10-15 MED ORDER — DILTIAZEM HCL-DEXTROSE 125-5 MG/125ML-% IV SOLN (PREMIX)
5.0000 mg/h | INTRAVENOUS | Status: DC
  Administered 2021-10-15: 5 mg/h via INTRAVENOUS
  Filled 2021-10-15: qty 125

## 2021-10-15 NOTE — ED Triage Notes (Signed)
Patient with complaints of shortness of breath for 3 days.  ?

## 2021-10-15 NOTE — ED Provider Notes (Signed)
?[facility] ?Provider Note ? ? ?CSN: [id number] ?Arrival date & time: 10/15/21  1047 ? ?  ? ?History ? ?Chief Complaint  ?Patient presents with  ? Shortness of Breath  ? ? ?Samantha Vega is a 71 y.o. female. ? ?Patient complains of some shortness of breath and a soreness to her chest.  History of atrial fibs ? ?The history is provided by the patient. No language interpreter was used.  ?Shortness of Breath ?Severity:  Mild ?Onset quality:  Sudden ?Timing:  Intermittent ?Progression:  Waxing and waning ?Chronicity:  Recurrent ?Context: activity   ?Relieved by:  Nothing ?Worsened by:  Nothing ?Ineffective treatments:  None tried ?Associated symptoms: rash   ?Associated symptoms: no abdominal pain, no chest pain, no cough and no headaches   ? ?  ? ?Home Medications ?Prior to Admission medications   ?Medication Sig Start Date End Date Taking? Authorizing Provider  ?cephALEXin (KEFLEX) 500 MG capsule Take 1 capsule (500 mg total) by mouth 4 (four) times daily. 10/15/21  Yes Milton Ferguson, MD  ?atorvastatin (LIPITOR) 80 MG tablet TAKE 1 TABLET BY MOUTH EVERY DAY ?Patient taking differently: Take 80 mg by mouth daily. 07/22/21   Fayrene Helper, MD  ?benazepril (LOTENSIN) 20 MG tablet TAKE 1 TABLET BY MOUTH EVERY DAY ?Patient taking differently: Take 20 mg by mouth daily. 05/27/21   Fayrene Helper, MD  ?diltiazem (CARDIZEM CD) 120 MG 24 hr capsule TAKE 1 CAPSULE BY MOUTH EVERY DAY 09/18/21   Fayrene Helper, MD  ?furosemide (LASIX) 20 MG tablet TAKE 1 TABLET (20 MG TOTAL) BY MOUTH DAILY AS NEEDED FOR FLUID. 09/27/21   Arnoldo Lenis, MD  ?metFORMIN (GLUCOPHAGE-XR) 500 MG 24 hr tablet TAKE 1 TABLET BY MOUTH EVERY DAY WITH BREAKFAST ?Patient taking differently: Take 500 mg by mouth daily with breakfast. 08/19/21   Fayrene Helper, MD  ?metoprolol tartrate (LOPRESSOR) 100 MG tablet TAKE 1 TABLET BY MOUTH TWICE A DAY ?Patient not taking: Reported on 09/27/2021 02/19/21   Fayrene Helper, MD   ?montelukast (SINGULAIR) 10 MG tablet TAKE 1 TABLET BY MOUTH EVERYDAY AT BEDTIME ?Patient taking differently: Take 10 mg by mouth at bedtime. 07/29/21   Fayrene Helper, MD  ?polyethylene glycol-electrolytes (TRILYTE) 420 g solution Take 4,000 mLs by mouth as directed. ?Patient not taking: Reported on 09/27/2021 01/01/21   Eloise Harman, DO  ?warfarin (COUMADIN) 5 MG tablet TAKE 1 TABLET DAILY EXCEPT 1/2 TABLET ON SUNDAY AND THURSDAY OR AS DIRECTED BY COUMADIN CLINIC 10/08/21   Fayrene Helper, MD  ?   ? ?Allergies    ?Patient has no known allergies.   ? ?Review of Systems   ?Review of Systems  ?Constitutional:  Negative for appetite change and fatigue.  ?HENT:  Negative for congestion, ear discharge and sinus pressure.   ?Eyes:  Negative for discharge.  ?Respiratory:  Positive for shortness of breath. Negative for cough.   ?Cardiovascular:  Negative for chest pain.  ?Gastrointestinal:  Negative for abdominal pain and diarrhea.  ?Genitourinary:  Negative for frequency and hematuria.  ?Musculoskeletal:  Negative for back pain.  ?Skin:  Positive for rash.  ?Neurological:  Negative for seizures and headaches.  ?Psychiatric/Behavioral:  Negative for hallucinations.   ? ?Physical Exam ?Updated Vital Signs ?BP 119/90   Pulse 88   Temp 98 ?F (36.7 ?C) (Oral)   Resp (!) 21   Ht '5\' 2"'$  (1.575 m)   Wt 88.5 kg   SpO2 99%  BMI 35.67 kg/m?  ?Physical Exam ?Vitals and nursing note reviewed.  ?Constitutional:   ?   Appearance: She is well-developed.  ?HENT:  ?   Head: Normocephalic.  ?   Mouth/Throat:  ?   Mouth: Mucous membranes are moist.  ?Eyes:  ?   General: No scleral icterus. ?   Conjunctiva/sclera: Conjunctivae normal.  ?Neck:  ?   Thyroid: No thyromegaly.  ?Cardiovascular:  ?   Rate and Rhythm: Tachycardia present. Rhythm irregular.  ?   Heart sounds: No murmur heard. ?  No friction rub. No gallop.  ?Pulmonary:  ?   Breath sounds: No stridor. No wheezing or rales.  ?Chest:  ?   Chest wall: No tenderness.   ?Abdominal:  ?   General: There is no distension.  ?   Tenderness: There is no abdominal tenderness. There is no rebound.  ?Musculoskeletal:     ?   General: Normal range of motion.  ?   Cervical back: Neck supple.  ?Lymphadenopathy:  ?   Cervical: No cervical adenopathy.  ?Skin: ?   Findings: Erythema present. No rash.  ?   Comments: Inflammation to right anterior chest possible cellulitis  ?Neurological:  ?   Mental Status: She is alert and oriented to person, place, and time.  ?   Motor: No abnormal muscle tone.  ?   Coordination: Coordination normal.  ?Psychiatric:     ?   Behavior: Behavior normal.  ? ? ?ED Results / Procedures / Treatments   ?Labs ?(all labs ordered are listed, but only abnormal results are displayed) ?Labs Reviewed  ?BASIC METABOLIC PANEL - Abnormal; Notable for the following components:  ?    Result Value  ? Glucose, Bld 103 (*)   ? BUN 27 (*)   ? Creatinine, Ser 1.09 (*)   ? GFR, Estimated 54 (*)   ? All other components within normal limits  ?BRAIN NATRIURETIC PEPTIDE - Abnormal; Notable for the following components:  ? B Natriuretic Peptide 107.0 (*)   ? All other components within normal limits  ?CBC  ?HEPATIC FUNCTION PANEL  ?TROPONIN I (HIGH SENSITIVITY)  ?TROPONIN I (HIGH SENSITIVITY)  ? ? ?EKG ?None ? ?Radiology ?DG Chest 2 View ? ?Result Date: 10/15/2021 ?CLINICAL DATA:  Shortness of breath. EXAM: CHEST - 2 VIEW COMPARISON:  09/27/2021 FINDINGS: Stable top-normal heart size. Stable elevation of the right hemidiaphragm. There is no evidence of pulmonary edema, consolidation, pneumothorax, nodule or pleural fluid. The visualized skeletal structures are unremarkable. IMPRESSION: No active cardiopulmonary disease. Electronically Signed   By: Aletta Edouard M.D.   On: 10/15/2021 11:50   ? ?Procedures ?Procedures  ? ? ?Medications Ordered in ED ?Medications  ?diltiazem (CARDIZEM) 125 mg in dextrose 5% 125 mL (1 mg/mL) infusion (5 mg/hr Intravenous New Bag/Given 10/15/21 1210)  ?diltiazem  (CARDIZEM CD) 24 hr capsule 120 mg (has no administration in time range)  ?sodium chloride 0.9 % bolus 250 mL (0 mLs Intravenous Stopped 10/15/21 1303)  ? ? ?ED Course/ Medical Decision Making/ A&P ?  ?                        ?Medical Decision Making ?Amount and/or Complexity of Data Reviewed ?Labs: ordered. ?Radiology: ordered. ? ?Risk ?Prescription drug management. ? ?This patient presents to the ED for concern of shortness of breath and irritation to anterior chest, this involves an extensive number of treatment options, and is a complaint that carries with it a high risk  of complications and morbidity.  The differential diagnosis includes rapid atrial fibs, pneumonia, congestive heart ? ? ?Co morbidities that complicate the patient evaluation ? ?Atrial fibs ? ? ?Additional history obtained: ? ?Additional history obtained from relative ?External records from outside source obtained and reviewed including hospital records ? ? ?Lab Tests: ? ?I Ordered, and personally interpreted labs.  The pertinent results include: Troponin that was negative.  CBC normal ? ? ?Imaging Studies ordered: ? ?I ordered imaging studies including chest x-ray ?I independently visualized and interpreted imaging which showed unremarkable ?I agree with the radiologist interpretation ? ? ?Cardiac Monitoring: / EKG: ? ?The patient was maintained on a cardiac monitor.  I personally viewed and interpreted the cardiac monitored which showed an underlying rhythm of: Rapid atrial fibs ? ? ?Consultations Obtained: ? ?No consult ?Problem List / ED Course / Critical interventions / Medication management ? ?Atrial fibs and irritation to right chest ?I ordered medication including Cardizem ?Reevaluation of the patient after these medicines showed that the patient improved ?I have reviewed the patients home medicines and have made adjustments as needed ? ? ?Social Determinants of Health: ? ?None ? ? ?Test / Admission - Considered: ? ?None ? ?Patient with  rapid atrial fibs that was controlled with Cardizem.  She did not take her medicine today.  Patient also with small skin infection she  will be placed on Keflex and was given Cardizem and she improved and will fol

## 2021-10-15 NOTE — ED Notes (Signed)
Patient transported to CT 

## 2021-10-15 NOTE — Discharge Instructions (Signed)
Follow-up with your family doctor next week for recheck.  We gave you 1 your heart medicines called diltiazem or also called Cardizem.  Do not take that again today but make sure you take your other medicines ?

## 2021-10-28 ENCOUNTER — Encounter: Payer: Self-pay | Admitting: Physician Assistant

## 2021-10-28 ENCOUNTER — Telehealth: Payer: Self-pay | Admitting: Physician Assistant

## 2021-10-28 ENCOUNTER — Ambulatory Visit (INDEPENDENT_AMBULATORY_CARE_PROVIDER_SITE_OTHER): Payer: Medicare Other | Admitting: Physician Assistant

## 2021-10-28 VITALS — BP 120/78 | HR 84 | Ht 62.0 in | Wt 194.6 lb

## 2021-10-28 DIAGNOSIS — N179 Acute kidney failure, unspecified: Secondary | ICD-10-CM

## 2021-10-28 DIAGNOSIS — R0602 Shortness of breath: Secondary | ICD-10-CM | POA: Diagnosis not present

## 2021-10-28 DIAGNOSIS — I700 Atherosclerosis of aorta: Secondary | ICD-10-CM

## 2021-10-28 DIAGNOSIS — R778 Other specified abnormalities of plasma proteins: Secondary | ICD-10-CM

## 2021-10-28 DIAGNOSIS — I10 Essential (primary) hypertension: Secondary | ICD-10-CM | POA: Diagnosis not present

## 2021-10-28 DIAGNOSIS — E782 Mixed hyperlipidemia: Secondary | ICD-10-CM | POA: Diagnosis not present

## 2021-10-28 DIAGNOSIS — I4819 Other persistent atrial fibrillation: Secondary | ICD-10-CM

## 2021-10-28 DIAGNOSIS — R600 Localized edema: Secondary | ICD-10-CM

## 2021-10-28 DIAGNOSIS — I6523 Occlusion and stenosis of bilateral carotid arteries: Secondary | ICD-10-CM | POA: Diagnosis not present

## 2021-10-28 NOTE — Telephone Encounter (Signed)
? ?  Post-visit I dug more into details of prior ER visits even before the recent 2 encounters. She was also seen 08/2021 for left sided weakness and stroke was ruled out. She also had left shoulder pain with movement of the joint. Troponin was mildly elevated though negative in 09/2021 and 10/2021. Therefore I would like to pursue a 2D echo to ensure no change from 2020. If stable, will keep the plan for 6 month follow-up. If changed from prior will plan to d/w primary cardiologist. ? ?Melina Copa PA-C ? ?

## 2021-10-28 NOTE — Patient Instructions (Signed)
Medication Instructions:  ?Your physician recommends that you continue on your current medications as directed. Please refer to the Current Medication list given to you today. ? ?*If you need a refill on your cardiac medications before your next appointment, please call your pharmacy* ? ? ?Lab Work: ?Your physician recommends that you return for lab work in: Today  ? ? ?If you have labs (blood work) drawn today and your tests are completely normal, you will receive your results only by: ?MyChart Message (if you have MyChart) OR ?A paper copy in the mail ?If you have any lab test that is abnormal or we need to change your treatment, we will call you to review the results. ? ? ?Testing/Procedures: ?NONE  ? ? ?Follow-Up: ?At [facility], you and your health needs are our priority.  As part of our continuing mission to provide you with exceptional heart care, we have created designated Provider Care Teams.  These Care Teams include your primary Cardiologist (physician) and Advanced Practice Providers (APPs -  Physician Assistants and Nurse Practitioners) who all work together to provide you with the care you need, when you need it. ? ?We recommend signing up for the patient portal called "MyChart".  Sign up information is provided on this After Visit Summary.  MyChart is used to connect with patients for Virtual Visits (Telemedicine).  Patients are able to view lab/test results, encounter notes, upcoming appointments, etc.  Non-urgent messages can be sent to your provider as well.   ?To learn more about what you can do with MyChart, go to [email or web address].   ? ?Your next appointment:   ?6 month(s) ? ?The format for your next appointment:   ?In Person ? ?Provider:   ?Carlyle Dolly, MD  ? ? ?Other Instructions ?Thank you for choosing [facility]! ?  ? ?Important Information About [employer] ? ? ? ? ?  ?

## 2021-10-28 NOTE — Progress Notes (Signed)
? ?Cardiology Office Note   ? ?Date:  10/28/2021  ? ?ID:  Samantha Vega, DOB June 11, 1951, MRN [id number] ? ?PCP:  Samantha Helper, MD  ?Cardiologist:  Samantha Dolly, MD  ?Electrophysiologist:  None  ? ?Chief Complaint: f/u ER visit for dyspnea ? ?History of Present Illness:  ? ?Samantha Vega is a 71 y.o. female with history of atypical chest pain and dyspnea, persistent Afib, HTN, HLD (managed by PCP), LE edema, DM, metabolic syndrome, obesity, arthritis, RBBB + LAFB, TIA, aortic atherosclerosis, nonobstructive carotid disease 2014, thyroid nodules (reassuring [place] 2021) who is seen for ER follow-up.  ? ?Per notes she has a longstanding history of atypical chest pain. Last echo 05/2019 EF 50-55%, + WMA, low normal RVSF.  Of note, CT in 03/2021 showed no PE but did raise question of chronic PE. She has been on anticoagulation for her atrial fib with warfarin as DOACS were too expensive. She has been managed with rate control strategy and was in afib at last cardiology OV 04/2021. At that time, nuclear stress test was pursued for chest pain which showed findings c/w prior MI, no large ischemic territories, EF 52%. Samantha Vega did not recommend any additional testing at that time and advised f/u with PCP. ? ?She has a history of periodic ED evaluations for various symptoms - stiffness of her extremities, weakness, left shoulder pain with movement, headache, atypical chest pain, sweating/fatigue after joint injection, SOB, and skin irritation. During the 08/2021 ED visit she had presented with left sided weakness. There was concern for stroke but MRI was negative. UDS was negative. She had a mildly elevated troponin 133->94, though nonspecific as she did not have any chest pain at the time and her arm pain occurred specifically when she moved it. During her recent ED visit 10/15/21, she was in rapid afib but had not taken her medicine yet so was treated with IV diltiazem briefly. She was felt to have a skin  infection so was placed on Keflex. CXR showed NAD. Of note, her troponins were negative in 09/2021 and 10/2021. ? ?She is seen for follow-up today overall feeling better. She denies any SOB/DOE since she was discharged from ED. She denies recent chest pain, palpitations, dizziness, syncope or any specific complaints. She reports all symptoms have resolved. Her affect is generally flat but overall she is pleased with how she is feeling. Her HR is 84-93 in clinic today. She is accompanied by her husband. She also has f/u coming up with PCP this week. She reports she's only been taking her Keflex once at night because it "helps her sleep." ? ? ?Labwork independently reviewed: ?10/2021 LFTs wnl, troponins neg x2, BNP 107, K 3.7, BUN 27/ Cr 1.09, CBC wnl ?08/2021 UDS neg ?07/2021 TSH wnl, LDL 92 (PCP) ? ?Cardiology Studies:  ? ?Studies reviewed are outlined and summarized above. Reports included below if pertinent.  ? ?Nuc 04/2021 ?  Findings are consistent with prior myocardial infarction. The study is intermediate risk. ?  No ST deviation was noted. The ECG was negative for ischemia. ?  LV perfusion is abnormal. Defect 1: There is a large defect with moderate reduction in uptake present in the apical to basal inferior and inferolateral location(s) that is fixed. There is abnormal wall motion in the defect area. Consistent with infarction. ?  Left ventricular function is normal. Nuclear stress EF: 52 %. ?  ?Intermediate risk study.  There is an apical to basal inferior/inferolateral perfusion defect that is fixed  and most consistent with infarct scar although there is global hypokinesis in general with LVEF low normal at 52%.  Associated breast attenuation artifact also noted.  No large ischemic territories. ? ?Echo 05/2019 ? ? 1. Left ventricular ejection fraction, by visual estimation, is 50 to  ?55%. The left ventricle has low normal function. There is borderline left  ?ventricular hypertrophy.  ? 2. Apical lateral  segment, apical anterior segment, apical inferior  ?segment, and apex are abnormal.  ? 3. Left ventricular diastolic parameters are indeterminate in the setting  ?of atrial fibrillation.  ? 4. The left ventricle demonstrates regional wall motion abnormalities.  ? 5. Global right ventricle has low normal systolic function.The right  ?ventricular size is normal. No increase in right ventricular wall  ?thickness.  ? 6. Left atrial size was normal.  ? 7. Right atrial size was normal.  ? 8. Mild mitral annular calcification.  ? 9. The mitral valve is grossly normal. Trivial mitral valve  ?regurgitation.  ?10. The tricuspid valve is grossly normal. Tricuspid valve regurgitation  ?is trivial.  ?11. The aortic valve is tricuspid. Aortic valve regurgitation is not  ?visualized.  ?12. The pulmonic valve was grossly normal. Pulmonic valve regurgitation is  ?trivial.  ?13. TR signal is inadequate for assessing pulmonary artery systolic  ?pressure.  ?14. The inferior vena cava is normal in size with greater than 50%  ?respiratory variability, suggesting right atrial pressure of 3 mmHg.  ?15. No obvious right to left interatrial shunting by saline contrast.  ? ?Carotid US 2014 ?1.  Bilateral carotid bifurcation and proximal ICA plaque resulting  ?in less than 50% diameter stenosis. The exam does not exclude  ?plaque ulceration or embolization.  Continued surveillance  ?recommended.  ? ?2.  3.4 cm left thyroid nodule. Consider further evaluation with  ?thyroid ultrasound.  If patient is clinically hyperthyroid,  ?consider nuclear medicine thyroid uptake and scan.   ? ? ?Past Medical History:  ?Diagnosis Date  ? Annual physical exam 06/07/2015  ? Aortic atherosclerosis ([place])   ? Arthritis   ? Baker's cyst of knee 10/04/2013  ? Diabetes mellitus   ? Hyperlipidemia   ? Hypertension   ? Metabolic syndrome X 97/98/9211  ? Mild carotid artery disease ([place])   ? OA (osteoarthritis) of knee 05/05/2012  ? Left, takes tramadol on avg twice  per week  ? Obesity   ? PAF (paroxysmal atrial fibrillation) ([place])   ? Persistent atrial fibrillation ([place])   ? Right bundle branch block   ? +Palpitations.   EKG 11/2006:  NSR, RBBB, LAFB, markedly delayed R-wave progression, no change; Echocardiogram in 10/2006-suboptimal quality, normal; Event recorder-PVCs, no significant arrhythmias or symptoms ;  ? TIA (transient ischemic attack) 06/02/2019  ? Unspecified visual loss 12/08/2008  ? ? ?Past Surgical History:  ?Procedure Laterality Date  ? ABDOMINAL HYSTERECTOMY    ? BREAST BIOPSY Left   ? benign  ? CATARACT EXTRACTION W/PHACO Right 01/08/2016  ? Procedure: CATARACT EXTRACTION PHACO AND INTRAOCULAR LENS PLACEMENT (IOC);  Surgeon: Rutherford Guys, MD;  Location: [facility];  Service: Ophthalmology;  Laterality: Right;  CDE: 5.78  ? CATARACT EXTRACTION W/PHACO Left 02/05/2016  ? Procedure: CATARACT EXTRACTION PHACO AND INTRAOCULAR LENS PLACEMENT LEFT EYE CDE=6.50;  Surgeon: Rutherford Guys, MD;  Location: [facility];  Service: Ophthalmology;  Laterality: Left;  ? COLONOSCOPY  2006  ? Dr. Tamala Julian: Normal, repeat in 2009.  ? COLONOSCOPY N/A 10/08/2015  ? Procedure: COLONOSCOPY;  Surgeon: Danie Binder, MD;  Location: [facility];  Service: Endoscopy;  Laterality: N/A;  230 - moved to 1:30, spoke with pt ?  ? COLONOSCOPY WITH PROPOFOL N/A 01/28/2021  ? Procedure: COLONOSCOPY WITH PROPOFOL;  Surgeon: Eloise Harman, DO;  Location: [facility];  Service: Endoscopy;  Laterality: N/A;  9:45am  ? POLYPECTOMY  10/08/2015  ? Procedure: POLYPECTOMY;  Surgeon: Danie Binder, MD;  Location: [facility];  Service: Endoscopy;;  cecal polyp removed via cold forceps with one clip placed/ rectal polyp removed via cold forcep  ? POLYPECTOMY  01/28/2021  ? Procedure: POLYPECTOMY;  Surgeon: Eloise Harman, DO;  Location: [facility];  Service: Endoscopy;;  ? SHOULDER SURGERY  2006  ? Left-post-trauma  ? TOTAL ABDOMINAL HYSTERECTOMY W/ BILATERAL SALPINGOOPHORECTOMY    ? ? ?Current  Medications: ?Current Meds  ?Medication Sig  ? atorvastatin (LIPITOR) 80 MG tablet TAKE 1 TABLET BY MOUTH EVERY DAY (Patient taking differently: Take 80 mg by mouth daily.)  ? benazepril (LOTENSIN) 20 MG tablet TAKE 1 TABLET

## 2021-10-28 NOTE — Telephone Encounter (Signed)
Called to notify pt of the need to have an Echo done. No answer. Unable to leave msg.  ?

## 2021-10-29 DIAGNOSIS — N179 Acute kidney failure, unspecified: Secondary | ICD-10-CM | POA: Diagnosis not present

## 2021-10-30 ENCOUNTER — Ambulatory Visit (INDEPENDENT_AMBULATORY_CARE_PROVIDER_SITE_OTHER): Payer: Medicare Other | Admitting: Family Medicine

## 2021-10-30 ENCOUNTER — Encounter: Payer: Self-pay | Admitting: Family Medicine

## 2021-10-30 VITALS — BP 118/72 | HR 96 | Resp 16 | Ht 62.0 in | Wt 194.0 lb

## 2021-10-30 DIAGNOSIS — E785 Hyperlipidemia, unspecified: Secondary | ICD-10-CM | POA: Diagnosis not present

## 2021-10-30 DIAGNOSIS — I10 Essential (primary) hypertension: Secondary | ICD-10-CM | POA: Diagnosis not present

## 2021-10-30 DIAGNOSIS — E1159 Type 2 diabetes mellitus with other circulatory complications: Secondary | ICD-10-CM | POA: Diagnosis not present

## 2021-10-30 DIAGNOSIS — Z09 Encounter for follow-up examination after completed treatment for conditions other than malignant neoplasm: Secondary | ICD-10-CM

## 2021-10-30 LAB — BASIC METABOLIC PANEL
BUN/Creatinine Ratio: 15 (ref 12–28)
BUN: 13 mg/dL (ref 8–27)
CO2: 24 mmol/L (ref 20–29)
Calcium: 8.9 mg/dL (ref 8.7–10.3)
Chloride: 103 mmol/L (ref 96–106)
Creatinine, Ser: 0.85 mg/dL (ref 0.57–1.00)
Glucose: 95 mg/dL (ref 70–99)
Potassium: 4.2 mmol/L (ref 3.5–5.2)
Sodium: 140 mmol/L (ref 134–144)
eGFR: 73 mL/min/{1.73_m2} (ref >59)

## 2021-10-30 NOTE — Patient Instructions (Addendum)
F/U end August,, keep appt aready made flu vaccine at visit and foot exam at [facility] , call if you need me before   Please get fasting lipid, cmp and EGFR , and HBa1C 5 days before August appt   Need shingrix vaccines, 2 at your pharmacy  Need to complete the keflex ( green capsule) that was prescribed in the ED  It is important that you exercise regularly at least 30 minutes 5 times a week. If you develop chest pain, have severe difficulty breathing, or feel very tired, stop exercising immediately and seek medical attention   Thanks for choosing [facility], we consider it a privelige to serve you.

## 2021-10-31 NOTE — Telephone Encounter (Signed)
I spoke with patient,she will have echo 11/07/21

## 2021-11-01 ENCOUNTER — Ambulatory Visit (INDEPENDENT_AMBULATORY_CARE_PROVIDER_SITE_OTHER): Payer: Medicare Other | Admitting: Family Medicine

## 2021-11-01 ENCOUNTER — Encounter: Payer: Self-pay | Admitting: Family Medicine

## 2021-11-01 VITALS — BP 115/80 | HR 90 | Ht 62.0 in | Wt 194.1 lb

## 2021-11-01 DIAGNOSIS — L602 Onychogryphosis: Secondary | ICD-10-CM

## 2021-11-01 DIAGNOSIS — E1159 Type 2 diabetes mellitus with other circulatory complications: Secondary | ICD-10-CM | POA: Diagnosis not present

## 2021-11-01 DIAGNOSIS — L608 Other nail disorders: Secondary | ICD-10-CM | POA: Diagnosis not present

## 2021-11-01 DIAGNOSIS — I10 Essential (primary) hypertension: Secondary | ICD-10-CM | POA: Diagnosis not present

## 2021-11-01 DIAGNOSIS — B351 Tinea unguium: Secondary | ICD-10-CM

## 2021-11-01 MED ORDER — TERBINAFINE HCL 250 MG PO TABS: 250.0000 mg | ORAL_TABLET | Freq: Every day | ORAL | 1 refills | Status: DC

## 2021-11-01 NOTE — Assessment & Plan Note (Signed)
Refer podiatry need cutting nails

## 2021-11-01 NOTE — Assessment & Plan Note (Signed)
Need podiatry to manage feet, callous, long thick toenails, discolored toenail

## 2021-11-01 NOTE — Assessment & Plan Note (Signed)
  Patient re-educated about  the importance of commitment to a  minimum of 150 minutes of exercise per week as able.  The importance of healthy food choices with portion control discussed, as well as eating regularly and within a 12 hour window most days. The need to choose "clean , green" food 50 to 75% of the time is discussed, as well as to make water the primary drink and set a goal of 64 ounces water daily.       11/01/2021    8:17 AM 10/30/2021    3:52 PM 10/28/2021    2:52 PM  Weight /BMI  Weight 194 lb 1.3 oz 194 lb 194 lb 9.6 oz  Height '5\' 2"'$  (1.575 m) '5\' 2"'$  (1.575 m) '5\' 2"'$  (1.575 m)  BMI 35.5 kg/m2 35.48 kg/m2 35.59 kg/m2

## 2021-11-01 NOTE — Progress Notes (Signed)
   Samantha Vega     MRN: [id number]      DOB: [birth year]   HPI Ms. Galvis is here with a 2 day h/o black left great toenail at the base, feels slightly sore, no recall of trauma ROS See HPI  Denies recent fever or chills. Denies sinus pressure, nasal congestion, ear pain or sore throat. Denies chest congestion, productive cough or wheezing. Denies chest pains, palpitations and leg swelling Thick and calloused skin of feet and thick toenails PE  BP 115/80   Pulse 90   Ht '5\' 2"'$  (1.575 m)   Wt 194 lb 1.3 oz (88 kg)   SpO2 97%   BMI 35.50 kg/m   Patient alert and oriented and in no cardiopulmonary distress.  HEENT: No facial asymmetry, EOMI,     Neck supple .  Chest: Clear to auscultation bilaterally.  CVS: S1, S2 no murmurs, no S3.Regular rate.  ABD: Soft non tender.   Ext: No edema  MS: Adequate ROM spine, shoulders, hips and knees.  Skin: Isevere tinea pedisPsych: Good eye contact, normal affect. Memory intact not anxious or depressed appearing.  CNS: CN 2-12 intact, power,  normal throughout.no focal deficits noted.   Assessment & Plan  Discolored nails Left great toenai;l discolored at base, appears as if it may be old bruise, refer podiatry, pt is diabetic  Long toenail Refer podiatry need cutting nails  Type 2 diabetes mellitus with vascular disease ([place]) Need podiatry to manage feet, callous, long thick toenails, discolored toenail  Onychomycosis Bilateral severe onychomycosis and tinea pedis, terbinafine x 12 weeks  Essential hypertension Controlled, no change in medication DASH diet and commitment to daily physical activity for a minimum of 30 minutes discussed and encouraged, as a part of hypertension management. The importance of attaining a healthy weight is also discussed.     11/01/2021    8:17 AM 10/30/2021    4:33 PM 10/30/2021    3:52 PM 10/28/2021    2:52 PM 10/15/2021    3:16 PM 10/15/2021    3:15 PM 10/15/2021    3:00 PM  BP/Weight   Systolic BP 673 419 379 024 097 353 97  Diastolic BP 80 72 72 78 81 81 73  Wt. (Lbs) 194.08  194 194.6     BMI 35.5 kg/m2  35.48 kg/m2 35.59 kg/m2          Morbid obesity ([facility])  Patient re-educated about  the importance of commitment to a  minimum of 150 minutes of exercise per week as able.  The importance of healthy food choices with portion control discussed, as well as eating regularly and within a 12 hour window most days. The need to choose "clean , green" food 50 to 75% of the time is discussed, as well as to make water the primary drink and set a goal of 64 ounces water daily.       11/01/2021    8:17 AM 10/30/2021    3:52 PM 10/28/2021    2:52 PM  Weight /BMI  Weight 194 lb 1.3 oz 194 lb 194 lb 9.6 oz  Height '5\' 2"'$  (1.575 m) '5\' 2"'$  (1.575 m) '5\' 2"'$  (1.575 m)  BMI 35.5 kg/m2 35.48 kg/m2 35.59 kg/m2

## 2021-11-01 NOTE — Assessment & Plan Note (Signed)
Left great toenai;l discolored at base, appears as if it may be old bruise, refer podiatry, pt is diabetic

## 2021-11-01 NOTE — Assessment & Plan Note (Signed)
Bilateral severe onychomycosis and tinea pedis, terbinafine x 12 weeks

## 2021-11-01 NOTE — Patient Instructions (Signed)
F/u as before , call if you need me sooner  You are referred to [place] for fungus is prescribed, take every day for 12 weeks  \ Thanks for choosing [facility], we consider it a privelige to serve you. Foot exam today

## 2021-11-01 NOTE — Assessment & Plan Note (Signed)
Controlled, no change in medication DASH diet and commitment to daily physical activity for a minimum of 30 minutes discussed and encouraged, as a part of hypertension management. The importance of attaining a healthy weight is also discussed.     11/01/2021    8:17 AM 10/30/2021    4:33 PM 10/30/2021    3:52 PM 10/28/2021    2:52 PM 10/15/2021    3:16 PM 10/15/2021    3:15 PM 10/15/2021    3:00 PM  BP/Weight  Systolic BP 188 416 606 301 601 093 97  Diastolic BP 80 72 72 78 81 81 73  Wt. (Lbs) 194.08  194 194.6     BMI 35.5 kg/m2  35.48 kg/m2 35.59 kg/m2

## 2021-11-03 ENCOUNTER — Encounter: Payer: Self-pay | Admitting: Family Medicine

## 2021-11-03 NOTE — Assessment & Plan Note (Signed)
Patient in for follow up of recent ED visit. Discharge summary, and laboratory and radiology data are reviewed, and any questions or concerns  are discussed. Specific issues requiring follow up are specifically addressed.  

## 2021-11-03 NOTE — Progress Notes (Signed)
   Samantha Vega     MRN: [id number]      DOB: 09/27/1950   HPI Samantha Vega is here for follow up and re-evaluation of chronic medical conditions, medication management and review of any available recent lab and radiology data.  Preventive health is updated, specifically  Cancer screening and Immunization.   Was in the ED on 05/02 with shortness of breath which has significantly improved. Ed visit reviewed with pt and questions answered to her satisfaction Diagnosed with cellulitis right anterior chest, has not completed the keflex prescribed The PT denies any adverse reactions to current medications since the last visit.  There are no new concerns.  There are no specific complaints   ROS Denies recent fever or chills. Denies sinus pressure, nasal congestion, ear pain or sore throat. Denies chest congestion, productive cough or wheezing. Denies chest pains, palpitations and leg swelling Denies abdominal pain, nausea, vomiting,diarrhea or constipation.   Denies dysuria, frequency, hesitancy or incontinence. Denies joint pain, swelling and limitation in mobility. Denies headaches, seizures, numbness, or tingling. Denies depression, anxiety or insomnia. Denies skin break down or rash.   PE  BP 118/72   Pulse 96   Resp 16   Ht '5\' 2"'$  (1.575 m)   Wt 194 lb (88 kg)   SpO2 96%   BMI 35.48 kg/m   Patient alert and oriented and in no cardiopulmonary distress.  HEENT: No facial asymmetry, EOMI,     Neck supple .  Chest: Clear to auscultation bilaterally.  CVS: S1, S2 no murmurs, no S3.IrRegular rate.  ABD: Soft non tender.   Ext: No edema  MS: Adequate ROM spine, shoulders, hips and knees.  Skin: Intact, rash on anterior chest .  Psych: Good eye contact, normal affect. Memory intact not anxious or depressed appearing.  CNS: CN 2-12 intact, power,  normal throughout.no focal deficits noted.   Assessment & Plan  Encounter for examination following treatment at  hospital Patient in for follow up of recent ED visit Discharge summary, and laboratory and radiology data are reviewed, and any questions or concerns  are discussed. Specific issues requiring follow up are specifically addressed.

## 2021-11-07 ENCOUNTER — Ambulatory Visit (INDEPENDENT_AMBULATORY_CARE_PROVIDER_SITE_OTHER): Payer: Medicare Other | Admitting: *Deleted

## 2021-11-07 ENCOUNTER — Ambulatory Visit (HOSPITAL_COMMUNITY)
Admission: RE | Admit: 2021-11-07 | Discharge: 2021-11-07 | Disposition: A | Discharge status: Home / Self Care | Payer: Medicare Other | Source: Ambulatory Visit | Attending: Physician Assistant | Admitting: Physician Assistant

## 2021-11-07 DIAGNOSIS — R778 Other specified abnormalities of plasma proteins: Secondary | ICD-10-CM | POA: Insufficient documentation

## 2021-11-07 DIAGNOSIS — I480 Paroxysmal atrial fibrillation: Secondary | ICD-10-CM | POA: Diagnosis not present

## 2021-11-07 DIAGNOSIS — Z5181 Encounter for therapeutic drug level monitoring: Secondary | ICD-10-CM

## 2021-11-07 DIAGNOSIS — I4510 Unspecified right bundle-branch block: Secondary | ICD-10-CM | POA: Insufficient documentation

## 2021-11-07 DIAGNOSIS — I4891 Unspecified atrial fibrillation: Secondary | ICD-10-CM | POA: Diagnosis not present

## 2021-11-07 DIAGNOSIS — R0602 Shortness of breath: Secondary | ICD-10-CM | POA: Diagnosis not present

## 2021-11-07 DIAGNOSIS — I700 Atherosclerosis of aorta: Secondary | ICD-10-CM | POA: Insufficient documentation

## 2021-11-07 DIAGNOSIS — R079 Chest pain, unspecified: Secondary | ICD-10-CM | POA: Insufficient documentation

## 2021-11-07 LAB — ECHOCARDIOGRAM COMPLETE
AR max vel: 2.61 cm2
AV Area VTI: 2.09 cm2
AV Area mean vel: 2.14 cm2
AV Mean grad: 2 mmHg
AV Peak grad: 3.8 mmHg
Ao pk vel: 0.98 m/s
Area-P 1/2: 3.54 cm2
MV VTI: 2.08 cm2
S' Lateral: 3.4 cm

## 2021-11-07 LAB — POCT INR: INR: 2.7 (ref 2.0–3.0)

## 2021-11-07 NOTE — Progress Notes (Signed)
*  PRELIMINARY RESULTS* Echocardiogram 2D Echocardiogram has been performed.  Samantha Vega 11/07/2021, 12:54 PM

## 2021-11-07 NOTE — Patient Instructions (Signed)
Description   Continue warfarin 1 tablet daily except 1/2 tablet on Tuesdays, Thursdays and Saturdays Recheck in 6 wks

## 2021-11-18 ENCOUNTER — Ambulatory Visit: Payer: Medicare Other | Admitting: Podiatry

## 2021-11-22 ENCOUNTER — Ambulatory Visit (INDEPENDENT_AMBULATORY_CARE_PROVIDER_SITE_OTHER): Payer: Medicare Other | Admitting: Cardiology

## 2021-11-22 ENCOUNTER — Encounter: Payer: Self-pay | Admitting: Cardiology

## 2021-11-22 VITALS — BP 110/78 | HR 74 | Ht 62.0 in | Wt 191.8 lb

## 2021-11-22 DIAGNOSIS — I5022 Chronic systolic (congestive) heart failure: Secondary | ICD-10-CM

## 2021-11-22 MED ORDER — METOPROLOL SUCCINATE ER 200 MG PO TB24: 200.0000 mg | ORAL_TABLET | Freq: Every day | ORAL | 3 refills | Status: DC

## 2021-11-22 MED ORDER — ENTRESTO 24-26 MG PO TABS
1.0000 | ORAL_TABLET | Freq: Two times a day (BID) | ORAL | 6 refills | Status: DC
Start: 2021-11-22 — End: 2022-03-05

## 2021-11-22 NOTE — Progress Notes (Signed)
Clinical Summary Samantha Vega is a 71 y.o.female seen today for follow up of the following medical problems.Focused visit on history of chest pain and recent echo showing mild systolic dysfunction.    1. Chest pain - history of atypical chest pain - she denies any recent symptoms.    - no chest pains   - ER visit 03/19/2021 with chest pain - trop neg x 2. EKG without acute ischemic changes - CT PE no acute PE, questionable chronic PE   - sharp pain entire chest, mild pain. Can be positional. Lasts just a few minutes. No other associated symptoms. Can occur at rest or with activity. Occurs few times a week - sedentary lifestyle, some DOE with house cleaning.    CAD risk factors: HL, HTN, DM2    05/2019 echo LVEF 50-55%, apical hypokinesis 04/2021 nuclear stress: apical to basal inferior/inferolateral fixed defect suggesting prior infarct. LVEF 52% 10/2021 echo: LVEF 45-50%, apex hypokinetic, indet diastolic fxn, mild RV dysfunction - no recent edema. No DOE - compliant wit meds    Other medical issues not addressed this visit.   2. Paroxysmal afib - rate control strategy w/ diltiazem and metoprolol   - previously on xarelto however cost was too much, changed to coumadin.       - infrequent palpitations  - no bleeding on coumadin.    3. HTN   - she is compliant with meds   4. HL   - she is on atorvastatin   07/2020 TC 153 TG 76 HDL 38 LDL 100 02/2021 TC 145 TG 89 HDL 37 LDL 91   5. 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   EKG 11/2006:  NSR, RBBB, LAFB, markedly delayed R-wave progression, no change; Echocardiogram in 10/2006-suboptimal quality, normal; Event recorder-PVCs, no significant arrhythmias or symptoms ;   TIA (transient ischemic attack) 06/02/2019   Unspecified visual loss 12/08/2008     No Known Allergies   Current Outpatient Medications  Medication Sig Dispense Refill   atorvastatin (LIPITOR) 80 MG tablet TAKE 1 TABLET BY MOUTH EVERY DAY (Patient taking differently: Take 80 mg by mouth daily.) 90 tablet 3   benazepril (LOTENSIN) 20 MG tablet TAKE 1 TABLET BY MOUTH EVERY DAY (Patient taking differently: Take 20 mg by mouth daily.) 90 tablet 1   cephALEXin (KEFLEX) 500 MG capsule Take 1 capsule (500 mg total) by mouth 4 (four) times daily. (Patient not taking: Reported on 10/30/2021) 28 capsule 0   cholecalciferol (VITAMIN D3) 25 MCG (1000 UNIT) tablet Take 1,000 Units by mouth daily.     diltiazem (CARDIZEM CD) 120 MG 24 hr capsule TAKE 1 CAPSULE BY MOUTH EVERY DAY 90 capsule 2   furosemide (LASIX) 20 MG tablet TAKE 1 TABLET (20 MG TOTAL) BY MOUTH DAILY AS NEEDED FOR FLUID. 90 tablet 1   metFORMIN (GLUCOPHAGE-XR) 500 MG 24 hr tablet TAKE 1 TABLET  BY MOUTH EVERY DAY WITH BREAKFAST (Patient taking differently: Take 500 mg by mouth daily with breakfast.) 90 tablet 1   metoprolol tartrate (LOPRESSOR) 100 MG tablet TAKE 1 TABLET BY MOUTH TWICE A DAY 180 tablet 0   montelukast (SINGULAIR) 10 MG tablet TAKE 1 TABLET BY MOUTH EVERYDAY AT BEDTIME 90 tablet 1   polyethylene glycol-electrolytes (TRILYTE) 420 g solution Take 4,000 mLs by mouth as directed. (Patient not taking: Reported on 10/30/2021) 4000 mL 0   terbinafine (LAMISIL) 250 MG tablet Take 1 tablet (250 mg total) by mouth daily. 42 tablet 1   warfarin (COUMADIN) 5 MG tablet TAKE 1 TABLET DAILY EXCEPT 1/2 TABLET ON SUNDAY AND THURSDAY OR AS DIRECTED BY COUMADIN CLINIC 100 tablet 3    No current facility-administered medications for this visit.     Past Surgical History:  Procedure Laterality Date   ABDOMINAL HYSTERECTOMY     BREAST BIOPSY Left    benign   CATARACT EXTRACTION W/PHACO Right 01/08/2016   Procedure: CATARACT EXTRACTION PHACO AND INTRAOCULAR LENS PLACEMENT (IOC);  Surgeon: Rutherford Guys, MD;  Location: [facility];  Service: Ophthalmology;  Laterality: Right;  CDE: 5.78   CATARACT EXTRACTION W/PHACO Left 02/05/2016   Procedure: CATARACT EXTRACTION PHACO AND INTRAOCULAR LENS PLACEMENT LEFT EYE CDE=6.50;  Surgeon: Rutherford Guys, MD;  Location: [facility];  Service: Ophthalmology;  Laterality: Left;   COLONOSCOPY  2006   Dr. Tamala Julian: Normal, repeat in 2009.   COLONOSCOPY N/A 10/08/2015   Procedure: COLONOSCOPY;  Surgeon: Danie Binder, MD;  Location: [facility];  Service: Endoscopy;  Laterality: N/A;  230 - moved to 1:30, spoke with pt    COLONOSCOPY WITH PROPOFOL N/A 01/28/2021   Procedure: COLONOSCOPY WITH PROPOFOL;  Surgeon: Eloise Harman, DO;  Location: [facility];  Service: Endoscopy;  Laterality: N/A;  9:45am   POLYPECTOMY  10/08/2015   Procedure: POLYPECTOMY;  Surgeon: Danie Binder, MD;  Location: [facility];  Service: Endoscopy;;  cecal polyp removed via cold forceps with one clip placed/ rectal polyp removed via cold forcep   POLYPECTOMY  01/28/2021   Procedure: POLYPECTOMY;  Surgeon: Eloise Harman, DO;  Location: [facility];  Service: Endoscopy;;   SHOULDER SURGERY  2006   Left-post-trauma   TOTAL ABDOMINAL HYSTERECTOMY W/ BILATERAL SALPINGOOPHORECTOMY       No Known Allergies    Family History  Problem Relation Age of Onset   Hypertension Mother    Arthritis Mother    Stroke Father    Hypertension Father    Coronary artery disease Father    Diabetes Sister    Sarcoidosis Sister    Diabetes Sister    Heart failure Brother    Congestive Heart Failure Brother    Colon cancer Neg Hx      Social History Ms. Schnider reports  that she has never smoked. She has never used smokeless tobacco. Ms. Mogensen reports no history of alcohol use.   Review of Systems CONSTITUTIONAL: No weight loss, fever, chills, weakness or fatigue.  HEENT: Eyes: No visual loss, blurred vision, double vision or yellow sclerae.No hearing loss, sneezing, congestion, runny nose or sore throat.  SKIN: No rash or itching.  CARDIOVASCULAR: per hpi RESPIRATORY: No shortness of breath, cough or sputum.  GASTROINTESTINAL: No anorexia, nausea, vomiting or diarrhea. No abdominal pain or blood.  GENITOURINARY: No burning on urination, no polyuria NEUROLOGICAL: No headache, dizziness, syncope, paralysis, ataxia, numbness or tingling in the extremities. No change in bowel or bladder  control.  MUSCULOSKELETAL: No muscle, back pain, joint pain or stiffness.  LYMPHATICS: No enlarged nodes. No history of splenectomy.  PSYCHIATRIC: No history of depression or anxiety.  ENDOCRINOLOGIC: No reports of sweating, cold or heat intolerance. No polyuria or polydipsia.  Marland Kitchen   Physical Examination Today's Vitals   11/22/21 1004  BP: 110/78  Pulse: 74  SpO2: 99%  Weight: 191 lb 12.8 oz (87 kg)  Height: '5\' 2"'$  (1.575 m)   Body mass index is 35.08 kg/m.  Gen: resting comfortably, no acute distress HEENT: no scleral icterus, pupils equal round and reactive, no palptable cervical adenopathy,  CV: RRR, no m/r/g no jvd Resp: Clear to auscultation bilaterally GI: abdomen is soft, non-tender, non-distended, normal bowel sounds, no hepatosplenomegaly MSK: extremities are warm, no edema.  Skin: warm, no rash Neuro:  no focal deficits Psych: appropriate affect   Diagnostic Studies  07/12/12 Echo: LVEF 55-60%, mild basal septal hypertrophy,     07/11/12 Carotid US: <50% bilateral stenosis, 3.4 cm thyroid nodule     05/2019 echo IMPRESSIONS     1. Left ventricular ejection fraction, by visual estimation, is 50 to  55%. The left ventricle has low normal  function. There is borderline left  ventricular hypertrophy.   2. Apical lateral segment, apical anterior segment, apical inferior  segment, and apex are abnormal.   3. Left ventricular diastolic parameters are indeterminate in the setting  of atrial fibrillation.   4. The left ventricle demonstrates regional wall motion abnormalities.   5. Global right ventricle has low normal systolic function.The right  ventricular size is normal. No increase in right ventricular wall  thickness.   6. Left atrial size was normal.   7. Right atrial size was normal.   8. Mild mitral annular calcification.   9. The mitral valve is grossly normal. Trivial mitral valve  regurgitation.  10. The tricuspid valve is grossly normal. Tricuspid valve regurgitation  is trivial.  11. The aortic valve is tricuspid. Aortic valve regurgitation is not  visualized.  12. The pulmonic valve was grossly normal. Pulmonic valve regurgitation is  trivial.  13. TR signal is inadequate for assessing pulmonary artery systolic  pressure.  14. The inferior vena cava is normal in size with greater than 50%  respiratory variability, suggesting right atrial pressure of 3 mmHg.  15. No obvious right to left interatrial shunting by saline contrast.    04/2021 nuclear stress   Findings are consistent with prior myocardial infarction. The study is intermediate risk.   No ST deviation was noted. The ECG was negative for ischemia.   LV perfusion is abnormal. Defect 1: There is a large defect with moderate reduction in uptake present in the apical to basal inferior and inferolateral location(s) that is fixed. There is abnormal wall motion in the defect area. Consistent with infarction.   Left ventricular function is normal. Nuclear stress EF: 52 %.   Intermediate risk study.  There is an apical to basal inferior/inferolateral perfusion defect that is fixed and most consistent with infarct scar although there is global hypokinesis in  general with LVEF low normal at 52%.  Associated breast attenuation artifact also noted.  No large ischemic territories.  10/2021 echo 1. Left ventricular ejection fraction, by estimation, is 45 to 50%. The  left ventricle has mildly decreased function. The left ventricle  demonstrates regional wall motion abnormalities (see scoring  diagram/findings for description). The apical anterior,   apical inferior, apical lateral and apex are hypokinetic. Diastolic  function indeteminant due to Afib.   2. Right ventricular systolic function is mildly reduced. The right  ventricular size is normal. There is normal pulmonary artery systolic  pressure.   3. Left atrial size was moderately dilated.   4. Right atrial size was mildly dilated.   5. The mitral valve is grossly normal. Trivial mitral valve  regurgitation.   6. The aortic valve is tricuspid. There is mild calcification of the  aortic valve. There is mild thickening of the aortic valve. Aortic valve  regurgitation is trivial. Aortic valve sclerosis/calcification is present,  without any evidence of aortic  stenosis.   7. The inferior vena cava is normal in size with greater than 50%  respiratory variability, suggesting right atrial pressure of 3 mmHg.   Assessment and Plan   1. Chronic systolic HF - mild LV dysfunction by recent echo, agree with echo LVEF 45% - change lopressor to toprol '200mg'$  daily. D/c benazepril, after 48 hours start entrest 24/'26mg'$  bid. Check bmet 2 weeks - with just mild systolic dysfunction and difficult to control afib reasonable to continue diltiazem at this time.               Arnoldo Lenis, M.D.

## 2021-11-22 NOTE — Patient Instructions (Signed)
Medication Instructions:  STOP Lopressor   START Toprol XL 200 mg daily   STOP Benazapril   On Sunday June 11 th, start Entresto 24/26 mg twice a day  Labwork:  BMET in 2 weeks at [facility]  Testing/Procedures: None   Follow-Up: 3 months  Any Other Special Instructions Will Be Listed Below (If Applicable).  If you need a refill on your cardiac medications before your next appointment, please call your pharmacy.

## 2021-11-25 ENCOUNTER — Ambulatory Visit (INDEPENDENT_AMBULATORY_CARE_PROVIDER_SITE_OTHER): Payer: Medicare Other | Admitting: Podiatry

## 2021-11-25 ENCOUNTER — Encounter: Payer: Self-pay | Admitting: Podiatry

## 2021-11-25 ENCOUNTER — Other Ambulatory Visit: Payer: Self-pay | Admitting: Family Medicine

## 2021-11-25 ENCOUNTER — Telehealth: Payer: Self-pay | Admitting: Cardiology

## 2021-11-25 DIAGNOSIS — L84 Corns and callosities: Secondary | ICD-10-CM | POA: Diagnosis not present

## 2021-11-25 DIAGNOSIS — S90212A Contusion of left great toe with damage to nail, initial encounter: Secondary | ICD-10-CM

## 2021-11-25 DIAGNOSIS — B351 Tinea unguium: Secondary | ICD-10-CM | POA: Diagnosis not present

## 2021-11-25 DIAGNOSIS — M79674 Pain in right toe(s): Secondary | ICD-10-CM | POA: Diagnosis not present

## 2021-11-25 DIAGNOSIS — M2142 Flat foot [pes planus] (acquired), left foot: Secondary | ICD-10-CM

## 2021-11-25 DIAGNOSIS — M79675 Pain in left toe(s): Secondary | ICD-10-CM | POA: Diagnosis not present

## 2021-11-25 DIAGNOSIS — M2141 Flat foot [pes planus] (acquired), right foot: Secondary | ICD-10-CM | POA: Diagnosis not present

## 2021-11-25 DIAGNOSIS — E119 Type 2 diabetes mellitus without complications: Secondary | ICD-10-CM

## 2021-11-25 NOTE — Telephone Encounter (Signed)
Patient walked into the office stating that she cannot afford Entresto 24/26 mg twice a day

## 2021-11-25 NOTE — Patient Instructions (Signed)
Diabetes Mellitus and Foot Care Foot care is an important part of your health, especially when you have diabetes. Diabetes may cause you to have problems because of poor blood flow (circulation) to your feet and legs, which can cause your skin to: Become thinner and drier. Break more easily. Heal more slowly. Peel and crack. You may also have nerve damage (neuropathy) in your legs and feet, causing decreased feeling in them. This means that you may not notice minor injuries to your feet that could lead to more serious problems. Noticing and addressing any potential problems early is the best way to prevent future foot problems. How to care for your feet Foot hygiene  Wash your feet daily with warm water and mild soap. Do not use hot water. Then, pat your feet and the areas between your toes until they are completely dry. Do not soak your feet as this can dry your skin. Trim your toenails straight across. Do not dig under them or around the cuticle. File the edges of your nails with an emery board or nail file. Apply a moisturizing lotion or petroleum jelly to the skin on your feet and to dry, brittle toenails. Use lotion that does not contain alcohol and is unscented. Do not apply lotion between your toes. Shoes and socks Wear clean socks or stockings every day. Make sure they are not too tight. Do not wear knee-high stockings since they may decrease blood flow to your legs. Wear shoes that fit properly and have enough cushioning. Always look in your shoes before you put them on to be sure there are no objects inside. To break in new shoes, wear them for just a few hours a day. This prevents injuries on your feet. Wounds, scrapes, corns, and calluses  Check your feet daily for blisters, cuts, bruises, sores, and redness. If you cannot see the bottom of your feet, use a mirror or ask someone for help. Do not cut corns or calluses or try to remove them with medicine. If you find a minor scrape,  cut, or break in the skin on your feet, keep it and the skin around it clean and dry. You may clean these areas with mild soap and water. Do not clean the area with peroxide, alcohol, or iodine. If you have a wound, scrape, corn, or callus on your foot, look at it several times a day to make sure it is healing and not infected. Check for: Redness, swelling, or pain. Fluid or blood. Warmth. Pus or a bad smell. General tips Do not cross your legs. This may decrease blood flow to your feet. Do not use heating pads or hot water bottles on your feet. They may burn your skin. If you have lost feeling in your feet or legs, you may not know this is happening until it is too late. Protect your feet from hot and cold by wearing shoes, such as at the beach or on hot pavement. Schedule a complete foot exam at least once a year (annually) or more often if you have foot problems. Report any cuts, sores, or bruises to your health care provider immediately. Where to find more information American Diabetes Association: www.diabetes.org Association of Diabetes Care & Education Specialists: www.diabeteseducator.org Contact a health care provider if: You have a medical condition that increases your risk of infection and you have any cuts, sores, or bruises on your feet. You have an injury that is not healing. You have redness on your legs or feet. You   feel burning or tingling in your legs or feet. You have pain or cramps in your legs and feet. Your legs or feet are numb. Your feet always feel cold. You have pain around any toenails. Get help right away if: You have a wound, scrape, corn, or callus on your foot and: You have pain, swelling, or redness that gets worse. You have fluid or blood coming from the wound, scrape, corn, or callus. Your wound, scrape, corn, or callus feels warm to the touch. You have pus or a bad smell coming from the wound, scrape, corn, or callus. You have a fever. You have a red  line going up your leg. Summary Check your feet every day for blisters, cuts, bruises, sores, and redness. Apply a moisturizing lotion or petroleum jelly to the skin on your feet and to dry, brittle toenails. Wear shoes that fit properly and have enough cushioning. If you have foot problems, report any cuts, sores, or bruises to your health care provider immediately. Schedule a complete foot exam at least once a year (annually) or more often if you have foot problems. This information is not intended to replace advice given to you by your health care provider. Make sure you discuss any questions you have with your health care provider. Document Revised: 12/22/2019 Document Reviewed: 12/22/2019 Elsevier Patient Education  2023 [employer]  

## 2021-11-26 NOTE — Telephone Encounter (Signed)
Patient assistance application mailed to patient - copay is $100+ and she can not afford that.

## 2021-12-01 NOTE — Progress Notes (Signed)
ANNUAL DIABETIC FOOT EXAM  Subjective: Samantha Vega presents today for annual diabetic foot examination.  Patient relates 15 year h/o diabetes.  Patient denies any h/o foot wounds.  Patient denies any numbness, tingling, burning, or pins/needle sensation in feet.  Last known  HgA1c was unknown. Patient did not check blood glucose this morning.  Risk factors: diabetes, PAD, h/o TIA, HTN, hyperlipidemia.  Her husband is present during today's visit. States they were concerned about discoloration of the left great toenail. They do not relate any episode of trauma. It is not painful per patient.   Fayrene Helper, MD is patient's PCP. Last visit was Nov 01, 2021.  Past Medical History:  Diagnosis Date   Annual physical exam 06/07/2015   Aortic atherosclerosis ([place])    Arthritis    Baker's cyst of knee 10/04/2013   Diabetes mellitus    Hyperlipidemia    Hypertension    Metabolic syndrome X 96/22/2979   Mild carotid artery disease (HCC)    OA (osteoarthritis) of knee 05/05/2012   Left, takes tramadol on avg twice per week   Obesity    PAF (paroxysmal atrial fibrillation) (HCC)    Persistent atrial fibrillation (HCC)    Right bundle branch block    +Palpitations.   EKG 11/2006:  NSR, RBBB, LAFB, markedly delayed R-wave progression, no change; Echocardiogram in 10/2006-suboptimal quality, normal; Event recorder-PVCs, no significant arrhythmias or symptoms ;   TIA (transient ischemic attack) 06/02/2019   Unspecified visual loss 12/08/2008   Patient Active Problem List   Diagnosis Date Noted   Long toenail 11/01/2021   Discolored nails 11/01/2021   Onychomycosis 11/01/2021   Allergic conjunctivitis 05/03/2021   Generalized pain 03/19/2021   Immobility 03/19/2021   Knee pain, right 11/15/2020   Left arm swelling 11/05/2020   Encounter for examination following treatment at hospital 10/28/2019   Type 2 diabetes mellitus with vascular disease ([place]) 08/10/2019   Thyroid  goiter 06/16/2019   Tubular adenoma of colon 10/09/2015   Chronic anticoagulation 09/16/2012   Paroxysmal atrial fibrillation ([place]) 05/22/2012   RBBB (right bundle branch block)    Morbid obesity ([place])    Chest pain    Allergic rhinitis 07/18/2010   Essential hypertension 12/08/2008   Hyperlipidemia LDL goal <70 12/06/2007   Past Surgical History:  Procedure Laterality Date   ABDOMINAL HYSTERECTOMY     BREAST BIOPSY Left    benign   CATARACT EXTRACTION W/PHACO Right 01/08/2016   Procedure: CATARACT EXTRACTION PHACO AND INTRAOCULAR LENS PLACEMENT ([place]);  Surgeon: Rutherford Guys, MD;  Location: [facility];  Service: Ophthalmology;  Laterality: Right;  CDE: 5.78   CATARACT EXTRACTION W/PHACO Left 02/05/2016   Procedure: CATARACT EXTRACTION PHACO AND INTRAOCULAR LENS PLACEMENT LEFT EYE CDE=6.50;  Surgeon: Rutherford Guys, MD;  Location: [facility];  Service: Ophthalmology;  Laterality: Left;   COLONOSCOPY  2006   Dr. Tamala Julian: Normal, repeat in 2009.   COLONOSCOPY N/A 10/08/2015   Procedure: COLONOSCOPY;  Surgeon: Danie Binder, MD;  Location: [facility];  Service: Endoscopy;  Laterality: N/A;  230 - moved to 1:30, spoke with pt    COLONOSCOPY WITH PROPOFOL N/A 01/28/2021   Procedure: COLONOSCOPY WITH PROPOFOL;  Surgeon: Eloise Harman, DO;  Location: [facility];  Service: Endoscopy;  Laterality: N/A;  9:45am   POLYPECTOMY  10/08/2015   Procedure: POLYPECTOMY;  Surgeon: Danie Binder, MD;  Location: [facility];  Service: Endoscopy;;  cecal polyp removed via cold forceps with one clip placed/  rectal polyp removed via cold forcep   POLYPECTOMY  01/28/2021   Procedure: POLYPECTOMY;  Surgeon: Eloise Harman, DO;  Location: [facility];  Service: Endoscopy;;   SHOULDER SURGERY  2006   Left-post-trauma   TOTAL ABDOMINAL HYSTERECTOMY W/ BILATERAL SALPINGOOPHORECTOMY     Current Outpatient Medications on File Prior to Visit  Medication Sig Dispense Refill   atorvastatin (LIPITOR) 80 MG tablet  TAKE 1 TABLET BY MOUTH EVERY DAY (Patient taking differently: Take 80 mg by mouth daily.) 90 tablet 3   cholecalciferol (VITAMIN D3) 25 MCG (1000 UNIT) tablet Take 1,000 Units by mouth daily.     diltiazem (CARDIZEM CD) 120 MG 24 hr capsule TAKE 1 CAPSULE BY MOUTH EVERY DAY 90 capsule 2   furosemide (LASIX) 20 MG tablet TAKE 1 TABLET (20 MG TOTAL) BY MOUTH DAILY AS NEEDED FOR FLUID. 90 tablet 1   metFORMIN (GLUCOPHAGE-XR) 500 MG 24 hr tablet TAKE 1 TABLET BY MOUTH EVERY DAY WITH BREAKFAST (Patient taking differently: Take 500 mg by mouth daily with breakfast.) 90 tablet 1   metoprolol (TOPROL XL) 200 MG 24 hr tablet Take 1 tablet (200 mg total) by mouth daily. 90 tablet 3   montelukast (SINGULAIR) 10 MG tablet TAKE 1 TABLET BY MOUTH EVERYDAY AT BEDTIME 90 tablet 1   polyethylene glycol-electrolytes (TRILYTE) 420 g solution Take 4,000 mLs by mouth as directed. (Patient not taking: Reported on 10/30/2021) 4000 mL 0   sacubitril-valsartan (ENTRESTO) 24-26 MG Take 1 tablet by mouth 2 (two) times daily. 60 tablet 6   terbinafine (LAMISIL) 250 MG tablet Take 1 tablet (250 mg total) by mouth daily. 42 tablet 1   warfarin (COUMADIN) 5 MG tablet TAKE 1 TABLET DAILY EXCEPT 1/2 TABLET ON SUNDAY AND THURSDAY OR AS DIRECTED BY COUMADIN CLINIC 100 tablet 3   No current facility-administered medications on file prior to visit.    No Known Allergies Social History   Occupational History   Occupation: Museum/gallery exhibitions officer: UNEMPLOYED  Tobacco Use   Smoking status: Never   Smokeless tobacco: Never  Vaping Use   Vaping Use: Never used  Substance and Sexual Activity   Alcohol use: No   Drug use: No   Sexual activity: Yes    Birth control/protection: Surgical   Family History  Problem Relation Age of Onset   Hypertension Mother    Arthritis Mother    Stroke Father    Hypertension Father    Coronary artery disease Father    Diabetes Sister    Sarcoidosis Sister    Diabetes Sister    Heart  failure Brother    Congestive Heart Failure Brother    Colon cancer Neg Hx    Immunization History  Administered Date(s) Administered   Fluad Quad(high Dose 65+) 04/04/2019, 02/21/2020, 03/08/2021   Influenza Split 04/22/2011, 04/16/2012   Influenza Whole 03/23/2007, 05/18/2009   Influenza,inj,Quad PF,6+ Mos 04/25/2013, 05/30/2014, 06/07/2015, 02/12/2016, 04/13/2017, 02/25/2018   Moderna Sars-Covid-2 Vaccination 09/02/2019, 10/05/2019, 06/01/2020, 01/04/2021   Pneumococcal Conjugate-13 09/06/2014   Pneumococcal Polysaccharide-23 10/31/2009, 02/12/2016   Td 06/17/2003   Tdap 04/22/2011   Zoster, Live 05/18/2009     Review of Systems: Negative except as noted in the HPI.   Objective: There were no vitals filed for this visit.  KHUSHBU PIPPEN is a pleasant 71 y.o. female in NAD. AAO X 3.  Vascular Examination: CFT <3 seconds b/l LE. Palpable DP pulse(s) b/l LE. Faintly palpable PT pulse(s) b/l LE. Trace edema noted  BLE. No ischemia or gangrene noted b/l LE. No cyanosis or clubbing noted b/l LE.  Dermatological Examination: Pedal integument with normal turgor, texture and tone b/l LE. No open wounds b/l. No interdigital macerations b/l. Toenails 1-5 b/l elongated, thickened, discolored with subungual debris. +Tenderness with dorsal palpation of nailplates. 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 No erythema, no edema, no drainage, no fluctuance. There is evidence of subacute subungual hematoma of the left great toe. Nailplate remains adhered. There is no  tenderness to palpation.  Neurological Examination: Protective sensation intact 5/5 intact bilaterally with 10g monofilament b/l. Vibratory sensation intact b/l.  Musculoskeletal Examination: Normal muscle strength 5/5 to all lower extremity muscle groups bilaterally. 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 No pain, crepitus or joint limitation noted with ROM b/l LE.  Patient ambulates independently without  assistive aids.  Footwear Assessment: Does the patient wear appropriate shoes? Yes. Does the patient need inserts/orthotics? Yes.     Latest Ref Rng & Units 08/13/2021   10:27 AM  Hemoglobin A1C  Hemoglobin-A1c 4.8 - 5.6 % 6.3    Assessment: 1. Pain due to onychomycosis of toenails of both feet   2. Callus   3. Subungual hematoma of great toe of left foot, initial encounter   4. Pes planus of both feet   5. Diabetes mellitus type 2, diet-controlled ([place])   6. Encounter for diabetic foot exam ([place])     ADA Risk Categorization: High Risk  Patient has one or more of the following: Loss of protective sensation Absent pedal pulses Severe Foot deformity History of foot ulcer  Plan: -Patient was evaluated and treated. All patient's and/or POA's questions/concerns answered on today's visit. -Diabetic foot examination performed today. -Continue foot and shoe inspections daily. Monitor blood glucose per PCP/Endocrinologist's recommendations. -Discussed diabetic foot care principles. Literature dispensed on today. -Toenails 1-5 b/l were debrided in length and girth with sterile nail nippers and dremel without iatrogenic bleeding.  -Callus(es) bilateral great toes pared utilizing sterile scalpel blade without complication or incident. Total number debrided =2. -Discussed subungual hematoma of left hallux. It is subacute in nature and nail plate remains adhered. Explained it will take time for the nail plate to grow out. No treatment necessary at this time. -Patient/POA to call should there be question/concern in the interim. Return in about 3 months (around 02/25/2022).  Marzetta Board, DPM

## 2021-12-06 ENCOUNTER — Ambulatory Visit (HOSPITAL_COMMUNITY)
Admission: RE | Admit: 2021-12-06 | Discharge: 2021-12-06 | Disposition: A | Discharge status: Home / Self Care | Payer: Medicare Other | Source: Ambulatory Visit | Attending: Family Medicine | Admitting: Family Medicine

## 2021-12-06 DIAGNOSIS — Z1231 Encounter for screening mammogram for malignant neoplasm of breast: Secondary | ICD-10-CM | POA: Insufficient documentation

## 2021-12-19 ENCOUNTER — Ambulatory Visit (INDEPENDENT_AMBULATORY_CARE_PROVIDER_SITE_OTHER): Payer: Medicare Other | Admitting: *Deleted

## 2021-12-19 DIAGNOSIS — Z5181 Encounter for therapeutic drug level monitoring: Secondary | ICD-10-CM

## 2021-12-19 DIAGNOSIS — I480 Paroxysmal atrial fibrillation: Secondary | ICD-10-CM | POA: Diagnosis not present

## 2021-12-19 LAB — POCT INR: INR: 2.1 (ref 2.0–3.0)

## 2021-12-19 NOTE — Patient Instructions (Signed)
Continue warfarin 1 tablet daily except 1/2 tablet on Tuesdays, Thursdays and Saturdays Recheck in 6 wks 

## 2022-01-22 ENCOUNTER — Ambulatory Visit: Payer: Medicare Other

## 2022-01-27 ENCOUNTER — Ambulatory Visit (INDEPENDENT_AMBULATORY_CARE_PROVIDER_SITE_OTHER): Payer: Medicare Other

## 2022-01-27 ENCOUNTER — Other Ambulatory Visit: Payer: Self-pay | Admitting: Family Medicine

## 2022-01-27 DIAGNOSIS — Z0000 Encounter for general adult medical examination without abnormal findings: Secondary | ICD-10-CM | POA: Diagnosis not present

## 2022-01-27 NOTE — Patient Instructions (Signed)
  Samantha Vega , Thank you for taking time to come for your Medicare Wellness Visit. I appreciate your ongoing commitment to your health goals. Please review the following plan we discussed and let me know if I can assist you in the future.   These are the goals we discussed:  Goals      Exercise 3x per week (30 min per time)     Recommend starting a routine exercise program at least 3 days a week for 30-45 minutes at a time as tolerated.       Increase physical activity     Start going to the [facility]         This is a list of the screening recommended for you and due dates:  Health Maintenance  Topic Date Due   COVID-19 Vaccine (5 - Moderna risk series) 03/01/2021   Tetanus Vaccine  04/21/2021   Zoster (Shingles) Vaccine (2 of 2) 01/06/2022   Flu Shot  01/14/2022   Hemoglobin A1C  02/10/2022   Eye exam for diabetics  09/11/2022   Complete foot exam   11/26/2022   Mammogram  12/07/2023   Colon Cancer Screening  01/28/2026   Pneumonia Vaccine  Completed   DEXA scan (bone density measurement)  Completed   Hepatitis C Screening: USPSTF Recommendation to screen - Ages 73-79 yo.  Completed   HPV Vaccine  Aged Out

## 2022-01-27 NOTE — Progress Notes (Signed)
Subjective:   Samantha Vega is a 71 y.o. female who presents for Medicare Annual (Subsequent) preventive examination.  Review of Systems     Cardiac Risk Factors include: diabetes mellitus;hypertension;dyslipidemia     Objective:    There were no vitals filed for this visit. There is no height or weight on file to calculate BMI.     01/27/2022    7:57 AM 10/15/2021   11:05 AM 09/27/2021   10:26 AM 09/06/2021   11:36 AM 03/19/2021    2:36 PM 01/23/2021   10:29 AM 01/21/2021    8:15 AM  Advanced Directives  Does Patient Have a Medical Advance Directive? No No No No No No No  Would patient like information on creating a medical advance directive? Yes (ED - Information included in AVS)  No - Patient declined No - Patient declined No - Patient declined No - Patient declined No - Patient declined    Current Medications (verified) Outpatient Encounter Medications as of 01/27/2022  Medication Sig   atorvastatin (LIPITOR) 80 MG tablet TAKE 1 TABLET BY MOUTH EVERY DAY (Patient taking differently: Take 80 mg by mouth daily.)   benazepril (LOTENSIN) 20 MG tablet TAKE 1 TABLET BY MOUTH EVERY DAY   cholecalciferol (VITAMIN D3) 25 MCG (1000 UNIT) tablet Take 1,000 Units by mouth daily.   diltiazem (CARDIZEM CD) 120 MG 24 hr capsule TAKE 1 CAPSULE BY MOUTH EVERY DAY   furosemide (LASIX) 20 MG tablet TAKE 1 TABLET (20 MG TOTAL) BY MOUTH DAILY AS NEEDED FOR FLUID.   metFORMIN (GLUCOPHAGE-XR) 500 MG 24 hr tablet TAKE 1 TABLET BY MOUTH EVERY DAY WITH BREAKFAST (Patient taking differently: Take 500 mg by mouth daily with breakfast.)   metoprolol (TOPROL XL) 200 MG 24 hr tablet Take 1 tablet (200 mg total) by mouth daily.   montelukast (SINGULAIR) 10 MG tablet TAKE 1 TABLET BY MOUTH EVERYDAY AT BEDTIME   polyethylene glycol-electrolytes (TRILYTE) 420 g solution Take 4,000 mLs by mouth as directed. (Patient not taking: Reported on 10/30/2021)   sacubitril-valsartan (ENTRESTO) 24-26 MG Take 1 tablet by  mouth 2 (two) times daily.   terbinafine (LAMISIL) 250 MG tablet Take 1 tablet (250 mg total) by mouth daily.   warfarin (COUMADIN) 5 MG tablet TAKE 1 TABLET DAILY EXCEPT 1/2 TABLET ON SUNDAY AND THURSDAY OR AS DIRECTED BY COUMADIN CLINIC   No facility-administered encounter medications on file as of 01/27/2022.    Allergies (verified) Patient has no known allergies.   History: Past Medical History:  Diagnosis Date   Annual physical exam 06/07/2015   Aortic atherosclerosis ([place])    Arthritis    Baker's cyst of knee 10/04/2013   Diabetes mellitus    Hyperlipidemia    Hypertension    Metabolic syndrome X 24/58/0998   Mild carotid artery disease (HCC)    OA (osteoarthritis) of knee 05/05/2012   Left, takes tramadol on avg twice per week   Obesity    PAF (paroxysmal atrial fibrillation) (HCC)    Persistent atrial fibrillation (HCC)    Right bundle branch block    +Palpitations.   EKG 11/2006:  NSR, RBBB, LAFB, markedly delayed R-wave progression, no change; Echocardiogram in 10/2006-suboptimal quality, normal; Event recorder-PVCs, no significant arrhythmias or symptoms ;   TIA (transient ischemic attack) 06/02/2019   Unspecified visual loss 12/08/2008   Past Surgical History:  Procedure Laterality Date   ABDOMINAL HYSTERECTOMY     BREAST BIOPSY Left    benign   CATARACT EXTRACTION W/PHACO  Right 01/08/2016   Procedure: CATARACT EXTRACTION PHACO AND INTRAOCULAR LENS PLACEMENT (IOC);  Surgeon: Rutherford Guys, MD;  Location: [facility];  Service: Ophthalmology;  Laterality: Right;  CDE: 5.78   CATARACT EXTRACTION W/PHACO Left 02/05/2016   Procedure: CATARACT EXTRACTION PHACO AND INTRAOCULAR LENS PLACEMENT LEFT EYE CDE=6.50;  Surgeon: Rutherford Guys, MD;  Location: [facility];  Service: Ophthalmology;  Laterality: Left;   COLONOSCOPY  2006   Dr. Tamala Julian: Normal, repeat in 2009.   COLONOSCOPY N/A 10/08/2015   Procedure: COLONOSCOPY;  Surgeon: Danie Binder, MD;  Location: [facility];  Service:  Endoscopy;  Laterality: N/A;  230 - moved to 1:30, spoke with pt    COLONOSCOPY WITH PROPOFOL N/A 01/28/2021   Procedure: COLONOSCOPY WITH PROPOFOL;  Surgeon: Eloise Harman, DO;  Location: [facility];  Service: Endoscopy;  Laterality: N/A;  9:45am   POLYPECTOMY  10/08/2015   Procedure: POLYPECTOMY;  Surgeon: Danie Binder, MD;  Location: [facility];  Service: Endoscopy;;  cecal polyp removed via cold forceps with one clip placed/ rectal polyp removed via cold forcep   POLYPECTOMY  01/28/2021   Procedure: POLYPECTOMY;  Surgeon: Eloise Harman, DO;  Location: [facility];  Service: Endoscopy;;   SHOULDER SURGERY  2006   Left-post-trauma   TOTAL ABDOMINAL HYSTERECTOMY W/ BILATERAL SALPINGOOPHORECTOMY     Family History  Problem Relation Age of Onset   Hypertension Mother    Arthritis Mother    Stroke Father    Hypertension Father    Coronary artery disease Father    Diabetes Sister    Sarcoidosis Sister    Diabetes Sister    Heart failure Brother    Congestive Heart Failure Brother    Colon cancer Neg Hx    Social History   Socioeconomic History   Marital status: Married    Spouse name: Not on file   Number of children: 0   Years of education: Not on file   Highest education level: Not on file  Occupational History   Occupation: Museum/gallery exhibitions officer: UNEMPLOYED  Tobacco Use   Smoking status: Never   Smokeless tobacco: Never  Vaping Use   Vaping Use: Never used  Substance and Sexual Activity   Alcohol use: No   Drug use: No   Sexual activity: Yes    Birth control/protection: Surgical  Other Topics Concern   Not on file  Social History Narrative   Not on file   Social Determinants of Health   Financial Resource Strain: Low Risk  (01/27/2022)   Overall Financial Resource Strain (CARDIA)    Difficulty of Paying Living Expenses: Not hard at all  Food Insecurity: No Food Insecurity (01/27/2022)   Hunger Vital Sign    Worried About Running Out of  Food in the Last Year: Never true    Ran Out of Food in the Last Year: Never true  Transportation Needs: No Transportation Needs (01/27/2022)   PRAPARE - Hydrologist (Medical): No    Lack of Transportation (Non-Medical): No  Physical Activity: Inactive (01/27/2022)   Exercise Vital Sign    Days of Exercise per Week: 0 days    Minutes of Exercise per Session: 0 min  Stress: No Stress Concern Present (01/21/2021)   [place]    Feeling of Stress : Not at all  Social Connections: Moderately Integrated (01/27/2022)   Social Connection and Isolation Panel [NHANES]  Frequency of Communication with Friends and Family: More than three times a week    Frequency of Social Gatherings with Friends and Family: More than three times a week    Attends Religious Services: More than 4 times per year    Active Member of [employer] or Organizations: No    Attends Archivist Meetings: Never    Marital Status: Married    Tobacco Counseling Counseling given: Not Answered   Clinical Intake:              How often do you need to have someone help you when you read instructions, pamphlets, or other written materials from your doctor or pharmacy?: 1 - Never  Diabetic? Nutrition Risk Assessment:  Has the patient had any N/V/D within the last 2 months?  No  Does the patient have any non-healing wounds?  No  Has the patient had any unintentional weight loss or weight gain?  No   Diabetes:  Is the patient diabetic?  Yes  If diabetic, was a CBG obtained today?  No  Did the patient bring in their glucometer from home?  No  How often do you monitor your CBG's? Doesn't have to .   Financial Strains and Diabetes Management:  Are you having any financial strains with the device, your supplies or your medication? No .  Does the patient want to be seen by Chronic Care Management for management of their  diabetes?  No  Would the patient like to be referred to a Nutritionist or for Diabetic Management?  No   Diabetic Exams:  Diabetic Eye Exam: Completed   Diabetic Foot Exam: Completed      Interpreter Needed?: No      Activities of Daily Living    01/27/2022    8:11 AM  In your present state of health, do you have any difficulty performing the following activities:  Hearing? 0  Vision? 0  Difficulty concentrating or making decisions? 0  Walking or climbing stairs? 0  Dressing or bathing? 0  Doing errands, shopping? 0  Preparing Food and eating ? N  Using the Toilet? N  In the past six months, have you accidently leaked urine? N  Do you have problems with loss of bowel control? N  Managing your Medications? N  Managing your Finances? N  Housekeeping or managing your Housekeeping? N    Patient Care Team: Fayrene Helper, MD as PCP - General Branch, Alphonse Guild, MD as PCP - Cardiology (Cardiology) Harl Bowie Alphonse Guild, MD as Consulting Physician (Cardiology) Eloise Harman, DO as Consulting Physician (Internal Medicine)  Indicate any recent Medical Services you may have received from other than [facility] providers in the past year (date may be approximate).     Assessment:   This is a routine wellness examination for Montine.  Hearing/Vision screen No results found.  Dietary issues and exercise activities discussed: Current Exercise Habits: The patient does not participate in regular exercise at present   Goals Addressed   None    Depression Screen    11/01/2021    8:17 AM 09/18/2021    1:51 PM 08/13/2021    9:44 AM 05/03/2021    2:25 PM 03/08/2021    2:31 PM 01/21/2021    8:17 AM 01/21/2021    8:13 AM  PHQ 2/9 Scores  PHQ - 2 Score 0 0 0 0 0 0 0  PHQ- 9 Score    0       Fall Risk  01/27/2022    8:11 AM 11/01/2021    8:17 AM 09/18/2021    1:51 PM 08/13/2021    8:55 AM 05/03/2021    2:25 PM  Fall Risk   Falls in the past year? 0 0 0 0 0  Number falls in  past yr: 0 0 0 0 0  Injury with Fall? 0 0 0 0 0  Risk for fall due to :  No Fall Risks No Fall Risks    Follow up  Falls evaluation completed Falls evaluation completed      FALL RISK PREVENTION PERTAINING TO THE HOME:  Any stairs in or around the home? No  If so, are there any without handrails? No  Home free of loose throw rugs in walkways, pet beds, electrical cords, etc? Yes  Adequate lighting in your home to reduce risk of falls? Yes   ASSISTIVE DEVICES UTILIZED TO PREVENT FALLS:  Life alert? No  Use of a cane, walker or w/c? No  Grab bars in the bathroom? No  Shower chair or bench in shower? Yes  Elevated toilet seat or a handicapped toilet? No   TIMED UP AND GO:  Was the test performed? Yes .  Length of time to ambulate 10 feet: 9 sec.   Gait slow and steady without use of assistive device  Cognitive Function:        01/27/2022    8:12 AM 01/21/2021    8:22 AM 01/19/2020    8:25 AM 01/18/2019    8:22 AM 01/12/2018    8:19 AM  6CIT Screen  What Year? 0 points 0 points 0 points 0 points 0 points  What month? 0 points 0 points 0 points 0 points 0 points  What time? 0 points 3 points 0 points 0 points 0 points  Count back from 20 0 points 4 points 4 points 4 points 2 points  Months in reverse 2 points 4 points 4 points 4 points 4 points  Repeat phrase 10 points 2 points 10 points 0 points 0 points  Total Score 12 points 13 points 18 points 8 points 6 points    Immunizations Immunization History  Administered Date(s) Administered   Fluad Quad(high Dose 65+) 04/04/2019, 02/21/2020, 03/08/2021   Influenza Split 04/22/2011, 04/16/2012   Influenza Whole 03/23/2007, 05/18/2009   Influenza,inj,Quad PF,6+ Mos 04/25/2013, 05/30/2014, 06/07/2015, 02/12/2016, 04/13/2017, 02/25/2018   Moderna Sars-Covid-2 Vaccination 09/02/2019, 10/05/2019, 06/01/2020, 01/04/2021   Pneumococcal Conjugate-13 09/06/2014   Pneumococcal Polysaccharide-23 10/31/2009, 02/12/2016   Td 06/17/2003    Tdap 04/22/2011   Zoster, Live 05/18/2009    TDAP status: Due, Education has been provided regarding the importance of this vaccine. Advised may receive this vaccine at local pharmacy or Health Dept. Aware to provide a copy of the vaccination record if obtained from local pharmacy or Health Dept. Verbalized acceptance and understanding.  Flu Vaccine status: Up to date  Pneumococcal vaccine status: Up to date  Covid-19 vaccine status: Completed vaccines  Qualifies for Shingles Vaccine? Yes   Zostavax completed No   Shingrix Completed?: Yes  Screening Tests Health Maintenance  Topic Date Due   COVID-19 Vaccine (5 - Moderna risk series) 03/01/2021   TETANUS/TDAP  04/21/2021   Zoster Vaccines- Shingrix (2 of 2) 01/06/2022   INFLUENZA VACCINE  01/14/2022   HEMOGLOBIN A1C  02/10/2022   OPHTHALMOLOGY EXAM  09/11/2022   FOOT EXAM  11/26/2022   MAMMOGRAM  12/07/2023   COLONOSCOPY (Pts 45-51yr Insurance coverage will need to be confirmed)  01/28/2026   Pneumonia Vaccine 43+ Years old  Completed   DEXA SCAN  Completed   Hepatitis C Screening  Completed   HPV VACCINES  Aged Out    Health Maintenance  Health Maintenance Due  Topic Date Due   COVID-19 Vaccine (5 - Moderna risk series) 03/01/2021   TETANUS/TDAP  04/21/2021   Zoster Vaccines- Shingrix (2 of 2) 01/06/2022   INFLUENZA VACCINE  01/14/2022    Colorectal cancer screening: Type of screening: Colonoscopy. Completed yes 2022. Repeat every 5 years  Mammogram status: Completed  . Repeat every year    Lung Cancer Screening: (Low Dose CT Chest recommended if Age 64-80 years, 30 pack-year currently smoking OR have quit w/in 15years.) does not qualify.   Lung Cancer Screening Referral:   Additional Screening:  Hepatitis C Screening: does not qualify; Completed   Vision Screening: Recommended annual ophthalmology exams for early detection of glaucoma and other disorders of the eye. Is the patient up to date with their  annual eye exam?  Yes  Who is the provider or what is the name of the office in which the patient attends annual eye exams? [email or web address] If pt is not established with a provider, would they like to be referred to a provider to establish care? No .   Dental Screening: Recommended annual dental exams for proper oral hygiene  Community Resource Referral / Chronic Care Management: CRR required this visit?  No   CCM required this visit?  No      Plan:     I have personally reviewed and noted the following in the patient's chart:   Medical and social history Use of alcohol, tobacco or illicit drugs  Current medications and supplements including opioid prescriptions.  Functional ability and status Nutritional status Physical activity Advanced directives List of other physicians Hospitalizations, surgeries, and ER visits in previous 12 months Vitals Screenings to include cognitive, depression, and falls Referrals and appointments  In addition, I have reviewed and discussed with patient certain preventive protocols, quality metrics, and best practice recommendations. A written personalized care plan for preventive services as well as general preventive health recommendations were provided to patient.     Eual Fines, LPN   9/37/3428   Nurse Notes: Schedule your next wellness visit for 1 year at checkout

## 2022-01-30 ENCOUNTER — Ambulatory Visit (INDEPENDENT_AMBULATORY_CARE_PROVIDER_SITE_OTHER): Payer: Medicare Other | Admitting: *Deleted

## 2022-01-30 DIAGNOSIS — I480 Paroxysmal atrial fibrillation: Secondary | ICD-10-CM

## 2022-01-30 DIAGNOSIS — Z5181 Encounter for therapeutic drug level monitoring: Secondary | ICD-10-CM

## 2022-01-30 LAB — POCT INR: INR: 1.5 — AB (ref 2.0–3.0)

## 2022-01-30 NOTE — Patient Instructions (Addendum)
Description    Take 1 tablet of warfarin today and 1.5 tablets of warfarin tomorrow, then continue to take 1 tablet daily excpet for 1/2 a tablet on Tuesday, Thursday and Saturday. Recheck INR in 1.5 weeks.

## 2022-02-10 ENCOUNTER — Ambulatory Visit: Payer: Medicare Other | Attending: Cardiology | Admitting: *Deleted

## 2022-02-10 DIAGNOSIS — Z5181 Encounter for therapeutic drug level monitoring: Secondary | ICD-10-CM

## 2022-02-10 DIAGNOSIS — I480 Paroxysmal atrial fibrillation: Secondary | ICD-10-CM

## 2022-02-10 LAB — POCT INR: INR: 1.8 — AB (ref 2.0–3.0)

## 2022-02-10 NOTE — Patient Instructions (Signed)
Take warfarin 1 1/2 tablets tonight then increase dose to 1 tablet daily except 1/2 tablet on Tuesdays and Saturdays.  Recheck INR in 3 wks.

## 2022-02-12 ENCOUNTER — Other Ambulatory Visit: Payer: Self-pay | Admitting: Family Medicine

## 2022-02-13 ENCOUNTER — Ambulatory Visit (INDEPENDENT_AMBULATORY_CARE_PROVIDER_SITE_OTHER): Payer: Medicare Other | Admitting: Family Medicine

## 2022-02-13 ENCOUNTER — Encounter: Payer: Self-pay | Admitting: Family Medicine

## 2022-02-13 VITALS — BP 128/86 | HR 91 | Resp 16 | Ht 62.0 in | Wt 194.1 lb

## 2022-02-13 DIAGNOSIS — H1013 Acute atopic conjunctivitis, bilateral: Secondary | ICD-10-CM | POA: Diagnosis not present

## 2022-02-13 DIAGNOSIS — E785 Hyperlipidemia, unspecified: Secondary | ICD-10-CM

## 2022-02-13 DIAGNOSIS — H538 Other visual disturbances: Secondary | ICD-10-CM | POA: Diagnosis not present

## 2022-02-13 DIAGNOSIS — E1159 Type 2 diabetes mellitus with other circulatory complications: Secondary | ICD-10-CM | POA: Diagnosis not present

## 2022-02-13 DIAGNOSIS — I10 Essential (primary) hypertension: Secondary | ICD-10-CM | POA: Diagnosis not present

## 2022-02-13 MED ORDER — OFLOXACIN 0.3 % OP SOLN: 1.0000 [drp] | Freq: Four times a day (QID) | OPHTHALMIC | 0 refills | Status: AC

## 2022-02-13 NOTE — Assessment & Plan Note (Signed)
Controlled, no change in medication DASH diet and commitment to daily physical activity for a minimum of 30 minutes discussed and encouraged, as a part of hypertension management. The importance of attaining a healthy weight is also discussed.     02/13/2022    8:07 AM 11/22/2021   10:04 AM 11/01/2021    8:17 AM 10/30/2021    4:33 PM 10/30/2021    3:52 PM 10/28/2021    2:52 PM 10/15/2021    3:16 PM  BP/Weight  Systolic BP 184 037 543 606 770 340 352  Diastolic BP 86 78 80 72 72 78 81  Wt. (Lbs) 194.12 191.8 194.08  194 194.6   BMI 35.51 kg/m2 35.08 kg/m2 35.5 kg/m2  35.48 kg/m2 35.59 kg/m2

## 2022-02-13 NOTE — Assessment & Plan Note (Signed)
Hyperlipidemia:Low fat diet discussed and encouraged.   Lipid Panel  Lab Results  Component Value Date   CHOL 146 08/13/2021   HDL 36 (L) 08/13/2021   LDLCALC 92 08/13/2021   TRIG 97 08/13/2021   CHOLHDL 4.1 08/13/2021     Updated lab needed at/ before next visit.

## 2022-02-13 NOTE — Assessment & Plan Note (Signed)
10 day h/o itchy draining eyes and feels as though she has a ski over left eye

## 2022-02-13 NOTE — Assessment & Plan Note (Signed)
Samantha Vega is reminded of the importance of commitment to daily physical activity for 30 minutes or more, as able and the need to limit carbohydrate intake to 30 to 60 grams per meal to help with blood sugar control.   The need to take medication as prescribed, test blood sugar as directed, and to call between visits if there is a concern that blood sugar is uncontrolled is also discussed.   Samantha Vega is reminded of the importance of daily foot exam, annual eye examination, and good blood sugar, blood pressure and cholesterol control.     Latest Ref Rng & Units 10/29/2021    8:27 AM 10/15/2021   11:27 AM 09/27/2021   11:00 AM 09/06/2021   11:37 AM 08/13/2021   10:27 AM  Diabetic Labs  HbA1c 4.8 - 5.6 %     6.3   Micro/Creat Ratio mg/g creat     CANCELED   Chol 100 - 199 mg/dL     146   HDL >39 mg/dL     36   Calc LDL 0 - 99 mg/dL     92   Triglycerides 0 - 149 mg/dL     97   Creatinine 0.57 - 1.00 mg/dL 0.85  1.09  0.79  0.79  0.81       02/13/2022    8:07 AM 11/22/2021   10:04 AM 11/01/2021    8:17 AM 10/30/2021    4:33 PM 10/30/2021    3:52 PM 10/28/2021    2:52 PM 10/15/2021    3:16 PM  BP/Weight  Systolic BP 027 741 287 867 672 094 709  Diastolic BP 86 78 80 72 72 78 81  Wt. (Lbs) 194.12 191.8 194.08  194 194.6   BMI 35.51 kg/m2 35.08 kg/m2 35.5 kg/m2  35.48 kg/m2 35.59 kg/m2       Latest Ref Rng & Units 11/01/2021    8:20 AM 09/10/2021   12:00 AM  Foot/eye exam completion dates  Eye Exam No Retinopathy  No Retinopathy      Foot Form Completion  Done      This result is from an external source.   Updated lab needed at/ before next visit.

## 2022-02-13 NOTE — Progress Notes (Signed)
Samantha Vega     MRN: [id number]      DOB: 1951-03-02   HPI Samantha Vega is here for follow up and re-evaluation of chronic medical conditions, medication management and review of any available recent lab and radiology data.  Preventive health is updated, specifically  Cancer screening and Immunization.   Questions or concerns regarding consultations or procedures which the PT has had in the interim are  addressed. The PT denies any adverse reactions to current medications since the last visit.  10 day h/o itchy watery eyes and feels as though there is a skim over her left eye ROS Denies recent fever or chills. Denies sinus pressure, nasal congestion, ear pain or sore throat. Denies chest congestion, productive cough or wheezing. Denies chest pains, palpitations and leg swelling Denies abdominal pain, nausea, vomiting,diarrhea or constipation.   Denies dysuria, frequency, hesitancy or incontinence. Denies uncontrolled  joint pain, swelling and limitation in mobility. Denies headaches, seizures, numbness, or tingling. Denies depression, anxiety or insomnia. Denies skin break down or rash.   PE  BP 128/86   Pulse 91   Resp 16   Ht '5\' 2"'$  (1.575 m)   Wt 194 lb 1.9 oz (88.1 kg)   SpO2 96%   BMI 35.51 kg/m   Patient alert and oriented and in no cardiopulmonary distress.  HEENT: No facial asymmetry, EOMI,     Neck supple .watery eyes bilaterally, mild conjunctival erythema  Chest: Clear to auscultation bilaterally.  CVS: S1, S2 no murmurs, no S3.Regular rate.  ABD: Soft non tender.   Ext: No edema  MS: Adequate ROM spine, shoulders, hips and knees.  Skin: Intact, no ulcerations or rash noted.  Psych: Good eye contact, normal affect. Memory intact not anxious or depressed appearing.  CNS: CN 2-12 intact, power,  normal throughout.no focal deficits noted.   Assessment & Plan  Allergic conjunctivitis 10 day h/o itchy draining eyes and feels as though she has a ski  over left eye  Essential hypertension Controlled, no change in medication DASH diet and commitment to daily physical activity for a minimum of 30 minutes discussed and encouraged, as a part of hypertension management. The importance of attaining a healthy weight is also discussed.     02/13/2022    8:07 AM 11/22/2021   10:04 AM 11/01/2021    8:17 AM 10/30/2021    4:33 PM 10/30/2021    3:52 PM 10/28/2021    2:52 PM 10/15/2021    3:16 PM  BP/Weight  Systolic BP 440 347 425 956 387 564 332  Diastolic BP 86 78 80 72 72 78 81  Wt. (Lbs) 194.12 191.8 194.08  194 194.6   BMI 35.51 kg/m2 35.08 kg/m2 35.5 kg/m2  35.48 kg/m2 35.59 kg/m2        Type 2 diabetes mellitus with vascular disease ([place]) Samantha Vega is reminded of the importance of commitment to daily physical activity for 30 minutes or more, as able and the need to limit carbohydrate intake to 30 to 60 grams per meal to help with blood sugar control.   The need to take medication as prescribed, test blood sugar as directed, and to call between visits if there is a concern that blood sugar is uncontrolled is also discussed.   Samantha Vega is reminded of the importance of daily foot exam, annual eye examination, and good blood sugar, blood pressure and cholesterol control.     Latest Ref Rng & Units 10/29/2021    8:27 AM  10/15/2021   11:27 AM 09/27/2021   11:00 AM 09/06/2021   11:37 AM 08/13/2021   10:27 AM  Diabetic Labs  HbA1c 4.8 - 5.6 %     6.3   Micro/Creat Ratio mg/g creat     CANCELED   Chol 100 - 199 mg/dL     146   HDL >39 mg/dL     36   Calc LDL 0 - 99 mg/dL     92   Triglycerides 0 - 149 mg/dL     97   Creatinine 0.57 - 1.00 mg/dL 0.85  1.09  0.79  0.79  0.81       02/13/2022    8:07 AM 11/22/2021   10:04 AM 11/01/2021    8:17 AM 10/30/2021    4:33 PM 10/30/2021    3:52 PM 10/28/2021    2:52 PM 10/15/2021    3:16 PM  BP/Weight  Systolic BP 993 570 177 939 030 092 330  Diastolic BP 86 78 80 72 72 78 81  Wt. (Lbs) 194.12  191.8 194.08  194 194.6   BMI 35.51 kg/m2 35.08 kg/m2 35.5 kg/m2  35.48 kg/m2 35.59 kg/m2       Latest Ref Rng & Units 11/01/2021    8:20 AM 09/10/2021   12:00 AM  Foot/eye exam completion dates  Eye Exam No Retinopathy  No Retinopathy      Foot Form Completion  Done      This result is from an external source.   Updated lab needed at/ before next visit.      Hyperlipidemia LDL goal <70 Hyperlipidemia:Low fat diet discussed and encouraged.   Lipid Panel  Lab Results  Component Value Date   CHOL 146 08/13/2021   HDL 36 (L) 08/13/2021   LDLCALC 92 08/13/2021   TRIG 97 08/13/2021   CHOLHDL 4.1 08/13/2021     Updated lab needed at/ before next visit.   Morbid obesity ([place])  Patient re-educated about  the importance of commitment to a  minimum of 150 minutes of exercise per week as able.  The importance of healthy food choices with portion control discussed, as well as eating regularly and within a 12 hour window most days. The need to choose "clean , green" food 50 to 75% of the time is discussed, as well as to make water the primary drink and set a goal of 64 ounces water daily.       02/13/2022    8:07 AM 11/22/2021   10:04 AM 11/01/2021    8:17 AM  Weight /BMI  Weight 194 lb 1.9 oz 191 lb 12.8 oz 194 lb 1.3 oz  Height '5\' 2"'$  (1.575 m) '5\' 2"'$  (1.575 m) '5\' 2"'$  (1.575 m)  BMI 35.51 kg/m2 35.08 kg/m2 35.5 kg/m2

## 2022-02-13 NOTE — Patient Instructions (Addendum)
Annual exam March 1 or after, call if you need me sooner   Antibiotoc eye drop is prescribed for 1 week and you are referred to [facility] specialist  Fasting labs today already ordered  Please get shingrix vaccine today  Nurse pls do vision screen at checkout  Thanks for choosing [facility], we consider it a privelige to serve you. HAPPY BIRTHDAY!!

## 2022-02-13 NOTE — Assessment & Plan Note (Signed)
  Patient re-educated about  the importance of commitment to a  minimum of 150 minutes of exercise per week as able.  The importance of healthy food choices with portion control discussed, as well as eating regularly and within a 12 hour window most days. The need to choose "clean , green" food 50 to 75% of the time is discussed, as well as to make water the primary drink and set a goal of 64 ounces water daily.       02/13/2022    8:07 AM 11/22/2021   10:04 AM 11/01/2021    8:17 AM  Weight /BMI  Weight 194 lb 1.9 oz 191 lb 12.8 oz 194 lb 1.3 oz  Height '5\' 2"'$  (1.575 m) '5\' 2"'$  (1.575 m) '5\' 2"'$  (1.575 m)  BMI 35.51 kg/m2 35.08 kg/m2 35.5 kg/m2

## 2022-02-14 LAB — LIPID PANEL
Chol/HDL Ratio: 4 ratio (ref 0.0–4.4)
Cholesterol, Total: 160 mg/dL (ref 100–199)
HDL: 40 mg/dL (ref >39)
LDL Chol Calc (NIH): 101 mg/dL — ABNORMAL HIGH (ref 0–99)
Triglycerides: 101 mg/dL (ref 0–149)
VLDL Cholesterol Cal: 19 mg/dL (ref 5–40)

## 2022-02-14 LAB — CMP14+EGFR
ALT: 24 IU/L (ref 0–32)
AST: 19 IU/L (ref 0–40)
Albumin/Globulin Ratio: 1.3 (ref 1.2–2.2)
Albumin: 4.1 g/dL (ref 3.8–4.8)
Alkaline Phosphatase: 126 IU/L — ABNORMAL HIGH (ref 44–121)
BUN/Creatinine Ratio: 20 (ref 12–28)
BUN: 19 mg/dL (ref 8–27)
Bilirubin Total: 0.3 mg/dL (ref 0.0–1.2)
CO2: 24 mmol/L (ref 20–29)
Calcium: 9.5 mg/dL (ref 8.7–10.3)
Chloride: 103 mmol/L (ref 96–106)
Creatinine, Ser: 0.93 mg/dL (ref 0.57–1.00)
Globulin, Total: 3.1 g/dL (ref 1.5–4.5)
Glucose: 99 mg/dL (ref 70–99)
Potassium: 4.6 mmol/L (ref 3.5–5.2)
Sodium: 140 mmol/L (ref 134–144)
Total Protein: 7.2 g/dL (ref 6.0–8.5)
eGFR: 65 mL/min/{1.73_m2} (ref >59)

## 2022-02-14 LAB — HEMOGLOBIN A1C
Est. average glucose Bld gHb Est-mCnc: 134 mg/dL
Hgb A1c MFr Bld: 6.3 % — ABNORMAL HIGH (ref 4.8–5.6)

## 2022-03-03 ENCOUNTER — Ambulatory Visit (HOSPITAL_COMMUNITY)
Admission: RE | Admit: 2022-03-03 | Discharge: 2022-03-03 | Disposition: A | Discharge status: Home / Self Care | Payer: Medicare Other | Source: Ambulatory Visit | Attending: Internal Medicine | Admitting: Internal Medicine

## 2022-03-03 ENCOUNTER — Emergency Department (HOSPITAL_COMMUNITY)
Admission: EM | Admit: 2022-03-03 | Discharge: 2022-03-03 | Discharge status: Against Medical Advice | Payer: Medicare Other

## 2022-03-03 ENCOUNTER — Ambulatory Visit: Payer: Medicare Other | Admitting: Podiatry

## 2022-03-03 ENCOUNTER — Ambulatory Visit: Payer: Medicare Other | Attending: Cardiology | Admitting: *Deleted

## 2022-03-03 ENCOUNTER — Ambulatory Visit (INDEPENDENT_AMBULATORY_CARE_PROVIDER_SITE_OTHER): Payer: Medicare Other | Admitting: Internal Medicine

## 2022-03-03 ENCOUNTER — Encounter: Payer: Self-pay | Admitting: Internal Medicine

## 2022-03-03 VITALS — BP 122/82 | HR 98

## 2022-03-03 DIAGNOSIS — I480 Paroxysmal atrial fibrillation: Secondary | ICD-10-CM | POA: Diagnosis not present

## 2022-03-03 DIAGNOSIS — Z5181 Encounter for therapeutic drug level monitoring: Secondary | ICD-10-CM

## 2022-03-03 DIAGNOSIS — M1710 Unilateral primary osteoarthritis, unspecified knee: Secondary | ICD-10-CM | POA: Insufficient documentation

## 2022-03-03 DIAGNOSIS — M25562 Pain in left knee: Secondary | ICD-10-CM | POA: Diagnosis not present

## 2022-03-03 DIAGNOSIS — Z7901 Long term (current) use of anticoagulants: Secondary | ICD-10-CM | POA: Diagnosis not present

## 2022-03-03 LAB — POCT INR: INR: 1.5 — AB (ref 2.0–3.0)

## 2022-03-03 MED ORDER — TRAMADOL HCL 50 MG PO TABS: 50.0000 mg | ORAL_TABLET | Freq: Three times a day (TID) | ORAL | 0 refills | Status: AC | PRN

## 2022-03-03 NOTE — Patient Instructions (Signed)
Please take Tramadol as needed for knee pain.  Okay to take Tylenol as needed for mild-moderate pain.  Please get X-ray of knee done at [facility].

## 2022-03-03 NOTE — Assessment & Plan Note (Signed)
INR - 1.5 Had visit with [facility] clinic today, dose of Coumadin adjusted

## 2022-03-03 NOTE — Assessment & Plan Note (Signed)
Acute on chronic left knee pain - likely OA of knee Unable to take oral NSAIDs due to chronic AC Tramadol as needed for severe pain Tylenol as needed for mild to moderate pain Referred to Orthopedic surgery

## 2022-03-03 NOTE — Progress Notes (Signed)
Acute Office Visit  Subjective:    Patient ID: Samantha Vega, female    DOB: Sep 16, 1950, 71 y.o.   MRN: [id number]  Chief Complaint  Patient presents with   Leg Pain    Patient having left leg pain has been going on for awhile but got really bad 03-03-22 went to ER but too long of a weight patient cannot put weight on leg or it will give out     HPI Patient is in today for complaint of left knee pain, acute on chronic, worse since this morning, 8/10, worse with standing and walking.  She has history of knee OA, and has had steroid injection in the right knee in the past.  She has history of paroxysmal A-fib - on chronic AC, HFrEF and type II DM.  She is unable to take oral NSAIDs.  She denies any recent injury or fall.  Denies any numbness or tingling of the LE.  She reports that her legs give out due to severe knee pain.  She went to Coumadin clinic this morning and was told to get medical evaluation for acute knee pain.  Past Medical History:  Diagnosis Date   Annual physical exam 06/07/2015   Aortic atherosclerosis ([place])    Arthritis    Baker's cyst of knee 10/04/2013   Diabetes mellitus    Hyperlipidemia    Hypertension    Metabolic syndrome X 81/82/9937   Mild carotid artery disease (HCC)    OA (osteoarthritis) of knee 05/05/2012   Left, takes tramadol on avg twice per week   Obesity    PAF (paroxysmal atrial fibrillation) (HCC)    Persistent atrial fibrillation (HCC)    Right bundle branch block    +Palpitations.   EKG 11/2006:  NSR, RBBB, LAFB, markedly delayed R-wave progression, no change; Echocardiogram in 10/2006-suboptimal quality, normal; Event recorder-PVCs, no significant arrhythmias or symptoms ;   TIA (transient ischemic attack) 06/02/2019   Unspecified visual loss 12/08/2008    Past Surgical History:  Procedure Laterality Date   ABDOMINAL HYSTERECTOMY     BREAST BIOPSY Left    benign   CATARACT EXTRACTION W/PHACO Right 01/08/2016   Procedure: CATARACT  EXTRACTION PHACO AND INTRAOCULAR LENS PLACEMENT ([place]);  Surgeon: Rutherford Guys, MD;  Location: [facility];  Service: Ophthalmology;  Laterality: Right;  CDE: 5.78   CATARACT EXTRACTION W/PHACO Left 02/05/2016   Procedure: CATARACT EXTRACTION PHACO AND INTRAOCULAR LENS PLACEMENT LEFT EYE CDE=6.50;  Surgeon: Rutherford Guys, MD;  Location: [facility];  Service: Ophthalmology;  Laterality: Left;   COLONOSCOPY  2006   Dr. Tamala Julian: Normal, repeat in 2009.   COLONOSCOPY N/A 10/08/2015   Procedure: COLONOSCOPY;  Surgeon: Danie Binder, MD;  Location: [facility];  Service: Endoscopy;  Laterality: N/A;  230 - moved to 1:30, spoke with pt    COLONOSCOPY WITH PROPOFOL N/A 01/28/2021   Procedure: COLONOSCOPY WITH PROPOFOL;  Surgeon: Eloise Harman, DO;  Location: [facility];  Service: Endoscopy;  Laterality: N/A;  9:45am   POLYPECTOMY  10/08/2015   Procedure: POLYPECTOMY;  Surgeon: Danie Binder, MD;  Location: [facility];  Service: Endoscopy;;  cecal polyp removed via cold forceps with one clip placed/ rectal polyp removed via cold forcep   POLYPECTOMY  01/28/2021   Procedure: POLYPECTOMY;  Surgeon: Eloise Harman, DO;  Location: [facility];  Service: Endoscopy;;   SHOULDER SURGERY  2006   Left-post-trauma   TOTAL ABDOMINAL HYSTERECTOMY W/ BILATERAL SALPINGOOPHORECTOMY  Family History  Problem Relation Age of Onset   Hypertension Mother    Arthritis Mother    Stroke Father    Hypertension Father    Coronary artery disease Father    Diabetes Sister    Sarcoidosis Sister    Diabetes Sister    Heart failure Brother    Congestive Heart Failure Brother    Colon cancer Neg Hx     Social History   Socioeconomic History   Marital status: Married    Spouse name: Not on file   Number of children: 0   Years of education: Not on file   Highest education level: Not on file  Occupational History   Occupation: Museum/gallery exhibitions officer: UNEMPLOYED  Tobacco Use   Smoking status: Never    Smokeless tobacco: Never  Vaping Use   Vaping Use: Never used  Substance and Sexual Activity   Alcohol use: No   Drug use: No   Sexual activity: Yes    Birth control/protection: Surgical  Other Topics Concern   Not on file  Social History Narrative   Not on file   Social Determinants of Health   Financial Resource Strain: Low Risk  (01/27/2022)   Overall Financial Resource Strain (CARDIA)    Difficulty of Paying Living Expenses: Not hard at all  Food Insecurity: No Food Insecurity (01/27/2022)   Hunger Vital Sign    Worried About Running Out of Food in the Last Year: Never true    Ran Out of Food in the Last Year: Never true  Transportation Needs: No Transportation Needs (01/27/2022)   PRAPARE - Hydrologist (Medical): No    Lack of Transportation (Non-Medical): No  Physical Activity: Inactive (01/27/2022)   Exercise Vital Sign    Days of Exercise per Week: 0 days    Minutes of Exercise per Session: 0 min  Stress: No Stress Concern Present (01/21/2021)   [place]    Feeling of Stress : Not at all  Social Connections: Moderately Integrated (01/27/2022)   Social Connection and Isolation Panel [NHANES]    Frequency of Communication with Friends and Family: More than three times a week    Frequency of Social Gatherings with Friends and Family: More than three times a week    Attends Religious Services: More than 4 times per year    Active Member of [employer] or Organizations: No    Attends Archivist Meetings: Never    Marital Status: Married  Human resources officer Violence: Not At Risk (01/21/2021)   Humiliation, Afraid, Rape, and Kick questionnaire    Fear of Current or Ex-Partner: No    Emotionally Abused: No    Physically Abused: No    Sexually Abused: No    Outpatient Medications Prior to Visit  Medication Sig Dispense Refill   atorvastatin (LIPITOR) 80 MG tablet TAKE 1 TABLET  BY MOUTH EVERY DAY (Patient taking differently: Take 80 mg by mouth daily.) 90 tablet 3   benazepril (LOTENSIN) 20 MG tablet TAKE 1 TABLET BY MOUTH EVERY DAY 90 tablet 1   cholecalciferol (VITAMIN D3) 25 MCG (1000 UNIT) tablet Take 1,000 Units by mouth daily.     diltiazem (CARDIZEM CD) 120 MG 24 hr capsule TAKE 1 CAPSULE BY MOUTH EVERY DAY 90 capsule 2   furosemide (LASIX) 20 MG tablet TAKE 1 TABLET (20 MG TOTAL) BY MOUTH DAILY AS NEEDED FOR FLUID. 90 tablet 1  metFORMIN (GLUCOPHAGE-XR) 500 MG 24 hr tablet TAKE 1 TABLET BY MOUTH EVERY DAY WITH BREAKFAST 90 tablet 1   metoprolol (TOPROL XL) 200 MG 24 hr tablet Take 1 tablet (200 mg total) by mouth daily. 90 tablet 3   montelukast (SINGULAIR) 10 MG tablet TAKE 1 TABLET BY MOUTH EVERYDAY AT BEDTIME 90 tablet 1   sacubitril-valsartan (ENTRESTO) 24-26 MG Take 1 tablet by mouth 2 (two) times daily. 60 tablet 6   terbinafine (LAMISIL) 250 MG tablet TAKE 1 TABLET BY MOUTH EVERY DAY 42 tablet 1   warfarin (COUMADIN) 5 MG tablet TAKE 1 TABLET DAILY EXCEPT 1/2 TABLET ON SUNDAY AND THURSDAY OR AS DIRECTED BY COUMADIN CLINIC 100 tablet 3   No facility-administered medications prior to visit.    No Known Allergies  Review of Systems  Constitutional:  Negative for chills and fever.  Respiratory:  Negative for cough and shortness of breath.   Cardiovascular:  Negative for chest pain and palpitations.  Gastrointestinal:  Negative for nausea and vomiting.  Genitourinary:  Negative for dysuria and hematuria.  Musculoskeletal:  Positive for arthralgias (Left knee) and joint swelling.  Skin:  Negative for rash.  Neurological:  Positive for weakness (Left leg). Negative for dizziness.  Psychiatric/Behavioral:  Negative for agitation and behavioral problems.        Objective:    Physical Exam Constitutional:      Appearance: She is not diaphoretic.     Comments: In wheelchair  HENT:     Mouth/Throat:     Mouth: Mucous membranes are moist.      Pharynx: No posterior oropharyngeal erythema.  Eyes:     General: No scleral icterus.    Extraocular Movements: Extraocular movements intact.  Cardiovascular:     Rate and Rhythm: Normal rate and regular rhythm.     Heart sounds: Normal heart sounds. No murmur heard. Pulmonary:     Breath sounds: Normal breath sounds. No wheezing or rales.  Musculoskeletal:        General: Tenderness (Left knee, with swelling) present.     Comments: ROM limited at left knee due to pain  Neurological:     General: No focal deficit present.     Mental Status: She is alert and oriented to person, place, and time.  Psychiatric:        Mood and Affect: Mood normal.        Behavior: Behavior normal.     BP 122/82 (BP Location: Right Arm, Patient Position: Sitting, Cuff Size: Normal)   Pulse 98   SpO2 96%  Wt Readings from Last 3 Encounters:  02/13/22 194 lb 1.9 oz (88.1 kg)  11/22/21 191 lb 12.8 oz (87 kg)  11/01/21 194 lb 1.3 oz (88 kg)        Assessment & Plan:   Problem List Items Addressed This Visit       Musculoskeletal and Integument   Arthritis of knee - Primary    Acute on chronic left knee pain - likely OA of knee Unable to take oral NSAIDs due to chronic AC Tramadol as needed for severe pain Tylenol as needed for mild to moderate pain Referred to Orthopedic surgery       Relevant Medications   traMADol (ULTRAM) 50 MG tablet   Other Relevant Orders   Ambulatory referral to Orthopedic Surgery   DG Knee Complete 4 Views Left     Other   Chronic anticoagulation    INR - 1.5 Had visit with [facility] clinic  today, dose of Coumadin adjusted        Meds ordered this encounter  Medications   traMADol (ULTRAM) 50 MG tablet    Sig: Take 1 tablet (50 mg total) by mouth every 8 (eight) hours as needed for up to 5 days.    Dispense:  15 tablet    Refill:  0     Moe Graca Keith Rake, MD

## 2022-03-03 NOTE — Patient Instructions (Signed)
Take warfarin 1 1/2 tablets tonight then increase dose to 1 tablet daily  Recheck INR in 2 wks.

## 2022-03-04 ENCOUNTER — Ambulatory Visit: Payer: Medicare Other | Admitting: Podiatry

## 2022-03-05 ENCOUNTER — Ambulatory Visit: Payer: Medicare Other | Attending: Student | Admitting: Student

## 2022-03-05 ENCOUNTER — Encounter: Payer: Self-pay | Admitting: Student

## 2022-03-05 VITALS — BP 122/68 | HR 93 | Ht 62.0 in | Wt 198.0 lb

## 2022-03-05 DIAGNOSIS — I10 Essential (primary) hypertension: Secondary | ICD-10-CM | POA: Diagnosis not present

## 2022-03-05 DIAGNOSIS — E782 Mixed hyperlipidemia: Secondary | ICD-10-CM | POA: Diagnosis not present

## 2022-03-05 DIAGNOSIS — Z79899 Other long term (current) drug therapy: Secondary | ICD-10-CM | POA: Diagnosis not present

## 2022-03-05 DIAGNOSIS — I5022 Chronic systolic (congestive) heart failure: Secondary | ICD-10-CM

## 2022-03-05 DIAGNOSIS — I4819 Other persistent atrial fibrillation: Secondary | ICD-10-CM

## 2022-03-05 DIAGNOSIS — R079 Chest pain, unspecified: Secondary | ICD-10-CM | POA: Diagnosis not present

## 2022-03-05 MED ORDER — ENTRESTO 24-26 MG PO TABS: 1.0000 | ORAL_TABLET | Freq: Two times a day (BID) | ORAL | 11 refills | Status: DC

## 2022-03-05 NOTE — Patient Instructions (Signed)
Medication Instructions:   Restart Entresto 24-26 mg two Times Daily   *If you need a refill on your cardiac medications before your next appointment, please call your pharmacy*   Lab Work: Your physician recommends that you return for lab work in: 2 Weeks ( 03/20/22)   If you have labs (blood work) drawn today and your tests are completely normal, you will receive your results only by: [place] (if you have MyChart) OR A paper copy in the mail If you have any lab test that is abnormal or we need to change your treatment, we will call you to review the results.   Testing/Procedures: NONE    Follow-Up: At [facility], you and your health needs are our priority.  As part of our continuing mission to provide you with exceptional heart care, we have created designated Provider Care Teams.  These Care Teams include your primary Cardiologist (physician) and Advanced Practice Providers (APPs -  Physician Assistants and Nurse Practitioners) who all work together to provide you with the care you need, when you need it.  We recommend signing up for the patient portal called "MyChart".  Sign up information is provided on this After Visit Summary.  MyChart is used to connect with patients for Virtual Visits (Telemedicine).  Patients are able to view lab/test results, encounter notes, upcoming appointments, etc.  Non-urgent messages can be sent to your provider as well.   To learn more about what you can do with MyChart, go to [email or web address].    Your next appointment:    As Scheduled   The format for your next appointment:   In Person  Provider:   Carlyle Dolly, MD    Other Instructions You have been given samples today of Entresto 24-26 mg Lot# [id number], Exp: 01/2024   Important Information About Sugar

## 2022-03-05 NOTE — Progress Notes (Signed)
Cardiology Office Note    Date:  03/05/2022   ID:  Erynne, Kealey July 15, 1950, MRN [id number]  PCP:  Fayrene Helper, MD  Cardiologist: Carlyle Dolly, MD    Chief Complaint  Patient presents with   Follow-up    3 month visit    History of Present Illness:    ASEEL TRUXILLO is a 71 y.o. 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  She was last examined by Dr. BHarl Bowiein 11/2021 and reported occasional episodes of sharp chest pain for a few seconds but was overall positional and felt to be atypical for a cardiac etiology. Given her EF was mildly reduced by recent echocardiogram at 45%, Lopressor was transitioned to Toprol-XL 200 mg daily and Benazepril was discontinued with her being started on Entresto 24-26 mg twice daily. It was felt reasonable to continue Cardizem at that time given her difficult to control atrial fibrillation and only mild systolic dysfunction.  In talking with the patient and her husband today, she reports overall feeling well since her last office visit. In reviewing her home medications that she brought with her today, she is not on Entresto and feels like she did not start this secondary to the cost of the medication. She denies any recent chest pain or palpitations. No recent orthopnea, PND or pitting edema. She is listed as taking Lasix 20 mg as needed but says she typically takes this on a daily basis. She remains on Coumadin for anticoagulation and denies any recent melena, hematochezia or hematuria.   Past Medical History:  Diagnosis Date   Annual physical exam 06/07/2015   Aortic atherosclerosis ([facility] [facility]    Arthritis    Baker's cyst of knee 10/04/2013   Diabetes mellitus    Hyperlipidemia    Hypertension    Metabolic syndrome X 068/34/1962   Mild carotid artery disease (HCC)    OA (osteoarthritis) of knee 05/05/2012   Left, takes tramadol on avg twice per week   Obesity    PAF (paroxysmal atrial fibrillation) (HCC)    Persistent atrial fibrillation (HCC)    Right bundle branch block    +Palpitations.   EKG 11/2006:  NSR, RBBB, LAFB, markedly delayed R-wave progression, no change; Echocardiogram in 10/2006-suboptimal quality, normal; Event recorder-PVCs, no significant arrhythmias or symptoms ;   TIA (transient ischemic attack) 06/02/2019   Unspecified visual loss 12/08/2008    Past Surgical History:  Procedure Laterality Date   ABDOMINAL HYSTERECTOMY     BREAST BIOPSY Left    benign   CATARACT EXTRACTION W/PHACO Right 01/08/2016   Procedure: CATARACT EXTRACTION PHACO AND INTRAOCULAR LENS PLACEMENT ([place];  Surgeon: MRutherford Guys MD;  Location: [facility];  Service: Ophthalmology;  Laterality: Right;  CDE: 5.78   CATARACT EXTRACTION W/PHACO Left 02/05/2016   Procedure: CATARACT EXTRACTION PHACO AND INTRAOCULAR LENS PLACEMENT LEFT EYE CDE=6.50;  Surgeon: MRutherford Guys MD;  Location: [facility];  Service: Ophthalmology;  Laterality: Left;   COLONOSCOPY  2006   Dr. STamala Julian Normal, repeat in 2009.   COLONOSCOPY N/A 10/08/2015   Procedure: COLONOSCOPY;  Surgeon: SDanie Binder MD;  Location: [facility];  Service: Endoscopy;  Laterality: N/A;  230 - moved to 1:30, spoke with pt    COLONOSCOPY WITH PROPOFOL N/A 01/28/2021   Procedure: COLONOSCOPY WITH  PROPOFOL;  Surgeon: Eloise Harman, DO;  Location: [facility];  Service: Endoscopy;  Laterality: N/A;  9:45am   POLYPECTOMY  10/08/2015   Procedure: POLYPECTOMY;  Surgeon: Danie Binder, MD;  Location: [facility];  Service: Endoscopy;;  cecal polyp removed via cold forceps with one clip placed/ rectal polyp removed via cold forcep   POLYPECTOMY  01/28/2021   Procedure: POLYPECTOMY;  Surgeon: Eloise Harman, DO;  Location: [facility];  Service: Endoscopy;;   SHOULDER SURGERY   2006   Left-post-trauma   TOTAL ABDOMINAL HYSTERECTOMY W/ BILATERAL SALPINGOOPHORECTOMY      Current Medications: Outpatient Medications Prior to Visit  Medication Sig Dispense Refill   atorvastatin (LIPITOR) 80 MG tablet TAKE 1 TABLET BY MOUTH EVERY DAY (Patient taking differently: Take 80 mg by mouth daily.) 90 tablet 3   cholecalciferol (VITAMIN D3) 25 MCG (1000 UNIT) tablet Take 1,000 Units by mouth daily.     diltiazem (CARDIZEM CD) 120 MG 24 hr capsule TAKE 1 CAPSULE BY MOUTH EVERY DAY 90 capsule 2   furosemide (LASIX) 20 MG tablet TAKE 1 TABLET (20 MG TOTAL) BY MOUTH DAILY AS NEEDED FOR FLUID. 90 tablet 1   metFORMIN (GLUCOPHAGE-XR) 500 MG 24 hr tablet TAKE 1 TABLET BY MOUTH EVERY DAY WITH BREAKFAST 90 tablet 1   metoprolol (TOPROL XL) 200 MG 24 hr tablet Take 1 tablet (200 mg total) by mouth daily. 90 tablet 3   montelukast (SINGULAIR) 10 MG tablet TAKE 1 TABLET BY MOUTH EVERYDAY AT BEDTIME 90 tablet 1   terbinafine (LAMISIL) 250 MG tablet TAKE 1 TABLET BY MOUTH EVERY DAY 42 tablet 1   traMADol (ULTRAM) 50 MG tablet Take 1 tablet (50 mg total) by mouth every 8 (eight) hours as needed for up to 5 days. 15 tablet 0   warfarin (COUMADIN) 5 MG tablet TAKE 1 TABLET DAILY EXCEPT 1/2 TABLET ON SUNDAY AND THURSDAY OR AS DIRECTED BY COUMADIN CLINIC 100 tablet 3   benazepril (LOTENSIN) 20 MG tablet TAKE 1 TABLET BY MOUTH EVERY DAY (Patient not taking: Reported on 03/05/2022) 90 tablet 1   sacubitril-valsartan (ENTRESTO) 24-26 MG Take 1 tablet by mouth 2 (two) times daily. (Patient not taking: Reported on 03/05/2022) 60 tablet 6   No facility-administered medications prior to visit.     Allergies:   Patient has no known allergies.   Social History   Socioeconomic History   Marital status: Married    Spouse name: Not on file   Number of children: 0   Years of education: Not on file   Highest education level: Not on file  Occupational History   Occupation: Museum/gallery exhibitions officer:  UNEMPLOYED  Tobacco Use   Smoking status: Never   Smokeless tobacco: Never  Vaping Use   Vaping Use: Never used  Substance and Sexual Activity   Alcohol use: No   Drug use: No   Sexual activity: Yes    Birth control/protection: Surgical  Other Topics Concern   Not on file  Social History Narrative   Not on file   Social Determinants of Health   Financial Resource Strain: Low Risk  (01/27/2022)   Overall Financial Resource Strain (CARDIA)    Difficulty of Paying Living Expenses: Not hard at all  Food Insecurity: No Food Insecurity (01/27/2022)   Hunger Vital Sign    Worried About Running Out of Food in the Last Year: Never true    [place] in the Last  Year: Never true  Transportation Needs: No Transportation Needs (01/27/2022)   PRAPARE - Hydrologist (Medical): No    Lack of Transportation (Non-Medical): No  Physical Activity: Inactive (01/27/2022)   Exercise Vital Sign    Days of Exercise per Week: 0 days    Minutes of Exercise per Session: 0 min  Stress: No Stress Concern Present (01/21/2021)   [place]    Feeling of Stress : Not at all  Social Connections: Moderately Integrated (01/27/2022)   Social Connection and Isolation Panel [NHANES]    Frequency of Communication with Friends and Family: More than three times a week    Frequency of Social Gatherings with Friends and Family: More than three times a week    Attends Religious Services: More than 4 times per year    Active Member of [employer] or Organizations: No    Attends Music therapist: Never    Marital Status: Married     Family History:  The patient's family history includes Arthritis in her mother; Congestive Heart Failure in her brother; Coronary artery disease in her father; Diabetes in her sister and sister; Heart failure in her brother; Hypertension in her father and mother; Sarcoidosis in her sister;  Stroke in her father.   Review of Systems:    Please see the history of present illness.     All other systems reviewed and are otherwise negative except as noted above.   Physical Exam:    VS:  BP 122/68   Pulse 93   Ht '5\' 2"'$  (1.575 m)   Wt 198 lb (89.8 kg)   SpO2 95%   BMI 36.21 kg/m    General: Well developed, well nourished,female appearing in no acute distress. Head: Normocephalic, atraumatic. Neck: No carotid bruits. JVD not elevated.  Lungs: Respirations regular and unlabored, without wheezes or rales.  Heart: Irregularly irregular. No S3 or S4.  No murmur, no rubs, or gallops appreciated. Abdomen: Appears non-distended. No obvious abdominal masses. Msk:  Strength and tone appear normal for age. No obvious joint deformities or effusions. Extremities: No clubbing or cyanosis. No pitting edema.  Distal pedal pulses are 2+ bilaterally. Neuro: Alert and oriented X 3. Moves all extremities spontaneously. No focal deficits noted. Psych:  Responds to questions appropriately with a normal affect. Skin: No rashes or lesions noted  Wt Readings from Last 3 Encounters:  03/05/22 198 lb (89.8 kg)  02/13/22 194 lb 1.9 oz (88.1 kg)  11/22/21 191 lb 12.8 oz (87 kg)     Studies/Labs Reviewed:   EKG:  EKG is not ordered today.   Recent Labs: 08/13/2021: TSH 1.280 10/15/2021: B Natriuretic Peptide 107.0; Hemoglobin 13.6; Platelets 200 02/13/2022: ALT 24; BUN 19; Creatinine, Ser 0.93; Potassium 4.6; Sodium 140   Lipid Panel    Component Value Date/Time   CHOL 160 02/13/2022 0903   TRIG 101 02/13/2022 0903   HDL 40 02/13/2022 0903   CHOLHDL 4.0 02/13/2022 0903   CHOLHDL 5.3 06/03/2019 0501   VLDL 16 06/03/2019 0501   LDLCALC 101 (H) 02/13/2022 0903   LDLCALC 85 02/25/2018 0847    Additional studies/ records that were reviewed today include:   NST: 04/2021   Findings are consistent with prior myocardial infarction. The study is intermediate risk.   No ST deviation was  noted. The ECG was negative for ischemia.   LV perfusion is abnormal. Defect 1: There is a large defect with  moderate reduction in uptake present in the apical to basal inferior and inferolateral location(s) that is fixed. There is abnormal wall motion in the defect area. Consistent with infarction.   Left ventricular function is normal. Nuclear stress EF: 52 %.   Intermediate risk study.  There is an apical to basal inferior/inferolateral perfusion defect that is fixed and most consistent with infarct scar although there is global hypokinesis in general with LVEF low normal at 52%.  Associated breast attenuation artifact also noted.  No large ischemic territories.  Echocardiogram: 10/2021 IMPRESSIONS     1. Left ventricular ejection fraction, by estimation, is 45 to 50%. The  left ventricle has mildly decreased function. The left ventricle  demonstrates regional wall motion abnormalities (see scoring  diagram/findings for description). The apical anterior,   apical inferior, apical lateral and apex are hypokinetic. Diastolic  function indeteminant due to Afib.   2. Right ventricular systolic function is mildly reduced. The right  ventricular size is normal. There is normal pulmonary artery systolic  pressure.   3. Left atrial size was moderately dilated.   4. Right atrial size was mildly dilated.   5. The mitral valve is grossly normal. Trivial mitral valve  regurgitation.   6. The aortic valve is tricuspid. There is mild calcification of the  aortic valve. There is mild thickening of the aortic valve. Aortic valve  regurgitation is trivial. Aortic valve sclerosis/calcification is present,  without any evidence of aortic  stenosis.   7. The inferior vena cava is normal in size with greater than 50%  respiratory variability, suggesting right atrial pressure of 3 mmHg.   Comparison(s): Compared to prior TTE on 05/2019, the LVEF appears slightly  worse at 45-50% with similar WMA.    Assessment:    1. Chronic systolic heart failure ([place])   2. Medication management   3. Chest pain of uncertain etiology   4. Persistent atrial fibrillation ([place])   5. Essential hypertension   6. Mixed hyperlipidemia      Plan:   In order of problems listed above:  1. HFmrEF - Echocardiogram in 10/2021 showed her EF was mildly reduced at 45 to 50% with wall motion abnormalities as outlined above. She denies any recent orthopnea, PND or pitting edema and appears euvolemic on examination today. She did not start Entresto at the time of her last visit secondary to cost. Samples were provided today along with patient assistance information. Will recheck a BMET in 2 weeks. Continue Toprol-XL '200mg'$  daily and Lasix '20mg'$  PRN.   2. History of Chest Pain - NST in 04/2021 showed a fixed defect consistent with scar but no evidence of ischemia. She denies any recent chest pain at this time.  3. Persistent Atrial Fibrillation - She denies any recent palpitations and heart rate is in the 90's today. Continue Cardizem CD 120 mg daily and Toprol-XL 200 mg daily for rate control.  - She is on Coumadin for anticoagulation which is followed by our [facility]in [facility]. INR was low at 1.5 on 03/03/2022 and dosing was adjusted.   4. HTN - Her BP is at 122/68 during today's visit. Will continue Cardizem CD 120 mg daily along with Toprol-XL 200 mg daily but plan to restart Entresto 24-26 mg twice daily as discussed above.  5. HLD - FLP in 01/2022 showed total cholesterol of 160, triglycerides 101, HDL 40 and LDL 101. Remains on Atorvastatin '80mg'$  daily.    Medication Adjustments/Labs and Tests Ordered: Current medicines are reviewed at length  with the patient today.  Concerns regarding medicines are outlined above.  Medication changes, Labs and Tests ordered today are listed in the Patient Instructions below. Patient Instructions  Medication Instructions:   Restart Entresto 24-26 mg two Times Daily   *If  you need a refill on your cardiac medications before your next appointment, please call your pharmacy*   Lab Work: Your physician recommends that you return for lab work in: 2 Weeks ( 03/20/22)   If you have labs (blood work) drawn today and your tests are completely normal, you will receive your results only by: [place] (if you have MyChart) OR A paper copy in the mail If you have any lab test that is abnormal or we need to change your treatment, we will call you to review the results.   Testing/Procedures: NONE    Follow-Up: At [facility], you and your health needs are our priority.  As part of our continuing mission to provide you with exceptional heart care, we have created designated Provider Care Teams.  These Care Teams include your primary Cardiologist (physician) and Advanced Practice Providers (APPs -  Physician Assistants and Nurse Practitioners) who all work together to provide you with the care you need, when you need it.  We recommend signing up for the patient portal called "MyChart".  Sign up information is provided on this After Visit Summary.  MyChart is used to connect with patients for Virtual Visits (Telemedicine).  Patients are able to view lab/test results, encounter notes, upcoming appointments, etc.  Non-urgent messages can be sent to your provider as well.   To learn more about what you can do with MyChart, go to [email or web address].    Your next appointment:    As Scheduled   The format for your next appointment:   In Person  Provider:   Carlyle Dolly, MD    Other Instructions You have been given samples today of Entresto 24-26 mg Lot# [id number], Exp: 01/2024   Important Information About Sugar         Signed, Erma Heritage, PA-C  03/05/2022 4:19 PM    [facility] [address] [place], [place] [address] Phone: [phone] Fax: [phone]

## 2022-03-07 ENCOUNTER — Telehealth: Payer: Self-pay

## 2022-03-07 NOTE — Telephone Encounter (Signed)
        Patient  visited [place] on 9/18    Telephone encounter attempt :  1st  A HIPAA compliant voice message was left requesting a return call.  Instructed patient to call back    [place] [employer]  [phone] [address], [place], [place] [address] Phone: [phone] Email: [email or web address]

## 2022-03-14 ENCOUNTER — Ambulatory Visit (INDEPENDENT_AMBULATORY_CARE_PROVIDER_SITE_OTHER): Payer: Medicare Other | Admitting: Orthopedic Surgery

## 2022-03-14 ENCOUNTER — Encounter: Payer: Self-pay | Admitting: Orthopedic Surgery

## 2022-03-14 VITALS — BP 127/77 | HR 89 | Ht 62.0 in | Wt 198.0 lb

## 2022-03-14 DIAGNOSIS — M25562 Pain in left knee: Secondary | ICD-10-CM

## 2022-03-14 DIAGNOSIS — G8929 Other chronic pain: Secondary | ICD-10-CM | POA: Diagnosis not present

## 2022-03-14 MED ORDER — TRAMADOL HCL 50 MG PO TABS: 50.0000 mg | ORAL_TABLET | Freq: Four times a day (QID) | ORAL | 0 refills | Status: DC | PRN

## 2022-03-14 NOTE — Patient Instructions (Signed)
Sprays, creams, patches and ice for your knee  If not getting better, consider a steroid injection.    Knee Exercises  Ask your health care provider which exercises are safe for you. Do exercises exactly as told by your health care provider and adjust them as directed. It is normal to feel mild stretching, pulling, tightness, or discomfort as you do these exercises. Stop right away if you feel sudden pain or your pain gets worse. Do not begin these exercises until told by your health care provider.  Stretching and range-of-motion exercises These exercises warm up your muscles and joints and improve the movement and flexibility of your knee. These exercises also help to relieve pain and swelling.  Knee extension, prone Lie on your abdomen (prone position) on a bed. Place your left / right knee just beyond the edge of the surface so your knee is not on the bed. You can put a towel under your left / right thigh just above your kneecap for comfort. Relax your leg muscles and allow gravity to straighten your knee (extension). You should feel a stretch behind your left / right knee. Hold this position for 10 seconds. Scoot up so your knee is supported between repetitions. Repeat 10 times. Complete this exercise 3-4 times per week.     Knee flexion, active Lie on your back with both legs straight. If this causes back discomfort, bend your left / right knee so your foot is flat on the floor. Slowly slide your left / right heel back toward your buttocks. Stop when you feel a gentle stretch in the front of your knee or thigh (flexion). Hold this position for 10 seconds. Slowly slide your left / right heel back to the starting position. Repeat 10 times. Complete this exercise 3-4 times per week.      Quadriceps stretch, prone Lie on your abdomen on a firm surface, such as a bed or padded floor. Bend your left / right knee and hold your ankle. If you cannot reach your ankle or pant leg, loop a belt  around your foot and grab the belt instead. Gently pull your heel toward your buttocks. Your knee should not slide out to the side. You should feel a stretch in the front of your thigh and knee (quadriceps). Hold this position for 10 seconds. Repeat 10 times. Complete this exercise 3-4 times per week.      Hamstring, supine Lie on your back (supine position). Loop a belt or towel over the ball of your left / right foot. The ball of your foot is on the walking surface, right under your toes. Straighten your left / right knee and slowly pull on the belt to raise your leg until you feel a gentle stretch behind your knee (hamstring). Do not let your knee bend while you do this. Keep your other leg flat on the floor. Hold this position for 10 seconds. Repeat 10 times. Complete this exercise 3-4 times per week.   Strengthening exercises These exercises build strength and endurance in your knee. Endurance is the ability to use your muscles for a long time, even after they get tired.  Quadriceps, isometric This exercise stretches the muscles in front of your thigh (quadriceps) without moving your knee joint (isometric). Lie on your back with your left / right leg extended and your other knee bent. Put a rolled towel or small pillow under your knee if told by your health care provider. Slowly tense the muscles in the front  of your left / right thigh. You should see your kneecap slide up toward your hip or see increased dimpling just above the knee. This motion will push the back of the knee toward the floor. For 10 seconds, hold the muscle as tight as you can without increasing your pain. Relax the muscles slowly and completely. Repeat 10 times. Complete this exercise 3-4 times per week. .     Straight leg raises This exercise stretches the muscles in front of your thigh (quadriceps) and the muscles that move your hips (hip flexors). Lie on your back with your left / right leg extended and your  other knee bent. Tense the muscles in the front of your left / right thigh. You should see your kneecap slide up or see increased dimpling just above the knee. Your thigh may even shake a bit. Keep these muscles tight as you raise your leg 4-6 inches (10-15 cm) off the floor. Do not let your knee bend. Hold this position for 10 seconds. Keep these muscles tense as you lower your leg. Relax your muscles slowly and completely after each repetition. Repeat 10 times. Complete this exercise 3-4 times per week.  Hamstring, isometric Lie on your back on a firm surface. Bend your left / right knee about 30 degrees. Dig your left / right heel into the surface as if you are trying to pull it toward your buttocks. Tighten the muscles in the back of your thighs (hamstring) to "dig" as hard as you can without increasing any pain. Hold this position for 10 seconds. Release the tension gradually and allow your muscles to relax completely for __________ seconds after each repetition. Repeat 10 times. Complete this exercise 3-4 times per week.  Hamstring curls If told by your health care provider, do this exercise while wearing ankle weights. Begin with 5 lb weights. Then increase the weight by 1 lb (0.5 kg) increments. You can also use an exercise band Lie on your abdomen with your legs straight. Bend your left / right knee as far as you can without feeling pain. Keep your hips flat against the floor. Hold this position for 10 seconds. Slowly lower your leg to the starting position. Repeat 10 times. Complete this exercise 3-4 times per week.      Squats This exercise strengthens the muscles in front of your thigh and knee (quadriceps). Stand in front of a table, with your feet and knees pointing straight ahead. You may rest your hands on the table for balance but not for support. Slowly bend your knees and lower your hips like you are going to sit in a chair. Keep your weight over your heels, not over  your toes. Keep your lower legs upright so they are parallel with the table legs. Do not let your hips go lower than your knees. Do not bend lower than told by your health care provider. If your knee pain increases, do not bend as low. Hold the squat position for 10 seconds. Slowly push with your legs to return to standing. Do not use your hands to pull yourself to standing. Repeat 10 times. Complete this exercise 3-4 times per week .     Wall slides This exercise strengthens the muscles in front of your thigh and knee (quadriceps). Lean your back against a smooth wall or door, and walk your feet out 18-24 inches (46-61 cm) from it. Place your feet hip-width apart. Slowly slide down the wall or door until your knees bend 90  degrees. Keep your knees over your heels, not over your toes. Keep your knees in line with your hips. Hold this position for 10 seconds. Repeat 10 times. Complete this exercise 3-4 times per week.      Straight leg raises This exercise strengthens the muscles that rotate the leg at the hip and move it away from your body (hip abductors). Lie on your side with your left / right leg in the top position. Lie so your head, shoulder, knee, and hip line up. You may bend your bottom knee to help you keep your balance. Roll your hips slightly forward so your hips are stacked directly over each other and your left / right knee is facing forward. Leading with your heel, lift your top leg 4-6 inches (10-15 cm). You should feel the muscles in your outer hip lifting. Do not let your foot drift forward. Do not let your knee roll toward the ceiling. Hold this position for 10 seconds. Slowly return your leg to the starting position. Let your muscles relax completely after each repetition. Repeat 10 times. Complete this exercise 3-4 times per week.      Straight leg raises This exercise stretches the muscles that move your hips away from the front of the pelvis (hip  extensors). Lie on your abdomen on a firm surface. You can put a pillow under your hips if that is more comfortable. Tense the muscles in your buttocks and lift your left / right leg about 4-6 inches (10-15 cm). Keep your knee straight as you lift your leg. Hold this position for 10 seconds. Slowly lower your leg to the starting position. Let your leg relax completely after each repetition. Repeat 10 times. Complete this exercise 3-4 times per week.

## 2022-03-15 ENCOUNTER — Encounter: Payer: Self-pay | Admitting: Orthopedic Surgery

## 2022-03-15 NOTE — Progress Notes (Signed)
New Patient Visit  Assessment: Samantha Vega is a 71 y.o. female with the following: 1. Chronic pain of left knee  Plan: HONOR FRISON has left knee pain.  Very limited function.  XR with some DJD.  She is not interested in an injection at this time.  She will continue with medications, topicals and braces.  She was also given exercises and a prescription for a cane.   Follow-up: Return if symptoms worsen or fail to improve.  Subjective:  Chief Complaint  Patient presents with   new problem   Knee Pain    LT knee/     History of Present Illness: Samantha Vega is a 71 y.o. female who has been referred by  Ihor Dow, MD for evaluation of left knee pain.  She has had pain in the left knee for a couple of months.  No specific injury.  Pain in the anterior and medial knee.  Occasional swelling.  Some pain in posterior knee.  Pain is worst when standing.  No PT.  No prior injections.    Review of Systems: No fevers or chills No numbness or tingling No chest pain No shortness of breath No bowel or bladder dysfunction No GI distress No headaches   Medical History:  Past Medical History:  Diagnosis Date   Annual physical exam 06/07/2015   Aortic atherosclerosis ([place])    Arthritis    Baker's cyst of knee 10/04/2013   Diabetes mellitus    Hyperlipidemia    Hypertension    Metabolic syndrome X 42/87/6811   Mild carotid artery disease ([facility])    OA (osteoarthritis) of knee 05/05/2012   Left, takes tramadol on avg twice per week   Obesity    PAF (paroxysmal atrial fibrillation) ([facility])    Persistent atrial fibrillation ([facility])    Right bundle branch block    +Palpitations.   EKG 11/2006:  NSR, RBBB, LAFB, markedly delayed R-wave progression, no change; Echocardiogram in 10/2006-suboptimal quality, normal; Event recorder-PVCs, no significant arrhythmias or symptoms ;   TIA (transient ischemic attack) 06/02/2019   Unspecified visual loss 12/08/2008    Past Surgical  History:  Procedure Laterality Date   ABDOMINAL HYSTERECTOMY     BREAST BIOPSY Left    benign   CATARACT EXTRACTION W/PHACO Right 01/08/2016   Procedure: CATARACT EXTRACTION PHACO AND INTRAOCULAR LENS PLACEMENT ([place]);  Surgeon: Rutherford Guys, MD;  Location: [facility];  Service: Ophthalmology;  Laterality: Right;  CDE: 5.78   CATARACT EXTRACTION W/PHACO Left 02/05/2016   Procedure: CATARACT EXTRACTION PHACO AND INTRAOCULAR LENS PLACEMENT LEFT EYE CDE=6.50;  Surgeon: Rutherford Guys, MD;  Location: [facility];  Service: Ophthalmology;  Laterality: Left;   COLONOSCOPY  2006   Dr. Tamala Julian: Normal, repeat in 2009.   COLONOSCOPY N/A 10/08/2015   Procedure: COLONOSCOPY;  Surgeon: Danie Binder, MD;  Location: [facility];  Service: Endoscopy;  Laterality: N/A;  230 - moved to 1:30, spoke with pt    COLONOSCOPY WITH PROPOFOL N/A 01/28/2021   Procedure: COLONOSCOPY WITH PROPOFOL;  Surgeon: Eloise Harman, DO;  Location: [facility];  Service: Endoscopy;  Laterality: N/A;  9:45am   POLYPECTOMY  10/08/2015   Procedure: POLYPECTOMY;  Surgeon: Danie Binder, MD;  Location: [facility];  Service: Endoscopy;;  cecal polyp removed via cold forceps with one clip placed/ rectal polyp removed via cold forcep   POLYPECTOMY  01/28/2021   Procedure: POLYPECTOMY;  Surgeon: Eloise Harman, DO;  Location: [facility];  Service: Endoscopy;;   SHOULDER SURGERY  2006   Left-post-trauma   TOTAL ABDOMINAL HYSTERECTOMY W/ BILATERAL SALPINGOOPHORECTOMY      Family History  Problem Relation Age of Onset   Hypertension Mother    Arthritis Mother    Stroke Father    Hypertension Father    Coronary artery disease Father    Diabetes Sister    Sarcoidosis Sister    Diabetes Sister    Heart failure Brother    Congestive Heart Failure Brother    Colon cancer Neg Hx    Social History   Tobacco Use   Smoking status: Never   Smokeless tobacco: Never  Vaping Use   Vaping Use: Never used  Substance Use Topics    Alcohol use: No   Drug use: No    No Known Allergies  Current Meds  Medication Sig   atorvastatin (LIPITOR) 80 MG tablet TAKE 1 TABLET BY MOUTH EVERY DAY (Patient taking differently: Take 80 mg by mouth daily.)   cholecalciferol (VITAMIN D3) 25 MCG (1000 UNIT) tablet Take 1,000 Units by mouth daily.   diltiazem (CARDIZEM CD) 120 MG 24 hr capsule TAKE 1 CAPSULE BY MOUTH EVERY DAY   furosemide (LASIX) 20 MG tablet TAKE 1 TABLET (20 MG TOTAL) BY MOUTH DAILY AS NEEDED FOR FLUID.   metFORMIN (GLUCOPHAGE-XR) 500 MG 24 hr tablet TAKE 1 TABLET BY MOUTH EVERY DAY WITH BREAKFAST   metoprolol (TOPROL XL) 200 MG 24 hr tablet Take 1 tablet (200 mg total) by mouth daily.   montelukast (SINGULAIR) 10 MG tablet TAKE 1 TABLET BY MOUTH EVERYDAY AT BEDTIME   ofloxacin (OCUFLOX) 0.3 % ophthalmic solution Place 1 drop into both eyes 4 (four) times daily.   sacubitril-valsartan (ENTRESTO) 24-26 MG Take 1 tablet by mouth 2 (two) times daily.   traMADol (ULTRAM) 50 MG tablet Take 1 tablet (50 mg total) by mouth every 6 (six) hours as needed.   warfarin (COUMADIN) 5 MG tablet TAKE 1 TABLET DAILY EXCEPT 1/2 TABLET ON SUNDAY AND THURSDAY OR AS DIRECTED BY COUMADIN CLINIC    Objective: BP 127/77   Pulse 89   Ht '5\' 2"'$  (1.575 m)   Wt 198 lb (89.8 kg)   BMI 36.21 kg/m   Physical Exam:  General: Alert and oriented. and No acute distress. Gait: Left sided antalgic gait.  Left knee mild swelling. Tender over medial compartment.  Good ROM.  Fullness in popliteal fossa. Negative Lachman.  No increased instability to varus or valgus stress.   IMAGING: I personally reviewed images previously obtained in clinic  Mild to moderate left knee degenerative changes.    New Medications:  Meds ordered this encounter  Medications   traMADol (ULTRAM) 50 MG tablet    Sig: Take 1 tablet (50 mg total) by mouth every 6 (six) hours as needed.    Dispense:  20 tablet    Refill:  0      Mordecai Rasmussen,  MD  03/15/2022 1:21 PM

## 2022-03-17 ENCOUNTER — Ambulatory Visit: Payer: Medicare Other | Attending: Cardiology | Admitting: *Deleted

## 2022-03-17 DIAGNOSIS — Z5181 Encounter for therapeutic drug level monitoring: Secondary | ICD-10-CM

## 2022-03-17 DIAGNOSIS — I480 Paroxysmal atrial fibrillation: Secondary | ICD-10-CM

## 2022-03-17 LAB — POCT INR: INR: 2.3 (ref 2.0–3.0)

## 2022-03-17 NOTE — Patient Instructions (Signed)
Continue warfarin 1 tablet daily  Recheck INR in 4 wks.

## 2022-03-31 ENCOUNTER — Other Ambulatory Visit: Payer: Self-pay | Admitting: Cardiology

## 2022-04-07 ENCOUNTER — Other Ambulatory Visit: Payer: Self-pay | Admitting: Orthopedic Surgery

## 2022-04-08 ENCOUNTER — Encounter: Payer: Self-pay | Admitting: Orthopedic Surgery

## 2022-04-08 ENCOUNTER — Ambulatory Visit (INDEPENDENT_AMBULATORY_CARE_PROVIDER_SITE_OTHER): Payer: Medicare Other | Admitting: Orthopedic Surgery

## 2022-04-08 VITALS — Ht 62.0 in | Wt 196.0 lb

## 2022-04-08 DIAGNOSIS — G8929 Other chronic pain: Secondary | ICD-10-CM

## 2022-04-08 DIAGNOSIS — M25562 Pain in left knee: Secondary | ICD-10-CM | POA: Diagnosis not present

## 2022-04-08 MED ORDER — METHYLPREDNISOLONE ACETATE 40 MG/ML IJ SUSP
40.0000 mg | Freq: Once | INTRAMUSCULAR | Status: AC
  Administered 2022-04-08: 40 mg via INTRA_ARTICULAR

## 2022-04-08 NOTE — Progress Notes (Signed)
Return patient Visit  Assessment: CERINA LEARY is a 71 y.o. female with the following: 1. Chronic pain of left knee  Plan: BRIANA FARNER continues to have left knee pain.  Some swelling.  Pain is diffuse.  She refused a steroid injection at the last visit.  Injection was completed today without issues.  She will contact the clinic if she has any issues.  Follow-up as needed.  Procedure note injection Left knee joint   Verbal consent was obtained to inject the left knee joint  Timeout was completed to confirm the site of injection.  The skin was prepped with alcohol and ethyl chloride was sprayed at the injection site.  A 21-gauge needle was used to inject 40 mg of Depo-Medrol and 1% lidocaine (3 cc) into the left knee using an anterolateral approach.  There were no complications. A sterile bandage was applied.     Follow-up: Return if symptoms worsen or fail to improve.  Subjective:  Chief Complaint  Patient presents with   Knee Pain    Left knee painful     History of Present Illness: ROYANNE WARSHAW is a 71 y.o. female who has returned for evaluation of left knee pain.  I saw her in clinic a couple of weeks ago.  We discussed multiple treatment options.  She elected to proceed with some medications.  This has not been helpful.  Her pain continues to be bad.  She is using a cane to assist with ambulation.  She states the pain is throughout the knee, including the posterior aspect of her knee.  She is now interested in an injection.  Review of Systems: No fevers or chills No numbness or tingling No chest pain No shortness of breath No bowel or bladder dysfunction No GI distress No headaches   Objective: Ht '5\' 2"'$  (1.575 m)   Wt 196 lb (88.9 kg)   BMI 35.85 kg/m   Physical Exam:  General: Alert and oriented. and No acute distress. Gait: Left sided antalgic gait.  Left knee mild swelling. Tender over medial compartment.  Good ROM.  Fullness in popliteal  fossa. Negative Lachman.  No increased instability to varus or valgus stress.   IMAGING: I personally reviewed images previously obtained in clinic  Mild to moderate left knee degenerative changes.    New Medications:  Meds ordered this encounter  Medications   methylPREDNISolone acetate (DEPO-MEDROL) injection 40 mg      Mordecai Rasmussen, MD  04/08/2022 1:27 PM

## 2022-04-08 NOTE — Patient Instructions (Addendum)
Instructions Following Joint Injections  In clinic today, you received an injection in one of your joints (sometimes more than one).  Occasionally, you can have some pain at the injection site, this is normal.  You can place ice at the injection site, or take over-the-counter medications such as Tylenol (acetaminophen) or Advil (ibuprofen).  Please follow all directions listed on the bottle.  If your joint (knee or shoulder) becomes swollen, red or very painful, please contact the clinic for additional assistance.   Two medications were injected, including lidocaine and a steroid (often referred to as cortisone).  Lidocaine is effective almost immediately but wears off quickly.  However, the steroid can take a few days to improve your symptoms.  In some cases, it can make your pain worse for a couple of days.  Do not be concerned if this happens as it is common.  You can apply ice or take some over-the-counter medications as needed.      Knee Exercises  Ask your health care provider which exercises are safe for you. Do exercises exactly as told by your health care provider and adjust them as directed. It is normal to feel mild stretching, pulling, tightness, or discomfort as you do these exercises. Stop right away if you feel sudden pain or your pain gets worse. Do not begin these exercises until told by your health care provider.  Stretching and range-of-motion exercises These exercises warm up your muscles and joints and improve the movement and flexibility of your knee. These exercises also help to relieve pain and swelling.  Knee extension, prone Lie on your abdomen (prone position) on a bed. Place your left / right knee just beyond the edge of the surface so your knee is not on the bed. You can put a towel under your left / right thigh just above your kneecap for comfort. Relax your leg muscles and allow gravity to straighten your knee (extension). You should feel a stretch behind your left  / right knee. Hold this position for 10 seconds. Scoot up so your knee is supported between repetitions. Repeat 10 times. Complete this exercise 3-4 times per week.     Knee flexion, active Lie on your back with both legs straight. If this causes back discomfort, bend your left / right knee so your foot is flat on the floor. Slowly slide your left / right heel back toward your buttocks. Stop when you feel a gentle stretch in the front of your knee or thigh (flexion). Hold this position for 10 seconds. Slowly slide your left / right heel back to the starting position. Repeat 10 times. Complete this exercise 3-4 times per week.      Quadriceps stretch, prone Lie on your abdomen on a firm surface, such as a bed or padded floor. Bend your left / right knee and hold your ankle. If you cannot reach your ankle or pant leg, loop a belt around your foot and grab the belt instead. Gently pull your heel toward your buttocks. Your knee should not slide out to the side. You should feel a stretch in the front of your thigh and knee (quadriceps). Hold this position for 10 seconds. Repeat 10 times. Complete this exercise 3-4 times per week.      Hamstring, supine Lie on your back (supine position). Loop a belt or towel over the ball of your left / right foot. The ball of your foot is on the walking surface, right under your toes. Straighten your left /   right knee and slowly pull on the belt to raise your leg until you feel a gentle stretch behind your knee (hamstring). Do not let your knee bend while you do this. Keep your other leg flat on the floor. Hold this position for 10 seconds. Repeat 10 times. Complete this exercise 3-4 times per week.   Strengthening exercises These exercises build strength and endurance in your knee. Endurance is the ability to use your muscles for a long time, even after they get tired.  Quadriceps, isometric This exercise stretches the muscles in front of your thigh  (quadriceps) without moving your knee joint (isometric). Lie on your back with your left / right leg extended and your other knee bent. Put a rolled towel or small pillow under your knee if told by your health care provider. Slowly tense the muscles in the front of your left / right thigh. You should see your kneecap slide up toward your hip or see increased dimpling just above the knee. This motion will push the back of the knee toward the floor. For 10 seconds, hold the muscle as tight as you can without increasing your pain. Relax the muscles slowly and completely. Repeat 10 times. Complete this exercise 3-4 times per week. .     Straight leg raises This exercise stretches the muscles in front of your thigh (quadriceps) and the muscles that move your hips (hip flexors). Lie on your back with your left / right leg extended and your other knee bent. Tense the muscles in the front of your left / right thigh. You should see your kneecap slide up or see increased dimpling just above the knee. Your thigh may even shake a bit. Keep these muscles tight as you raise your leg 4-6 inches (10-15 cm) off the floor. Do not let your knee bend. Hold this position for 10 seconds. Keep these muscles tense as you lower your leg. Relax your muscles slowly and completely after each repetition. Repeat 10 times. Complete this exercise 3-4 times per week.  Hamstring, isometric Lie on your back on a firm surface. Bend your left / right knee about 30 degrees. Dig your left / right heel into the surface as if you are trying to pull it toward your buttocks. Tighten the muscles in the back of your thighs (hamstring) to "dig" as hard as you can without increasing any pain. Hold this position for 10 seconds. Release the tension gradually and allow your muscles to relax completely for __________ seconds after each repetition. Repeat 10 times. Complete this exercise 3-4 times per week.  Hamstring curls If told by your  health care provider, do this exercise while wearing ankle weights. Begin with 5 lb weights. Then increase the weight by 1 lb (0.5 kg) increments. You can also use an exercise band Lie on your abdomen with your legs straight. Bend your left / right knee as far as you can without feeling pain. Keep your hips flat against the floor. Hold this position for 10 seconds. Slowly lower your leg to the starting position. Repeat 10 times. Complete this exercise 3-4 times per week.      Squats This exercise strengthens the muscles in front of your thigh and knee (quadriceps). Stand in front of a table, with your feet and knees pointing straight ahead. You may rest your hands on the table for balance but not for support. Slowly bend your knees and lower your hips like you are going to sit in a chair. Keep   your weight over your heels, not over your toes. Keep your lower legs upright so they are parallel with the table legs. Do not let your hips go lower than your knees. Do not bend lower than told by your health care provider. If your knee pain increases, do not bend as low. Hold the squat position for 10 seconds. Slowly push with your legs to return to standing. Do not use your hands to pull yourself to standing. Repeat 10 times. Complete this exercise 3-4 times per week .     Wall slides This exercise strengthens the muscles in front of your thigh and knee (quadriceps). Lean your back against a smooth wall or door, and walk your feet out 18-24 inches (46-61 cm) from it. Place your feet hip-width apart. Slowly slide down the wall or door until your knees bend 90 degrees. Keep your knees over your heels, not over your toes. Keep your knees in line with your hips. Hold this position for 10 seconds. Repeat 10 times. Complete this exercise 3-4 times per week.      Straight leg raises This exercise strengthens the muscles that rotate the leg at the hip and move it away from your body (hip  abductors). Lie on your side with your left / right leg in the top position. Lie so your head, shoulder, knee, and hip line up. You may bend your bottom knee to help you keep your balance. Roll your hips slightly forward so your hips are stacked directly over each other and your left / right knee is facing forward. Leading with your heel, lift your top leg 4-6 inches (10-15 cm). You should feel the muscles in your outer hip lifting. Do not let your foot drift forward. Do not let your knee roll toward the ceiling. Hold this position for 10 seconds. Slowly return your leg to the starting position. Let your muscles relax completely after each repetition. Repeat 10 times. Complete this exercise 3-4 times per week.      Straight leg raises This exercise stretches the muscles that move your hips away from the front of the pelvis (hip extensors). Lie on your abdomen on a firm surface. You can put a pillow under your hips if that is more comfortable. Tense the muscles in your buttocks and lift your left / right leg about 4-6 inches (10-15 cm). Keep your knee straight as you lift your leg. Hold this position for 10 seconds. Slowly lower your leg to the starting position. Let your leg relax completely after each repetition. Repeat 10 times. Complete this exercise 3-4 times per week.  

## 2022-04-14 ENCOUNTER — Ambulatory Visit: Payer: Medicare Other | Attending: Cardiology | Admitting: *Deleted

## 2022-04-14 DIAGNOSIS — Z5181 Encounter for therapeutic drug level monitoring: Secondary | ICD-10-CM | POA: Diagnosis not present

## 2022-04-14 DIAGNOSIS — I480 Paroxysmal atrial fibrillation: Secondary | ICD-10-CM

## 2022-04-14 LAB — POCT INR: INR: 2.7 (ref 2.0–3.0)

## 2022-04-14 NOTE — Patient Instructions (Signed)
Continue warfarin 1 tablet daily  Recheck INR in 5 wks.

## 2022-04-21 ENCOUNTER — Other Ambulatory Visit: Payer: Self-pay | Admitting: *Deleted

## 2022-04-21 MED ORDER — ENTRESTO 24-26 MG PO TABS: 1.0000 | ORAL_TABLET | Freq: Two times a day (BID) | ORAL | 3 refills | Status: DC

## 2022-04-25 ENCOUNTER — Other Ambulatory Visit: Payer: Self-pay | Admitting: Orthopedic Surgery

## 2022-05-06 ENCOUNTER — Ambulatory Visit: Payer: Medicare Other | Admitting: Cardiology

## 2022-05-07 ENCOUNTER — Ambulatory Visit: Payer: Medicare Other | Attending: Cardiology | Admitting: Cardiology

## 2022-05-07 ENCOUNTER — Encounter: Payer: Self-pay | Admitting: Cardiology

## 2022-05-07 VITALS — BP 134/80 | HR 80 | Ht 62.0 in | Wt 200.0 lb

## 2022-05-07 DIAGNOSIS — D6869 Other thrombophilia: Secondary | ICD-10-CM

## 2022-05-07 DIAGNOSIS — I5022 Chronic systolic (congestive) heart failure: Secondary | ICD-10-CM

## 2022-05-07 DIAGNOSIS — I480 Paroxysmal atrial fibrillation: Secondary | ICD-10-CM | POA: Diagnosis not present

## 2022-05-07 DIAGNOSIS — I10 Essential (primary) hypertension: Secondary | ICD-10-CM

## 2022-05-07 NOTE — Patient Instructions (Signed)
Medication Instructions:  Your physician recommends that you continue on your current medications as directed. Please refer to the Current Medication list given to you today.   Labwork: None  Testing/Procedures: Your physician has requested that you have an echocardiogram. Echocardiography is a painless test that uses sound waves to create images of your heart. It provides your doctor with information about the size and shape of your heart and how well your heart's chambers and valves are working. This procedure takes approximately one hour. There are no restrictions for this procedure. Please do NOT wear cologne, perfume, aftershave, or lotions (deodorant is allowed). Please arrive 15 minutes prior to your appointment time.   Follow-Up: Follow up with Dr. Branch in 6 months.   Any Other Special Instructions Will Be Listed Below (If Applicable).     If you need a refill on your cardiac medications before your next appointment, please call your pharmacy.  

## 2022-05-07 NOTE — Progress Notes (Signed)
Clinical Summary Ms. Huebert is a 71 y.o.female seen today for follow up of the following medical problems     1. Chest pain - history of atypical chest pain - she denies any recent symptoms.    - no chest pains   - ER visit 03/19/2021 with chest pain - trop neg x 2. EKG without acute ischemic changes - CT PE no acute PE, questionable chronic PE   - sharp pain entire chest, mild pain. Can be positional. Lasts just a few minutes. No other associated symptoms. Can occur at rest or with activity. Occurs few times a week - sedentary lifestyle, some DOE with house cleaning.    CAD risk factors: HL, HTN, DM2     05/2019 echo LVEF 50-55%, apical hypokinesis 04/2021 nuclear stress: apical to basal inferior/inferolateral fixed defect suggesting prior infarct. LVEF 52% 10/2021 echo: LVEF 45-50%, apex hypokinetic, indet diastolic fxn, mild RV dysfunction - no recent edema. No DOE - compliant wit meds    -entresto is expensive but reports she has at home and taking.  - no chest pain, no SOB/DOE. Edema is controlled.  -compliant with meds      2. Paroxysmal afib - rate control strategy w/ diltiazem and metoprolol   - previously on xarelto however cost was too much, changed to coumadin.       - no palpitations - no bleeding on coumadin  3. HTN   - she is compliant with meds   4. HL   - she is on atorvastatin   07/2020 TC 153 TG 76 HDL 38 LDL 100 02/2021 TC 145 TG 89 HDL 37 LDL 91  01/2022 TC 160 TG 101 HDL 40 LDL 101   5. LE edema - contorolled with lasix Past Medical History:  Diagnosis Date   Annual physical exam 06/07/2015   Aortic atherosclerosis ([place])    Arthritis    Baker's cyst of knee 10/04/2013   Diabetes mellitus    Hyperlipidemia    Hypertension    Metabolic syndrome X 25/85/2778   Mild carotid artery disease (HCC)    OA (osteoarthritis) of knee 05/05/2012   Left, takes tramadol on avg twice per week   Obesity    PAF (paroxysmal atrial  fibrillation) (HCC)    Persistent atrial fibrillation (HCC)    Right bundle Rajah Lamba block    +Palpitations.   EKG 11/2006:  NSR, RBBB, LAFB, markedly delayed R-wave progression, no change; Echocardiogram in 10/2006-suboptimal quality, normal; Event recorder-PVCs, no significant arrhythmias or symptoms ;   TIA (transient ischemic attack) 06/02/2019   Unspecified visual loss 12/08/2008     No Known Allergies   Current Outpatient Medications  Medication Sig Dispense Refill   atorvastatin (LIPITOR) 80 MG tablet TAKE 1 TABLET BY MOUTH EVERY DAY (Patient taking differently: Take 80 mg by mouth daily.) 90 tablet 3   cholecalciferol (VITAMIN D3) 25 MCG (1000 UNIT) tablet Take 1,000 Units by mouth daily.     diltiazem (CARDIZEM CD) 120 MG 24 hr capsule TAKE 1 CAPSULE BY MOUTH EVERY DAY 90 capsule 2   furosemide (LASIX) 20 MG tablet TAKE 1 TABLET (20 MG TOTAL) BY MOUTH DAILY AS NEEDED FOR FLUID. 90 tablet 1   metFORMIN (GLUCOPHAGE-XR) 500 MG 24 hr tablet TAKE 1 TABLET BY MOUTH EVERY DAY WITH BREAKFAST 90 tablet 1   metoprolol (TOPROL XL) 200 MG 24 hr tablet Take 1 tablet (200 mg total) by mouth daily. 90 tablet 3   montelukast (SINGULAIR) 10  MG tablet TAKE 1 TABLET BY MOUTH EVERYDAY AT BEDTIME 90 tablet 1   ofloxacin (OCUFLOX) 0.3 % ophthalmic solution Place 1 drop into both eyes 4 (four) times daily.     sacubitril-valsartan (ENTRESTO) 24-26 MG Take 1 tablet by mouth 2 (two) times daily. 180 tablet 3   terbinafine (LAMISIL) 250 MG tablet TAKE 1 TABLET BY MOUTH EVERY DAY 42 tablet 1   traMADol (ULTRAM) 50 MG tablet TAKE 1 TABLET BY MOUTH EVERY 6 HOURS AS NEEDED 20 tablet 0   warfarin (COUMADIN) 5 MG tablet TAKE 1 TABLET DAILY EXCEPT 1/2 TABLET ON SUNDAY AND THURSDAY OR AS DIRECTED BY COUMADIN CLINIC 100 tablet 3   No current facility-administered medications for this visit.     Past Surgical History:  Procedure Laterality Date   ABDOMINAL HYSTERECTOMY     BREAST BIOPSY Left    benign    CATARACT EXTRACTION W/PHACO Right 01/08/2016   Procedure: CATARACT EXTRACTION PHACO AND INTRAOCULAR LENS PLACEMENT (IOC);  Surgeon: Rutherford Guys, MD;  Location: [facility];  Service: Ophthalmology;  Laterality: Right;  CDE: 5.78   CATARACT EXTRACTION W/PHACO Left 02/05/2016   Procedure: CATARACT EXTRACTION PHACO AND INTRAOCULAR LENS PLACEMENT LEFT EYE CDE=6.50;  Surgeon: Rutherford Guys, MD;  Location: [facility];  Service: Ophthalmology;  Laterality: Left;   COLONOSCOPY  2006   Dr. Tamala Julian: Normal, repeat in 2009.   COLONOSCOPY N/A 10/08/2015   Procedure: COLONOSCOPY;  Surgeon: Danie Binder, MD;  Location: [facility];  Service: Endoscopy;  Laterality: N/A;  230 - moved to 1:30, spoke with pt    COLONOSCOPY WITH PROPOFOL N/A 01/28/2021   Procedure: COLONOSCOPY WITH PROPOFOL;  Surgeon: Eloise Harman, DO;  Location: [facility];  Service: Endoscopy;  Laterality: N/A;  9:45am   POLYPECTOMY  10/08/2015   Procedure: POLYPECTOMY;  Surgeon: Danie Binder, MD;  Location: [facility];  Service: Endoscopy;;  cecal polyp removed via cold forceps with one clip placed/ rectal polyp removed via cold forcep   POLYPECTOMY  01/28/2021   Procedure: POLYPECTOMY;  Surgeon: Eloise Harman, DO;  Location: [facility];  Service: Endoscopy;;   SHOULDER SURGERY  2006   Left-post-trauma   TOTAL ABDOMINAL HYSTERECTOMY W/ BILATERAL SALPINGOOPHORECTOMY       No Known Allergies    Family History  Problem Relation Age of Onset   Hypertension Mother    Arthritis Mother    Stroke Father    Hypertension Father    Coronary artery disease Father    Diabetes Sister    Sarcoidosis Sister    Diabetes Sister    Heart failure Brother    Congestive Heart Failure Brother    Colon cancer Neg Hx      Social History Ms. Minogue reports that she has never smoked. She has never used smokeless tobacco. Ms. Skipper reports no history of alcohol use.   Review of Systems CONSTITUTIONAL: No weight loss, fever, chills,  weakness or fatigue.  HEENT: Eyes: No visual loss, blurred vision, double vision or yellow sclerae.No hearing loss, sneezing, congestion, runny nose or sore throat.  SKIN: No rash or itching.  CARDIOVASCULAR: per hpi RESPIRATORY: No shortness of breath, cough or sputum.  GASTROINTESTINAL: No anorexia, nausea, vomiting or diarrhea. No abdominal pain or blood.  GENITOURINARY: No burning on urination, no polyuria NEUROLOGICAL: No headache, dizziness, syncope, paralysis, ataxia, numbness or tingling in the extremities. No change in bowel or bladder control.  MUSCULOSKELETAL: No muscle, back pain, joint pain or stiffness.  LYMPHATICS: No enlarged nodes. No history of splenectomy.  PSYCHIATRIC: No history of depression or anxiety.  ENDOCRINOLOGIC: No reports of sweating, cold or heat intolerance. No polyuria or polydipsia.  Marland Kitchen   Physical Examination Today's Vitals   05/07/22 0939  BP: 134/80  Pulse: 80  Weight: 200 lb (90.7 kg)  Height: '5\' 2"'$  (1.575 m)   Body mass index is 36.58 kg/m.  Gen: resting comfortably, no acute distress HEENT: no scleral icterus, pupils equal round and reactive, no palptable cervical adenopathy,  CV: RRR, no m/r/g no jvd Resp: Clear to auscultation bilaterally GI: abdomen is soft, non-tender, non-distended, normal bowel sounds, no hepatosplenomegaly MSK: extremities are warm, no edema.  Skin: warm, no rash Neuro:  no focal deficits Psych: appropriate affect   Diagnostic Studies  Echo: LVEF 55-60%, mild basal septal hypertrophy,     07/11/12 Carotid US: <50% bilateral stenosis, 3.4 cm thyroid nodule     05/2019 echo IMPRESSIONS     1. Left ventricular ejection fraction, by visual estimation, is 50 to  55%. The left ventricle has low normal function. There is borderline left  ventricular hypertrophy.   2. Apical lateral segment, apical anterior segment, apical inferior  segment, and apex are abnormal.   3. Left ventricular diastolic parameters are  indeterminate in the setting  of atrial fibrillation.   4. The left ventricle demonstrates regional wall motion abnormalities.   5. Global right ventricle has low normal systolic function.The right  ventricular size is normal. No increase in right ventricular wall  thickness.   6. Left atrial size was normal.   7. Right atrial size was normal.   8. Mild mitral annular calcification.   9. The mitral valve is grossly normal. Trivial mitral valve  regurgitation.  10. The tricuspid valve is grossly normal. Tricuspid valve regurgitation  is trivial.  11. The aortic valve is tricuspid. Aortic valve regurgitation is not  visualized.  12. The pulmonic valve was grossly normal. Pulmonic valve regurgitation is  trivial.  13. TR signal is inadequate for assessing pulmonary artery systolic  pressure.  14. The inferior vena cava is normal in size with greater than 50%  respiratory variability, suggesting right atrial pressure of 3 mmHg.  15. No obvious right to left interatrial shunting by saline contrast.      04/2021 nuclear stress   Findings are consistent with prior myocardial infarction. The study is intermediate risk.   No ST deviation was noted. The ECG was negative for ischemia.   LV perfusion is abnormal. Defect 1: There is a large defect with moderate reduction in uptake present in the apical to basal inferior and inferolateral location(s) that is fixed. There is abnormal wall motion in the defect area. Consistent with infarction.   Left ventricular function is normal. Nuclear stress EF: 52 %.   Intermediate risk study.  There is an apical to basal inferior/inferolateral perfusion defect that is fixed and most consistent with infarct scar although there is global hypokinesis in general with LVEF low normal at 52%.  Associated breast attenuation artifact also noted.  No large ischemic territories.   10/2021 echo 1. Left ventricular ejection fraction, by estimation, is 45 to 50%. The   left ventricle has mildly decreased function. The left ventricle  demonstrates regional wall motion abnormalities (see scoring  diagram/findings for description). The apical anterior,   apical inferior, apical lateral and apex are hypokinetic. Diastolic  function indeteminant due to Afib.   2. Right ventricular systolic function is mildly reduced.  The right  ventricular size is normal. There is normal pulmonary artery systolic  pressure.   3. Left atrial size was moderately dilated.   4. Right atrial size was mildly dilated.   5. The mitral valve is grossly normal. Trivial mitral valve  regurgitation.   6. The aortic valve is tricuspid. There is mild calcification of the  aortic valve. There is mild thickening of the aortic valve. Aortic valve  regurgitation is trivial. Aortic valve sclerosis/calcification is present,  without any evidence of aortic  stenosis.   7. The inferior vena cava is normal in size with greater than 50%  respiratory variability, suggesting right atrial pressure of 3 mmHg.     Assessment and Plan  1. Chronic systolic HF - mild LV dysfunction by recent echo, agree with echo LVEF 45% - we will repeat limited echo, if ongong dysfunction further medication adjustements - no symptoms, continue current meds  2. Afib/acquried thrombophilia - no symptoms, continue current meds. - very mild LV dysfunction, given difficult to rate control we have continued diltiazem for now    3. HTN -at goal, continue current meds  4. Hyperlipidemia - at goal, continue current meds     Arnoldo Lenis, M.D.

## 2022-05-19 ENCOUNTER — Ambulatory Visit: Payer: Medicare Other | Attending: Cardiology | Admitting: *Deleted

## 2022-05-19 DIAGNOSIS — I480 Paroxysmal atrial fibrillation: Secondary | ICD-10-CM

## 2022-05-19 DIAGNOSIS — Z5181 Encounter for therapeutic drug level monitoring: Secondary | ICD-10-CM

## 2022-05-19 LAB — POCT INR: INR: 2.8 (ref 2.0–3.0)

## 2022-05-19 NOTE — Patient Instructions (Signed)
Continue warfarin 1 tablet daily  Recheck INR in 6 wks.

## 2022-05-20 ENCOUNTER — Telehealth: Payer: Self-pay | Admitting: Family Medicine

## 2022-05-20 ENCOUNTER — Other Ambulatory Visit: Payer: Self-pay | Admitting: Orthopedic Surgery

## 2022-05-20 ENCOUNTER — Other Ambulatory Visit: Payer: Self-pay | Admitting: Family Medicine

## 2022-05-20 NOTE — Progress Notes (Signed)
[facility] Quality Team Note  Name: [name] Date of Birth: 1950-08-29 MRN: [id number] Date: 05/20/2022  [facility] Quality Team has reviewed this patient's chart, please see recommendations below:  [facility] Quality Other; ([place]. PATIENT NEEDS URINE MICROALBUMIN/CREATININE RATIO COMPLETED BEFORE END OF YEAR FOR GAP CLOSURE.)

## 2022-05-20 NOTE — Telephone Encounter (Signed)
Dr Amedeo Kinsman filled Tramadol yesterday ortho

## 2022-05-20 NOTE — Telephone Encounter (Signed)
Prescription Request  05/20/2022  Is this a "Controlled Substance" medicine? No  LOV: 02/13/2022  What is the name of the medication or equipment? terbinafine (LAMISIL) 250 MG tablet [[id number]]   traMADol (ULTRAM) 50 MG tablet [[id number]]   Have you contacted your pharmacy to request a refill? No   Which pharmacy would you like this sent to?  [id number] [place] [address] [address]Phone: [phone]: [phone]   Patient notified that their request is being sent to the clinical staff for review and that they should receive a response within 2 business days.   Please advise at Mobile [phone](mobile)

## 2022-05-23 ENCOUNTER — Ambulatory Visit (HOSPITAL_COMMUNITY)
Admission: RE | Admit: 2022-05-23 | Discharge: 2022-05-23 | Disposition: A | Discharge status: Home / Self Care | Payer: Medicare Other | Source: Ambulatory Visit | Attending: Cardiology | Admitting: Cardiology

## 2022-05-23 DIAGNOSIS — I5022 Chronic systolic (congestive) heart failure: Secondary | ICD-10-CM

## 2022-05-23 LAB — ECHOCARDIOGRAM LIMITED
Calc EF: 55.8 %
S' Lateral: 3.2 cm
Single Plane A2C EF: 57.9 %
Single Plane A4C EF: 55.5 %

## 2022-05-23 NOTE — Progress Notes (Signed)
*  PRELIMINARY RESULTS* Echocardiogram Limited 2-D Echocardiogram  has been performed.  Samantha Vega 05/23/2022, 11:09 AM

## 2022-06-02 ENCOUNTER — Telehealth: Payer: Self-pay | Admitting: Family Medicine

## 2022-06-02 ENCOUNTER — Other Ambulatory Visit: Payer: Self-pay | Admitting: Cardiology

## 2022-06-02 ENCOUNTER — Other Ambulatory Visit: Payer: Self-pay

## 2022-06-02 MED ORDER — METOPROLOL SUCCINATE ER 200 MG PO TB24: 200.0000 mg | ORAL_TABLET | Freq: Every day | ORAL | 3 refills | Status: DC

## 2022-06-02 NOTE — Telephone Encounter (Signed)
Refills sent

## 2022-06-02 NOTE — Telephone Encounter (Signed)
Med refill  metoprolol (TOPROL XL) 200 MG 24 hr tablet [[id number]]   Ph[facility]acy  [id number] [place] [address] [place]Phone: [phone] Fax: [phone] #: [id number]

## 2022-06-03 ENCOUNTER — Other Ambulatory Visit: Payer: Self-pay

## 2022-06-03 ENCOUNTER — Other Ambulatory Visit: Payer: Self-pay | Admitting: Orthopedic Surgery

## 2022-06-03 MED ORDER — METOPROLOL SUCCINATE ER 200 MG PO TB24: 200.0000 mg | ORAL_TABLET | Freq: Every day | ORAL | 3 refills | Status: DC

## 2022-06-03 NOTE — Telephone Encounter (Signed)
The pharmacy confirmed receipt. [facility]

## 2022-06-03 NOTE — Telephone Encounter (Signed)
Came by the office pharmacy not received this medicine yet.   [facility] [place]

## 2022-06-04 ENCOUNTER — Encounter: Payer: Self-pay | Admitting: Family Medicine

## 2022-06-04 ENCOUNTER — Ambulatory Visit (INDEPENDENT_AMBULATORY_CARE_PROVIDER_SITE_OTHER): Payer: Medicare Other | Admitting: Family Medicine

## 2022-06-04 DIAGNOSIS — J3089 Other allergic rhinitis: Secondary | ICD-10-CM

## 2022-06-04 MED ORDER — MONTELUKAST SODIUM 10 MG PO TABS: 10.0000 mg | ORAL_TABLET | Freq: Every day | ORAL | 1 refills | Status: DC

## 2022-06-04 NOTE — Progress Notes (Unsigned)
Virtual Visit via Telephone Note  I connected with [name] on 06/04/22 at  4:40 PM EST by telephone and verified that I am speaking with the correct person using two identifiers.  Location: Patient: home Provider: office   I discussed the limitations, risks, security and privacy concerns of performing an evaluation and management service by telephone and the availability of in person appointments. I also discussed with the patient that there may be a patient responsible charge related to this service. The patient expressed understanding and agreed to proceed.   History of Present Illness: 6 day h/o excessive cough and sneezing, no fever or chlls, drainage is clear, out of her allergy medication and this is a bad time of the year for her with the cold No concerns otherwise   Observations/Objective: Good communication with no confusionNo signs of respiratory distress during speech   Assessment and Plan: Allergic rhinitis Uncontrolled will resume montelukast, script sent   Follow Up Instructions:    I discussed the assessment and treatment plan with the patient. The patient was provided an opportunity to ask questions and all were answered. The patient agreed with the plan and demonstrated an understanding of the instructions.   The patient was advised to call back or seek an in-person evaluation if the symptoms worsen or if the condition fails to improve as anticipated.  I provided 7 minutes of non-face-to-face time during this encounter.   Tula Nakayama, MD

## 2022-06-05 ENCOUNTER — Encounter: Payer: Self-pay | Admitting: Family Medicine

## 2022-06-05 NOTE — Assessment & Plan Note (Signed)
Uncontrolled will resume montelukast, script sent

## 2022-06-05 NOTE — Patient Instructions (Signed)
F/Us before , call if you need me sooner  You are experiencing uncontrolled allergy symptoms and you need to resume daily montelukast, I have sent in the script  Thanks for choosing [facility], we consider it a privelige to serve you.

## 2022-06-30 ENCOUNTER — Ambulatory Visit: Payer: Medicare Other | Attending: Cardiology | Admitting: *Deleted

## 2022-06-30 DIAGNOSIS — I480 Paroxysmal atrial fibrillation: Secondary | ICD-10-CM

## 2022-06-30 DIAGNOSIS — Z5181 Encounter for therapeutic drug level monitoring: Secondary | ICD-10-CM | POA: Diagnosis not present

## 2022-06-30 LAB — POCT INR: INR: 3.4 — AB (ref 2.0–3.0)

## 2022-06-30 NOTE — Patient Instructions (Signed)
Hold warfarin tonight then resume 1 tablet daily  Recheck INR in 4 wks.

## 2022-07-04 ENCOUNTER — Encounter: Payer: Self-pay | Admitting: Podiatry

## 2022-07-04 ENCOUNTER — Ambulatory Visit (INDEPENDENT_AMBULATORY_CARE_PROVIDER_SITE_OTHER): Payer: Medicare Other | Admitting: Podiatry

## 2022-07-04 VITALS — BP 125/73

## 2022-07-04 DIAGNOSIS — E119 Type 2 diabetes mellitus without complications: Secondary | ICD-10-CM | POA: Diagnosis not present

## 2022-07-04 DIAGNOSIS — L84 Corns and callosities: Secondary | ICD-10-CM | POA: Diagnosis not present

## 2022-07-04 DIAGNOSIS — M79675 Pain in left toe(s): Secondary | ICD-10-CM | POA: Diagnosis not present

## 2022-07-04 DIAGNOSIS — B351 Tinea unguium: Secondary | ICD-10-CM | POA: Diagnosis not present

## 2022-07-04 DIAGNOSIS — M79674 Pain in right toe(s): Secondary | ICD-10-CM | POA: Diagnosis not present

## 2022-07-04 NOTE — Progress Notes (Signed)
  Subjective:  Patient ID: Samantha Vega, female    DOB: 12-Oct-1950,  MRN: [id number]  Samantha Vega presents to clinic today for preventative diabetic foot care and callus(es) b/l lower extremities and painful thick toenails that are difficult to trim. Painful toenails interfere with ambulation. Aggravating factors include wearing enclosed shoe gear. Pain is relieved with periodic professional debridement. Painful calluses are aggravated when weightbearing with and without shoegear. Pain is relieved with periodic professional debridement.  Chief Complaint  Patient presents with   Nail Problem    DFC BS-do not check A1C-Do not know PCP-Simpson, Margaret PCP [phone]   New problem(s): None.   PCP is Fayrene Helper, MD.  No Known Allergies  Review of Systems: Negative except as noted in the HPI.  Objective:  Vitals:   07/04/22 0816  BP: 125/73   Samantha Vega is a pleasant 72 y.o. female in NAD. AAO x 3. Vascular Examination: CFT <3 seconds b/l LE. Palpable DP pulse(s) b/l LE. Faintly palpable PT pulse(s) b/l LE. Trace edema noted BLE. No ischemia or gangrene noted b/l LE. No cyanosis or clubbing noted b/l LE.  Dermatological Examination: Pedal integument with normal turgor, texture and tone b/l LE. No open wounds b/l. No interdigital macerations b/l. Toenails 1-5 b/l elongated, thickened, discolored with subungual debris. +Tenderness with dorsal palpation of nailplates.   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 No erythema, no edema, no drainage, no fluctuance.   Neurological Examination: Protective sensation intact 5/5 intact bilaterally with 10g monofilament b/l. Vibratory sensation intact b/l.  Musculoskeletal Examination: Normal muscle strength 5/5 to all lower extremity muscle groups bilaterally. 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 No pain, crepitus or joint limitation noted with ROM b/l LE.  Patient ambulates independently without  assistive aids.  Assessment/Plan: 1. Pain due to onychomycosis of toenails of both feet   2. Callus   3. Diabetes mellitus type 2, diet-controlled ([place])     -Consent given for treatment as described below: -Examined patient. -Continue foot and shoe inspections daily. Monitor blood glucose per PCP/Endocrinologist's recommendations. -Continue supportive shoe gear daily. 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 -Callus(es) L hallux and R hallux pared utilizing sterile scalpel blade without complication or incident. Total number debrided =2. -Patient/POA to call should there be question/concern in the interim.   Return in about 3 months (around 10/03/2022).  Marzetta Board, DPM

## 2022-07-15 ENCOUNTER — Other Ambulatory Visit: Payer: Self-pay | Admitting: Orthopedic Surgery

## 2022-07-15 ENCOUNTER — Other Ambulatory Visit: Payer: Self-pay | Admitting: Cardiology

## 2022-07-21 ENCOUNTER — Emergency Department (HOSPITAL_COMMUNITY)
Admission: EM | Admit: 2022-07-21 | Discharge: 2022-07-21 | Disposition: A | Discharge status: Home / Self Care | Payer: Medicare Other | Attending: Emergency Medicine | Admitting: Emergency Medicine

## 2022-07-21 ENCOUNTER — Emergency Department (HOSPITAL_COMMUNITY): Payer: Medicare Other

## 2022-07-21 DIAGNOSIS — R079 Chest pain, unspecified: Secondary | ICD-10-CM | POA: Diagnosis not present

## 2022-07-21 DIAGNOSIS — Z20822 Contact with and (suspected) exposure to covid-19: Secondary | ICD-10-CM | POA: Insufficient documentation

## 2022-07-21 DIAGNOSIS — I480 Paroxysmal atrial fibrillation: Secondary | ICD-10-CM | POA: Insufficient documentation

## 2022-07-21 DIAGNOSIS — Z7901 Long term (current) use of anticoagulants: Secondary | ICD-10-CM | POA: Insufficient documentation

## 2022-07-21 LAB — CBC
HCT: 38.6 % (ref 36.0–46.0)
Hemoglobin: 12.7 g/dL (ref 12.0–15.0)
MCH: 29.1 pg (ref 26.0–34.0)
MCHC: 32.9 g/dL (ref 30.0–36.0)
MCV: 88.3 fL (ref 80.0–100.0)
Platelets: 187 10*3/uL (ref 150–400)
RBC: 4.37 MIL/uL (ref 3.87–5.11)
RDW: 14.6 % (ref 11.5–15.5)
WBC: 8.9 10*3/uL (ref 4.0–10.5)
nRBC: 0 % (ref 0.0–0.2)

## 2022-07-21 LAB — BASIC METABOLIC PANEL
Anion gap: 8 (ref 5–15)
BUN: 10 mg/dL (ref 8–23)
CO2: 26 mmol/L (ref 22–32)
Calcium: 8.8 mg/dL — ABNORMAL LOW (ref 8.9–10.3)
Chloride: 102 mmol/L (ref 98–111)
Creatinine, Ser: 0.84 mg/dL (ref 0.44–1.00)
GFR, Estimated: >60 mL/min (ref >60)
Glucose, Bld: 98 mg/dL (ref 70–99)
Potassium: 3.8 mmol/L (ref 3.5–5.1)
Sodium: 136 mmol/L (ref 135–145)

## 2022-07-21 LAB — RESP PANEL BY RT-PCR (RSV, FLU A&B, COVID)  RVPGX2
Influenza A by PCR: NEGATIVE
Influenza B by PCR: NEGATIVE
Resp Syncytial Virus by PCR: NEGATIVE
SARS Coronavirus 2 by RT PCR: NEGATIVE

## 2022-07-21 LAB — PROTIME-INR
INR: 2 — ABNORMAL HIGH (ref 0.8–1.2)
Prothrombin Time: 22.6 seconds — ABNORMAL HIGH (ref 11.4–15.2)

## 2022-07-21 LAB — D-DIMER, QUANTITATIVE: D-Dimer, Quant: 0.27 ug/mL-FEU (ref 0.00–0.50)

## 2022-07-21 LAB — TROPONIN I (HIGH SENSITIVITY)
Troponin I (High Sensitivity): 16 ng/L (ref <18)
Troponin I (High Sensitivity): 12 ng/L (ref <18)

## 2022-07-21 MED ORDER — ACETAMINOPHEN 500 MG PO TABS
1000.0000 mg | ORAL_TABLET | Freq: Once | ORAL | Status: AC
  Administered 2022-07-21: 1000 mg via ORAL
  Filled 2022-07-21: qty 2

## 2022-07-21 MED ORDER — METOPROLOL TARTRATE 5 MG/5ML IV SOLN
5.0000 mg | Freq: Once | INTRAVENOUS | Status: AC
  Administered 2022-07-21: 5 mg via INTRAVENOUS
  Filled 2022-07-21: qty 5

## 2022-07-21 NOTE — ED Triage Notes (Signed)
Pt in with central cp for a few days, worsened today. +sob, weakness noted as well. Afib 110-140's in triage. Pt states she has not taken her meds today. Temp 100.2

## 2022-07-21 NOTE — ED Provider Notes (Signed)
[facility] Provider Note   CSN: [id number] Arrival date & time: 07/21/22  1328     History  Chief Complaint  Patient presents with   Chest Pain    Samantha Vega is a 72 y.o. female.  Pt is a 72 yo female with pmhx significant for HLD, pafib, tia, oa, tia, metabolic x syndrome, and aortic atherosclerosis.  Pt is a poor historian, but said she has had cp for a few days.  She feels weak.  She did not take any of her meds today.  She did have a low grade fever in triage, but was unaware she had a fever.  No cough.  She does feels sob.       Home Medications Prior to Admission medications   Medication Sig Start Date End Date Taking? Authorizing Provider  atorvastatin (LIPITOR) 80 MG tablet TAKE 1 TABLET BY MOUTH EVERY DAY Patient taking differently: Take 80 mg by mouth daily. 07/22/21   Fayrene Helper, MD  cholecalciferol (VITAMIN D3) 25 MCG (1000 UNIT) tablet Take 1,000 Units by mouth daily.    [provider]  diltiazem (CARDIZEM CD) 120 MG 24 hr capsule TAKE 1 CAPSULE BY MOUTH EVERY DAY 09/18/21   Fayrene Helper, MD  furosemide (LASIX) 20 MG tablet TAKE 1 TABLET (20 MG TOTAL) BY MOUTH DAILY AS NEEDED FOR FLUID 07/15/22   Arnoldo Lenis, MD  metFORMIN (GLUCOPHAGE-XR) 500 MG 24 hr tablet TAKE 1 TABLET BY MOUTH EVERY DAY WITH BREAKFAST 02/12/22   Fayrene Helper, MD  metoprolol (TOPROL XL) 200 MG 24 hr tablet Take 1 tablet (200 mg total) by mouth daily. 06/03/22   Fayrene Helper, MD  montelukast (SINGULAIR) 10 MG tablet Take 1 tablet (10 mg total) by mouth at bedtime. 06/04/22   Fayrene Helper, MD  ofloxacin (OCUFLOX) 0.3 % ophthalmic solution Place 1 drop into both eyes 4 (four) times daily.    [provider]  sacubitril-valsartan (ENTRESTO) 24-26 MG Take 1 tablet by mouth 2 (two) times daily. 04/21/22   Arnoldo Lenis, MD  terbinafine (LAMISIL) 250 MG tablet TAKE 1 TABLET BY MOUTH EVERY DAY  05/20/22   Fayrene Helper, MD  traMADol (ULTRAM) 50 MG tablet TAKE 1 TABLET BY MOUTH EVERY 6 HOURS AS NEEDED 07/15/22   Mordecai Rasmussen, MD  warfarin (COUMADIN) 5 MG tablet TAKE 1 TABLET DAILY EXCEPT 1/2 TABLET ON SUNDAY AND THURSDAY OR AS DIRECTED BY COUMADIN CLINIC 10/08/21   Fayrene Helper, MD      Allergies    Patient has no known allergies.    Review of Systems   Review of Systems  Respiratory:  Positive for shortness of breath.   Cardiovascular:  Positive for chest pain and palpitations.  All other systems reviewed and are negative.   Physical Exam Updated Vital Signs BP (!) 145/94 (BP Location: Left Arm)   Pulse 92   Temp 98.5 F (36.9 C) (Oral)   Resp 18   Ht '5\' 2"'$  (1.575 m)   SpO2 97%   BMI 36.58 kg/m  Physical Exam Vitals and nursing note reviewed.  Constitutional:      Appearance: She is well-developed. She is obese.  HENT:     Head: Normocephalic and atraumatic.  Eyes:     Extraocular Movements: Extraocular movements intact.     Pupils: Pupils are equal, round, and reactive to light.  Cardiovascular:     Rate and Rhythm: Tachycardia  present. Rhythm irregular.     Heart sounds: Normal heart sounds.  Pulmonary:     Effort: Pulmonary effort is normal.     Breath sounds: Normal breath sounds.  Abdominal:     General: Bowel sounds are normal.     Palpations: Abdomen is soft.  Musculoskeletal:        General: Normal range of motion.     Cervical back: Normal range of motion and neck supple.  Skin:    General: Skin is warm.     Capillary Refill: Capillary refill takes less than 2 seconds.  Neurological:     General: No focal deficit present.     Mental Status: She is alert and oriented to person, place, and time.  Psychiatric:        Mood and Affect: Mood normal.        Behavior: Behavior normal.     ED Results / Procedures / Treatments   Labs (all labs ordered are listed, but only abnormal results are displayed) Labs Reviewed  BASIC METABOLIC  PANEL - Abnormal; Notable for the following components:      Result Value   Calcium 8.8 (*)    All other components within normal limits  PROTIME-INR - Abnormal; Notable for the following components:   Prothrombin Time 22.6 (*)    INR 2.0 (*)    All other components within normal limits  RESP PANEL BY RT-PCR (RSV, FLU A&B, COVID)  RVPGX2  CBC  D-DIMER, QUANTITATIVE  URINALYSIS, ROUTINE W REFLEX MICROSCOPIC  TROPONIN I (HIGH SENSITIVITY)  TROPONIN I (HIGH SENSITIVITY)    EKG EKG Interpretation  Date/Time:  Monday July 21 2022 14:00:16 EST Ventricular Rate:  112 PR Interval:  188 QRS Duration: 132 QT Interval:  442 QTC Calculation: 603 R Axis:   -65 Text Interpretation: Sinus tachycardia with frequent Premature ventricular complexes Left axis deviation Right bundle branch block Anterolateral infarct (cited on or before 15-Oct-2021) Abnormal ECG When compared with ECG of 15-Oct-2021 11:09, Sinus rhythm has replaced Atrial fibrillation Questionable change in initial forces of Lateral leads T wave inversion now evident in Inferior leads T wave inversion now evident in Lateral leads No significant change since last tracing Confirmed by Isla Pence [phone]) on 07/21/2022 4:32:11 PM  Radiology DG Chest Port 1 View  Result Date: 07/21/2022 CLINICAL DATA:  Chest pain. EXAM: PORTABLE CHEST 1 VIEW COMPARISON:  10/15/2021. FINDINGS: Cardiac silhouette mildly enlarged.  No mediastinal or hilar masses. Clear lungs.  No convincing pleural effusion.  No pneumothorax. Skeletal structures are grossly intact. IMPRESSION: No active disease. Electronically Signed   By: Lajean Manes M.D.   On: 07/21/2022 15:56    Procedures Procedures    Medications Ordered in ED Medications  acetaminophen (TYLENOL) tablet 1,000 mg (1,000 mg Oral Given 07/21/22 1717)  metoprolol tartrate (LOPRESSOR) injection 5 mg (5 mg Intravenous Given 07/21/22 1718)    ED Course/ Medical Decision Making/ A&P                              Medical Decision Making Amount and/or Complexity of Data Reviewed Labs: ordered.  Risk OTC drugs. Prescription drug management.   This patient presents to the ED for concern of cp, this involves an extensive number of treatment options, and is a complaint that carries with it a high risk of complications and morbidity.  The differential diagnosis includes cardiac, afib, pe, pna, covid/flu   Co morbidities that complicate  the patient evaluation  HLD, pafib, tia, oa, tia, metabolic x syndrome, and aortic atherosclerosis   Additional history obtained:  Additional history obtained from epic chart review    Lab Tests:  I Ordered, and personally interpreted labs.  The pertinent results include:  cbc nl, bmp nl, trop nl, covid/flu/rsv neg; ddimer neg, inr 2.0   Imaging Studies ordered:  I ordered imaging studies including cxr  I independently visualized and interpreted imaging which showed No active disease.  I agree with the radiologist interpretation   Cardiac Monitoring:  The patient was maintained on a cardiac monitor.  I personally viewed and interpreted the cardiac monitored which showed an underlying rhythm of: afib   Medicines ordered and prescription drug management:  I ordered medication including lopressor  for afib rate  Reevaluation of the patient after these medicines showed that the patient improved I have reviewed the patients home medicines and have made adjustments as needed    Critical Interventions:  Rate control  Problem List / ED Course:  Cp:  atypical.  Cardiac eval neg.  Cp better with rate under control.  Pt encouraged to take her daily meds.  Return if worse. F/u with Dr. Harl Bowie.   Reevaluation:  After the interventions noted above, I reevaluated the patient and found that they have :improved   Social Determinants of Health:  Lives at home   Dispostion:  After consideration of the diagnostic results and the patients  response to treatment, I feel that the patent would benefit from discharge with outpatient f/u.          Final Clinical Impression(s) / ED Diagnoses Final diagnoses:  Paroxysmal atrial fibrillation [facility])    Rx / DC Orders ED Discharge Orders     None         Isla Pence, MD 07/21/22 [phone]

## 2022-07-21 NOTE — Progress Notes (Signed)
SATURATION QUALIFICATIONS: (This note is used to comply with regulatory documentation for home oxygen)  Patient Saturations on Room Air at Rest = 98%  Patient Saturations on Room Air while Ambulating = 92%  Patient Saturations on 0 Liters of oxygen while Ambulating = 92%  Please briefly explain why patient needs home oxygen:

## 2022-07-21 NOTE — ED Notes (Signed)
Husband states she has not eaten anything today or taken her medications, she is borderline diabetic and can not afford the heart medicine Dr. Harl Bowie wants her to take. She also stated she did not know how much she weighs and bed scale broken.

## 2022-07-21 NOTE — ED Notes (Signed)
Patient ambulated from room 7 down to room 1 at rest O2 sats 98 went as low as 92 but bounced back on RA and does not wear O2 at home but has hx of CHF.

## 2022-07-24 ENCOUNTER — Other Ambulatory Visit: Payer: Self-pay | Admitting: Family Medicine

## 2022-07-28 ENCOUNTER — Ambulatory Visit: Payer: Medicare Other | Attending: Cardiology | Admitting: *Deleted

## 2022-07-28 DIAGNOSIS — Z5181 Encounter for therapeutic drug level monitoring: Secondary | ICD-10-CM

## 2022-07-28 DIAGNOSIS — I480 Paroxysmal atrial fibrillation: Secondary | ICD-10-CM | POA: Diagnosis not present

## 2022-07-28 LAB — POCT INR: INR: 3.1 — AB (ref 2.0–3.0)

## 2022-07-28 NOTE — Patient Instructions (Signed)
Continue warfarin 1 tablet daily   Eat extra greens tonight Recheck INR in 4 wks.

## 2022-07-29 ENCOUNTER — Telehealth: Payer: Self-pay

## 2022-07-29 NOTE — Telephone Encounter (Signed)
     Patient  visit on 2/5  at [place] you been able to follow up with your primary care physician? Yes   The patient was or was not able to obtain any needed medicine or equipment. Yes   Are there diet recommendations that you are having difficulty following? Na     Patient expresses understanding of discharge instructions and education provided has no other needs at this time.  Yes     [facility] [phone] [address], [place], [place] [address] Phone: [phone] Email: [email or web address]

## 2022-08-14 ENCOUNTER — Other Ambulatory Visit: Payer: Self-pay | Admitting: Family Medicine

## 2022-08-15 ENCOUNTER — Encounter: Payer: Medicare Other | Admitting: Family Medicine

## 2022-08-21 ENCOUNTER — Encounter: Payer: Self-pay | Admitting: Family Medicine

## 2022-08-21 ENCOUNTER — Ambulatory Visit (INDEPENDENT_AMBULATORY_CARE_PROVIDER_SITE_OTHER): Payer: Medicare Other | Admitting: Family Medicine

## 2022-08-21 ENCOUNTER — Telehealth: Payer: Self-pay | Admitting: Pharmacist

## 2022-08-21 VITALS — BP 105/79 | HR 81 | Ht 62.0 in | Wt 196.2 lb

## 2022-08-21 DIAGNOSIS — E1159 Type 2 diabetes mellitus with other circulatory complications: Secondary | ICD-10-CM | POA: Diagnosis not present

## 2022-08-21 DIAGNOSIS — Z1231 Encounter for screening mammogram for malignant neoplasm of breast: Secondary | ICD-10-CM

## 2022-08-21 DIAGNOSIS — Z0001 Encounter for general adult medical examination with abnormal findings: Secondary | ICD-10-CM

## 2022-08-21 DIAGNOSIS — Z23 Encounter for immunization: Secondary | ICD-10-CM | POA: Diagnosis not present

## 2022-08-21 DIAGNOSIS — I739 Peripheral vascular disease, unspecified: Secondary | ICD-10-CM | POA: Diagnosis not present

## 2022-08-21 DIAGNOSIS — R0683 Snoring: Secondary | ICD-10-CM

## 2022-08-21 DIAGNOSIS — E785 Hyperlipidemia, unspecified: Secondary | ICD-10-CM

## 2022-08-21 DIAGNOSIS — I10 Essential (primary) hypertension: Secondary | ICD-10-CM

## 2022-08-21 DIAGNOSIS — E559 Vitamin D deficiency, unspecified: Secondary | ICD-10-CM | POA: Diagnosis not present

## 2022-08-21 NOTE — Patient Instructions (Addendum)
F/U in 4 months, call if you need me sooner  Flu vaccine today  Covid vaccine at pharamcy in 2 weeks, you also need TdAP at pharmacy  Microalb, lipid, cmp and EGFr, TSH, HBa1C vit D today  Pls sched mammogram at checkout  Nurse pls send pharmacy consult/ assistance for entresto, pt unable to afford, not taking, I will also message cardiology  You will be referred to vascular specialist re circulation in feet, to Ophthalmology and  also to Pulmonary re snoring  Thanks for choosing [facility], we consider it a privelige to serve you.

## 2022-08-21 NOTE — Progress Notes (Signed)
,                                         [facility])                                            [place] Team    08/21/2022  Samantha Vega 12/09/1950 [id number]  Patient was referred for medication assistance for [facility].  Unfortunately she did not answer the phone. HIPAA compliant message was left on her mobile number.  No answer or voicemail on the home number.   Plan: Will call patient back in 3-5 business days.  Elayne Guerin, PharmD, [place] Clinical Pharmacist [phone]

## 2022-08-21 NOTE — Progress Notes (Signed)
Samantha Vega     MRN: [id number]      DOB: 03-04-1951  HPI: Patient is in for annual physical exam. Unable to afford entresto, not taking it C/o darkening of legs and also excess snoring wih fatigueImmunization is reviewed , and  updated if needed.   PE: BP 105/79 (BP Location: Right Arm, Patient Position: Sitting, Cuff Size: Large)   Pulse 81   Ht '5\' 2"'$  (1.575 m)   Wt 196 lb 3.2 oz (89 kg)   SpO2 94%   BMI 35.89 kg/m   Pleasant  female, alert and oriented x 3, in no cardio-pulmonary distress. Afebrile. HEENT No facial trauma or asymetry. Sinuses non tender.  Extra occullar muscles intact.. External ears normal, . Neck: decreased ROM, no adenopathy,JVD or thyromegaly.No bruits.  Chest: Clear to ascultation bilaterally.No crackles or wheezes.decreased breath sounds Non tender to palpation  Breast: Not examined Cardiovascular system; Heart sounds normal,  S1 and  S2 ,no S3.  No murmur, or thrill. Apical beat not displaced Peripheral pulses decreased in feet  Abdomen: Soft, non tender, no organomegaly or masses. No bruits. Bowel sounds normal. No guarding, tenderness or rebound.   Musculoskeletal exam: Decreased  ROM of spine, hips , shoulders and knees.  deformity ,swelling and  crepitus noted. No muscle wasting or atrophy.   Neurologic: Cranial nerves 2 to 12 intact. Power, tone ,sensation  normal throughout.  disturbance in gait. No tremor.  Skin: Intact, no ulceration, erythema , scaling or rash noted. Pigmentation normal throughout  Psych; Normal mood and affect. Judgement and concentration normal   Assessment & Plan:  Annual visit for general adult medical examination with abnormal findings Annual exam as documented. Counseling done  re healthy lifestyle involving commitment to 150 minutes exercise per week, heart healthy diet, and attaining healthy weight.The importance of adequate sleep also discussed. Regular seat belt use and home  safety, is also discussed. Changes in health habits are decided on by the patient with goals and time frames  set for achieving them. Immunization and cancer screening needs are specifically addressed at this visit.   Type 2 diabetes mellitus with vascular disease ([place]) Samantha Vega is reminded of the importance of commitment to daily physical activity for 30 minutes or more, as able and the need to limit carbohydrate intake to 30 to 60 grams per meal to help with blood sugar control.    Samantha Vega is reminded of the importance of daily foot exam, annual eye examination, and good blood sugar, blood pressure and cholesterol control.     Latest Ref Rng & Units 08/21/2022    9:36 AM 07/21/2022    2:16 PM 02/13/2022    9:03 AM 10/29/2021    8:27 AM 10/15/2021   11:27 AM  Diabetic Labs  HbA1c 4.8 - 5.6 % 6.3   6.3     Micro/Creat Ratio 0 - 29 mg/g creat 9       Chol 100 - 199 mg/dL 160   160     HDL >39 mg/dL 38   40     Calc LDL 0 - 99 mg/dL 103   101     Triglycerides 0 - 149 mg/dL 100   101     Creatinine 0.57 - 1.00 mg/dL 0.90  0.84  0.93  0.85  1.09       08/21/2022    8:13 AM 07/21/2022    7:02 PM 07/21/2022    6:30 PM 07/21/2022    6:00  PM 07/21/2022    5:30 PM 07/21/2022    4:53 PM 07/21/2022    1:57 PM  BP/Weight  Systolic BP 123456 [id number] 0000000 [id number] [id number] Q000111Q  Diastolic BP 79 98 87 83 94 89 95  Wt. (Lbs) 196.2        BMI 35.89 kg/m2            Latest Ref Rng & Units 11/01/2021    8:20 AM 09/10/2021   12:00 AM  Foot/eye exam completion dates  Eye Exam No Retinopathy  No Retinopathy      Foot Form Completion  Done      This result is from an external source.        Essential hypertension Controlled, no change in medication DASH diet and commitment to daily physical activity for a minimum of 30 minutes discussed and encouraged, as a part of hypertension management. The importance of attaining a healthy weight is also discussed.     08/21/2022    8:13 AM 07/21/2022    7:02 PM 07/21/2022     6:30 PM 07/21/2022    6:00 PM 07/21/2022    5:30 PM 07/21/2022    4:53 PM 07/21/2022    1:57 PM  BP/Weight  Systolic BP 123456 [id number] 0000000 [id number] Q000111Q [id number] Q000111Q  Diastolic BP 79 98 87 83 94 89 95  Wt. (Lbs) 196.2        BMI 35.89 kg/m2             Hyperlipidemia LDL goal <70 Hyperlipidemia:Low fat diet discussed and encouraged.   Lipid Panel  Lab Results  Component Value Date   CHOL 160 08/21/2022   HDL 38 (L) 08/21/2022   LDLCALC 103 (H) 08/21/2022   TRIG 100 08/21/2022   CHOLHDL 4.2 08/21/2022     Needs to reduce fat intake  Morbid obesity ([place])  Patient re-educated about  the importance of commitment to a  minimum of 150 minutes of exercise per week as able.  The importance of healthy food choices with portion control discussed, as well as eating regularly and within a 12 hour window most days. The need to choose "clean , green" food 50 to 75% of the time is discussed, as well as to make water the primary drink and set a goal of 64 ounces water daily.       08/21/2022    8:13 AM 07/21/2022    4:53 PM 05/07/2022    9:39 AM  Weight /BMI  Weight 196 lb 3.2 oz  200 lb  Height '5\' 2"'$  (1.575 m) '5\' 2"'$  (1.575 m) '5\' 2"'$  (1.575 m)  BMI 35.89 kg/m2 36.58 kg/m2 36.58 kg/m2    Check TSH  PVD (peripheral vascular disease) (HCC) Decreased DP and skin changes c/w PAD, refer for eveal  Habitual snoring Habitual snoring and fatigue refer for sleep apnea eval

## 2022-08-22 ENCOUNTER — Telehealth: Payer: Self-pay | Admitting: Cardiology

## 2022-08-22 ENCOUNTER — Telehealth: Payer: Self-pay | Admitting: Family Medicine

## 2022-08-22 NOTE — Telephone Encounter (Signed)
error 

## 2022-08-22 NOTE — Telephone Encounter (Signed)
One bottle of samples given #28 tablets. Lot # [id number], EXP: April 2026. Pt completed [employer] application in office. Pt to bring back SS statement and amount paid in meds.

## 2022-08-22 NOTE — Telephone Encounter (Signed)
Patient walked in with concerns with the price of her entresto she says she can't afford it. She hasn't started it because of the price. She saw Dr. Moshe Cipro yesterday and she was told she really needs to take this medicine. The pharmacy told he it would be $482 for 30-day supply and, $576 for 90-day supply.  Please advise   I did ask if she had did financial assistance and, she said she did last year but didn't hear back

## 2022-08-24 LAB — VITAMIN D 25 HYDROXY (VIT D DEFICIENCY, FRACTURES): Vit D, 25-Hydroxy: 35.6 ng/mL (ref 30.0–100.0)

## 2022-08-24 LAB — MICROALBUMIN / CREATININE URINE RATIO
Creatinine, Urine: 286.1 mg/dL
Microalb/Creat Ratio: 9 mg/g creat (ref 0–29)
Microalbumin, Urine: 24.7 ug/mL

## 2022-08-24 LAB — CMP14+EGFR
ALT: 14 IU/L (ref 0–32)
AST: 18 IU/L (ref 0–40)
Albumin/Globulin Ratio: 1.4 (ref 1.2–2.2)
Albumin: 4.3 g/dL (ref 3.8–4.8)
Alkaline Phosphatase: 127 IU/L — ABNORMAL HIGH (ref 44–121)
BUN/Creatinine Ratio: 13 (ref 12–28)
BUN: 12 mg/dL (ref 8–27)
Bilirubin Total: 0.4 mg/dL (ref 0.0–1.2)
CO2: 25 mmol/L (ref 20–29)
Calcium: 9.6 mg/dL (ref 8.7–10.3)
Chloride: 102 mmol/L (ref 96–106)
Creatinine, Ser: 0.9 mg/dL (ref 0.57–1.00)
Globulin, Total: 3 g/dL (ref 1.5–4.5)
Glucose: 87 mg/dL (ref 70–99)
Potassium: 3.7 mmol/L (ref 3.5–5.2)
Sodium: 141 mmol/L (ref 134–144)
Total Protein: 7.3 g/dL (ref 6.0–8.5)
eGFR: 68 mL/min/{1.73_m2} (ref >59)

## 2022-08-24 LAB — LIPID PANEL
Chol/HDL Ratio: 4.2 ratio (ref 0.0–4.4)
Cholesterol, Total: 160 mg/dL (ref 100–199)
HDL: 38 mg/dL — ABNORMAL LOW (ref >39)
LDL Chol Calc (NIH): 103 mg/dL — ABNORMAL HIGH (ref 0–99)
Triglycerides: 100 mg/dL (ref 0–149)
VLDL Cholesterol Cal: 19 mg/dL (ref 5–40)

## 2022-08-24 LAB — HEMOGLOBIN A1C
Est. average glucose Bld gHb Est-mCnc: 134 mg/dL
Hgb A1c MFr Bld: 6.3 % — ABNORMAL HIGH (ref 4.8–5.6)

## 2022-08-24 LAB — TSH: TSH: 1.94 u[IU]/mL (ref 0.450–4.500)

## 2022-08-25 ENCOUNTER — Ambulatory Visit: Payer: Medicare Other | Attending: Cardiology | Admitting: *Deleted

## 2022-08-25 ENCOUNTER — Telehealth: Payer: Self-pay | Admitting: Pharmacist

## 2022-08-25 DIAGNOSIS — Z5181 Encounter for therapeutic drug level monitoring: Secondary | ICD-10-CM

## 2022-08-25 DIAGNOSIS — I480 Paroxysmal atrial fibrillation: Secondary | ICD-10-CM | POA: Diagnosis not present

## 2022-08-25 LAB — POCT INR: INR: 2 (ref 2.0–3.0)

## 2022-08-25 NOTE — Progress Notes (Unsigned)
[facility]) Care Management  [place]   08/25/2022  Samantha Vega 1950/12/21 [id number]  Reason for referral: medication assistance  Referral source: [facility] CM Referral medication(s): Entresto Current insurance:[employer]  Assessment:  Medication Assistance Findings:  Medication assistance needs identified: Spoke with patient briefly. HIPAA identifiers were obtained.  She reported having difficulty affording Entresto.  From what she reported, she and her husband MAY qualify for LIS/Extra Help.  She said she was in the car and could not complete the application with me at that time and asked that I call her back.  I called her back 1.5 hours after our initial conversation and again today.  She did not answer the phone. Voicemail messages were left for her on both occasions.       Additional medication assistance options reviewed with patient as warranted:  No other options identified  Plan: Call patient back in 3-5 business days.  Elayne Guerin, PharmD, [place] Clinical Pharmacist [phone]

## 2022-08-25 NOTE — Patient Instructions (Signed)
Continue warfarin 1 tablet daily   Continue greens Recheck INR in 4 wks.

## 2022-08-27 ENCOUNTER — Encounter: Payer: Self-pay | Admitting: Family Medicine

## 2022-08-27 ENCOUNTER — Telehealth: Payer: Self-pay | Admitting: *Deleted

## 2022-08-27 DIAGNOSIS — Z0001 Encounter for general adult medical examination with abnormal findings: Secondary | ICD-10-CM | POA: Insufficient documentation

## 2022-08-27 NOTE — Assessment & Plan Note (Signed)

## 2022-08-27 NOTE — Assessment & Plan Note (Signed)
Hyperlipidemia:Low fat diet discussed and encouraged.   Lipid Panel  Lab Results  Component Value Date   CHOL 160 08/21/2022   HDL 38 (L) 08/21/2022   LDLCALC 103 (H) 08/21/2022   TRIG 100 08/21/2022   CHOLHDL 4.2 08/21/2022     Needs to reduce fat intake

## 2022-08-27 NOTE — Assessment & Plan Note (Signed)
  Patient re-educated about  the importance of commitment to a  minimum of 150 minutes of exercise per week as able.  The importance of healthy food choices with portion control discussed, as well as eating regularly and within a 12 hour window most days. The need to choose "clean , green" food 50 to 75% of the time is discussed, as well as to make water the primary drink and set a goal of 64 ounces water daily.       08/21/2022    8:13 AM 07/21/2022    4:53 PM 05/07/2022    9:39 AM  Weight /BMI  Weight 196 lb 3.2 oz  200 lb  Height 5\' 2"  (1.575 m) 5\' 2"  (1.575 m) 5\' 2"  (1.575 m)  BMI 35.89 kg/m2 36.58 kg/m2 36.58 kg/m2    Check TSH

## 2022-08-27 NOTE — Assessment & Plan Note (Signed)
Controlled, no change in medication DASH diet and commitment to daily physical activity for a minimum of 30 minutes discussed and encouraged, as a part of hypertension management. The importance of attaining a healthy weight is also discussed.     08/21/2022    8:13 AM 07/21/2022    7:02 PM 07/21/2022    6:30 PM 07/21/2022    6:00 PM 07/21/2022    5:30 PM 07/21/2022    4:53 PM 07/21/2022    1:57 PM  BP/Weight  Systolic BP 123456 [id number] 0000000 [id number] [id number] [id number] Q000111Q  Diastolic BP 79 98 87 83 94 89 95  Wt. (Lbs) 196.2        BMI 35.89 kg/m2

## 2022-08-27 NOTE — Progress Notes (Incomplete)
    Samantha Vega     MRN: [id number]      DOB: 18-Jul-1950  HPI: Patient is in for annual physical exam. Unable to afford entresto, not taking it C/o darkening of legs and also excess snoring wih fatigueImmunization is reviewed , and  updated if needed.   PE: BP 105/79 (BP Location: Right Arm, Patient Position: Sitting, Cuff Size: Large)   Pulse 81   Ht 5\' 2"  (1.575 m)   Wt 196 lb 3.2 oz (89 kg)   SpO2 94%   BMI 35.89 kg/m   Pleasant  female, alert and oriented x 3, in no cardio-pulmonary distress. Afebrile. HEENT No facial trauma or asymetry. Sinuses non tender.  Extra occullar muscles intact.. External ears normal, . Neck: decreased ROM, no adenopathy,JVD or thyromegaly.No bruits.  Chest: Clear to ascultation bilaterally.No crackles or wheezes.decreased breath sounds Non tender to palpation  Breast: Not examined Cardiovascular system; Heart sounds normal,  S1 and  S2 ,no S3.  No murmur, or thrill. Apical beat not displaced Peripheral pulses decreased in feet  Abdomen: Soft, non tender, no organomegaly or masses. No bruits. Bowel sounds normal. No guarding, tenderness or rebound.   Musculoskeletal exam: Decreased  ROM of spine, hips , shoulders and knees.  deformity ,swelling and  crepitus noted. No muscle wasting or atrophy.   Neurologic: Cranial nerves 2 to 12 intact. Power, tone ,sensation  normal throughout.  disturbance in gait. No tremor.  Skin: Intact, no ulceration, erythema , scaling or rash noted. Pigmentation normal throughout  Psych; Normal mood and affect. Judgement and concentration normal   Assessment & Plan:  No problem-specific Assessment & Plan notes found for this encounter.

## 2022-08-27 NOTE — Assessment & Plan Note (Addendum)
Ms. Golliday is reminded of the importance of commitment to daily physical activity for 30 minutes or more, as able and the need to limit carbohydrate intake to 30 to 60 grams per meal to help with blood sugar control.    Ms. Welcher is reminded of the importance of daily foot exam, annual eye examination, and good blood sugar, blood pressure and cholesterol control.     Latest Ref Rng & Units 08/21/2022    9:36 AM 07/21/2022    2:16 PM 02/13/2022    9:03 AM 10/29/2021    8:27 AM 10/15/2021   11:27 AM  Diabetic Labs  HbA1c 4.8 - 5.6 % 6.3   6.3     Micro/Creat Ratio 0 - 29 mg/g creat 9       Chol 100 - 199 mg/dL 160   160     HDL >39 mg/dL 38   40     Calc LDL 0 - 99 mg/dL 103   101     Triglycerides 0 - 149 mg/dL 100   101     Creatinine 0.57 - 1.00 mg/dL 0.90  0.84  0.93  0.85  1.09       08/21/2022    8:13 AM 07/21/2022    7:02 PM 07/21/2022    6:30 PM 07/21/2022    6:00 PM 07/21/2022    5:30 PM 07/21/2022    4:53 PM 07/21/2022    1:57 PM  BP/Weight  Systolic BP 123456 [id number] 0000000 [id number] Q000111Q AB-123456789 Q000111Q  Diastolic BP 79 98 87 83 94 89 95  Wt. (Lbs) 196.2        BMI 35.89 kg/m2            Latest Ref Rng & Units 11/01/2021    8:20 AM 09/10/2021   12:00 AM  Foot/eye exam completion dates  Eye Exam No Retinopathy  No Retinopathy      Foot Form Completion  Done      This result is from an external source.

## 2022-08-27 NOTE — Telephone Encounter (Signed)
Called to notify pt that she was approved for the [employer] Patient assistance foundation. Pt is approved through 06/16/2023.

## 2022-08-28 DIAGNOSIS — I739 Peripheral vascular disease, unspecified: Secondary | ICD-10-CM | POA: Insufficient documentation

## 2022-08-28 DIAGNOSIS — R0683 Snoring: Secondary | ICD-10-CM | POA: Insufficient documentation

## 2022-08-28 NOTE — Assessment & Plan Note (Signed)
Habitual snoring and fatigue refer for sleep apnea eval

## 2022-08-28 NOTE — Assessment & Plan Note (Addendum)
Decreased DP and skin changes c/w PAD, refer for eveal

## 2022-09-01 NOTE — Telephone Encounter (Signed)
Spoke with [employer] regarding patent's assistance. Pt was approved and needs to contact pharmacy at [phone]. Called and notified pt. Pt states that she will call now.

## 2022-09-03 ENCOUNTER — Ambulatory Visit (INDEPENDENT_AMBULATORY_CARE_PROVIDER_SITE_OTHER): Payer: Medicare Other

## 2022-09-03 ENCOUNTER — Ambulatory Visit (INDEPENDENT_AMBULATORY_CARE_PROVIDER_SITE_OTHER): Payer: Medicare Other | Admitting: Vascular Surgery

## 2022-09-03 ENCOUNTER — Encounter: Payer: Self-pay | Admitting: Vascular Surgery

## 2022-09-03 ENCOUNTER — Other Ambulatory Visit: Payer: Self-pay

## 2022-09-03 VITALS — BP 132/78 | HR 82 | Temp 97.0°F | Ht 62.0 in | Wt 196.8 lb

## 2022-09-03 DIAGNOSIS — I739 Peripheral vascular disease, unspecified: Secondary | ICD-10-CM | POA: Diagnosis not present

## 2022-09-03 DIAGNOSIS — M25579 Pain in unspecified ankle and joints of unspecified foot: Secondary | ICD-10-CM | POA: Diagnosis not present

## 2022-09-03 LAB — VAS US ABI WITH/WO TBI
Left ABI: 1.16
Right ABI: 1.11

## 2022-09-03 NOTE — Progress Notes (Signed)
[facility]ist of [place]  Patient name: Samantha Vega MRN: [id number] DOB: Sep 30, 1950 Sex: female  REASON FOR CONSULT: Evaluation lower extremity foot pain, rule out arterial insufficiency  HPI: Samantha Vega is a 72 y.o. female, who is here today for evaluation of foot pain.  He is here with her husband.  She reports that this is not related to activity.  She specifically denies calf claudication type symptoms.  She reports that her pain can occur with activity or rest.  She does have arthritis in her left knee as well.  No history of tissue loss.  Past Medical History:  Diagnosis Date   Annual physical exam 06/07/2015   Aortic atherosclerosis ([place])    Arthritis    Baker's cyst of knee 10/04/2013   Diabetes mellitus    Hyperlipidemia    Hypertension    Metabolic syndrome X [id number]   Mild carotid artery disease (HCC)    OA (osteoarthritis) of knee 05/05/2012   Left, takes tramadol on avg twice per week   Obesity    PAF (paroxysmal atrial fibrillation) (HCC)    Persistent atrial fibrillation (HCC)    Right bundle branch block    +Palpitations.   EKG 11/2006:  NSR, RBBB, LAFB, markedly delayed R-wave progression, no change; Echocardiogram in 10/2006-suboptimal quality, normal; Event recorder-PVCs, no significant arrhythmias or symptoms ;   TIA (transient ischemic attack) 06/02/2019   Unspecified visual loss 12/08/2008    Family History  Problem Relation Age of Onset   Hypertension Mother    Arthritis Mother    Stroke Father    Hypertension Father    Coronary artery disease Father    Diabetes Sister    Sarcoidosis Sister    Diabetes Sister    Heart failure Brother    Congestive Heart Failure Brother    Colon cancer Neg Hx     SOCIAL HISTORY: Social History   Socioeconomic History   Marital status: Married    Spouse name: Not on file   Number of children: 0   Years of education: Not on file   Highest  education level: Not on file  Occupational History   Occupation: Museum/gallery exhibitions officer: UNEMPLOYED  Tobacco Use   Smoking status: Never   Smokeless tobacco: Never  Vaping Use   Vaping Use: Never used  Substance and Sexual Activity   Alcohol use: No   Drug use: No   Sexual activity: Yes    Birth control/protection: Surgical  Other Topics Concern   Not on file  Social History Narrative   Not on file   Social Determinants of Health   Financial Resource Strain: Low Risk  (01/27/2022)   Overall Financial Resource Strain (CARDIA)    Difficulty of Paying Living Expenses: Not hard at all  Food Insecurity: No Food Insecurity (01/27/2022)   Hunger Vital Sign    Worried About Running Out of Food in the Last Year: Never true    Ran Out of Food in the Last Year: Never true  Transportation Needs: No Transportation Needs (01/27/2022)   PRAPARE - Hydrologist (Medical): No    Lack of Transportation (Non-Medical): No  Physical Activity: Inactive (01/27/2022)   Exercise Vital Sign    Days of Exercise per Week: 0 days    Minutes of Exercise per Session: 0 min  Stress: No Stress Concern Present (01/21/2021)   [place]  Feeling of Stress : Not at all  Social Connections: Moderately Integrated (01/27/2022)   Social Connection and Isolation Panel [NHANES]    Frequency of Communication with Friends and Family: More than three times a week    Frequency of Social Gatherings with Friends and Family: More than three times a week    Attends Religious Services: More than 4 times per year    Active Member of [employer] or Organizations: No    Attends Archivist Meetings: Never    Marital Status: Married  Human resources officer Violence: Not At Risk (01/21/2021)   Humiliation, Afraid, Rape, and Kick questionnaire    Fear of Current or Ex-Partner: No    Emotionally Abused: No    Physically Abused: No     Sexually Abused: No    No Known Allergies  Current Outpatient Medications  Medication Sig Dispense Refill   atorvastatin (LIPITOR) 80 MG tablet TAKE 1 TABLET BY MOUTH EVERY DAY 90 tablet 3   cholecalciferol (VITAMIN D3) 25 MCG (1000 UNIT) tablet Take 1,000 Units by mouth daily.     diltiazem (CARDIZEM CD) 120 MG 24 hr capsule TAKE 1 CAPSULE BY MOUTH EVERY DAY 90 capsule 2   furosemide (LASIX) 20 MG tablet TAKE 1 TABLET (20 MG TOTAL) BY MOUTH DAILY AS NEEDED FOR FLUID 90 tablet 1   metFORMIN (GLUCOPHAGE-XR) 500 MG 24 hr tablet TAKE 1 TABLET BY MOUTH EVERY DAY WITH BREAKFAST 90 tablet 1   metoprolol (TOPROL XL) 200 MG 24 hr tablet Take 1 tablet (200 mg total) by mouth daily. 90 tablet 3   montelukast (SINGULAIR) 10 MG tablet Take 1 tablet (10 mg total) by mouth at bedtime. 90 tablet 1   ofloxacin (OCUFLOX) 0.3 % ophthalmic solution Place 1 drop into both eyes 4 (four) times daily.     sacubitril-valsartan (ENTRESTO) 24-26 MG Take 1 tablet by mouth 2 (two) times daily. 180 tablet 3   traMADol (ULTRAM) 50 MG tablet TAKE 1 TABLET BY MOUTH EVERY 6 HOURS AS NEEDED 20 tablet 0   warfarin (COUMADIN) 5 MG tablet TAKE 1 TABLET DAILY EXCEPT 1/2 TABLET ON SUNDAY AND THURSDAY OR AS DIRECTED BY COUMADIN CLINIC 100 tablet 3   terbinafine (LAMISIL) 250 MG tablet TAKE 1 TABLET BY MOUTH EVERY DAY (Patient not taking: Reported on 08/21/2022) 42 tablet 1   No current facility-administered medications for this visit.    REVIEW OF SYSTEMS:  [X]  denotes positive finding, [ ]  denotes negative finding Cardiac  Comments:  Chest pain or chest pressure:    Shortness of breath upon exertion:    Short of breath when lying flat:    Irregular heart rhythm:        Vascular    Pain in calf, thigh, or hip brought on by ambulation:    Pain in feet at night that wakes you up from your sleep:     Blood clot in your veins:    Leg swelling:         Pulmonary    Oxygen at home:    Productive cough:     Wheezing:          Neurologic    Sudden weakness in arms or legs:     Sudden numbness in arms or legs:     Sudden onset of difficulty speaking or slurred speech:    Temporary loss of vision in one eye:     Problems with dizziness:         Gastrointestinal  Blood in stool:     Vomited blood:         Genitourinary    Burning when urinating:     Blood in urine:        Psychiatric    Major depression:         Hematologic    Bleeding problems:    Problems with blood clotting too easily:        Skin    Rashes or ulcers:        Constitutional    Fever or chills:      PHYSICAL EXAM: Vitals:   09/03/22 0947  BP: 132/78  Pulse: 82  Temp: (!) 97 F (36.1 C)  SpO2: 97%  Weight: 196 lb 12.8 oz (89.3 kg)  Height: 5\' 2"  (1.575 m)    GENERAL: The patient is a well-nourished female, in no acute distress. The vital signs are documented above. CARDIOVASCULAR: 2+ radial pulses bilaterally.  2+ dorsalis pedis pulses bilaterally PULMONARY: There is good air exchange  MUSCULOSKELETAL: There are no major deformities or cyanosis. NEUROLOGIC: No focal weakness or paresthesias are detected. SKIN: There are no ulcers or rashes noted. PSYCHIATRIC: The patient has a normal affect.  DATA:  Noninvasive studies today in our office revealed normal ankle arm index and normal toe waveforms and pressures bilaterally  MEDICAL ISSUES: I discussed these findings with the patient and her husband.  She has no evidence of lower extremity arterial insufficiency.  She will see [place] again on an as-needed basis   Rosetta Posner, MD [facility] Tel [phone] Pager [phone]  Note: Portions of this report may have been transcribed using voice recognition software.  Every effort has been made to ensure accuracy; however, inadvertent computerized transcription errors may still be present.

## 2022-09-08 ENCOUNTER — Telehealth: Payer: Self-pay | Admitting: Pharmacist

## 2022-09-08 NOTE — Progress Notes (Signed)
[facility])                                            [place] Team    09/08/2022  Samantha Vega [birth year] [id number]  Patient was called regarding patient assistance with [facility].  HIPAA identifiers were obtained.  Patient reported she was able to get Entresto at no cost.  After review of her chart, it was discovered her Cardiologist's office had already completed the necessary forms and the patient was approved to receive Entresto.   Plan: Close patient's [employer] protocol.    Elayne Guerin, PharmD, [place] Clinical Pharmacist [phone]

## 2022-09-15 ENCOUNTER — Other Ambulatory Visit: Payer: Self-pay | Admitting: Orthopedic Surgery

## 2022-09-15 MED ORDER — TRAMADOL HCL 50 MG PO TABS: 50.0000 mg | ORAL_TABLET | Freq: Four times a day (QID) | ORAL | 0 refills | Status: DC | PRN

## 2022-09-15 NOTE — Telephone Encounter (Signed)
Patient spouse came in the office requesting a refill for her medicine.   traMADol (ULTRAM) 50 MG tablet   Pharmacy:  [facility]

## 2022-09-23 ENCOUNTER — Ambulatory Visit: Payer: Medicare Other | Attending: Cardiology | Admitting: *Deleted

## 2022-09-23 DIAGNOSIS — I480 Paroxysmal atrial fibrillation: Secondary | ICD-10-CM

## 2022-09-23 DIAGNOSIS — Z5181 Encounter for therapeutic drug level monitoring: Secondary | ICD-10-CM | POA: Diagnosis not present

## 2022-09-23 LAB — POCT INR: INR: 4 — AB (ref 2.0–3.0)

## 2022-09-23 NOTE — Patient Instructions (Signed)
Hold warfarin tonight then resume 1 tablet daily   Continue greens Recheck INR in 3 wks.

## 2022-10-08 ENCOUNTER — Ambulatory Visit (INDEPENDENT_AMBULATORY_CARE_PROVIDER_SITE_OTHER): Payer: Medicare Other | Admitting: Podiatry

## 2022-10-08 DIAGNOSIS — M79674 Pain in right toe(s): Secondary | ICD-10-CM | POA: Diagnosis not present

## 2022-10-08 DIAGNOSIS — E119 Type 2 diabetes mellitus without complications: Secondary | ICD-10-CM

## 2022-10-08 DIAGNOSIS — M79675 Pain in left toe(s): Secondary | ICD-10-CM

## 2022-10-08 DIAGNOSIS — B351 Tinea unguium: Secondary | ICD-10-CM | POA: Diagnosis not present

## 2022-10-08 NOTE — Progress Notes (Signed)
  Subjective:  Patient ID: Samantha Vega, female    DOB: July 07, 1950,  MRN: [id number]  Samantha Vega presents to clinic today for preventative diabetic foot care and callus(es) b/l lower extremities and painful thick toenails that are difficult to trim. Painful toenails interfere with ambulation. Aggravating factors include wearing enclosed shoe gear. Pain is relieved with periodic professional debridement. Painful calluses are aggravated when weightbearing with and without shoegear. Pain is relieved with periodic professional debridement.  Chief Complaint  Patient presents with   Nail Problem    Nail trim    New problem(s): None.   PCP is Kerri Perches, MD.  No Known Allergies  Review of Systems: Negative except as noted in the HPI.  Objective:  There were no vitals filed for this visit.  Samantha Vega is a pleasant 72 y.o. female in NAD. AAO x 3. Vascular Examination: CFT <3 seconds b/l LE. Palpable DP pulse(s) b/l LE. Faintly palpable PT pulse(s) b/l LE. Trace edema noted BLE. No ischemia or gangrene noted b/l LE. No cyanosis or clubbing noted b/l LE.  Dermatological Examination: Pedal integument with normal turgor, texture and tone b/l LE. No open wounds b/l. No interdigital macerations b/l. Toenails 1-5 b/l elongated, thickened, discolored with subungual debris. +Tenderness with dorsal palpation of nailplates.   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 No erythema, no edema, no drainage, no fluctuance.   Neurological Examination: Protective sensation intact 5/5 intact bilaterally with 10g monofilament b/l. Vibratory sensation intact b/l.  Musculoskeletal Examination: Normal muscle strength 5/5 to all lower extremity muscle groups bilaterally. 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 No pain, crepitus or joint limitation noted with ROM b/l LE.  Patient ambulates independently without assistive aids.  Assessment/Plan: No diagnosis found.    -Consent given for treatment as described below: -Examined patient. -Continue foot and shoe inspections daily. Monitor blood glucose per PCP/Endocrinologist's recommendations. -Continue supportive shoe gear daily. 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 -Callus(es) L hallux and R hallux pared utilizing sterile scalpel blade without complication or incident. Total number debrided =2. -Patient/POA to call should there be question/concern in the interim.   No follow-ups on file.  Candelaria Stagers, DPM

## 2022-10-14 ENCOUNTER — Ambulatory Visit: Payer: Medicare Other | Attending: Cardiology | Admitting: *Deleted

## 2022-10-14 DIAGNOSIS — I480 Paroxysmal atrial fibrillation: Secondary | ICD-10-CM

## 2022-10-14 DIAGNOSIS — Z5181 Encounter for therapeutic drug level monitoring: Secondary | ICD-10-CM

## 2022-10-14 LAB — POCT INR: INR: 1.8 — AB (ref 2.0–3.0)

## 2022-10-14 IMAGING — MG MM DIGITAL SCREENING BILAT W/ TOMO AND CAD
8 of 14 series · 8 of 40 positions shown · non-contrast
Comparison: Previous exam(s).

CLINICAL DATA: Screening.

EXAM:
DIGITAL SCREENING BILATERAL MAMMOGRAM WITH TOMOSYNTHESIS AND CAD
TECHNIQUE: Bilateral screening digital craniocaudal and mediolateral oblique
mammograms were obtained. Bilateral screening digital breast
tomosynthesis was performed. The images were evaluated with
computer-aided detection.

[R CC synth-2D]
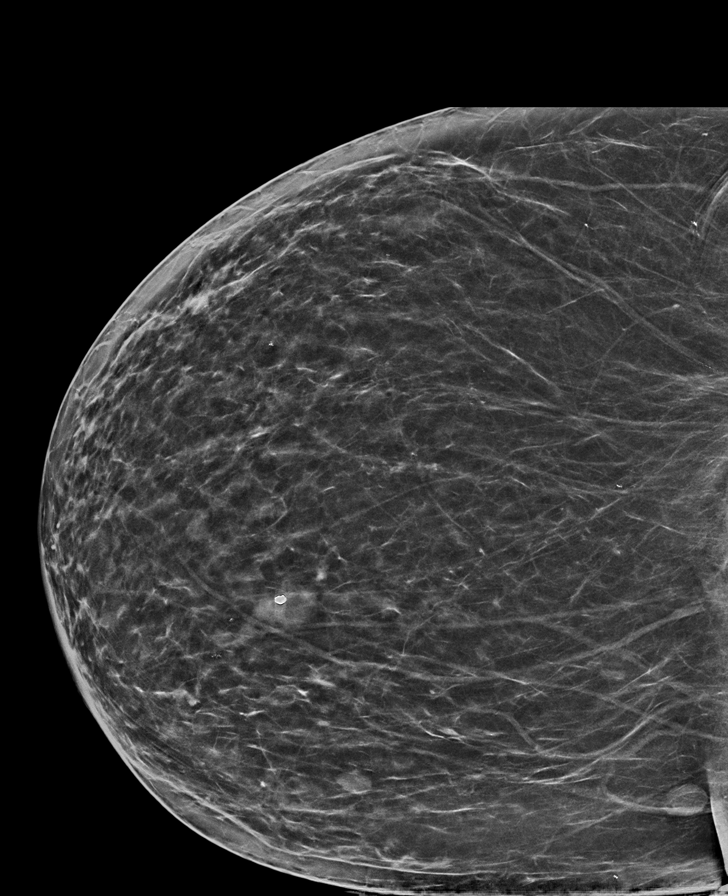

[R MLO synth-2D (1 of 2)]
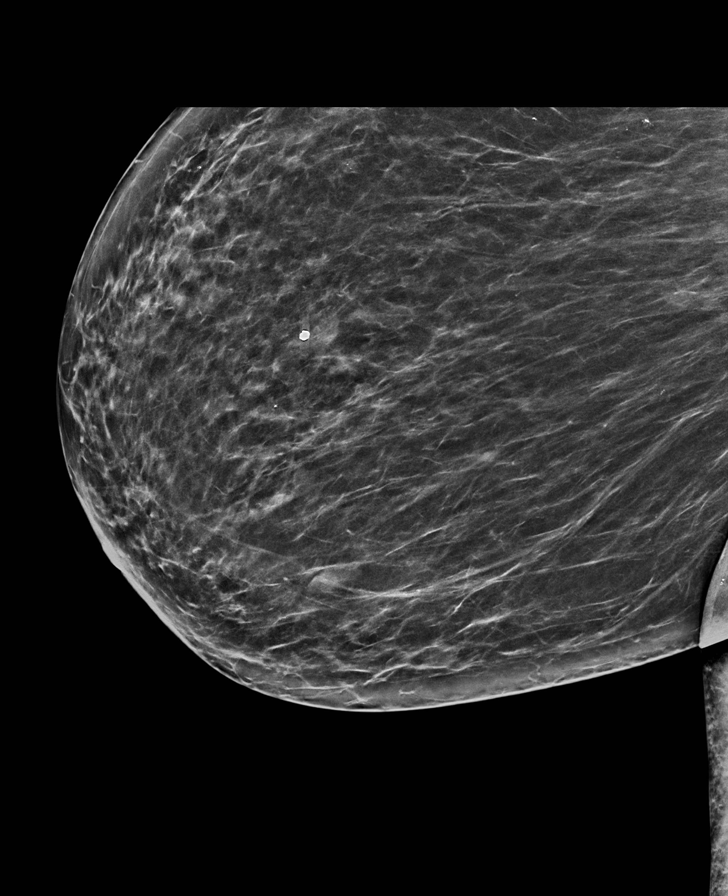

[L MLO synth-2D (1 of 2)]
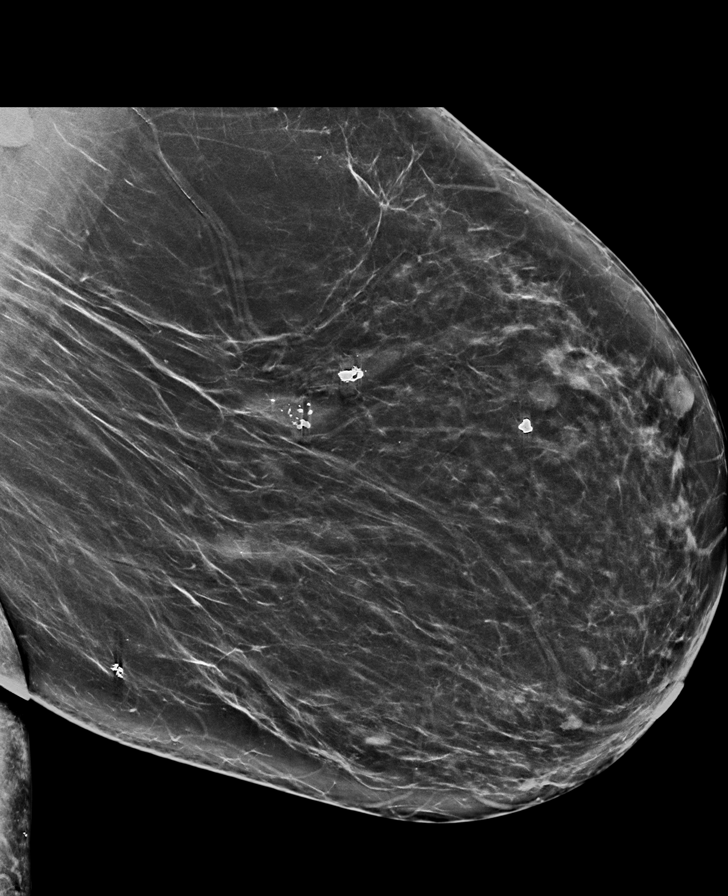

[L CC synth-2D]
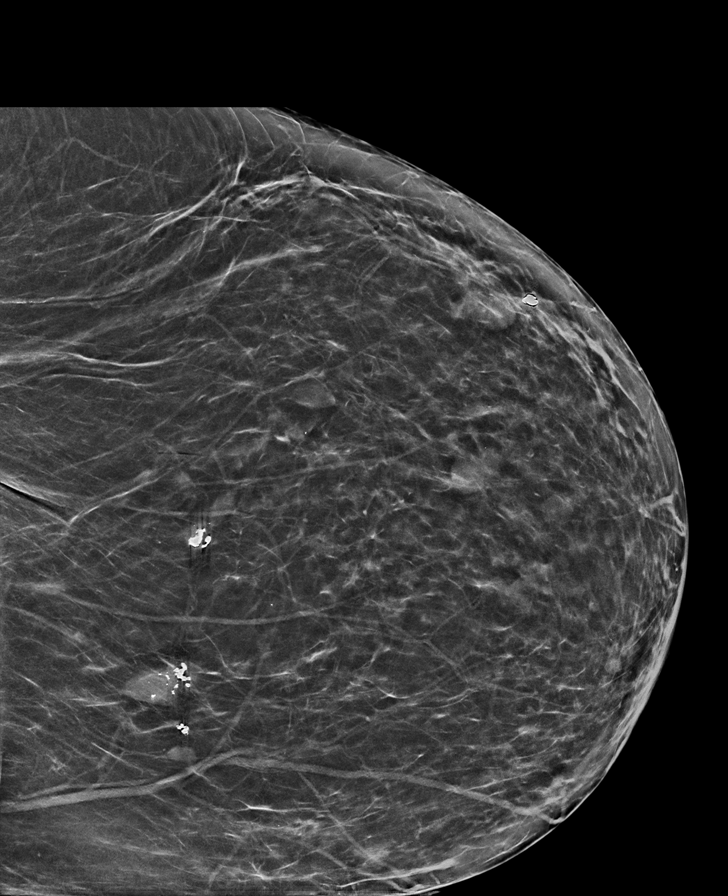

[R MLO synth-2D (2 of 2)]
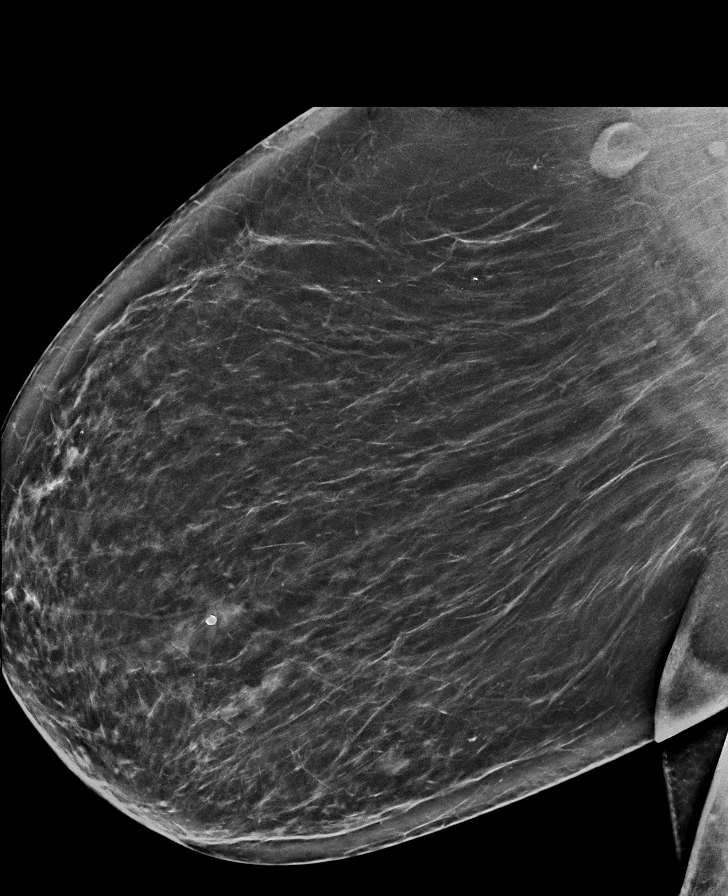

[R CV synth-2D]
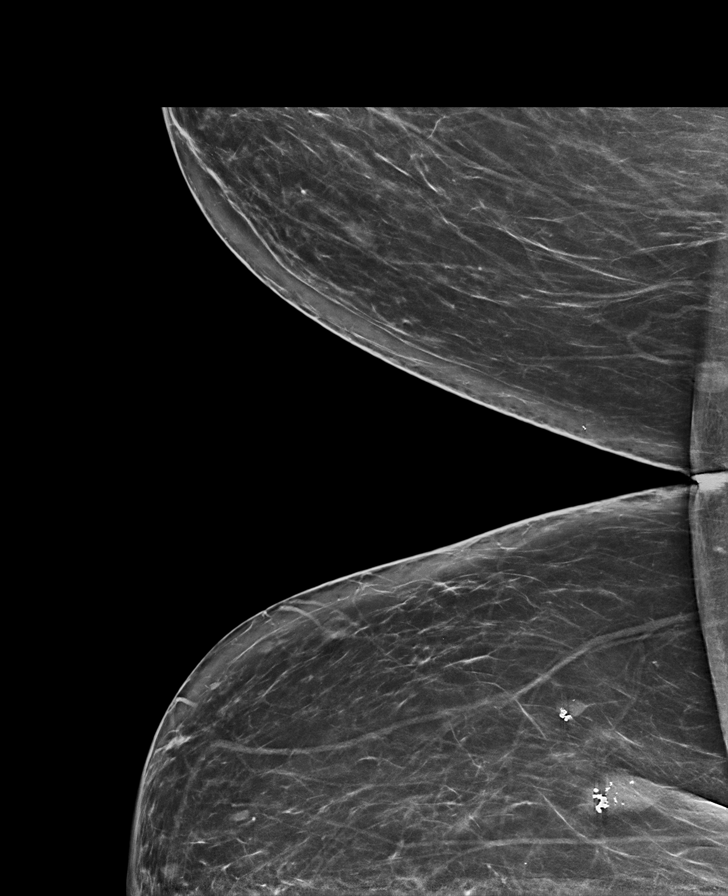

[L MLO synth-2D (2 of 2)]
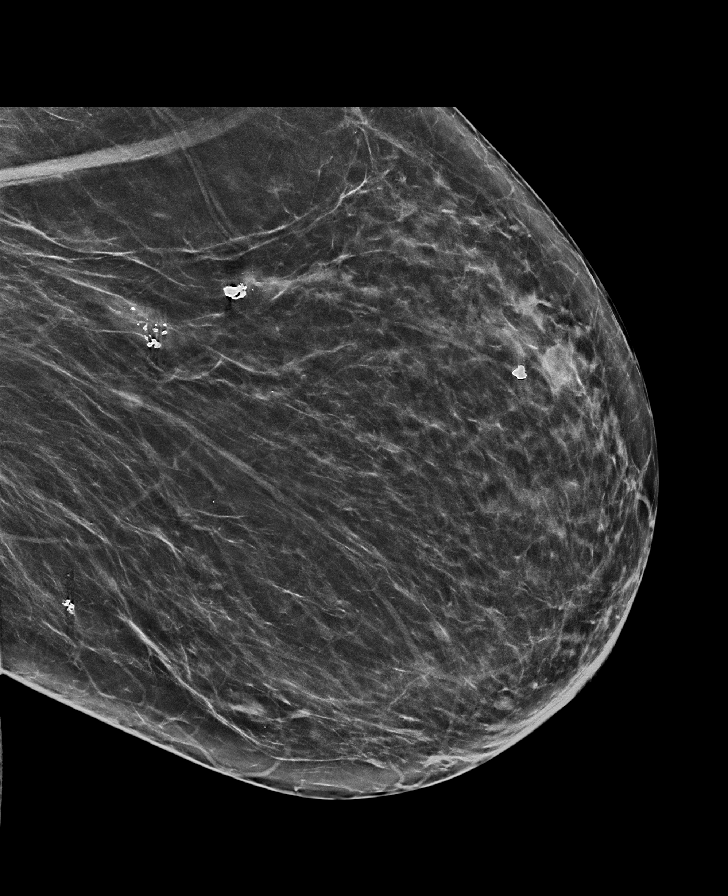

[R CV tomo · tomo slice 38/75.0]
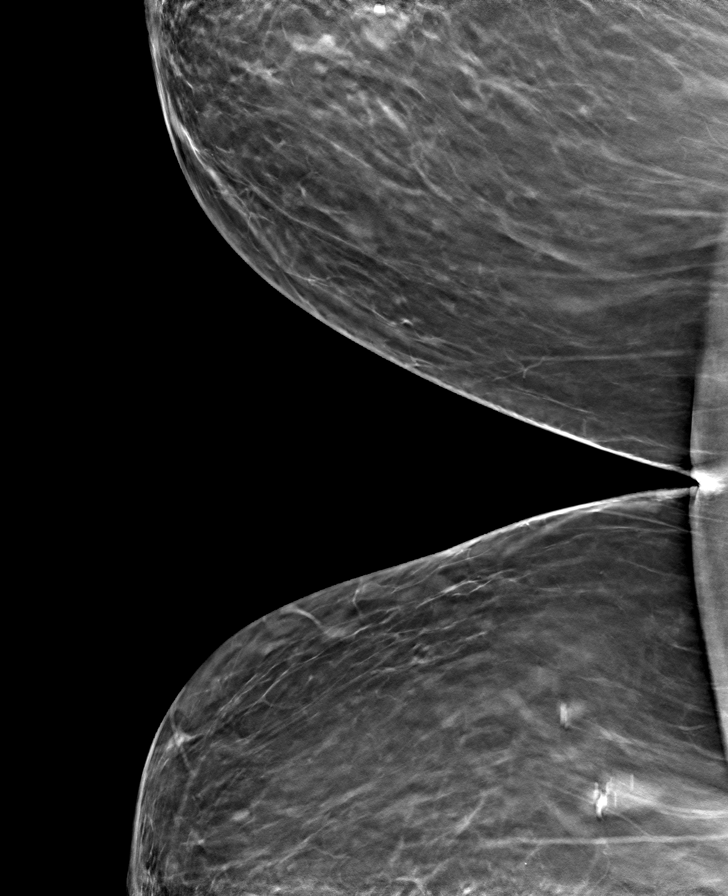

[8 of 40 positions shown; findings below may reference images not displayed]

ACR Breast Density Category b: There are scattered areas of
fibroglandular density.
FINDINGS: There are no findings suspicious for malignancy.
IMPRESSION: No mammographic evidence of malignancy. A result letter of this
screening mammogram will be mailed directly to the patient.

RECOMMENDATION:
Screening mammogram in one year. (Code:51-O-LD2)

BI-RADS CATEGORY  1: Negative.

## 2022-10-14 NOTE — Patient Instructions (Signed)
Take warfarin 1 1/2 tablets tonight then resume 1 tablet daily   Continue greens Recheck INR in 3 wks.

## 2022-10-24 ENCOUNTER — Ambulatory Visit: Payer: Medicare Other | Attending: Cardiology | Admitting: Cardiology

## 2022-10-24 ENCOUNTER — Encounter: Payer: Self-pay | Admitting: Cardiology

## 2022-10-24 ENCOUNTER — Other Ambulatory Visit (HOSPITAL_COMMUNITY): Payer: Self-pay

## 2022-10-24 VITALS — BP 142/90 | HR 89 | Ht 62.0 in | Wt 197.8 lb

## 2022-10-24 DIAGNOSIS — I5032 Chronic diastolic (congestive) heart failure: Secondary | ICD-10-CM | POA: Diagnosis not present

## 2022-10-24 DIAGNOSIS — D6869 Other thrombophilia: Secondary | ICD-10-CM | POA: Diagnosis not present

## 2022-10-24 DIAGNOSIS — I4811 Longstanding persistent atrial fibrillation: Secondary | ICD-10-CM | POA: Diagnosis not present

## 2022-10-24 DIAGNOSIS — E782 Mixed hyperlipidemia: Secondary | ICD-10-CM

## 2022-10-24 MED ORDER — ROSUVASTATIN CALCIUM 40 MG PO TABS
40.0000 mg | ORAL_TABLET | Freq: Every day | ORAL | 3 refills | Status: DC
  Filled 2022-10-24 – 2022-10-27 (×2): qty 90, 90d supply, fill #0

## 2022-10-24 NOTE — Patient Instructions (Signed)
Medication Instructions:  Your physician has recommended you make the following change in your medication:   -Stop Atorvastatin  -Start Crestor 40 mg tablets once daily- at bedtime  *If you need a refill on your cardiac medications before your next appointment, please call your pharmacy*   Lab Work: None If you have labs (blood work) drawn today and your tests are completely normal, you will receive your results only by: MyChart Message (if you have MyChart) OR A paper copy in the mail If you have any lab test that is abnormal or we need to change your treatment, we will call you to review the results.   Testing/Procedures: None   Follow-Up: At [facility], you and your health needs are our priority.  As part of our continuing mission to provide you with exceptional heart care, we have created designated Provider Care Teams.  These Care Teams include your primary Cardiologist (physician) and Advanced Practice Providers (APPs -  Physician Assistants and Nurse Practitioners) who all work together to provide you with the care you need, when you need it.  We recommend signing up for the patient portal called "MyChart".  Sign up information is provided on this After Visit Summary.  MyChart is used to connect with patients for Virtual Visits (Telemedicine).  Patients are able to view lab/test results, encounter notes, upcoming appointments, etc.  Non-urgent messages can be sent to your provider as well.   To learn more about what you can do with MyChart, go to [email or web address].    Your next appointment:   6 month(s)  Provider:   Dina Rich, MD    Other Instructions

## 2022-10-24 NOTE — Progress Notes (Signed)
Clinical Summary Samantha Vega is a 72 y.o.female seen today for follow up of the following medical problems       1. Chest pain - history of atypical chest pain - ER visit 03/19/2021 with chest pain - trop neg x 2. EKG without acute ischemic changes - CT PE no acute PE, questionable chronic PE  05/2019 echo LVEF 50-55%, apical hypokinesis 04/2021 nuclear stress: apical to basal inferior/inferolateral fixed defect suggesting prior infarct. LVEF 52% 10/2021 echo: LVEF 45-50%, apex hypokinetic, indet diastolic fxn, mild RV dysfunction  - no recent chest pains.     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   - no recent edema, no SOB/DOE.        3. Long standing persistent afib - rate control strategy w/ diltiazem and metoprolol   - previously on xarelto however cost was too much, changed to coumadin.       -no palpitaitons - no bleeding on coumadin    3. HTN   -has not taken meds yet today    4. HL   - she is on atorvastatin  08/2022 TC 160 TG 100 HDL 38 LDL 103   5. LE edema - well controlled with lasix.  Past Medical History:  Diagnosis Date   Annual physical exam 06/07/2015   Aortic atherosclerosis (HCC)    Arthritis    Baker's cyst of knee 10/04/2013   Diabetes mellitus    Hyperlipidemia    Hypertension    Metabolic syndrome X 09/04/2013   Mild carotid artery disease (HCC)    OA (osteoarthritis) of knee 05/05/2012   Left, takes tramadol on avg twice per week   Obesity    PAF (paroxysmal atrial fibrillation) (HCC)    Persistent atrial fibrillation (HCC)    Right bundle Samantha Vega block    +Palpitations.   EKG 11/2006:  NSR, RBBB, LAFB, markedly delayed R-wave progression, no change; Echocardiogram in 10/2006-suboptimal quality, normal; Event recorder-PVCs, no significant arrhythmias or symptoms ;   TIA  (transient ischemic attack) 06/02/2019   Unspecified visual loss 12/08/2008     No Known Allergies   Current Outpatient Medications  Medication Sig Dispense Refill   atorvastatin (LIPITOR) 80 MG tablet TAKE 1 TABLET BY MOUTH EVERY DAY 90 tablet 3   cholecalciferol (VITAMIN D3) 25 MCG (1000 UNIT) tablet Take 1,000 Units by mouth daily.     diltiazem (CARDIZEM CD) 120 MG 24 hr capsule TAKE 1 CAPSULE BY MOUTH EVERY DAY 90 capsule 2   furosemide (LASIX) 20 MG tablet TAKE 1 TABLET (20 MG TOTAL) BY MOUTH DAILY AS NEEDED FOR FLUID 90 tablet 1   metFORMIN (GLUCOPHAGE-XR) 500 MG 24 hr tablet TAKE 1 TABLET BY MOUTH EVERY DAY WITH BREAKFAST 90 tablet 1   metoprolol (TOPROL XL) 200 MG 24 hr tablet Take 1 tablet (200 mg total) by mouth daily. 90 tablet 3   montelukast (SINGULAIR) 10 MG tablet Take 1 tablet (10 mg total) by mouth at bedtime. 90 tablet 1   ofloxacin (OCUFLOX) 0.3 % ophthalmic solution Place 1 drop into both eyes 4 (four) times daily.     sacubitril-valsartan (ENTRESTO) 24-26 MG Take 1 tablet by mouth 2 (two) times daily. 180 tablet 3   terbinafine (LAMISIL) 250 MG tablet TAKE 1 TABLET BY MOUTH EVERY DAY (Patient not taking: Reported on 08/21/2022) 42 tablet 1  traMADol (ULTRAM) 50 MG tablet Take 1 tablet (50 mg total) by mouth every 6 (six) hours as needed. 20 tablet 0   warfarin (COUMADIN) 5 MG tablet TAKE 1 TABLET DAILY EXCEPT 1/2 TABLET ON SUNDAY AND THURSDAY OR AS DIRECTED BY COUMADIN CLINIC 100 tablet 3   No current facility-administered medications for this visit.     Past Surgical History:  Procedure Laterality Date   ABDOMINAL HYSTERECTOMY     BREAST BIOPSY Left    benign   CATARACT EXTRACTION W/PHACO Right 01/08/2016   Procedure: CATARACT EXTRACTION PHACO AND INTRAOCULAR LENS PLACEMENT (IOC);  Surgeon: Jethro Bolus, MD;  Location: [facility];  Service: Ophthalmology;  Laterality: Right;  CDE: 5.78   CATARACT EXTRACTION W/PHACO Left 02/05/2016   Procedure: CATARACT EXTRACTION  PHACO AND INTRAOCULAR LENS PLACEMENT LEFT EYE CDE=6.50;  Surgeon: Jethro Bolus, MD;  Location: [facility];  Service: Ophthalmology;  Laterality: Left;   COLONOSCOPY  2006   Dr. [name]: Normal, repeat in 2009.   COLONOSCOPY N/A 10/08/2015   Procedure: COLONOSCOPY;  Surgeon: West Bali, MD;  [facility]: [facility];  Service: Endoscopy;  Laterality: N/A;  230 - moved to 1:30, spoke with pt    COLONOSCOPY WITH PROPOFOL N/A 01/28/2021   Procedure: COLONOSCOPY WITH PROPOFOL;  Surgeon: Lanelle Bal, DO;  [facility];  Service: Endoscopy;  Laterality: N/A;  9:45am   POLYPECTOMY  10/08/2015   Procedure: POLYPECTOMY;  Surgeon: West Bali, MD;  [facility];  Service: Endoscopy;;  cecal polyp removed via cold forceps with one clip placed/ rectal polyp removed via cold forcep   POLYPECTOMY  01/28/2021   Procedure: POLYPECTOMY;  Surgeon: Lanelle Bal, DO;  [facility];  Service: Endoscopy;;   SHOULDER SURGERY  2006   Left-post-trauma   TOTAL ABDOMINAL HYSTERECTOMY W/ BILATERAL SALPINGOOPHORECTOMY       No Known Allergies    Family History  Problem Relation Age of Onset   Hypertension Mother    Arthritis Mother    Stroke Father    Hypertension Father    Coronary artery disease Father    Diabetes Sister    Sarcoidosis Sister    Diabetes Sister    Heart failure Brother    Congestive Heart Failure Brother    Colon cancer Neg Hx      Social History [name] reports that she has never smoked. She has never used smokeless tobacco. [name] reports no history of alcohol use.   Review of Systems CONSTITUTIONAL: No weight loss, fever, chills, weakness or fatigue.  HEENT: Eyes: No visual loss, blurred vision, double vision or yellow sclerae.No hearing loss, sneezing, congestion, runny nose or sore throat.  SKIN: No rash or itching.  CARDIOVASCULAR: per hpi RESPIRATORY: No shortness of breath, cough or sputum.  GASTROINTESTINAL: No anorexia,  nausea, vomiting or diarrhea. No abdominal pain or blood.  GENITOURINARY: No burning on urination, no polyuria NEUROLOGICAL: No headache, dizziness, syncope, paralysis, ataxia, numbness or tingling in the extremities. No change in bowel or bladder control.  MUSCULOSKELETAL: No muscle, back pain, joint pain or stiffness.  LYMPHATICS: No enlarged nodes. No history of splenectomy.  PSYCHIATRIC: No history of depression or anxiety.  ENDOCRINOLOGIC: No reports of sweating, cold or heat intolerance. No polyuria or polydipsia.  Marland Kitchen   Physical Examination There were no vitals filed for this visit. Filed Weights   10/24/22 0810  Weight: 197 lb 12.8 oz (89.7 kg)    Gen: resting comfortably, no acute  distress HEENT: no scleral icterus, pupils equal round and reactive, no palptable cervical adenopathy,  CV: irreg, no m/rg, no jvd Resp: Clear to auscultation bilaterally GI: abdomen is soft, non-tender, non-distended, normal bowel sounds, no hepatosplenomegaly MSK: extremities are warm, no edema.  Skin: warm, no rash Neuro:  no focal deficits Psych: appropriate affect   Diagnostic Studies  Echo: LVEF 55-60%, mild basal septal hypertrophy,     07/11/12 Carotid US: <50% bilateral stenosis, 3.4 cm thyroid nodule     05/2019 echo IMPRESSIONS       1. Left ventricular ejection fraction, by visual estimation, is 50 to  55%. The left ventricle has low normal function. There is borderline left  ventricular hypertrophy.   2. Apical lateral segment, apical anterior segment, apical inferior  segment, and apex are abnormal.   3. Left ventricular diastolic parameters are indeterminate in the setting  of atrial fibrillation.   4. The left ventricle demonstrates regional wall motion abnormalities.   5. Global right ventricle has low normal systolic function.The right  ventricular size is normal. No increase in right ventricular wall  thickness.   6. Left atrial size was normal.   7. Right atrial  size was normal.   8. Mild mitral annular calcification.   9. The mitral valve is grossly normal. Trivial mitral valve  regurgitation.  10. The tricuspid valve is grossly normal. Tricuspid valve regurgitation  is trivial.  11. The aortic valve is tricuspid. Aortic valve regurgitation is not  visualized.  12. The pulmonic valve was grossly normal. Pulmonic valve regurgitation is  trivial.  13. TR signal is inadequate for assessing pulmonary artery systolic  pressure.  14. The inferior vena cava is normal in size with greater than 50%  respiratory variability, suggesting right atrial pressure of 3 mmHg.  15. No obvious right to left interatrial shunting by saline contrast.      04/2021 nuclear stress   Findings are consistent with prior myocardial infarction. The study is intermediate risk.   No ST deviation was noted. The ECG was negative for ischemia.   LV perfusion is abnormal. Defect 1: There is a large defect with moderate reduction in uptake present in the apical to basal inferior and inferolateral location(s) that is fixed. There is abnormal wall motion in the defect area. Consistent with infarction.   Left ventricular function is normal. Nuclear stress EF: 52 %.   Intermediate risk study.  There is an apical to basal inferior/inferolateral perfusion defect that is fixed and most consistent with infarct scar although there is global hypokinesis in general with LVEF low normal at 52%.  Associated breast attenuation artifact also noted.  No large ischemic territories.   10/2021 echo 1. Left ventricular ejection fraction, by estimation, is 45 to 50%. The  left ventricle has mildly decreased function. The left ventricle  demonstrates regional wall motion abnormalities (see scoring  diagram/findings for description). The apical anterior,   apical inferior, apical lateral and apex are hypokinetic. Diastolic  function indeteminant due to Afib.   2. Right ventricular systolic function is  mildly reduced. The right  ventricular size is normal. There is normal pulmonary artery systolic  pressure.   3. Left atrial size was moderately dilated.   4. Right atrial size was mildly dilated.   5. The mitral valve is grossly normal. Trivial mitral valve  regurgitation.   6. The aortic valve is tricuspid. There is mild calcification of the  aortic valve. There is mild thickening of the aortic valve.  Aortic valve  regurgitation is trivial. Aortic valve sclerosis/calcification is present,  without any evidence of aortic  stenosis.   7. The inferior vena cava is normal in size with greater than 50%  respiratory variability, suggesting right atrial pressure of 3 mmHg.        Assessment and Plan  1. HFimpEF -no recent symptoms - continue current meds   2. Long standing persistent Afib/acquried thrombophilia - no recent symptoms, continue current meds - DOACs too expensive, she is on coumadin for stroke prevention.      3. HTN -bp is at goal, continue current meds   4. Hyperlipidemia - above goal, change atovastatin to crestor 40mg  daily.      Antoine Poche, M.D.

## 2022-10-27 ENCOUNTER — Other Ambulatory Visit (HOSPITAL_COMMUNITY): Payer: Self-pay

## 2022-10-30 ENCOUNTER — Encounter: Payer: Self-pay | Admitting: Pulmonary Disease

## 2022-10-30 ENCOUNTER — Ambulatory Visit (INDEPENDENT_AMBULATORY_CARE_PROVIDER_SITE_OTHER): Payer: Medicare Other | Admitting: Pulmonary Disease

## 2022-10-30 VITALS — BP 107/70 | HR 78 | Ht 62.0 in | Wt 200.0 lb

## 2022-10-30 DIAGNOSIS — R0683 Snoring: Secondary | ICD-10-CM

## 2022-10-30 DIAGNOSIS — I480 Paroxysmal atrial fibrillation: Secondary | ICD-10-CM

## 2022-10-30 NOTE — Assessment & Plan Note (Signed)
Correlation between OSA and atrial fibrillation was discussed 

## 2022-10-30 NOTE — Progress Notes (Signed)
Subjective:    Patient ID: Samantha Vega, female    DOB: [birth year], 72 y.o.   MRN: [id number]  HPI  Chief Complaint  Patient presents with   Consult    Pt sleep consult states that she snore but never had a sleep study. Denies witnessed apneas, morning headaches, or gasping for air    72 year old woman presents for evaluation of sleep disordered breathing. She was referred by her PCP due to new onset loud snoring and fatigue. TSH was checked which was normal   PMH :   HFimpEF - F 50-55 % on echo 05/2022 persistent afib on Coumadin Diabetes type 2  Epworth Sleepiness Scale is 8 and she reports some sleepiness in the daytime especially in the afternoons or as a passenger in the car. Bedtime can be between 9 and 10 PM, sleep latency is variable, she sleeps on her side with 1 pillow, reports 1-2 nocturnal awakenings and is out of breath between 5 and 6 AM feeling rested without dryness of mouth or headaches.  She gets drowsy as the day goes by There is no history suggestive of cataplexy, sleep paralysis or parasomnias Sleep history is corroborated by her husband who accompanies today.  He has not witnessed apneas    Past Medical History:  Diagnosis Date   Annual physical exam 06/07/2015   Aortic atherosclerosis (HCC)    Arthritis    Baker's cyst of knee 10/04/2013   Diabetes mellitus    Hyperlipidemia    Hypertension    Metabolic syndrome X 09/04/2013   Mild carotid artery disease (HCC)    OA (osteoarthritis) of knee 05/05/2012   Left, takes tramadol on avg twice per week   Obesity    PAF (paroxysmal atrial fibrillation) (HCC)    Persistent atrial fibrillation (HCC)    Right bundle branch block    +Palpitations.   EKG 11/2006:  NSR, RBBB, LAFB, markedly delayed R-wave progression, no change; Echocardiogram in 10/2006-suboptimal quality, normal; Event recorder-PVCs, no significant arrhythmias or symptoms ;   TIA (transient ischemic attack) 06/02/2019   Unspecified  visual loss 12/08/2008   Past Surgical History:  Procedure Laterality Date   ABDOMINAL HYSTERECTOMY     BREAST BIOPSY Left    benign   CATARACT EXTRACTION W/PHACO Right 01/08/2016   Procedure: CATARACT EXTRACTION PHACO AND INTRAOCULAR LENS PLACEMENT (IOC);  Surgeon: Jethro Bolus, MD;  Location: [facility];  Service: Ophthalmology;  Laterality: Right;  CDE: 5.78   CATARACT EXTRACTION W/PHACO Left 02/05/2016   Procedure: CATARACT EXTRACTION PHACO AND INTRAOCULAR LENS PLACEMENT LEFT EYE CDE=6.50;  Surgeon: Jethro Bolus, MD;  Location: [facility];  Service: Ophthalmology;  Laterality: Left;   COLONOSCOPY  2006   Dr. [name]: Normal, repeat in 2009.   COLONOSCOPY N/A 10/08/2015   Procedure: COLONOSCOPY;  Surgeon: West Bali, MD;  [facility];  Service: Endoscopy;  Laterality: N/A;  230 - moved to 1:30, spoke with pt    COLONOSCOPY WITH PROPOFOL N/A 01/28/2021   Procedure: COLONOSCOPY WITH PROPOFOL;  Surgeon: Lanelle Bal, DO;  [facility];  Service: Endoscopy;  Laterality: N/A;  9:45am   POLYPECTOMY  10/08/2015   Procedure: POLYPECTOMY;  Surgeon: West Bali, MD;  [facility];  Service: Endoscopy;;  cecal polyp removed via cold forceps with one clip placed/ rectal polyp removed via cold forcep   POLYPECTOMY  01/28/2021   Procedure: POLYPECTOMY;  Surgeon: Lanelle Bal, DO;  [facility];  Service: Endoscopy;;  SHOULDER SURGERY  2006   Left-post-trauma   TOTAL ABDOMINAL HYSTERECTOMY W/ BILATERAL SALPINGOOPHORECTOMY      No Known Allergies  Social History   Socioeconomic History   Marital status: Married    Spouse name: Not on file   Number of children: 0   Years of education: Not on file   Highest education level: Not on file  Occupational History   Occupation: Programmer, applications: UNEMPLOYED  Tobacco Use   Smoking status: Never   Smokeless tobacco: Never  Vaping Use   Vaping Use: Never used  Substance and Sexual  Activity   Alcohol use: No   Drug use: No   Sexual activity: Yes    Birth control/protection: Surgical  Other Topics Concern   Not on file  Social History Narrative   Not on file   Social Determinants of Health   Financial Resource Strain: Low Risk  (01/27/2022)   Overall Financial Resource Strain (CARDIA)    Difficulty of Paying Living Expenses: Not hard at all  Food Insecurity: No Food Insecurity (01/27/2022)   Hunger Vital Sign    Worried About Running Out of Food in the Last Year: Never true    Ran Out of Food in the Last Year: Never true  Transportation Needs: No Transportation Needs (01/27/2022)   PRAPARE - Administrator, Civil Service (Medical): No    Lack of Transportation (Non-Medical): No  Physical Activity: Inactive (01/27/2022)   Exercise Vital Sign    Days of Exercise per Week: 0 days    Minutes of Exercise per Session: 0 min  Stress: No Stress Concern Present (01/21/2021)   [employer] of Occupational Health - Occupational Stress Questionnaire    Feeling of Stress : Not at all  Social Connections: Moderately Integrated (01/27/2022)   Social Connection and Isolation Panel [NHANES]    Frequency of Communication with Friends and Family: More than three times a week    Frequency of Social Gatherings with Friends and Family: More than three times a week    Attends Religious Services: More than 4 times per year    Active Member of [employer] or Organizations: No    Attends Banker Meetings: Never    Marital Status: Married  Catering manager Violence: Not At Risk (01/21/2021)   Humiliation, Afraid, Rape, and Kick questionnaire    Fear of Current or Ex-Partner: No    Emotionally Abused: No    Physically Abused: No    Sexually Abused: No   Family History  Problem Relation Age of Onset   Hypertension Mother    Arthritis Mother    Stroke Father    Hypertension Father    Coronary artery disease Father    Diabetes Sister    Sarcoidosis Sister     Diabetes Sister    Heart failure Brother    Congestive Heart Failure Brother    Colon cancer Neg Hx      Review of Systems Constitutional: negative for anorexia, fevers and sweats  Eyes: negative for irritation, redness and visual disturbance  Ears, nose, mouth, throat, and face: negative for earaches, epistaxis, nasal congestion and sore throat  Respiratory: negative for cough, dyspnea on exertion, sputum and wheezing  Cardiovascular: negative for chest pain, dyspnea, lower extremity edema, orthopnea, palpitations and syncope  Gastrointestinal: negative for abdominal pain, constipation, diarrhea, melena, nausea and vomiting  Genitourinary:negative for dysuria, frequency and hematuria  Hematologic/lymphatic: negative for bleeding, easy bruising and lymphadenopathy  Musculoskeletal:negative for  arthralgias, muscle weakness and stiff joints  Neurological: negative for coordination problems, gait problems, headaches and weakness  Endocrine: negative for diabetic symptoms including polydipsia, polyuria and weight loss     Objective:   Physical Exam  Gen. Pleasant, obese, in no distress, normal affect ENT - no pallor,icterus, no post nasal drip, edentulous, class 2 airway Neck: No JVD, no thyromegaly, no carotid bruits Lungs: no use of accessory muscles, no dullness to percussion, decreased without rales or rhonchi  Cardiovascular: Rhythm regular, heart sounds  normal, no murmurs or gallops, no peripheral edema Abdomen: soft and non-tender, no hepatosplenomegaly, BS normal. Musculoskeletal: No deformities, no cyanosis or clubbing Neuro:  alert, non focal, no tremors       Assessment & Plan:    Snoring -  Given excessive daytime somnolence, narrow pharyngeal exam, nonrefreshing sleep & loud snoring, obstructive sleep apnea is possible & an overnight polysomnogram will be scheduled as a home study. The pathophysiology of obstructive sleep apnea , it's cardiovascular consequences &  modes of treatment including CPAP were discused with the patient in detail & they evidenced understanding.  Pretest probably is low to intermediate.  Would only treat if she has AHI more than 15 or significant desaturations Weight loss was encouraged

## 2022-10-30 NOTE — Patient Instructions (Signed)
X home sleep test °

## 2022-11-04 ENCOUNTER — Ambulatory Visit: Payer: Medicare Other | Attending: Cardiology | Admitting: *Deleted

## 2022-11-04 DIAGNOSIS — Z5181 Encounter for therapeutic drug level monitoring: Secondary | ICD-10-CM

## 2022-11-04 DIAGNOSIS — I480 Paroxysmal atrial fibrillation: Secondary | ICD-10-CM | POA: Diagnosis not present

## 2022-11-04 LAB — POCT INR: INR: 2.8 (ref 2.0–3.0)

## 2022-11-04 NOTE — Patient Instructions (Signed)
Continue warfarin 1 tablet daily   Continue greens Recheck INR in 4 wks.  

## 2022-11-07 ENCOUNTER — Other Ambulatory Visit: Payer: Self-pay | Admitting: Orthopedic Surgery

## 2022-11-07 NOTE — Telephone Encounter (Signed)
[place]  Patient spouse came in requesting refill on her medicine   traMADol (ULTRAM) 50 MG tablet   [facility] [place]

## 2022-11-11 ENCOUNTER — Telehealth: Payer: Self-pay | Admitting: Orthopedic Surgery

## 2022-11-11 NOTE — Telephone Encounter (Signed)
Dr. Dallas Schimke pt - pt presented to the office requesting a refill on Tramadol 50mg , 20 quantity, every 6 hours PRN to be sent to [facility]

## 2022-11-12 ENCOUNTER — Ambulatory Visit: Payer: Medicare Other

## 2022-11-21 ENCOUNTER — Other Ambulatory Visit: Payer: Self-pay | Admitting: Family Medicine

## 2022-11-24 ENCOUNTER — Other Ambulatory Visit: Payer: Self-pay | Admitting: Family Medicine

## 2022-12-02 ENCOUNTER — Ambulatory Visit: Payer: Medicare Other | Attending: Cardiology | Admitting: *Deleted

## 2022-12-02 DIAGNOSIS — Z5181 Encounter for therapeutic drug level monitoring: Secondary | ICD-10-CM

## 2022-12-02 DIAGNOSIS — I480 Paroxysmal atrial fibrillation: Secondary | ICD-10-CM | POA: Diagnosis not present

## 2022-12-02 LAB — POCT INR: INR: 4.4 — AB (ref 2.0–3.0)

## 2022-12-02 NOTE — Patient Instructions (Signed)
Hold warfarin today, take 1/2 tablet tomorrow then resume 1 tablet daily   Continue greens Recheck INR in 1 wk

## 2022-12-10 ENCOUNTER — Ambulatory Visit (HOSPITAL_COMMUNITY)
Admission: RE | Admit: 2022-12-10 | Discharge: 2022-12-10 | Disposition: A | Discharge status: Home / Self Care | Payer: Medicare Other | Source: Ambulatory Visit | Attending: Family Medicine | Admitting: Family Medicine

## 2022-12-10 DIAGNOSIS — Z1231 Encounter for screening mammogram for malignant neoplasm of breast: Secondary | ICD-10-CM | POA: Insufficient documentation

## 2022-12-11 ENCOUNTER — Ambulatory Visit: Payer: Medicare Other | Attending: Cardiology | Admitting: *Deleted

## 2022-12-11 DIAGNOSIS — Z5181 Encounter for therapeutic drug level monitoring: Secondary | ICD-10-CM

## 2022-12-11 DIAGNOSIS — I480 Paroxysmal atrial fibrillation: Secondary | ICD-10-CM | POA: Diagnosis not present

## 2022-12-11 LAB — POCT INR: INR: 3.9 — AB (ref 2.0–3.0)

## 2022-12-11 NOTE — Patient Instructions (Signed)
Hold warfarin today then decrease dose to 1 tablet daily except 1/2 tablet on Sundays and Wednesdays Continue greens Recheck INR in 3 wk

## 2022-12-13 ENCOUNTER — Other Ambulatory Visit: Payer: Self-pay | Admitting: Family Medicine

## 2022-12-23 ENCOUNTER — Ambulatory Visit: Payer: Medicare Other | Admitting: Family Medicine

## 2022-12-24 ENCOUNTER — Other Ambulatory Visit: Payer: Self-pay | Admitting: Orthopedic Surgery

## 2022-12-24 MED ORDER — TRAMADOL HCL 50 MG PO TABS: 50.0000 mg | ORAL_TABLET | Freq: Four times a day (QID) | ORAL | 0 refills | Status: DC | PRN

## 2022-12-24 NOTE — Telephone Encounter (Signed)
Dr. Mort Sawyers pt - pt presented to the office requesting a refill on Tramadol 50mg , 20 quantity, Every 6 hours PRN to be [facility] to [facility] [place].

## 2023-01-01 ENCOUNTER — Ambulatory Visit: Payer: Medicare Other | Attending: Cardiology | Admitting: *Deleted

## 2023-01-01 DIAGNOSIS — Z5181 Encounter for therapeutic drug level monitoring: Secondary | ICD-10-CM

## 2023-01-01 DIAGNOSIS — I480 Paroxysmal atrial fibrillation: Secondary | ICD-10-CM | POA: Diagnosis not present

## 2023-01-01 LAB — POCT INR: INR: 2.6 (ref 2.0–3.0)

## 2023-01-01 NOTE — Patient Instructions (Signed)
Continue warfarin 1 tablet daily except 1/2 tablet on Sundays and Wednesdays Continue greens Recheck INR in 4 wk

## 2023-01-12 ENCOUNTER — Ambulatory Visit: Payer: Medicare Other | Admitting: Podiatry

## 2023-01-12 DIAGNOSIS — B351 Tinea unguium: Secondary | ICD-10-CM | POA: Diagnosis not present

## 2023-01-12 DIAGNOSIS — L84 Corns and callosities: Secondary | ICD-10-CM | POA: Diagnosis not present

## 2023-01-12 DIAGNOSIS — E1151 Type 2 diabetes mellitus with diabetic peripheral angiopathy without gangrene: Secondary | ICD-10-CM | POA: Diagnosis not present

## 2023-01-12 NOTE — Progress Notes (Unsigned)
    Subjective:  Patient ID: Samantha Vega, female    DOB: 1950/09/13,  MRN: [id number]  Samantha Vega presents to clinic today for: No chief complaint on file. . Patient notes nails are thick and elongated, causing pain in shoe gear when ambulating.  She also has painful calluses near the great toe joint bilateral  PCP is Kerri Perches, MD.  No Known Allergies  Review of Systems: Negative except as noted in the HPI.  Objective:  There were no vitals filed for this visit.  Samantha Vega is a pleasant 72 y.o. female in NAD. AAO x 3.  Vascular Examination: Patient has palpable DP pulse, absent PT pulse bilateral.  Delayed capillary refill bilateral toes.  Sparse digital hair bilateral.  Proximal to distal cooling WNL bilateral.    Dermatological Examination: Interspaces are clear with no open lesions noted bilateral.  Nails are 3-23mm thick, with yellowish/brown discoloration, subungual debris and distal onycholysis x10.  There is pain with compression of nails x10.  There are hyperkeratotic lesions noted to the plantar medial aspect of the first MPJ bilateral.     Latest Ref Rng & Units 08/21/2022    9:36 AM 02/13/2022    9:03 AM  Hemoglobin A1C  Hemoglobin-A1c 4.8 - 5.6 % 6.3  6.3    Patient qualifies for at-risk foot care because of Diabetes with PVD .  Assessment/Plan: 1. Dermatophytosis of nail   2. Type II diabetes mellitus with peripheral circulatory disorder (HCC)   3. Callus of foot     Mycotic nails x10 were sharply debrided with sterile nail nippers and power debriding burr to decrease bulk and length.  Hyperkeratotic lesions x 2 were shaved with #312 blade.  Patient was very sensitive on nail and callus debridement today.  She did not tolerate this well and also did not tolerate the burr vibration well   Return in about 3 months (around 04/14/2023) for [facility].   Clerance Lav, DPM, FACFAS [facility]     [address] [place], [place] [address]                Office [phone]  Fax [phone]

## 2023-01-15 ENCOUNTER — Ambulatory Visit: Payer: Medicare Other | Admitting: Family Medicine

## 2023-01-22 ENCOUNTER — Other Ambulatory Visit: Payer: Self-pay | Admitting: Orthopedic Surgery

## 2023-01-22 NOTE — Telephone Encounter (Signed)
Dr. Dallas Schimke pt - pt's spouse presented to the office stating that the patient needs a refill on Tramadol 50mg  [facility] [place].

## 2023-01-23 MED ORDER — TRAMADOL HCL 50 MG PO TABS: 50.0000 mg | ORAL_TABLET | Freq: Four times a day (QID) | ORAL | 0 refills | Status: DC | PRN

## 2023-01-28 ENCOUNTER — Encounter: Payer: Self-pay | Admitting: Family Medicine

## 2023-01-28 ENCOUNTER — Ambulatory Visit (INDEPENDENT_AMBULATORY_CARE_PROVIDER_SITE_OTHER): Payer: Medicare Other | Admitting: Family Medicine

## 2023-01-28 VITALS — BP 101/66 | HR 89 | Ht 62.0 in | Wt 199.0 lb

## 2023-01-28 DIAGNOSIS — I10 Essential (primary) hypertension: Secondary | ICD-10-CM | POA: Diagnosis not present

## 2023-01-28 DIAGNOSIS — I480 Paroxysmal atrial fibrillation: Secondary | ICD-10-CM

## 2023-01-28 DIAGNOSIS — E785 Hyperlipidemia, unspecified: Secondary | ICD-10-CM | POA: Diagnosis not present

## 2023-01-28 DIAGNOSIS — E1159 Type 2 diabetes mellitus with other circulatory complications: Secondary | ICD-10-CM | POA: Diagnosis not present

## 2023-01-28 NOTE — Patient Instructions (Signed)
F/U 2nd week in January , call if you need me sooner  Fasting lipid, cmp and EGFr and hBa1C in next 1 week  Recommend covid vaccine when available, also need TdAP, flu vaccine will be available in the office by mid Septemebr   It is important that you exercise regularly at least 30 minutes 5 times a week. If you develop chest pain, have severe difficulty breathing, or feel very tired, stop exercising immediately and seek medical attention   Think about what you will eat, plan ahead. Choose " clean, green, fresh or frozen" over canned, processed or packaged foods which are more sugary, salty and fatty. 70 to 75% of food eaten should be vegetables and fruit. Three meals at set times with snacks allowed between meals, but they must be fruit or vegetables. Aim to eat over a 12 hour period , example 7 am to 7 pm, and STOP after  your last meal of the day. Drink water,generally about 64 ounces per day, no other drink is as healthy. Fruit juice is best enjoyed in a healthy way, by EATING the fruit.  No falls please  Thanks for choosing [facility], we consider it a privelige to serve you.

## 2023-01-29 ENCOUNTER — Ambulatory Visit: Payer: Medicare Other | Attending: Cardiology | Admitting: *Deleted

## 2023-01-29 DIAGNOSIS — I480 Paroxysmal atrial fibrillation: Secondary | ICD-10-CM

## 2023-01-29 DIAGNOSIS — Z5181 Encounter for therapeutic drug level monitoring: Secondary | ICD-10-CM

## 2023-01-29 LAB — POCT INR: INR: 3 (ref 2.0–3.0)

## 2023-01-29 NOTE — Patient Instructions (Signed)
Continue warfarin 1 tablet daily except 1/2 tablet on Sundays and Wednesdays Continue greens Recheck INR in 5 wk

## 2023-01-30 ENCOUNTER — Ambulatory Visit: Payer: Medicare Other

## 2023-02-01 ENCOUNTER — Encounter: Payer: Self-pay | Admitting: Family Medicine

## 2023-02-01 NOTE — Assessment & Plan Note (Signed)
Samantha Vega is reminded of the importance of commitment to daily physical activity for 30 minutes or more, as able and the need to limit carbohydrate intake to 30 to 60 grams per meal to help with blood sugar control.   The need to take medication as prescribed, test blood sugar as directed, and to call between visits if there is a concern that blood sugar is uncontrolled is also discussed.   Samantha Vega is reminded of the importance of daily foot exam, annual eye examination, and good blood sugar, blood pressure and cholesterol control.     Latest Ref Rng & Units 08/21/2022    9:36 AM 07/21/2022    2:16 PM 02/13/2022    9:03 AM 10/29/2021    8:27 AM 10/15/2021   11:27 AM  Diabetic Labs  HbA1c 4.8 - 5.6 % 6.3   6.3     Micro/Creat Ratio 0 - 29 mg/g creat 9       Chol 100 - 199 mg/dL 161   096     HDL >04 mg/dL 38   40     Calc LDL 0 - 99 mg/dL 540   981     Triglycerides 0 - 149 mg/dL 191   478     Creatinine 0.57 - 1.00 mg/dL 2.95  6.21  3.08  6.57  1.09       01/28/2023   11:33 AM 10/30/2022    9:24 AM 10/24/2022    8:10 AM 09/03/2022    9:47 AM 08/21/2022    8:13 AM 07/21/2022    7:02 PM 07/21/2022    6:30 PM  BP/Weight  Systolic BP 101 107 142 132 105 127 138  Diastolic BP 66 70 90 78 79 98 87  Wt. (Lbs) 199 200 197.8 196.8 196.2    BMI 36.4 kg/m2 36.58 kg/m2 36.18 kg/m2 36 kg/m2 35.89 kg/m2        Latest Ref Rng & Units 11/01/2021    8:20 AM 09/10/2021   12:00 AM  Foot/eye exam completion dates  Eye Exam No Retinopathy  No Retinopathy      Foot Form Completion  Done      This result is from an external source.  Controlled, no change in medication

## 2023-02-01 NOTE — Assessment & Plan Note (Signed)
Hyperlipidemia:Low fat diet discussed and encouraged.   Lipid Panel  Lab Results  Component Value Date   CHOL 160 08/21/2022   HDL 38 (L) 08/21/2022   LDLCALC 103 (H) 08/21/2022   TRIG 100 08/21/2022   CHOLHDL 4.2 08/21/2022     Updated lab needed at/ before next visit.

## 2023-02-01 NOTE — Assessment & Plan Note (Signed)
Controlled, no change in medication DASH diet and commitment to daily physical activity for a minimum of 30 minutes discussed and encouraged, as a part of hypertension management. The importance of attaining a healthy weight is also discussed.     01/28/2023   11:33 AM 10/30/2022    9:24 AM 10/24/2022    8:10 AM 09/03/2022    9:47 AM 08/21/2022    8:13 AM 07/21/2022    7:02 PM 07/21/2022    6:30 PM  BP/Weight  Systolic BP 101 107 142 132 105 127 138  Diastolic BP 66 70 90 78 79 98 87  Wt. (Lbs) 199 200 197.8 196.8 196.2    BMI 36.4 kg/m2 36.58 kg/m2 36.18 kg/m2 36 kg/m2 35.89 kg/m2

## 2023-02-01 NOTE — Assessment & Plan Note (Signed)
  Patient re-educated about  the importance of commitment to a  minimum of 150 minutes of exercise per week as able.  The importance of healthy food choices with portion control discussed, as well as eating regularly and within a 12 hour window most days. The need to choose "clean , green" food 50 to 75% of the time is discussed, as well as to make water the primary drink and set a goal of 64 ounces water daily.       01/28/2023   11:33 AM 10/30/2022    9:24 AM 10/24/2022    8:10 AM  Weight /BMI  Weight 199 lb 200 lb 197 lb 12.8 oz  Height 5\' 2"  (1.575 m) 5\' 2"  (1.575 m) 5\' 2"  (1.575 m)  BMI 36.4 kg/m2 36.58 kg/m2 36.18 kg/m2    Unchanged

## 2023-02-01 NOTE — Assessment & Plan Note (Signed)
Rate controlled, maintained on coumadin

## 2023-02-01 NOTE — Progress Notes (Signed)
Samantha Vega     MRN: [id number]      DOB: Jan 28, 1951  Chief Complaint  Patient presents with   Follow-up    Follow up eyes turn yellow in the sunlight    HPI Samantha Vega is here for follow up and re-evaluation of chronic medical conditions, medication management and review of any available recent lab and radiology data.  Preventive health is updated, specifically  Cancer screening and Immunization.   Questions or concerns regarding consultations or procedures which the PT has had in the interim are  addressed. The PT denies any adverse reactions to current medications since the last visit.  ROS Denies recent fever or chills. Denies sinus pressure, nasal congestion, ear pain or sore throat. Denies chest congestion, productive cough or wheezing. Denies chest pains, palpitations and leg swelling Denies abdominal pain, nausea, vomiting,diarrhea or constipation.   Denies dysuria, frequency, hesitancy or incontinence. Denies uncontrolled  joint pain, swelling and limitation in mobility. Denies headaches, seizures, numbness, or tingling. Denies depression, anxiety or insomnia. Denies skin break down or rash.   PE  BP 101/66 (BP Location: Right Arm, Patient Position: Sitting, Cuff Size: Large)   Pulse 89   Ht 5\' 2"  (1.575 m)   Wt 199 lb (90.3 kg)   SpO2 95%   BMI 36.40 kg/m   Patient alert and oriented and in no cardiopulmonary distress.  HEENT: No facial asymmetry, EOMI,     Neck supple .  Chest: Clear to auscultation bilaterally.  CVS: S1, S2 no murmurs, no S3.IrRegular rate.  ABD: Soft non tender.   Ext: No edema  MS: Adequate  though reduced ROM spine, shoulders, hips and knees.  Skin: Intact, no ulcerations or rash noted.  Psych: Good eye contact, normal affect. Memory intact not anxious or depressed appearing.  CNS: CN 2-12 intact, power,  normal throughout.no focal deficits noted.   Assessment & Plan  Essential hypertension Controlled, no change in  medication DASH diet and commitment to daily physical activity for a minimum of 30 minutes discussed and encouraged, as a part of hypertension management. The importance of attaining a healthy weight is also discussed.     01/28/2023   11:33 AM 10/30/2022    9:24 AM 10/24/2022    8:10 AM 09/03/2022    9:47 AM 08/21/2022    8:13 AM 07/21/2022    7:02 PM 07/21/2022    6:30 PM  BP/Weight  Systolic BP 101 107 142 132 105 127 138  Diastolic BP 66 70 90 78 79 98 87  Wt. (Lbs) 199 200 197.8 196.8 196.2    BMI 36.4 kg/m2 36.58 kg/m2 36.18 kg/m2 36 kg/m2 35.89 kg/m2         Hyperlipidemia LDL goal <70 Hyperlipidemia:Low fat diet discussed and encouraged.   Lipid Panel  Lab Results  Component Value Date   CHOL 160 08/21/2022   HDL 38 (L) 08/21/2022   LDLCALC 103 (H) 08/21/2022   TRIG 100 08/21/2022   CHOLHDL 4.2 08/21/2022     Updated lab needed at/ before next visit.   Type 2 diabetes mellitus with vascular disease (HCC) Samantha Vega is reminded of the importance of commitment to daily physical activity for 30 minutes or more, as able and the need to limit carbohydrate intake to 30 to 60 grams per meal to help with blood sugar control.   The need to take medication as prescribed, test blood sugar as directed, and to call between visits if there is a concern that  blood sugar is uncontrolled is also discussed.   Samantha Vega is reminded of the importance of daily foot exam, annual eye examination, and good blood sugar, blood pressure and cholesterol control.     Latest Ref Rng & Units 08/21/2022    9:36 AM 07/21/2022    2:16 PM 02/13/2022    9:03 AM 10/29/2021    8:27 AM 10/15/2021   11:27 AM  Diabetic Labs  HbA1c 4.8 - 5.6 % 6.3   6.3     Micro/Creat Ratio 0 - 29 mg/g creat 9       Chol 100 - 199 mg/dL 161   096     HDL >04 mg/dL 38   40     Calc LDL 0 - 99 mg/dL 540   981     Triglycerides 0 - 149 mg/dL 191   478     Creatinine 0.57 - 1.00 mg/dL 2.95  6.21  3.08  6.57  1.09        01/28/2023   11:33 AM 10/30/2022    9:24 AM 10/24/2022    8:10 AM 09/03/2022    9:47 AM 08/21/2022    8:13 AM 07/21/2022    7:02 PM 07/21/2022    6:30 PM  BP/Weight  Systolic BP 101 107 142 132 105 127 138  Diastolic BP 66 70 90 78 79 98 87  Wt. (Lbs) 199 200 197.8 196.8 196.2    BMI 36.4 kg/m2 36.58 kg/m2 36.18 kg/m2 36 kg/m2 35.89 kg/m2        Latest Ref Rng & Units 11/01/2021    8:20 AM 09/10/2021   12:00 AM  Foot/eye exam completion dates  Eye Exam No Retinopathy  No Retinopathy      Foot Form Completion  Done      This result is from an external source.  Controlled, no change in medication       Paroxysmal atrial fibrillation (HCC) Rate controlled, maintained on coumadin  Morbid obesity (HCC)  Patient re-educated about  the importance of commitment to a  minimum of 150 minutes of exercise per week as able.  The importance of healthy food choices with portion control discussed, as well as eating regularly and within a 12 hour window most days. The need to choose "clean , green" food 50 to 75% of the time is discussed, as well as to make water the primary drink and set a goal of 64 ounces water daily.       01/28/2023   11:33 AM 10/30/2022    9:24 AM 10/24/2022    8:10 AM  Weight /BMI  Weight 199 lb 200 lb 197 lb 12.8 oz  Height 5\' 2"  (1.575 m) 5\' 2"  (1.575 m) 5\' 2"  (1.575 m)  BMI 36.4 kg/m2 36.58 kg/m2 36.18 kg/m2    Unchanged

## 2023-02-07 ENCOUNTER — Other Ambulatory Visit (HOSPITAL_COMMUNITY): Payer: Self-pay

## 2023-02-10 ENCOUNTER — Other Ambulatory Visit: Payer: Self-pay | Admitting: Cardiology

## 2023-02-10 DIAGNOSIS — I10 Essential (primary) hypertension: Secondary | ICD-10-CM | POA: Diagnosis not present

## 2023-02-10 DIAGNOSIS — E1159 Type 2 diabetes mellitus with other circulatory complications: Secondary | ICD-10-CM | POA: Diagnosis not present

## 2023-02-10 DIAGNOSIS — E785 Hyperlipidemia, unspecified: Secondary | ICD-10-CM | POA: Diagnosis not present

## 2023-02-11 LAB — CMP14+EGFR
ALT: 19 IU/L (ref 0–32)
AST: 21 IU/L (ref 0–40)
Albumin: 4 g/dL (ref 3.8–4.8)
Alkaline Phosphatase: 108 IU/L (ref 44–121)
BUN/Creatinine Ratio: 18 (ref 12–28)
BUN: 18 mg/dL (ref 8–27)
Bilirubin Total: 0.4 mg/dL (ref 0.0–1.2)
CO2: 22 mmol/L (ref 20–29)
Calcium: 9.2 mg/dL (ref 8.7–10.3)
Chloride: 105 mmol/L (ref 96–106)
Creatinine, Ser: 1.01 mg/dL — ABNORMAL HIGH (ref 0.57–1.00)
Globulin, Total: 2.7 g/dL (ref 1.5–4.5)
Glucose: 97 mg/dL (ref 70–99)
Potassium: 3.8 mmol/L (ref 3.5–5.2)
Sodium: 141 mmol/L (ref 134–144)
Total Protein: 6.7 g/dL (ref 6.0–8.5)
eGFR: 59 mL/min/{1.73_m2} — ABNORMAL LOW (ref >59)

## 2023-02-11 LAB — LIPID PANEL
Chol/HDL Ratio: 4.3 ratio (ref 0.0–4.4)
Cholesterol, Total: 152 mg/dL (ref 100–199)
HDL: 35 mg/dL — ABNORMAL LOW (ref >39)
LDL Chol Calc (NIH): 94 mg/dL (ref 0–99)
Triglycerides: 127 mg/dL (ref 0–149)
VLDL Cholesterol Cal: 23 mg/dL (ref 5–40)

## 2023-02-11 LAB — HEMOGLOBIN A1C
Est. average glucose Bld gHb Est-mCnc: 137 mg/dL
Hgb A1c MFr Bld: 6.4 % — ABNORMAL HIGH (ref 4.8–5.6)

## 2023-02-18 ENCOUNTER — Other Ambulatory Visit: Payer: Self-pay | Admitting: Family Medicine

## 2023-02-23 ENCOUNTER — Ambulatory Visit (INDEPENDENT_AMBULATORY_CARE_PROVIDER_SITE_OTHER): Payer: Medicare Other

## 2023-02-23 DIAGNOSIS — E1159 Type 2 diabetes mellitus with other circulatory complications: Secondary | ICD-10-CM | POA: Diagnosis not present

## 2023-02-23 LAB — HM DIABETES EYE EXAM

## 2023-02-23 NOTE — Progress Notes (Signed)
Samantha Vega arrived 02/23/2023 and has given verbal consent to obtain images and complete their overdue diabetic retinal screening.  The images have been sent to an ophthalmologist or optometrist for review and interpretation.  Results will be sent back to Kerri Perches, MD for review.  Patient has been informed they will be contacted when we receive the results via telephone or MyChart   ;

## 2023-03-02 ENCOUNTER — Ambulatory Visit (INDEPENDENT_AMBULATORY_CARE_PROVIDER_SITE_OTHER): Payer: Medicare Other

## 2023-03-02 VITALS — BP 108/68 | HR 86 | Ht 62.0 in | Wt 198.1 lb

## 2023-03-02 DIAGNOSIS — Z0000 Encounter for general adult medical examination without abnormal findings: Secondary | ICD-10-CM | POA: Diagnosis not present

## 2023-03-02 NOTE — Patient Instructions (Signed)
  Ms. Koprowski , Thank you for taking time to come for your Medicare Wellness Visit. I appreciate your ongoing commitment to your health goals. Please review the following plan we discussed and let me know if I can assist you in the future.   These are the goals we discussed:  Goals      Exercise 3x per week (30 min per time)     Recommend starting a routine exercise program at least 3 days a week for 30-45 minutes at a time as tolerated.       Increase physical activity     Start going to the [facility]      Patient Stated     Stay healthy        This is a list of the screening recommended for you and due dates:  Health Maintenance  Topic Date Due   DTaP/Tdap/Td vaccine (3 - Td or Tdap) 04/21/2021   Eye exam for diabetics  09/11/2022   Flu Shot  01/15/2023   COVID-19 Vaccine (5 - 2023-24 season) 02/15/2023   Hemoglobin A1C  08/13/2023   Yearly kidney health urinalysis for diabetes  08/21/2023   Complete foot exam   08/27/2023   Yearly kidney function blood test for diabetes  02/10/2024   Medicare Annual Wellness Visit  03/01/2024   Mammogram  12/09/2024   Colon Cancer Screening  01/28/2026   Pneumonia Vaccine  Completed   DEXA scan (bone density measurement)  Completed   Hepatitis C Screening  Completed   Zoster (Shingles) Vaccine  Completed   HPV Vaccine  Aged Out

## 2023-03-02 NOTE — Progress Notes (Signed)
Subjective:   Samantha Vega is a 72 y.o. female who presents for Medicare Annual (Subsequent) preventive examination.  Visit Complete: In person  Patient Medicare AWV questionnaire was completed by the patient on 03/02/2023; I have confirmed that all information answered by patient is correct and no changes since this date.  Cardiac Risk Factors include: advanced age (>47men, >45 women);diabetes mellitus     Objective:    Today's Vitals   03/02/23 0818 03/02/23 0819  BP: 108/68   Pulse: 86   SpO2: 98%   Weight: 198 lb 1.9 oz (89.9 kg)   Height: 5\' 2"  (1.575 m)   PainSc: 0-No pain 0-No pain   Body mass index is 36.24 kg/m.     03/02/2023    8:25 AM 07/21/2022    1:57 PM 01/27/2022    7:57 AM 10/15/2021   11:05 AM 09/27/2021   10:26 AM 09/06/2021   11:36 AM 03/19/2021    2:36 PM  Advanced Directives  Does Patient Have a Medical Advance Directive? No No No No No No No  Would patient like information on creating a medical advance directive? No - Patient declined No - Patient declined Yes (ED - Information included in AVS)  No - Patient declined No - Patient declined No - Patient declined    Current Medications (verified) Outpatient Encounter Medications as of 03/02/2023  Medication Sig   cholecalciferol (VITAMIN D3) 25 MCG (1000 UNIT) tablet Take 1,000 Units by mouth daily.   diltiazem (CARDIZEM CD) 120 MG 24 hr capsule TAKE 1 CAPSULE BY MOUTH EVERY DAY   furosemide (LASIX) 20 MG tablet TAKE 1 TABLET BY MOUTH DAILY AS NEEDED FOR FLUID   metFORMIN (GLUCOPHAGE-XR) 500 MG 24 hr tablet TAKE 1 TABLET BY MOUTH EVERY DAY WITH BREAKFAST   metoprolol (TOPROL XL) 200 MG 24 hr tablet Take 1 tablet (200 mg total) by mouth daily.   montelukast (SINGULAIR) 10 MG tablet TAKE 1 TABLET BY MOUTH EVERYDAY AT BEDTIME   rosuvastatin (CRESTOR) 40 MG tablet Take 1 tablet (40 mg total) by mouth at bedtime.   sacubitril-valsartan (ENTRESTO) 24-26 MG Take 1 tablet by mouth 2 (two) times daily.    traMADol (ULTRAM) 50 MG tablet Take 1 tablet (50 mg total) by mouth every 6 (six) hours as needed.   warfarin (COUMADIN) 5 MG tablet TAKE 1 TABLET DAILY EXCEPT 1/2 TABLET ON SUNDAY AND THURSDAY OR AS DIRECTED BY COUMADIN CLINIC   No facility-administered encounter medications on file as of 03/02/2023.    Allergies (verified) Patient has no known allergies.   History: Past Medical History:  Diagnosis Date   Annual physical exam 06/07/2015   Aortic atherosclerosis (HCC)    Arthritis    Baker's cyst of knee 10/04/2013   Diabetes mellitus    Hyperlipidemia    Hypertension    Metabolic syndrome X 09/04/2013   Mild carotid artery disease (HCC)    OA (osteoarthritis) of knee 05/05/2012   Left, takes tramadol on avg twice per week   Obesity    PAF (paroxysmal atrial fibrillation) (HCC)    Persistent atrial fibrillation (HCC)    Right bundle branch block    +Palpitations.   EKG 11/2006:  NSR, RBBB, LAFB, markedly delayed R-wave progression, no change; Echocardiogram in 10/2006-suboptimal quality, normal; Event recorder-PVCs, no significant arrhythmias or symptoms ;   TIA (transient ischemic attack) 06/02/2019   Unspecified visual loss 12/08/2008   Past Surgical History:  Procedure Laterality Date   ABDOMINAL HYSTERECTOMY  BREAST BIOPSY Left    benign   CATARACT EXTRACTION W/PHACO Right 01/08/2016   Procedure: CATARACT EXTRACTION PHACO AND INTRAOCULAR LENS PLACEMENT (IOC);  Surgeon: Jethro Bolus, MD;  Location: [facility];  Service: Ophthalmology;  Laterality: Right;  CDE: 5.78   CATARACT EXTRACTION W/PHACO Left 02/05/2016   Procedure: CATARACT EXTRACTION PHACO AND INTRAOCULAR LENS PLACEMENT LEFT EYE CDE=6.50;  Surgeon: Jethro Bolus, MD;  Location: [facility];  Service: Ophthalmology;  Laterality: Left;   COLONOSCOPY  2006   Dr. [name]: Normal, repeat in 2009.   COLONOSCOPY N/A 10/08/2015   Procedure: COLONOSCOPY;  Surgeon: West Bali, MD;  [facility]: [facility];  Service: Endoscopy;   Laterality: N/A;  230 - moved to 1:30, spoke with pt    COLONOSCOPY WITH PROPOFOL N/A 01/28/2021   Procedure: COLONOSCOPY WITH PROPOFOL;  Surgeon: Lanelle Bal, DO;  [facility]: [facility];  Service: Endoscopy;  Laterality: N/A;  9:45am   POLYPECTOMY  10/08/2015   Procedure: POLYPECTOMY;  Surgeon: West Bali, MD;  [facility];  Service: Endoscopy;;  cecal polyp removed via cold forceps with one clip placed/ rectal polyp removed via cold forcep   POLYPECTOMY  01/28/2021   Procedure: POLYPECTOMY;  Surgeon: Lanelle Bal, DO;  [facility];  Service: Endoscopy;;   SHOULDER SURGERY  2006   Left-post-trauma   TOTAL ABDOMINAL HYSTERECTOMY W/ BILATERAL SALPINGOOPHORECTOMY     Family History  Problem Relation Age of Onset   Hypertension Mother    Arthritis Mother    Stroke Father    Hypertension Father    Coronary artery disease Father    Diabetes Sister    Sarcoidosis Sister    Diabetes Sister    Heart failure Brother    Congestive Heart Failure Brother    Colon cancer Neg Hx    Social History   Socioeconomic History   Marital status: Married    Spouse name: Not on file   Number of children: 0   Years of education: Not on file   Highest education level: Not on file  Occupational History   Occupation: Programmer, applications: UNEMPLOYED  Tobacco Use   Smoking status: Never   Smokeless tobacco: Never  Vaping Use   Vaping status: Never Used  Substance and Sexual Activity   Alcohol use: No   Drug use: No   Sexual activity: Yes    Birth control/protection: Surgical  Other Topics Concern   Not on file  Social History Narrative   Not on file   Social Determinants of Health   Financial Resource Strain: Low Risk  (03/02/2023)   Overall Financial Resource Strain (CARDIA)    Difficulty of Paying Living Expenses: Not hard at all  Food Insecurity: No Food Insecurity (03/02/2023)   Hunger Vital Sign    Worried About Running Out of Food in  the Last Year: Never true    Ran Out of Food in the Last Year: Never true  Transportation Needs: No Transportation Needs (03/02/2023)   PRAPARE - Administrator, Civil Service (Medical): No    Lack of Transportation (Non-Medical): No  Physical Activity: Sufficiently Active (03/02/2023)   Exercise Vital Sign    Days of Exercise per Week: 7 days    Minutes of Exercise per Session: 30 min  Stress: No Stress Concern Present (03/02/2023)   [employer] of Occupational Health - Occupational Stress Questionnaire    Feeling of Stress : Not at all  Social  Connections: Socially Integrated (03/02/2023)   Social Connection and Isolation Panel [NHANES]    Frequency of Communication with Friends and Family: More than three times a week    Frequency of Social Gatherings with Friends and Family: More than three times a week    Attends Religious Services: More than 4 times per year    Active Member of [employer] or Organizations: Yes    Attends Engineer, structural: More than 4 times per year    Marital Status: Married    Tobacco Counseling Counseling given: Not Answered   Clinical Intake:  Pre-visit preparation completed: Yes  Pain : No/denies pain Pain Score: 0-No pain     BMI - recorded: 36.39 Nutritional Status: BMI > 30  Obese Nutritional Risks: None Diabetes: Yes CBG done?: No Did pt. bring in CBG monitor from home?: No  How often do you need to have someone help you when you read instructions, pamphlets, or other written materials from your doctor or pharmacy?: 1 - Never What is the last grade level you completed in school?: 10  Interpreter Needed?: No  Information entered by :: [facility]   Activities of Daily Living    03/02/2023    8:20 AM  In your present state of health, do you have any difficulty performing the following activities:  Hearing? 0  Vision? 0  Difficulty concentrating or making decisions? 0  Walking or climbing stairs? 0  Dressing  or bathing? 0  Doing errands, shopping? 0  Preparing Food and eating ? N  Using the Toilet? N  In the past six months, have you accidently leaked urine? N  Do you have problems with loss of bowel control? N  Managing your Medications? N  Managing your Finances? N  Housekeeping or managing your Housekeeping? N    Patient Care Team: Kerri Perches, MD as PCP - General Branch, Dorothe Pea, MD as PCP - Cardiology (Cardiology) Wyline Mood Dorothe Pea, MD as Consulting Physician (Cardiology) Lanelle Bal, DO as Consulting Physician (Internal Medicine)  Indicate any recent Medical Services you may have received from other than [facility] providers in the past year (date may be approximate).     Assessment:   This is a routine wellness examination for Samantha Vega.   Hearing/Vision screen No results found.     Goals Addressed             This Visit's Progress    Patient Stated       Stay healthy      Depression Screen    03/02/2023    8:26 AM 03/02/2023    8:24 AM 01/28/2023   11:34 AM 08/21/2022    8:16 AM 03/03/2022    9:53 AM 02/13/2022    8:10 AM 11/01/2021    8:17 AM  PHQ 2/9 Scores  PHQ - 2 Score 0 0 0 0 0 0 0    Fall Risk    03/02/2023    8:25 AM 01/28/2023   11:34 AM 08/21/2022    8:15 AM 03/03/2022    9:53 AM 02/13/2022    8:09 AM  Fall Risk   Falls in the past year? 0 0 0 0 0  Number falls in past yr: 0 0 0 0 0  Injury with Fall? 0 0 0 0 0  Risk for fall due to : No Fall Risks No Fall Risks No Fall Risks No Fall Risks   Follow up Falls evaluation completed Falls evaluation completed Falls evaluation completed Falls  evaluation completed     MEDICARE RISK AT HOME: Medicare Risk at Home Any stairs in or around the home?: No If so, are there any without handrails?: No Home free of loose throw rugs in walkways, pet beds, electrical cords, etc?: Yes Adequate lighting in your home to reduce risk of falls?: Yes Life alert?: No Use of a cane, walker or w/c?: No Grab  bars in the bathroom?: No Shower chair or bench in shower?: No Elevated toilet seat or a handicapped toilet?: No  TIMED UP AND GO:  Was the test performed?  Yes  Length of time to ambulate 10 feet: 10 sec Gait slow and steady without use of assistive device    Cognitive Function:    03/02/2023    8:26 AM  MMSE - Mini Mental State Exam  Not completed: Unable to complete        03/02/2023    8:26 AM 01/27/2022    8:12 AM 01/21/2021    8:22 AM 01/19/2020    8:25 AM 01/18/2019    8:22 AM  6CIT Screen  What Year? 0 points 0 points 0 points 0 points 0 points  What month? 0 points 0 points 0 points 0 points 0 points  What time? 0 points 0 points 3 points 0 points 0 points  Count back from 20 0 points 0 points 4 points 4 points 4 points  Months in reverse 0 points 2 points 4 points 4 points 4 points  Repeat phrase 0 points 10 points 2 points 10 points 0 points  Total Score 0 points 12 points 13 points 18 points 8 points    Immunizations Immunization History  Administered Date(s) Administered   Fluad Quad(high Dose 65+) 04/04/2019, 02/21/2020, 03/08/2021, 08/21/2022   Influenza Split 04/22/2011, 04/16/2012   Influenza Whole 03/23/2007, 05/18/2009   Influenza,inj,Quad PF,6+ Mos 04/25/2013, 05/30/2014, 06/07/2015, 02/12/2016, 04/13/2017, 02/25/2018   Moderna Sars-Covid-2 Vaccination 09/02/2019, 10/05/2019, 06/01/2020, 01/04/2021   PNEUMOCOCCAL CONJUGATE-20 11/11/2021   Pneumococcal Conjugate-13 09/06/2014   Pneumococcal Polysaccharide-23 10/31/2009, 02/12/2016   Td 06/17/2003   Tdap 04/22/2011   Zoster Recombinant(Shingrix) 11/11/2021, 02/13/2022   Zoster, Live 05/18/2009    TDAP status: Due, Education has been provided regarding the importance of this vaccine. Advised may receive this vaccine at local pharmacy or Health Dept. Aware to provide a copy of the vaccination record if obtained from local pharmacy or Health Dept. Verbalized acceptance and understanding.  Flu Vaccine  status: Due, Education has been provided regarding the importance of this vaccine. Advised may receive this vaccine at local pharmacy or Health Dept. Aware to provide a copy of the vaccination record if obtained from local pharmacy or Health Dept. Verbalized acceptance and understanding.  Pneumococcal vaccine status: Up to date  Covid-19 vaccine status: Completed vaccines  Qualifies for Shingles Vaccine? Yes   Zostavax completed Yes   Shingrix Completed?: Yes  Screening Tests Health Maintenance  Topic Date Due   DTaP/Tdap/Td (3 - Td or Tdap) 04/21/2021   OPHTHALMOLOGY EXAM  09/11/2022   INFLUENZA VACCINE  01/15/2023   COVID-19 Vaccine (5 - 2023-24 season) 02/15/2023   HEMOGLOBIN A1C  08/13/2023   Diabetic kidney evaluation - Urine ACR  08/21/2023   FOOT EXAM  08/27/2023   Diabetic kidney evaluation - eGFR measurement  02/10/2024   Medicare Annual Wellness (AWV)  03/01/2024   MAMMOGRAM  12/09/2024   Colonoscopy  01/28/2026   Pneumonia Vaccine 41+ Years old  Completed   DEXA SCAN  Completed   Hepatitis C  Screening  Completed   Zoster Vaccines- Shingrix  Completed   HPV VACCINES  Aged Out    Health Maintenance  Health Maintenance Due  Topic Date Due   DTaP/Tdap/Td (3 - Td or Tdap) 04/21/2021   OPHTHALMOLOGY EXAM  09/11/2022   INFLUENZA VACCINE  01/15/2023   COVID-19 Vaccine (5 - 2023-24 season) 02/15/2023    Colorectal cancer screening: Type of screening: Colonoscopy. Completed 01/28/21. Repeat every 5 years  Mammogram status: Completed 12/10/22. Repeat every year  Bone Density status: Completed  . Results reflect: Bone density results: NORMAL. Repeat every 2 years.  Lung Cancer Screening: (Low Dose CT Chest recommended if Age 27-80 years, 20 pack-year currently smoking OR have quit w/in 15years.) does not qualify.   Lung Cancer Screening Referral: no  Additional Screening:  Hepatitis C Screening: does qualify; Completed   Vision Screening: Recommended annual  ophthalmology exams for early detection of glaucoma and other disorders of the eye. Is the patient up to date with their annual eye exam?  Yes  Who is the provider or what is the name of the office in which the patient attends annual eye exams? N/a If pt is not established with a provider, would they like to be referred to a provider to establish care? No .   Dental Screening: Recommended annual dental exams for proper oral hygiene  Diabetic Foot Exam: Diabetic Foot Exam: Completed    Community Resource Referral / Chronic Care Management: CRR required this visit?  No   CCM required this visit?  No     Plan:     I have personally reviewed and noted the following in the patient's chart:   Medical and social history Use of alcohol, tobacco or illicit drugs  Current medications and supplements including opioid prescriptions. Patient is not currently taking opioid prescriptions. Functional ability and status Nutritional status Physical activity Advanced directives List of other physicians Hospitalizations, surgeries, and ER visits in previous 12 months Vitals Screenings to include cognitive, depression, and falls Referrals and appointments  In addition, I have reviewed and discussed with patient certain preventive protocols, quality metrics, and best practice recommendations. A written personalized care plan for preventive services as well as general preventive health recommendations were provided to patient.     Jasper Riling, CMA   03/02/2023   After Visit Summary: (Pick Up) Due to this being a telephonic visit, with patients personalized plan was offered to patient and patient has requested to Pick up at office.  Nurse Notes:

## 2023-03-03 NOTE — Progress Notes (Signed)
I have reviewed and agree with the above  documentation. Milus Mallick. Lodema Hong, MD [facility] Location Provider: office Location Patient: office

## 2023-03-05 ENCOUNTER — Ambulatory Visit: Payer: Medicare Other | Attending: Cardiology

## 2023-03-05 DIAGNOSIS — Z5181 Encounter for therapeutic drug level monitoring: Secondary | ICD-10-CM | POA: Diagnosis not present

## 2023-03-05 DIAGNOSIS — I480 Paroxysmal atrial fibrillation: Secondary | ICD-10-CM

## 2023-03-05 LAB — POCT INR: INR: 3.8 — AB (ref 2.0–3.0)

## 2023-03-05 NOTE — Patient Instructions (Signed)
Description   Skip today's dosage of Warfarin, then resume same dosage of Warfarin 1 tablet daily except 1/2 tablet on Sundays and Wednesdays Continue greens Recheck INR in 3 weeks

## 2023-03-26 ENCOUNTER — Ambulatory Visit: Payer: Medicare Other

## 2023-03-27 ENCOUNTER — Encounter: Payer: Self-pay | Admitting: Family Medicine

## 2023-03-27 ENCOUNTER — Ambulatory Visit: Payer: Medicare Other | Admitting: Family Medicine

## 2023-03-27 VITALS — BP 110/75 | HR 83 | Ht 62.0 in | Wt 195.0 lb

## 2023-03-27 DIAGNOSIS — J069 Acute upper respiratory infection, unspecified: Secondary | ICD-10-CM | POA: Diagnosis not present

## 2023-03-27 MED ORDER — PREDNISONE 20 MG PO TABS
20.0000 mg | ORAL_TABLET | Freq: Two times a day (BID) | ORAL | 0 refills | Status: AC
Start: 2023-03-27 — End: 2023-04-01

## 2023-03-27 MED ORDER — BENZONATATE 200 MG PO CAPS
200.0000 mg | ORAL_CAPSULE | Freq: Two times a day (BID) | ORAL | 0 refills | Status: DC | PRN
Start: 2023-03-27 — End: 2023-09-03

## 2023-03-27 MED ORDER — AMOXICILLIN-POT CLAVULANATE 875-125 MG PO TABS
1.0000 | ORAL_TABLET | Freq: Two times a day (BID) | ORAL | 0 refills | Status: AC
Start: 2023-03-27 — End: 2023-04-03

## 2023-03-27 MED ORDER — ALBUTEROL SULFATE HFA 108 (90 BASE) MCG/ACT IN AERS
2.0000 | INHALATION_SPRAY | Freq: Four times a day (QID) | RESPIRATORY_TRACT | 2 refills | Status: DC | PRN
Start: 2023-03-27 — End: 2023-09-03

## 2023-03-27 NOTE — Assessment & Plan Note (Signed)
Prednisone 20 mg twice day x 5 days , Benzonatate 200 mg PRN, Augmentin 875-125 mg twice daily x 7 days Albuterol PRN Advise patient to rest to support your body's recovery. Stay hydrated by drinking water, tea, or broth. Using a humidifier can help soothe throat irritation and ease nasal congestion. For fever or pain, acetaminophen (Tylenol) is recommended. To relieve other symptoms, try saline nasal sprays, throat lozenges, or gargling with saltwater. Focus on eating light, healthy meals like fruits and vegetables to keep your strength up. Practice good hygiene by washing your hands frequently and covering your mouth when coughing or sneezing.Follow-up for worsening or persistent symptoms. Patient verbalizes understanding regarding plan of care and all questions answered

## 2023-03-27 NOTE — Progress Notes (Signed)
Patient Office Visit   Subjective   Patient ID: Samantha Vega, female    DOB: [birth year]  Age: 72 y.o. MRN: [id number]  CC:  Chief Complaint  Patient presents with   Cough    Patient complains of chest congestion and cough for 7 days.     HPI Samantha Vega 72 year old female, presents to the clinic for chest congestion and cough for 7 days She  has a past medical history of Annual physical exam (06/07/2015), Aortic atherosclerosis (HCC), Arthritis, Baker's cyst of knee (10/04/2013), Diabetes mellitus, Hyperlipidemia, Hypertension, Metabolic syndrome X (09/04/2013), Mild carotid artery disease (HCC), OA (osteoarthritis) of knee (05/05/2012), Obesity, PAF (paroxysmal atrial fibrillation) (HCC), Persistent atrial fibrillation (HCC), Right bundle branch block, TIA (transient ischemic attack) (06/02/2019), and Unspecified visual loss (12/08/2008).  The patient reports a green productive cough along with chills accompanied by rigors, wheezing, fatigue, headache, malaise, muscle aches, and increased sputum production. Symptoms started a few days ago and have been gradually worsening. The patient denies shortness of breath, chest pain, nausea, or vomiting. Over-the-counter analgesics, antipyretics, and antitussives have been used with minimal relief. patient has no prior history of pneumonia or bronchitis.      Outpatient Encounter Medications as of 03/27/2023  Medication Sig   albuterol (VENTOLIN HFA) 108 (90 Base) MCG/ACT inhaler Inhale 2 puffs into the lungs every 6 (six) hours as needed for wheezing or shortness of breath.   amoxicillin-clavulanate (AUGMENTIN) 875-125 MG tablet Take 1 tablet by mouth 2 (two) times daily for 7 days.   benzonatate (TESSALON) 200 MG capsule Take 1 capsule (200 mg total) by mouth 2 (two) times daily as needed for cough.   cholecalciferol (VITAMIN D3) 25 MCG (1000 UNIT) tablet Take 1,000 Units by mouth daily.   diltiazem (CARDIZEM CD) 120 MG 24 hr  capsule TAKE 1 CAPSULE BY MOUTH EVERY DAY   furosemide (LASIX) 20 MG tablet TAKE 1 TABLET BY MOUTH DAILY AS NEEDED FOR FLUID   metFORMIN (GLUCOPHAGE-XR) 500 MG 24 hr tablet TAKE 1 TABLET BY MOUTH EVERY DAY WITH BREAKFAST   metoprolol (TOPROL XL) 200 MG 24 hr tablet Take 1 tablet (200 mg total) by mouth daily.   montelukast (SINGULAIR) 10 MG tablet TAKE 1 TABLET BY MOUTH EVERYDAY AT BEDTIME   predniSONE (DELTASONE) 20 MG tablet Take 1 tablet (20 mg total) by mouth 2 (two) times daily with a meal for 5 days.   sacubitril-valsartan (ENTRESTO) 24-26 MG Take 1 tablet by mouth 2 (two) times daily.   traMADol (ULTRAM) 50 MG tablet Take 1 tablet (50 mg total) by mouth every 6 (six) hours as needed.   warfarin (COUMADIN) 5 MG tablet TAKE 1 TABLET DAILY EXCEPT 1/2 TABLET ON SUNDAY AND THURSDAY OR AS DIRECTED BY COUMADIN CLINIC   rosuvastatin (CRESTOR) 40 MG tablet Take 1 tablet (40 mg total) by mouth at bedtime.   No facility-administered encounter medications on file as of 03/27/2023.    Past Surgical History:  Procedure Laterality Date   ABDOMINAL HYSTERECTOMY     BREAST BIOPSY Left    benign   CATARACT EXTRACTION W/PHACO Right 01/08/2016   Procedure: CATARACT EXTRACTION PHACO AND INTRAOCULAR LENS PLACEMENT (IOC);  Surgeon: Jethro Bolus, MD;  Location: [facility];  Service: Ophthalmology;  Laterality: Right;  CDE: 5.78   CATARACT EXTRACTION W/PHACO Left 02/05/2016   Procedure: CATARACT EXTRACTION PHACO AND INTRAOCULAR LENS PLACEMENT LEFT EYE CDE=6.50;  Surgeon: Jethro Bolus, MD;  Location: [facility];  Service: Ophthalmology;  Laterality: Left;  COLONOSCOPY  2006   Dr. [name]: Normal, repeat in 2009.   COLONOSCOPY N/A 10/08/2015   Procedure: COLONOSCOPY;  Surgeon: West Bali, MD;  [facility];  Service: Endoscopy;  Laterality: N/A;  230 - moved to 1:30, spoke with pt    COLONOSCOPY WITH PROPOFOL N/A 01/28/2021   Procedure: COLONOSCOPY WITH PROPOFOL;  Surgeon: Lanelle Bal, DO;   [facility];  Service: Endoscopy;  Laterality: N/A;  9:45am   POLYPECTOMY  10/08/2015   Procedure: POLYPECTOMY;  Surgeon: West Bali, MD;  [facility];  Service: Endoscopy;;  cecal polyp removed via cold forceps with one clip placed/ rectal polyp removed via cold forcep   POLYPECTOMY  01/28/2021   Procedure: POLYPECTOMY;  Surgeon: Lanelle Bal, DO;  [facility];  Service: Endoscopy;;   SHOULDER SURGERY  2006   Left-post-trauma   TOTAL ABDOMINAL HYSTERECTOMY W/ BILATERAL SALPINGOOPHORECTOMY      Review of Systems  Constitutional:  Negative for chills and fever.  HENT:  Positive for congestion and sore throat.   Eyes:  Negative for blurred vision.  Respiratory:  Positive for cough, sputum production and wheezing. Negative for hemoptysis and shortness of breath.   Cardiovascular:  Negative for chest pain.  Neurological:  Negative for dizziness and headaches.      Objective    BP 110/75   Pulse 83   Ht 5\' 2"  (1.575 m)   Wt 195 lb (88.5 kg)   SpO2 97%   BMI 35.67 kg/m   Physical Exam Vitals reviewed.  Constitutional:      General: She is not in acute distress.    Appearance: Normal appearance. She is not ill-appearing, toxic-appearing or diaphoretic.  HENT:     Head: Normocephalic.     Right Ear: Tympanic membrane normal.     Left Ear: Tympanic membrane normal.     Nose: Congestion and rhinorrhea present.     Mouth/Throat:     Pharynx: Posterior oropharyngeal erythema present.  Eyes:     General:        Right eye: No discharge.        Left eye: No discharge.     Conjunctiva/sclera: Conjunctivae normal.  Cardiovascular:     Rate and Rhythm: Normal rate.     Pulses: Normal pulses.     Heart sounds: Normal heart sounds.  Pulmonary:     Effort: No respiratory distress.     Breath sounds: Wheezing and rhonchi present.  Chest:     Chest wall: Tenderness present.  Abdominal:     General: Bowel sounds are normal.     Palpations:  Abdomen is soft.  Musculoskeletal:        General: Normal range of motion.     Cervical back: Normal range of motion.  Skin:    General: Skin is warm and dry.     Capillary Refill: Capillary refill takes less than 2 seconds.  Neurological:     Mental Status: She is alert.     Coordination: Coordination normal.     Gait: Gait normal.       Assessment & Plan:  Upper respiratory tract infection, unspecified type Assessment & Plan: Prednisone 20 mg twice day x 5 days , Benzonatate 200 mg PRN, Augmentin 875-125 mg twice daily x 7 days Albuterol PRN Advise patient to rest to support your body's recovery. Stay hydrated by drinking water, tea, or broth. Using a humidifier can help soothe throat irritation and  ease nasal congestion. For fever or pain, acetaminophen (Tylenol) is recommended. To relieve other symptoms, try saline nasal sprays, throat lozenges, or gargling with saltwater. Focus on eating light, healthy meals like fruits and vegetables to keep your strength up. Practice good hygiene by washing your hands frequently and covering your mouth when coughing or sneezing.Follow-up for worsening or persistent symptoms. Patient verbalizes understanding regarding plan of care and all questions answered    Orders: -     Amoxicillin-Pot Clavulanate; Take 1 tablet by mouth 2 (two) times daily for 7 days.  Dispense: 14 tablet; Refill: 0 -     predniSONE; Take 1 tablet (20 mg total) by mouth 2 (two) times daily with a meal for 5 days.  Dispense: 10 tablet; Refill: 0 -     Benzonatate; Take 1 capsule (200 mg total) by mouth 2 (two) times daily as needed for cough.  Dispense: 20 capsule; Refill: 0 -     Albuterol Sulfate HFA; Inhale 2 puffs into the lungs every 6 (six) hours as needed for wheezing or shortness of breath.  Dispense: 8 g; Refill: 2    Return if symptoms worsen or fail to improve.   Cruzita Lederer Newman Nip, FNP

## 2023-03-27 NOTE — Patient Instructions (Signed)
        Great to see you today.  I have refilled the medication(s) we provide.    - Please take medications as prescribed. - Follow up with your primary health provider if any health concerns arises. - If symptoms worsen please contact your primary care provider and/or visit the emergency department.  

## 2023-03-30 ENCOUNTER — Ambulatory Visit: Payer: Medicare Other | Attending: Cardiology | Admitting: *Deleted

## 2023-03-30 DIAGNOSIS — I480 Paroxysmal atrial fibrillation: Secondary | ICD-10-CM | POA: Diagnosis not present

## 2023-03-30 DIAGNOSIS — Z5181 Encounter for therapeutic drug level monitoring: Secondary | ICD-10-CM

## 2023-03-30 LAB — POCT INR: INR: 2.9 (ref 2.0–3.0)

## 2023-03-30 NOTE — Patient Instructions (Signed)
Continue warfarin 1 tablet daily except 1/2 tablet on Sundays and Wednesdays Continue greens Recheck INR in 4 weeks

## 2023-04-06 ENCOUNTER — Telehealth: Payer: Self-pay | Admitting: Cardiology

## 2023-04-06 MED ORDER — ENTRESTO 24-26 MG PO TABS: 1.0000 | ORAL_TABLET | Freq: Two times a day (BID) | ORAL | 3 refills | Status: DC

## 2023-04-06 NOTE — Telephone Encounter (Signed)
Spoke to pt and verbalized prescription has been sent to [facility]. Pt voiced understanding.

## 2023-04-06 NOTE — Telephone Encounter (Signed)
Pt came in office and stated that she needs refills for her entresto. [phone]  best number to reach pt.

## 2023-04-08 ENCOUNTER — Telehealth: Payer: Self-pay | Admitting: Cardiology

## 2023-04-08 ENCOUNTER — Telehealth: Payer: Self-pay | Admitting: Dermatology

## 2023-04-08 MED ORDER — ENTRESTO 24-26 MG PO TABS: 1.0000 | ORAL_TABLET | Freq: Two times a day (BID) | ORAL | 0 refills | Status: DC

## 2023-04-08 NOTE — Telephone Encounter (Signed)
Spoke to pt who is on Entresto with PAF from [employer] [facility] and needs a refill on medication. Pt provided customer support number for [employer]. Pt also given sample for continuation of medication.

## 2023-04-08 NOTE — Telephone Encounter (Signed)
Patient hasn't received her eliquis yet from [facility] and is down to her last pill. Can someone look into this or possibly give her some samples. Thank you

## 2023-04-14 ENCOUNTER — Ambulatory Visit: Payer: Medicare Other | Admitting: Podiatry

## 2023-04-21 ENCOUNTER — Ambulatory Visit (INDEPENDENT_AMBULATORY_CARE_PROVIDER_SITE_OTHER): Payer: Medicare Other | Admitting: Podiatry

## 2023-04-21 ENCOUNTER — Encounter: Payer: Self-pay | Admitting: Podiatry

## 2023-04-21 DIAGNOSIS — E1151 Type 2 diabetes mellitus with diabetic peripheral angiopathy without gangrene: Secondary | ICD-10-CM | POA: Diagnosis not present

## 2023-04-21 DIAGNOSIS — B351 Tinea unguium: Secondary | ICD-10-CM | POA: Diagnosis not present

## 2023-04-21 DIAGNOSIS — L84 Corns and callosities: Secondary | ICD-10-CM

## 2023-04-21 NOTE — Progress Notes (Unsigned)
    Subjective:  Patient ID: Samantha Vega, female    DOB: 02-21-1951,  MRN: [id number]  Samantha Vega presents to clinic today for:  Chief Complaint  Patient presents with   Diabetes    PATIENT STATES THAT EVERYTHING HAS BEEN FINE SINCE HER LAST APPOINTMENT. NO COMPLAINS    Patient notes nails are thick and elongated, causing pain in shoe gear when ambulating.  She has painful calluses bilateral great toe joints.    PCP is Kerri Perches, MD.  Last seen 01/28/23.  Past Medical History:  Diagnosis Date   Annual physical exam 06/07/2015   Aortic atherosclerosis (HCC)    Arthritis    Baker's cyst of knee 10/04/2013   Diabetes mellitus    Hyperlipidemia    Hypertension    Metabolic syndrome X 09/04/2013   Mild carotid artery disease (HCC)    OA (osteoarthritis) of knee 05/05/2012   Left, takes tramadol on avg twice per week   Obesity    PAF (paroxysmal atrial fibrillation) (HCC)    Persistent atrial fibrillation (HCC)    Right bundle branch block    +Palpitations.   EKG 11/2006:  NSR, RBBB, LAFB, markedly delayed R-wave progression, no change; Echocardiogram in 10/2006-suboptimal quality, normal; Event recorder-PVCs, no significant arrhythmias or symptoms ;   TIA (transient ischemic attack) 06/02/2019   Unspecified visual loss 12/08/2008   No Known Allergies  Objective:  Samantha Vega is a pleasant 72 y.o. female in NAD. AAO x 3.  Vascular Examination: Patient has palpable DP pulse, absent PT pulse bilateral.  Delayed capillary refill bilateral toes.  Sparse digital hair bilateral.  Proximal to distal cooling WNL bilateral.    Dermatological Examination: Interspaces are clear with no open lesions noted bilateral.  Skin is shiny and atrophic bilateral.  Nails are 3-29mm thick, with yellowish/brown discoloration, subungual debris and distal onycholysis x10.  There is pain with compression of nails x10.  There are hyperkeratotic lesions noted plantarmedial hallux  MPJ b/l .     Latest Ref Rng & Units 02/10/2023    9:46 AM 08/21/2022    9:36 AM  Hemoglobin A1C  Hemoglobin-A1c 4.8 - 5.6 % 6.4  6.3    Patient qualifies for at-risk foot care because of diabetes with PVD .  Assessment/Plan: 1. Dermatophytosis of nail   2. Callus of foot   3. Type II diabetes mellitus with peripheral circulatory disorder (HCC)    Mycotic nails x10 were sharply debrided with sterile nail nippers and power debriding burr to decrease bulk and length.  Hyperkeratotic lesions x2 were shaved with #312 blade.  Return in about 3 months (around 07/22/2023) for [facility].   Clerance Lav, DPM, FACFAS [facility]     [address] [place], [place] [address]                Office [phone]  Fax [phone]

## 2023-04-27 ENCOUNTER — Ambulatory Visit: Payer: Medicare Other | Attending: Cardiology | Admitting: *Deleted

## 2023-04-27 DIAGNOSIS — Z5181 Encounter for therapeutic drug level monitoring: Secondary | ICD-10-CM | POA: Diagnosis not present

## 2023-04-27 DIAGNOSIS — I480 Paroxysmal atrial fibrillation: Secondary | ICD-10-CM | POA: Diagnosis not present

## 2023-04-27 LAB — POCT INR: INR: 2 (ref 2.0–3.0)

## 2023-04-27 NOTE — Patient Instructions (Signed)
Continue warfarin 1 tablet daily except 1/2 tablet on Sundays and Wednesdays Continue greens Recheck INR in 4 weeks

## 2023-04-29 ENCOUNTER — Encounter: Payer: Self-pay | Admitting: Cardiology

## 2023-04-29 ENCOUNTER — Ambulatory Visit: Payer: Medicare Other | Attending: Cardiology | Admitting: Cardiology

## 2023-04-29 VITALS — BP 124/75 | HR 80 | Ht 62.0 in | Wt 198.0 lb

## 2023-04-29 DIAGNOSIS — E782 Mixed hyperlipidemia: Secondary | ICD-10-CM

## 2023-04-29 DIAGNOSIS — I4811 Longstanding persistent atrial fibrillation: Secondary | ICD-10-CM

## 2023-04-29 DIAGNOSIS — I5032 Chronic diastolic (congestive) heart failure: Secondary | ICD-10-CM

## 2023-04-29 DIAGNOSIS — D6869 Other thrombophilia: Secondary | ICD-10-CM | POA: Diagnosis not present

## 2023-04-29 DIAGNOSIS — I10 Essential (primary) hypertension: Secondary | ICD-10-CM | POA: Diagnosis not present

## 2023-04-29 NOTE — Progress Notes (Signed)
Clinical Summary Samantha Vega is a 72 y.o.female seen today for follow up of the following medical problems       1. Chest pain - history of atypical chest pain - ER visit 03/19/2021 with chest pain - trop neg x 2. EKG without acute ischemic changes - CT PE no acute PE, questionable chronic PE  05/2019 echo LVEF 50-55%, apical hypokinesis 04/2021 nuclear stress: apical to basal inferior/inferolateral fixed defect suggesting prior infarct. LVEF 52% 10/2021 echo: LVEF 45-50%, apex hypokinetic, indet diastolic fxn, mild RV dysfunction   - no recent chest pains.        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    - no SOB/DOE, no recent edema - compliant with meds       3. Long standing persistent afib - rate control strategy w/ diltiazem and metoprolol   - previously on xarelto however cost was too much, changed to coumadin.       -no recent palpitations - no bleeding on coumadin.      3. HTN   -has not taken meds yet today     4. HL   - she is on atorvastatin   08/2022 TC 160 TG 100 HDL 38 LDL 103 01/2023 TC 152 TG 127 HDL 35 LDL 94   5. LE edema - well controlled with lasix.  Past Medical History:  Diagnosis Date   Annual physical exam 06/07/2015   Aortic atherosclerosis (HCC)    Arthritis    Baker's cyst of knee 10/04/2013   Diabetes mellitus    Hyperlipidemia    Hypertension    Metabolic syndrome X 09/04/2013   Mild carotid artery disease (HCC)    OA (osteoarthritis) of knee 05/05/2012   Left, takes tramadol on avg twice per week   Obesity    PAF (paroxysmal atrial fibrillation) (HCC)    Persistent atrial fibrillation (HCC)    Right bundle Jude Naclerio block    +Palpitations.   EKG 11/2006:  NSR, RBBB, LAFB, markedly delayed R-wave progression, no change; Echocardiogram in 10/2006-suboptimal quality, normal;  Event recorder-PVCs, no significant arrhythmias or symptoms ;   TIA (transient ischemic attack) 06/02/2019   Unspecified visual loss 12/08/2008     No Known Allergies   Current Outpatient Medications  Medication Sig Dispense Refill   albuterol (VENTOLIN HFA) 108 (90 Base) MCG/ACT inhaler Inhale 2 puffs into the lungs every 6 (six) hours as needed for wheezing or shortness of breath. 8 g 2   benzonatate (TESSALON) 200 MG capsule Take 1 capsule (200 mg total) by mouth 2 (two) times daily as needed for cough. 20 capsule 0   cholecalciferol (VITAMIN D3) 25 MCG (1000 UNIT) tablet Take 1,000 Units by mouth daily.     diltiazem (CARDIZEM CD) 120 MG 24 hr capsule TAKE 1 CAPSULE BY MOUTH EVERY DAY 90 capsule 2   furosemide (LASIX) 20 MG tablet TAKE 1 TABLET BY MOUTH DAILY AS NEEDED FOR FLUID 90 tablet 1   metFORMIN (GLUCOPHAGE-XR) 500 MG 24 hr tablet TAKE 1 TABLET BY MOUTH EVERY DAY WITH BREAKFAST 90 tablet 1   metoprolol (TOPROL XL) 200 MG 24 hr tablet Take 1 tablet (200 mg total) by mouth daily. 90 tablet 3   montelukast (SINGULAIR) 10 MG tablet TAKE 1 TABLET BY MOUTH EVERYDAY AT BEDTIME 90 tablet 1   rosuvastatin (CRESTOR) 40  MG tablet Take 1 tablet (40 mg total) by mouth at bedtime. 90 tablet 3   sacubitril-valsartan (ENTRESTO) 24-26 MG Take 1 tablet by mouth 2 (two) times daily. 180 tablet 3   traMADol (ULTRAM) 50 MG tablet Take 1 tablet (50 mg total) by mouth every 6 (six) hours as needed. 20 tablet 0   warfarin (COUMADIN) 5 MG tablet TAKE 1 TABLET DAILY EXCEPT 1/2 TABLET ON SUNDAY AND THURSDAY OR AS DIRECTED BY COUMADIN CLINIC 100 tablet 3   sacubitril-valsartan (ENTRESTO) 24-26 MG Take 1 tablet by mouth 2 (two) times daily. (Patient not taking: Reported on 04/29/2023) 28 tablet 0   No current facility-administered medications for this visit.     Past Surgical History:  Procedure Laterality Date   ABDOMINAL HYSTERECTOMY     BREAST BIOPSY Left    benign   CATARACT EXTRACTION W/PHACO  Right 01/08/2016   Procedure: CATARACT EXTRACTION PHACO AND INTRAOCULAR LENS PLACEMENT (IOC);  Surgeon: Jethro Bolus, MD;  Location: [facility];  Service: Ophthalmology;  Laterality: Right;  CDE: 5.78   CATARACT EXTRACTION W/PHACO Left 02/05/2016   Procedure: CATARACT EXTRACTION PHACO AND INTRAOCULAR LENS PLACEMENT LEFT EYE CDE=6.50;  Surgeon: Jethro Bolus, MD;  Location: [facility];  Service: Ophthalmology;  Laterality: Left;   COLONOSCOPY  2006   Dr. [name]: Normal, repeat in 2009.   COLONOSCOPY N/A 10/08/2015   Procedure: COLONOSCOPY;  Surgeon: West Bali, MD;  Location: [facility];  Service: Endoscopy;  Laterality: N/A;  230 - moved to 1:30, spoke with pt    COLONOSCOPY WITH PROPOFOL N/A 01/28/2021   Procedure: COLONOSCOPY WITH PROPOFOL;  Surgeon: Lanelle Bal, DO;  Location: [facility];  Service: Endoscopy;  Laterality: N/A;  9:45am   POLYPECTOMY  10/08/2015   Procedure: POLYPECTOMY;  Surgeon: West Bali, MD;  Location: [facility];  Service: Endoscopy;;  cecal polyp removed via cold forceps with one clip placed/ rectal polyp removed via cold forcep   POLYPECTOMY  01/28/2021   Procedure: POLYPECTOMY;  Surgeon: Lanelle Bal, DO;  Location: [facility];  Service: Endoscopy;;   SHOULDER SURGERY  2006   Left-post-trauma   TOTAL ABDOMINAL HYSTERECTOMY W/ BILATERAL SALPINGOOPHORECTOMY       No Known Allergies    Family History  Problem Relation Age of Onset   Hypertension Mother    Arthritis Mother    Stroke Father    Hypertension Father    Coronary artery disease Father    Diabetes Sister    Sarcoidosis Sister    Diabetes Sister    Heart failure Brother    Congestive Heart Failure Brother    Colon cancer Neg Hx      Social History Ms. Gales reports that she has never smoked. She has never used smokeless tobacco. Ms. Glauner reports no history of alcohol use.   Review of Systems CONSTITUTIONAL: No weight loss, fever, chills, weakness or fatigue.  HEENT:  Eyes: No visual loss, blurred vision, double vision or yellow sclerae.No hearing loss, sneezing, congestion, runny nose or sore throat.  SKIN: No rash or itching.  CARDIOVASCULAR: per hpi RESPIRATORY: No shortness of breath, cough or sputum.  GASTROINTESTINAL: No anorexia, nausea, vomiting or diarrhea. No abdominal pain or blood.  GENITOURINARY: No burning on urination, no polyuria NEUROLOGICAL: No headache, dizziness, syncope, paralysis, ataxia, numbness or tingling in the extremities. No change in bowel or bladder control.  MUSCULOSKELETAL: No muscle, back pain, joint pain or stiffness.  LYMPHATICS: No enlarged nodes. No history of  splenectomy.  PSYCHIATRIC: No history of depression or anxiety.  ENDOCRINOLOGIC: No reports of sweating, cold or heat intolerance. No polyuria or polydipsia.  Marland Kitchen   Physical Examination Today's Vitals   04/29/23 0814 04/29/23 0841  BP: 136/80 124/75  Pulse: 80   SpO2: 99%   Weight: 198 lb (89.8 kg)   Height: 5\' 2"  (1.575 m)    Body mass index is 36.21 kg/m.  Gen: resting comfortably, no acute distress HEENT: no scleral icterus, pupils equal round and reactive, no palptable cervical adenopathy,  CV: RRR, no mrg, no jvd Resp: Clear to auscultation bilaterally GI: abdomen is soft, non-tender, non-distended, normal bowel sounds, no hepatosplenomegaly MSK: extremities are warm, no edema.  Skin: warm, no rash Neuro:  no focal deficits Psych: appropriate affect   Diagnostic Studies  Echo: LVEF 55-60%, mild basal septal hypertrophy,     07/11/12 Carotid US: <50% bilateral stenosis, 3.4 cm thyroid nodule     05/2019 echo IMPRESSIONS       1. Left ventricular ejection fraction, by visual estimation, is 50 to  55%. The left ventricle has low normal function. There is borderline left  ventricular hypertrophy.   2. Apical lateral segment, apical anterior segment, apical inferior  segment, and apex are abnormal.   3. Left ventricular diastolic  parameters are indeterminate in the setting  of atrial fibrillation.   4. The left ventricle demonstrates regional wall motion abnormalities.   5. Global right ventricle has low normal systolic function.The right  ventricular size is normal. No increase in right ventricular wall  thickness.   6. Left atrial size was normal.   7. Right atrial size was normal.   8. Mild mitral annular calcification.   9. The mitral valve is grossly normal. Trivial mitral valve  regurgitation.  10. The tricuspid valve is grossly normal. Tricuspid valve regurgitation  is trivial.  11. The aortic valve is tricuspid. Aortic valve regurgitation is not  visualized.  12. The pulmonic valve was grossly normal. Pulmonic valve regurgitation is  trivial.  13. TR signal is inadequate for assessing pulmonary artery systolic  pressure.  14. The inferior vena cava is normal in size with greater than 50%  respiratory variability, suggesting right atrial pressure of 3 mmHg.  15. No obvious right to left interatrial shunting by saline contrast.      04/2021 nuclear stress   Findings are consistent with prior myocardial infarction. The study is intermediate risk.   No ST deviation was noted. The ECG was negative for ischemia.   LV perfusion is abnormal. Defect 1: There is a large defect with moderate reduction in uptake present in the apical to basal inferior and inferolateral location(s) that is fixed. There is abnormal wall motion in the defect area. Consistent with infarction.   Left ventricular function is normal. Nuclear stress EF: 52 %.   Intermediate risk study.  There is an apical to basal inferior/inferolateral perfusion defect that is fixed and most consistent with infarct scar although there is global hypokinesis in general with LVEF low normal at 52%.  Associated breast attenuation artifact also noted.  No large ischemic territories.   10/2021 echo 1. Left ventricular ejection fraction, by estimation, is 45 to  50%. The  left ventricle has mildly decreased function. The left ventricle  demonstrates regional wall motion abnormalities (see scoring  diagram/findings for description). The apical anterior,   apical inferior, apical lateral and apex are hypokinetic. Diastolic  function indeteminant due to Afib.   2. Right ventricular  systolic function is mildly reduced. The right  ventricular size is normal. There is normal pulmonary artery systolic  pressure.   3. Left atrial size was moderately dilated.   4. Right atrial size was mildly dilated.   5. The mitral valve is grossly normal. Trivial mitral valve  regurgitation.   6. The aortic valve is tricuspid. There is mild calcification of the  aortic valve. There is mild thickening of the aortic valve. Aortic valve  regurgitation is trivial. Aortic valve sclerosis/calcification is present,  without any evidence of aortic  stenosis.   7. The inferior vena cava is normal in size with greater than 50%  respiratory variability, suggesting right atrial pressure of 3 mmHg.        Assessment and Plan  1. HFimpEF -denies any symptoms, continue current meds   2. Long standing persistent Afib/acquried thrombophilia - DOACs too expensive, she is on coumadin for stroke prevention.  - no recent symptoms, continue current meds     3. HTN At goal, continue curren ttherapy   4. Hyperlipidemia - she is at goal, continue current meds        Antoine Poche, M.D.

## 2023-04-29 NOTE — Patient Instructions (Signed)

## 2023-05-25 ENCOUNTER — Ambulatory Visit: Payer: Medicare Other | Attending: Cardiology | Admitting: *Deleted

## 2023-05-25 DIAGNOSIS — I480 Paroxysmal atrial fibrillation: Secondary | ICD-10-CM | POA: Diagnosis not present

## 2023-05-25 DIAGNOSIS — Z5181 Encounter for therapeutic drug level monitoring: Secondary | ICD-10-CM | POA: Diagnosis not present

## 2023-05-25 LAB — POCT INR: INR: 2.6 (ref 2.0–3.0)

## 2023-05-25 NOTE — Patient Instructions (Signed)
Continue warfarin 1 tablet daily except 1/2 tablet on Sundays and Wednesdays Continue greens Recheck INR in 6 weeks

## 2023-06-23 ENCOUNTER — Ambulatory Visit: Payer: Self-pay | Admitting: Family Medicine

## 2023-07-06 ENCOUNTER — Ambulatory Visit: Payer: Medicare Other | Attending: Cardiology | Admitting: *Deleted

## 2023-07-06 DIAGNOSIS — I480 Paroxysmal atrial fibrillation: Secondary | ICD-10-CM | POA: Diagnosis not present

## 2023-07-06 DIAGNOSIS — Z5181 Encounter for therapeutic drug level monitoring: Secondary | ICD-10-CM

## 2023-07-06 LAB — POCT INR: INR: 1.8 — AB (ref 2.0–3.0)

## 2023-07-06 NOTE — Patient Instructions (Signed)
Take warfarin 1 1/2 tablets tonight then continue 1 tablet daily except 1/2 tablet on Sundays and Wednesdays Continue greens Recheck INR in 3 weeks

## 2023-07-07 ENCOUNTER — Ambulatory Visit: Payer: Medicare Other | Admitting: Family Medicine

## 2023-07-21 ENCOUNTER — Telehealth: Payer: Self-pay | Admitting: Cardiology

## 2023-07-21 MED ORDER — ENTRESTO 24-26 MG PO TABS: 1.0000 | ORAL_TABLET | Freq: Two times a day (BID) | ORAL | 3 refills | Status: DC

## 2023-07-21 NOTE — Telephone Encounter (Signed)
Pt came in office and stated that she needs refills of Entresto sent to her mail order pharmacy. She will be running out soon.

## 2023-07-21 NOTE — Telephone Encounter (Signed)
Medication refill sent to pt's mail order pharmacy- 07/21/23 [facility]

## 2023-07-27 ENCOUNTER — Encounter: Payer: Medicare Other | Admitting: Podiatry

## 2023-07-27 NOTE — Progress Notes (Signed)
 Patient did not show for scheduled 8:15am appt today

## 2023-07-28 ENCOUNTER — Ambulatory Visit: Payer: Medicare Other | Attending: Cardiology | Admitting: *Deleted

## 2023-07-28 DIAGNOSIS — Z5181 Encounter for therapeutic drug level monitoring: Secondary | ICD-10-CM

## 2023-07-28 DIAGNOSIS — I480 Paroxysmal atrial fibrillation: Secondary | ICD-10-CM | POA: Diagnosis not present

## 2023-07-28 LAB — POCT INR: INR: 2.1 (ref 2.0–3.0)

## 2023-07-28 NOTE — Patient Instructions (Signed)
Continue 1 tablet daily except 1/2 tablet on Sundays and Wednesdays Continue greens Recheck INR in 4 weeks

## 2023-08-03 ENCOUNTER — Telehealth: Payer: Self-pay | Admitting: Pharmacy Technician

## 2023-08-03 ENCOUNTER — Other Ambulatory Visit (HOSPITAL_COMMUNITY): Payer: Self-pay

## 2023-08-03 MED ORDER — SACUBITRIL-VALSARTAN 24-26 MG PO TABS: 1.0000 | ORAL_TABLET | Freq: Two times a day (BID) | ORAL | 0 refills | Status: DC

## 2023-08-03 NOTE — Telephone Encounter (Signed)
Patient Advocate Encounter   The patient was approved for a [employer] grant that will help cover the cost of entresto Total amount awarded, 10000.00.  Effective: 07/04/23 - 07/02/24   [id number] [id number]   Pharmacy provided with approval and processing information. Patient informed via phone

## 2023-08-03 NOTE — Addendum Note (Signed)
Addended by: Roseanne Reno on: 08/03/2023 03:03 PM   Modules accepted: Orders

## 2023-08-03 NOTE — Telephone Encounter (Signed)
Pt came into office this morning and stated that she did not get medication from mail order pharmacy. Informed pt that medication was sent and receipt confirmation was in chart on 2/4. Pt stated that she will call [facility] and confirm that medication will be delivered.   Pt walked back into office this afternoon and stated that medication would cost her $500 to get.  Please advise.

## 2023-08-03 NOTE — Telephone Encounter (Signed)
Samples provided to patient from [facility].

## 2023-08-03 NOTE — Telephone Encounter (Signed)
Pt came in office today and stated that she has not received her RX. She was given a number to call by Morrie Sheldon CMA and came back into the office stating she was told she has to pay $500 to get her medicine.

## 2023-08-06 ENCOUNTER — Other Ambulatory Visit: Payer: Self-pay | Admitting: Family Medicine

## 2023-08-07 ENCOUNTER — Other Ambulatory Visit: Payer: Self-pay | Admitting: Family Medicine

## 2023-08-13 ENCOUNTER — Other Ambulatory Visit: Payer: Self-pay | Admitting: *Deleted

## 2023-08-13 MED ORDER — SACUBITRIL-VALSARTAN 24-26 MG PO TABS: 1.0000 | ORAL_TABLET | Freq: Two times a day (BID) | ORAL | 3 refills | Status: DC

## 2023-08-25 ENCOUNTER — Ambulatory Visit: Payer: Medicare Other | Attending: Cardiology | Admitting: *Deleted

## 2023-08-25 DIAGNOSIS — Z5181 Encounter for therapeutic drug level monitoring: Secondary | ICD-10-CM

## 2023-08-25 DIAGNOSIS — I480 Paroxysmal atrial fibrillation: Secondary | ICD-10-CM | POA: Diagnosis not present

## 2023-08-25 LAB — POCT INR: INR: 2.6 (ref 2.0–3.0)

## 2023-08-25 NOTE — Patient Instructions (Signed)
 Continue 1 tablet daily except 1/2 tablet on Sundays and Wednesdays Continue greens Recheck INR in 6 weeks

## 2023-08-26 ENCOUNTER — Other Ambulatory Visit: Payer: Self-pay | Admitting: Student

## 2023-09-03 ENCOUNTER — Encounter: Payer: Self-pay | Admitting: Family Medicine

## 2023-09-03 ENCOUNTER — Other Ambulatory Visit (HOSPITAL_COMMUNITY): Payer: Self-pay | Admitting: Family Medicine

## 2023-09-03 ENCOUNTER — Ambulatory Visit (INDEPENDENT_AMBULATORY_CARE_PROVIDER_SITE_OTHER): Payer: Medicare Other | Admitting: Family Medicine

## 2023-09-03 VITALS — BP 100/70 | HR 89 | Ht 62.0 in | Wt 191.1 lb

## 2023-09-03 DIAGNOSIS — Z0001 Encounter for general adult medical examination with abnormal findings: Secondary | ICD-10-CM

## 2023-09-03 DIAGNOSIS — Z1231 Encounter for screening mammogram for malignant neoplasm of breast: Secondary | ICD-10-CM

## 2023-09-03 DIAGNOSIS — E559 Vitamin D deficiency, unspecified: Secondary | ICD-10-CM | POA: Diagnosis not present

## 2023-09-03 DIAGNOSIS — E785 Hyperlipidemia, unspecified: Secondary | ICD-10-CM | POA: Diagnosis not present

## 2023-09-03 DIAGNOSIS — I10 Essential (primary) hypertension: Secondary | ICD-10-CM

## 2023-09-03 DIAGNOSIS — E1159 Type 2 diabetes mellitus with other circulatory complications: Secondary | ICD-10-CM | POA: Diagnosis not present

## 2023-09-03 DIAGNOSIS — R7989 Other specified abnormal findings of blood chemistry: Secondary | ICD-10-CM | POA: Diagnosis not present

## 2023-09-03 DIAGNOSIS — B351 Tinea unguium: Secondary | ICD-10-CM | POA: Diagnosis not present

## 2023-09-03 DIAGNOSIS — Z23 Encounter for immunization: Secondary | ICD-10-CM | POA: Diagnosis not present

## 2023-09-03 MED ORDER — TERBINAFINE HCL 250 MG PO TABS: 250.0000 mg | ORAL_TABLET | Freq: Every day | ORAL | 1 refills | Status: DC

## 2023-09-03 NOTE — Assessment & Plan Note (Signed)
 Controlled, no change in medication

## 2023-09-03 NOTE — Patient Instructions (Addendum)
 F/u in 6 months, call if you need me sooner  Labs today  Please schedule mammogram at checkout  It is important that you exercise regularly at least 30 minutes 5 times a week. If you develop chest pain, have severe difficulty breathing, or feel very tired, stop exercising immediately and seek medical attention    Keep up great work  Flu vaccine today  3 months of terbinafine is prescribed for fungal infection  Thanks for choosing [facility], we consider it a privelige to serve you.

## 2023-09-03 NOTE — Assessment & Plan Note (Signed)
 bilateral tineA PEDIS AND ONYCHOMYCOSI, TERBINAFINE X 12 WEEKS

## 2023-09-03 NOTE — Assessment & Plan Note (Signed)

## 2023-09-03 NOTE — Assessment & Plan Note (Signed)
 After obtaining informed consent, the influenza vaccine is  administered , with no adverse effect noted at the time of administration.

## 2023-09-03 NOTE — Assessment & Plan Note (Signed)
 Hyperlipidemia:Low fat diet discussed and encouraged.   Lipid Panel  Lab Results  Component Value Date   CHOL 152 02/10/2023   HDL 35 (L) 02/10/2023   LDLCALC 94 02/10/2023   TRIG 127 02/10/2023   CHOLHDL 4.3 02/10/2023     Updated lab needed at/ before next visit.

## 2023-09-03 NOTE — Progress Notes (Signed)
 Samantha Vega     MRN: [id number]      DOB: 1950-09-16  Chief Complaint  Patient presents with   Annual Exam    HPI: Patient is in for annual physical exam. Immunization is reviewed , and  updated if needed.   PE: BP 100/70   Pulse 89   Ht 5\' 2"  (1.575 m)   Wt 191 lb 1.9 oz (86.7 kg)   SpO2 96%   BMI 34.96 kg/m   Pleasant  female, alert and oriented x 3, in no cardio-pulmonary distress. Afebrile. HEENT No facial trauma or asymetry. Sinuses non tender.  Extra occullar muscles intact.. External ears normal, . Neck: supple, no adenopathy,JVD or thyromegaly.No bruits.  Chest: Clear to ascultation bilaterally.No crackles or wheezes. Non tender to palpation  Cardiovascular system; Heart sounds normal,  S1 and  S2 ,no S3.  No murmur, or thrill. Apical beat not displaced Peripheral pulses normal.  Abdomen: Soft, non tender, no organomegaly or masses. No bruits. Bowel sounds normal. No guarding, tenderness or rebound.    Musculoskeletal exam: Decreased though adequate  ROM of spine, hips , shoulders and knees. No deformity ,swelling or crepitus noted. No muscle wasting or atrophy.   Neurologic: Cranial nerves 2 to 12 intact. Power, tone ,sensation and reflexes normal throughout.  disturbance in gait. No tremor.  Skin: Intact, no ulceration, erythema , scaling or rash noted. Pigmentation normal throughout  Psych; Normal mood and affect. Judgement and concentration normal   Assessment & Plan:  Annual visit for general adult medical examination with abnormal findings Annual exam as documented. Counseling done  re healthy lifestyle involving commitment to 150 minutes exercise per week, heart healthy diet, and attaining healthy weight.The importance of adequate sleep also discussed. Regular seat belt use and home safety, is also discussed. Changes in health habits are decided on by the patient with goals and time frames  set for achieving  them. Immunization and cancer screening needs are specifically addressed at this visit.   Essential hypertension Controlled, no change in medication   Morbid obesity (HCC)  Patient re-educated about  the importance of commitment to a  minimum of 150 minutes of exercise per week as able.  The importance of healthy food choices with portion control discussed, as well as eating regularly and within a 12 hour window most days. The need to choose "clean , green" food 50 to 75% of the time is discussed, as well as to make water the primary drink and set a goal of 64 ounces water daily.       09/03/2023    8:31 AM 04/29/2023    8:14 AM 03/27/2023    2:18 PM  Weight /BMI  Weight 191 lb 1.9 oz 198 lb 195 lb  Height 5\' 2"  (1.575 m) 5\' 2"  (1.575 m) 5\' 2"  (1.575 m)  BMI 34.96 kg/m2 36.21 kg/m2 35.67 kg/m2      Hyperlipidemia LDL goal <70 Hyperlipidemia:Low fat diet discussed and encouraged.   Lipid Panel  Lab Results  Component Value Date   CHOL 152 02/10/2023   HDL 35 (L) 02/10/2023   LDLCALC 94 02/10/2023   TRIG 127 02/10/2023   CHOLHDL 4.3 02/10/2023     Updated lab needed at/ before next visit.   Type 2 diabetes mellitus with vascular disease (HCC) Controlled, no change in medication Diabetes associated with hypertension, hyperlipidemia, and obesity  Ms. Gullickson is reminded of the importance of commitment to daily physical activity for 30 minutes or more,  as able and the need to limit carbohydrate intake to 30 to 60 grams per meal to help with blood sugar control.   The need to take medication as prescribed, test blood sugar as directed, and to call between visits if there is a concern that blood sugar is uncontrolled is also discussed.   Ms. Vanbuskirk is reminded of the importance of daily foot exam, annual eye examination, and good blood sugar, blood pressure and cholesterol control.     Latest Ref Rng & Units 02/10/2023    9:46 AM 08/21/2022    9:36 AM 07/21/2022     2:16 PM 02/13/2022    9:03 AM 10/29/2021    8:27 AM  Diabetic Labs  HbA1c 4.8 - 5.6 % 6.4  6.3   6.3    Micro/Creat Ratio 0 - 29 mg/g creat  9      Chol 100 - 199 mg/dL 086  578   469    HDL >62 mg/dL 35  38   40    Calc LDL 0 - 99 mg/dL 94  952   841    Triglycerides 0 - 149 mg/dL 324  401   027    Creatinine 0.57 - 1.00 mg/dL 2.53  6.64  4.03  4.74  0.85       09/03/2023    8:31 AM 04/29/2023    8:41 AM 04/29/2023    8:14 AM 03/27/2023    2:18 PM 03/02/2023    8:18 AM 01/28/2023   11:33 AM 10/30/2022    9:24 AM  BP/Weight  Systolic BP 100 124 136 110 108 101 107  Diastolic BP 70 75 80 75 68 66 70  Wt. (Lbs) 191.12  198 195 198.12 199 200  BMI 34.96 kg/m2  36.21 kg/m2 35.67 kg/m2 36.24 kg/m2 36.4 kg/m2 36.58 kg/m2      02/23/2023    9:39 AM 11/01/2021    8:20 AM  Foot/eye exam completion dates  Eye Exam --       Foot Form Completion  Done     This result is from an external source.        Low vitamin D level Updated lab needed at/ before next visit.   Onychomycosis bilateral tineA PEDIS AND ONYCHOMYCOSI, TERBINAFINE X 12 WEEKS

## 2023-09-03 NOTE — Assessment & Plan Note (Signed)
  Patient re-educated about  the importance of commitment to a  minimum of 150 minutes of exercise per week as able.  The importance of healthy food choices with portion control discussed, as well as eating regularly and within a 12 hour window most days. The need to choose "clean , green" food 50 to 75% of the time is discussed, as well as to make water the primary drink and set a goal of 64 ounces water daily.       09/03/2023    8:31 AM 04/29/2023    8:14 AM 03/27/2023    2:18 PM  Weight /BMI  Weight 191 lb 1.9 oz 198 lb 195 lb  Height 5\' 2"  (1.575 m) 5\' 2"  (1.575 m) 5\' 2"  (1.575 m)  BMI 34.96 kg/m2 36.21 kg/m2 35.67 kg/m2

## 2023-09-03 NOTE — Assessment & Plan Note (Signed)
 Controlled, no change in medication Diabetes associated with hypertension, hyperlipidemia, and obesity  Samantha Vega is reminded of the importance of commitment to daily physical activity for 30 minutes or more, as able and the need to limit carbohydrate intake to 30 to 60 grams per meal to help with blood sugar control.   The need to take medication as prescribed, test blood sugar as directed, and to call between visits if there is a concern that blood sugar is uncontrolled is also discussed.   Samantha Vega is reminded of the importance of daily foot exam, annual eye examination, and good blood sugar, blood pressure and cholesterol control.     Latest Ref Rng & Units 02/10/2023    9:46 AM 08/21/2022    9:36 AM 07/21/2022    2:16 PM 02/13/2022    9:03 AM 10/29/2021    8:27 AM  Diabetic Labs  HbA1c 4.8 - 5.6 % 6.4  6.3   6.3    Micro/Creat Ratio 0 - 29 mg/g creat  9      Chol 100 - 199 mg/dL 161  096   045    HDL >40 mg/dL 35  38   40    Calc LDL 0 - 99 mg/dL 94  981   191    Triglycerides 0 - 149 mg/dL 478  295   621    Creatinine 0.57 - 1.00 mg/dL 3.08  6.57  8.46  9.62  0.85       09/03/2023    8:31 AM 04/29/2023    8:41 AM 04/29/2023    8:14 AM 03/27/2023    2:18 PM 03/02/2023    8:18 AM 01/28/2023   11:33 AM 10/30/2022    9:24 AM  BP/Weight  Systolic BP 100 124 136 110 108 101 107  Diastolic BP 70 75 80 75 68 66 70  Wt. (Lbs) 191.12  198 195 198.12 199 200  BMI 34.96 kg/m2  36.21 kg/m2 35.67 kg/m2 36.24 kg/m2 36.4 kg/m2 36.58 kg/m2      02/23/2023    9:39 AM 11/01/2021    8:20 AM  Foot/eye exam completion dates  Eye Exam --       Foot Form Completion  Done     This result is from an external source.

## 2023-09-03 NOTE — Assessment & Plan Note (Signed)
 Updated lab needed at/ before next visit.

## 2023-09-04 ENCOUNTER — Encounter: Payer: Self-pay | Admitting: Family Medicine

## 2023-09-04 LAB — CMP14+EGFR
ALT: 15 IU/L (ref 0–32)
AST: 19 IU/L (ref 0–40)
Albumin: 4 g/dL (ref 3.8–4.8)
Alkaline Phosphatase: 109 IU/L (ref 44–121)
BUN/Creatinine Ratio: 16 (ref 12–28)
BUN: 18 mg/dL (ref 8–27)
Bilirubin Total: 0.2 mg/dL (ref 0.0–1.2)
CO2: 26 mmol/L (ref 20–29)
Calcium: 9.5 mg/dL (ref 8.7–10.3)
Chloride: 103 mmol/L (ref 96–106)
Creatinine, Ser: 1.15 mg/dL — ABNORMAL HIGH (ref 0.57–1.00)
Globulin, Total: 2.9 g/dL (ref 1.5–4.5)
Glucose: 93 mg/dL (ref 70–99)
Potassium: 4 mmol/L (ref 3.5–5.2)
Sodium: 143 mmol/L (ref 134–144)
Total Protein: 6.9 g/dL (ref 6.0–8.5)
eGFR: 50 mL/min/{1.73_m2} — ABNORMAL LOW (ref >59)

## 2023-09-04 LAB — TSH: TSH: 1.76 u[IU]/mL (ref 0.450–4.500)

## 2023-09-04 LAB — HEMOGLOBIN A1C
Est. average glucose Bld gHb Est-mCnc: 143 mg/dL
Hgb A1c MFr Bld: 6.6 % — ABNORMAL HIGH (ref 4.8–5.6)

## 2023-09-04 LAB — LIPID PANEL W/O CHOL/HDL RATIO
Cholesterol, Total: 148 mg/dL (ref 100–199)
HDL: 38 mg/dL — ABNORMAL LOW (ref >39)
LDL Chol Calc (NIH): 92 mg/dL (ref 0–99)
Triglycerides: 94 mg/dL (ref 0–149)
VLDL Cholesterol Cal: 18 mg/dL (ref 5–40)

## 2023-09-04 LAB — VITAMIN D 25 HYDROXY (VIT D DEFICIENCY, FRACTURES): Vit D, 25-Hydroxy: 35.4 ng/mL (ref 30.0–100.0)

## 2023-09-06 ENCOUNTER — Other Ambulatory Visit: Payer: Self-pay | Admitting: Cardiology

## 2023-09-16 ENCOUNTER — Ambulatory Visit (HOSPITAL_COMMUNITY)

## 2023-09-21 ENCOUNTER — Other Ambulatory Visit: Payer: Self-pay | Admitting: Family Medicine

## 2023-09-28 ENCOUNTER — Ambulatory Visit (INDEPENDENT_AMBULATORY_CARE_PROVIDER_SITE_OTHER): Admitting: Podiatry

## 2023-09-28 ENCOUNTER — Encounter: Payer: Self-pay | Admitting: Podiatry

## 2023-09-28 VITALS — Ht 62.0 in | Wt 191.0 lb

## 2023-09-28 DIAGNOSIS — M79674 Pain in right toe(s): Secondary | ICD-10-CM

## 2023-09-28 DIAGNOSIS — M79675 Pain in left toe(s): Secondary | ICD-10-CM | POA: Diagnosis not present

## 2023-09-28 DIAGNOSIS — B351 Tinea unguium: Secondary | ICD-10-CM | POA: Diagnosis not present

## 2023-09-28 NOTE — Progress Notes (Signed)
 Subjective:  Patient ID: Samantha Vega, female    DOB: July 02, 1950,  MRN: [id number]   Samantha Vega presents to clinic today for:  Chief Complaint  Patient presents with   [facility]    " My nails were trimmed last time too close, last A1C 6.6, and pcp is dr Lodema Hong"   Patient notes nails are thick, discolored, elongated and painful in shoegear when trying to ambulate.  Her female companion is with her today.  Patient is requesting her nail trimming has been pushed out to 4 months instead of 3 months.  She notes that her nails were trimmed too short last time and they were sensitive.  She would like them to be left slightly longer.  PCP is Kerri Perches, MD.  Past Medical History:  Diagnosis Date   Annual physical exam 06/07/2015   Aortic atherosclerosis (HCC)    Arthritis    Baker's cyst of knee 10/04/2013   Diabetes mellitus    Hyperlipidemia    Hypertension    Metabolic syndrome X 09/04/2013   Mild carotid artery disease (HCC)    OA (osteoarthritis) of knee 05/05/2012   Left, takes tramadol on avg twice per week   Obesity    PAF (paroxysmal atrial fibrillation) (HCC)    Persistent atrial fibrillation (HCC)    Right bundle branch block    +Palpitations.   EKG 11/2006:  NSR, RBBB, LAFB, markedly delayed R-wave progression, no change; Echocardiogram in 10/2006-suboptimal quality, normal; Event recorder-PVCs, no significant arrhythmias or symptoms ;   TIA (transient ischemic attack) 06/02/2019   Unspecified visual loss 12/08/2008    Past Surgical History:  Procedure Laterality Date   ABDOMINAL HYSTERECTOMY     BREAST BIOPSY Left    benign   CATARACT EXTRACTION W/PHACO Right 01/08/2016   Procedure: CATARACT EXTRACTION PHACO AND INTRAOCULAR LENS PLACEMENT (IOC);  Surgeon: Jethro Bolus, MD;  Location: [facility];  Service: Ophthalmology;  Laterality: Right;  CDE: 5.78   CATARACT EXTRACTION W/PHACO Left 02/05/2016   Procedure: CATARACT EXTRACTION PHACO AND INTRAOCULAR LENS  PLACEMENT LEFT EYE CDE=6.50;  Surgeon: Jethro Bolus, MD;  Location: [facility];  Service: Ophthalmology;  Laterality: Left;   COLONOSCOPY  2006   Dr. [name]: Normal, repeat in 2009.   COLONOSCOPY N/A 10/08/2015   Procedure: COLONOSCOPY;  Surgeon: West Bali, MD;  [facility]: [facility];  Service: Endoscopy;  Laterality: N/A;  230 - moved to 1:30, spoke with pt    COLONOSCOPY WITH PROPOFOL N/A 01/28/2021   Procedure: COLONOSCOPY WITH PROPOFOL;  Surgeon: Lanelle Bal, DO;  [facility]: [facility];  Service: Endoscopy;  Laterality: N/A;  9:45am   POLYPECTOMY  10/08/2015   Procedure: POLYPECTOMY;  Surgeon: West Bali, MD;  [facility];  Service: Endoscopy;;  cecal polyp removed via cold forceps with one clip placed/ rectal polyp removed via cold forcep   POLYPECTOMY  01/28/2021   Procedure: POLYPECTOMY;  Surgeon: Lanelle Bal, DO;  [facility]: [facility];  Service: Endoscopy;;   SHOULDER SURGERY  2006   Left-post-trauma   TOTAL ABDOMINAL HYSTERECTOMY W/ BILATERAL SALPINGOOPHORECTOMY      No Known Allergies  Review of Systems: Negative except as noted in the HPI.  Objective:  Samantha Vega is a pleasant 73 y.o. female in NAD. AAO x 3.  Vascular Examination: Capillary refill time is 3-5 seconds to toes bilateral. Palpable pedal pulses b/l LE. Digital hair present b/l.  Skin temperature gradient WNL  b/l. No varicosities b/l. No cyanosis noted b/l.   Dermatological Examination: Pedal skin with normal turgor, texture and tone b/l. No open wounds. No interdigital macerations b/l. Toenails x10 are 3mm thick, discolored, dystrophic with subungual debris. There is pain with compression of the nail plates.  They are elongated x10.  The left third toenail recently fell off and the area is dry and stable.  There is some evidence of nail regrowth.     Latest Ref Rng & Units 09/03/2023    9:35 AM 02/10/2023    9:46 AM  Hemoglobin A1C  Hemoglobin-A1c 4.8 - 5.6 % 6.6  6.4     Assessment/Plan: 1. Pain due to onychomycosis of toenails of both feet    The mycotic toenails were sharply debrided x10 with sterile nail nippers and a power debriding burr to decrease bulk/thickness and length.  They were left slightly longer as per patient's request.  Return in about 4 months (around 01/28/2024) for [facility].   Joe Murders, DPM, FACFAS [facility]     [address] [place], [place] [address]                Office [phone]  Fax [phone]

## 2023-10-05 ENCOUNTER — Other Ambulatory Visit: Payer: Self-pay | Admitting: Family Medicine

## 2023-10-06 ENCOUNTER — Ambulatory Visit: Attending: Cardiology | Admitting: *Deleted

## 2023-10-06 ENCOUNTER — Ambulatory Visit

## 2023-10-06 VITALS — BP 116/81 | HR 83 | Ht 62.0 in | Wt 191.0 lb

## 2023-10-06 DIAGNOSIS — R2681 Unsteadiness on feet: Secondary | ICD-10-CM | POA: Diagnosis not present

## 2023-10-06 DIAGNOSIS — Z5181 Encounter for therapeutic drug level monitoring: Secondary | ICD-10-CM

## 2023-10-06 DIAGNOSIS — I480 Paroxysmal atrial fibrillation: Secondary | ICD-10-CM | POA: Diagnosis not present

## 2023-10-06 LAB — POCT INR: INR: 2.3 (ref 2.0–3.0)

## 2023-10-06 NOTE — Patient Instructions (Signed)
 Continue 1 tablet daily except 1/2 tablet on Sundays and Wednesdays Continue greens Recheck INR in 6 weeks

## 2023-10-06 NOTE — Patient Instructions (Signed)
 F/U as needed if symptoms are not improving.

## 2023-10-06 NOTE — Progress Notes (Signed)
   Established Patient Office Visit  Subjective   Patient ID: Samantha Vega, female    DOB: [birth year]  Age: 73 y.o. MRN: [id number]  Chief Complaint  Patient presents with   Medical Management of Chronic Issues    Pt here for dizziness since Sunday     HPI  {History (Optional):23778}  ROS    Objective:     BP 116/81   Pulse 83   Ht 5\' 2"  (1.575 m)   Wt 191 lb (86.6 kg)   SpO2 97%   BMI 34.93 kg/m  {Vitals History (Optional):23777}  Physical Exam   Results for orders placed or performed in visit on 10/06/23  POCT INR  Result Value Ref Range   INR 2.3 2.0 - 3.0   POC INR      {Labs (Optional):23779}  The ASCVD Risk score (Arnett DK, et al., 2019) failed to calculate for the following reasons:   Risk score cannot be calculated because patient has a medical history suggesting prior/existing ASCVD    Assessment & Plan:   Problem List Items Addressed This Visit   None   No follow-ups on file.    Alison Irvine, FNP

## 2023-10-07 DIAGNOSIS — R2681 Unsteadiness on feet: Secondary | ICD-10-CM | POA: Insufficient documentation

## 2023-10-07 NOTE — Assessment & Plan Note (Signed)
 improved since initial onset 2 days ago. unremarkable exam in office today. she is neurologically intact. symptoms may be d/t hypogylcemia, therefore recommend checking BS if symptoms return.  Her husband is diabetic, and states that he can check with his machine.  Recommend good oral hydration and eating 3 small meals with healthy snacks during the day.

## 2023-10-28 ENCOUNTER — Other Ambulatory Visit: Payer: Self-pay | Admitting: Cardiology

## 2023-11-17 ENCOUNTER — Ambulatory Visit: Attending: Cardiology | Admitting: *Deleted

## 2023-11-17 DIAGNOSIS — Z5181 Encounter for therapeutic drug level monitoring: Secondary | ICD-10-CM | POA: Diagnosis not present

## 2023-11-17 DIAGNOSIS — I480 Paroxysmal atrial fibrillation: Secondary | ICD-10-CM | POA: Diagnosis not present

## 2023-11-17 LAB — POCT INR: INR: 2.1 (ref 2.0–3.0)

## 2023-11-17 NOTE — Patient Instructions (Signed)
 Continue 1 tablet daily except 1/2 tablet on Sundays and Wednesdays Continue greens Recheck INR in 6 weeks

## 2023-11-24 ENCOUNTER — Encounter: Payer: Self-pay | Admitting: Cardiology

## 2023-11-24 ENCOUNTER — Ambulatory Visit: Attending: Cardiology | Admitting: Cardiology

## 2023-11-24 VITALS — BP 132/80 | HR 72 | Ht 63.0 in | Wt 195.2 lb

## 2023-11-24 DIAGNOSIS — I4811 Longstanding persistent atrial fibrillation: Secondary | ICD-10-CM

## 2023-11-24 DIAGNOSIS — E782 Mixed hyperlipidemia: Secondary | ICD-10-CM

## 2023-11-24 DIAGNOSIS — I5032 Chronic diastolic (congestive) heart failure: Secondary | ICD-10-CM | POA: Diagnosis not present

## 2023-11-24 DIAGNOSIS — D6869 Other thrombophilia: Secondary | ICD-10-CM | POA: Diagnosis not present

## 2023-11-24 DIAGNOSIS — I10 Essential (primary) hypertension: Secondary | ICD-10-CM

## 2023-11-24 NOTE — Progress Notes (Signed)
 Clinical Summary [name] is a 73 y.o.female seen today for follow up of the following medical problems       1. Chest pain - history of atypical chest pain - ER visit 03/19/2021 with chest pain - trop neg x 2. EKG without acute ischemic changes - CT PE no acute PE, questionable chronic PE  05/2019 echo LVEF 50-55%, apical hypokinesis 04/2021 nuclear stress: apical to basal inferior/inferolateral fixed defect suggesting prior infarct. LVEF 52% 10/2021 echo: LVEF 45-50%, apex hypokinetic, indet diastolic fxn, mild RV dysfunction   - no significant chest pains.        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    - denies SOB/DOE, no recent edema - compliant with lasix , takes the lasix  prn, mainly every day       3. Long standing persistent afib - rate control strategy w/ diltiazem  and metoprolol    - previously on xarelto  however cost was too much, changed to coumadin .       - no recent palpitations - no bleeding on coumadin    3. HTN   -has not taken meds yet today     4. HLD - she is on atorvastatin    08/2022 TC 160 TG 100 HDL 38 LDL 103 01/2023 TC 152 TG 127 HDL 35 LDL 94 08/2023 TC 148 TG 94 HDL 38 LDL 92   5. LE edema - well controlled with lasix .  Past Medical History:  Diagnosis Date   Annual physical exam 06/07/2015   Aortic atherosclerosis (HCC)    Arthritis    Baker's cyst of knee 10/04/2013   Diabetes mellitus    Hyperlipidemia    Hypertension    Metabolic syndrome X 09/04/2013   Mild carotid artery disease (HCC)    OA (osteoarthritis) of knee 05/05/2012   Left, takes tramadol  on avg twice per week   Obesity    PAF (paroxysmal atrial fibrillation) (HCC)    Persistent atrial fibrillation (HCC)    Right bundle Samantha Vega block    +Palpitations.   EKG 11/2006:  NSR, RBBB, LAFB, markedly delayed R-wave  progression, no change; Echocardiogram in 10/2006-suboptimal quality, normal; Event recorder-PVCs, no significant arrhythmias or symptoms ;   TIA (transient ischemic attack) 06/02/2019   Unspecified visual loss 12/08/2008     No Known Allergies   Current Outpatient Medications  Medication Sig Dispense Refill   cholecalciferol (VITAMIN D3) 25 MCG (1000 UNIT) tablet Take 1,000 Units by mouth daily.     diltiazem  (CARDIZEM  CD) 120 MG 24 hr capsule TAKE 1 CAPSULE BY MOUTH EVERY DAY 90 capsule 2   ENTRESTO  24-26 MG TAKE 1 TABLET BY MOUTH TWICE A DAY 180 tablet 3   furosemide  (LASIX ) 20 MG tablet TAKE 1 TABLET BY MOUTH DAILY AS NEEDED FOR FLUID 90 tablet 1   metFORMIN  (GLUCOPHAGE -XR) 500 MG 24 hr tablet TAKE 1 TABLET BY MOUTH EVERY DAY WITH BREAKFAST 90 tablet 1   metoprolol  (TOPROL -XL) 200 MG 24 hr tablet TAKE 1 TABLET BY MOUTH EVERY DAY 90 tablet 3   montelukast  (SINGULAIR ) 10 MG tablet TAKE 1 TABLET BY MOUTH EVERYDAY AT BEDTIME 90 tablet 1   rosuvastatin  (CRESTOR ) 40 MG tablet TAKE 1 TABLET BY MOUTH EVERY DAY 90 tablet 0   terbinafine  (LAMISIL ) 250 MG tablet Take 1 tablet (250 mg total) by mouth daily. 42 tablet 1  warfarin (COUMADIN ) 5 MG tablet TAKE 1 TABLET DAILY EXCEPT 1/2 TABLET ON SUNDAY AND THURSDAY OR AS DIRECTED BY COUMADIN  CLINIC 100 tablet 3   No current facility-administered medications for this visit.     Past Surgical History:  Procedure Laterality Date   ABDOMINAL HYSTERECTOMY     BREAST BIOPSY Left    benign   CATARACT EXTRACTION W/PHACO Right 01/08/2016   Procedure: CATARACT EXTRACTION PHACO AND INTRAOCULAR LENS PLACEMENT (IOC);  Surgeon: Samantha Huff, MD;  Location: [facility];  Service: Ophthalmology;  Laterality: Right;  CDE: 5.78   CATARACT EXTRACTION W/PHACO Left 02/05/2016   Procedure: CATARACT EXTRACTION PHACO AND INTRAOCULAR LENS PLACEMENT LEFT EYE CDE=6.50;  Surgeon: Samantha Huff, MD;  Location: [facility];  Service: Ophthalmology;  Laterality: Left;   COLONOSCOPY   2006   Dr. Felipe Vega: Normal, repeat in 2009.   COLONOSCOPY N/A 10/08/2015   Procedure: COLONOSCOPY;  Surgeon: Samantha Jubilee, MD;  [facility];  Service: Endoscopy;  Laterality: N/A;  230 - moved to 1:30, spoke with pt    COLONOSCOPY WITH PROPOFOL  N/A 01/28/2021   Procedure: COLONOSCOPY WITH PROPOFOL ;  Surgeon: Samantha Greening, DO;  [facility];  Service: Endoscopy;  Laterality: N/A;  9:45am   POLYPECTOMY  10/08/2015   Procedure: POLYPECTOMY;  Surgeon: Samantha Jubilee, MD;  [facility];  Service: Endoscopy;;  cecal polyp removed via cold forceps with one clip placed/ rectal polyp removed via cold forcep   POLYPECTOMY  01/28/2021   Procedure: POLYPECTOMY;  Surgeon: Samantha Greening, DO;  [facility];  Service: Endoscopy;;   SHOULDER SURGERY  2006   Left-post-trauma   TOTAL ABDOMINAL HYSTERECTOMY W/ BILATERAL SALPINGOOPHORECTOMY       No Known Allergies    Family History  Problem Relation Age of Onset   Hypertension Mother    Arthritis Mother    Stroke Father    Hypertension Father    Coronary artery disease Father    Diabetes Sister    Sarcoidosis Sister    Diabetes Sister    Heart failure Brother    Congestive Heart Failure Brother    Colon cancer Neg Hx      Social History Samantha Vega reports that she has never smoked. She has never used smokeless tobacco. Samantha Vega reports no history of alcohol use.    Physical Examination Today's Vitals   11/24/23 1034  BP: 132/80  Pulse: 72  SpO2: 97%  Weight: 195 lb 3.2 oz (88.5 kg)  Height: 5\' 3"  (1.6 m)  PainSc: 0-No pain   Body mass index is 34.58 kg/m.  Gen: resting comfortably, no acute distress HEENT: no scleral icterus, pupils equal round and reactive, no palptable cervical adenopathy,  CV: irreg, no mrg/ no jvd Resp: Clear to auscultation bilaterally GI: abdomen is soft, non-tender, non-distended, normal bowel sounds, no hepatosplenomegaly MSK: extremities are warm,  no edema.  Skin: warm, no rash Neuro:  no focal deficits Psych: appropriate affect    Assessment and Plan  1. HFimpEF -no recent symptoms, euvolemic on exam - continue current meds  2. Long standing persistent Afib/acquried thrombophilia - DOACs too expensive, she is on coumadin  for stroke prevention.  - denies symptoms, continue current meds - EKG today shows rate controlled afib     3. HTN - bp at goal, contniue current meds   4. Hyperlipidemia - LDL at goal, continue current meds       Laurann Pollock, M.D.

## 2023-11-24 NOTE — Patient Instructions (Signed)

## 2023-12-11 ENCOUNTER — Ambulatory Visit (HOSPITAL_COMMUNITY)
Admission: RE | Admit: 2023-12-11 | Discharge: 2023-12-11 | Disposition: A | Discharge status: Home / Self Care | Source: Ambulatory Visit | Attending: Family Medicine | Admitting: Family Medicine

## 2023-12-11 ENCOUNTER — Other Ambulatory Visit: Payer: Self-pay | Admitting: Family Medicine

## 2023-12-11 ENCOUNTER — Telehealth: Payer: Self-pay

## 2023-12-11 DIAGNOSIS — Z1231 Encounter for screening mammogram for malignant neoplasm of breast: Secondary | ICD-10-CM | POA: Insufficient documentation

## 2023-12-11 NOTE — Telephone Encounter (Signed)
 Pt requesting refill of Terbinafine  250mg . Last filled in march. Please advise if refill is appropriate

## 2023-12-14 ENCOUNTER — Encounter (HOSPITAL_COMMUNITY): Payer: Self-pay

## 2023-12-14 ENCOUNTER — Ambulatory Visit: Payer: Self-pay

## 2023-12-14 ENCOUNTER — Other Ambulatory Visit: Payer: Self-pay

## 2023-12-14 ENCOUNTER — Emergency Department (HOSPITAL_COMMUNITY)
Admission: EM | Admit: 2023-12-14 | Discharge: 2023-12-14 | Disposition: A | Discharge status: Home / Self Care | Source: Other Acute Inpatient Hospital

## 2023-12-14 DIAGNOSIS — Z139 Encounter for screening, unspecified: Secondary | ICD-10-CM | POA: Diagnosis not present

## 2023-12-14 DIAGNOSIS — I482 Chronic atrial fibrillation, unspecified: Secondary | ICD-10-CM | POA: Insufficient documentation

## 2023-12-14 DIAGNOSIS — Z79899 Other long term (current) drug therapy: Secondary | ICD-10-CM | POA: Insufficient documentation

## 2023-12-14 DIAGNOSIS — Z7901 Long term (current) use of anticoagulants: Secondary | ICD-10-CM | POA: Diagnosis not present

## 2023-12-14 DIAGNOSIS — Z7984 Long term (current) use of oral hypoglycemic drugs: Secondary | ICD-10-CM | POA: Insufficient documentation

## 2023-12-14 DIAGNOSIS — R17 Unspecified jaundice: Secondary | ICD-10-CM | POA: Diagnosis present

## 2023-12-14 DIAGNOSIS — Z0000 Encounter for general adult medical examination without abnormal findings: Secondary | ICD-10-CM | POA: Diagnosis not present

## 2023-12-14 LAB — COMPREHENSIVE METABOLIC PANEL WITH GFR
ALT: 16 U/L (ref 0–44)
AST: 17 U/L (ref 15–41)
Albumin: 3.9 g/dL (ref 3.5–5.0)
Alkaline Phosphatase: 84 U/L (ref 38–126)
Anion gap: 8 (ref 5–15)
BUN: 16 mg/dL (ref 8–23)
CO2: 28 mmol/L (ref 22–32)
Calcium: 9.3 mg/dL (ref 8.9–10.3)
Chloride: 101 mmol/L (ref 98–111)
Creatinine, Ser: 1.03 mg/dL — ABNORMAL HIGH (ref 0.44–1.00)
GFR, Estimated: 57 mL/min — ABNORMAL LOW (ref >60)
Glucose, Bld: 102 mg/dL — ABNORMAL HIGH (ref 70–99)
Potassium: 4.6 mmol/L (ref 3.5–5.1)
Sodium: 137 mmol/L (ref 135–145)
Total Bilirubin: 0.4 mg/dL (ref 0.0–1.2)
Total Protein: 7.6 g/dL (ref 6.5–8.1)

## 2023-12-14 LAB — CBC WITH DIFFERENTIAL/PLATELET
Abs Immature Granulocytes: 0.01 10*3/uL (ref 0.00–0.07)
Basophils Absolute: 0 10*3/uL (ref 0.0–0.1)
Basophils Relative: 1 %
Eosinophils Absolute: 0.2 10*3/uL (ref 0.0–0.5)
Eosinophils Relative: 3 %
HCT: 39.9 % (ref 36.0–46.0)
Hemoglobin: 12.7 g/dL (ref 12.0–15.0)
Immature Granulocytes: 0 %
Lymphocytes Relative: 38 %
Lymphs Abs: 1.9 10*3/uL (ref 0.7–4.0)
MCH: 28.5 pg (ref 26.0–34.0)
MCHC: 31.8 g/dL (ref 30.0–36.0)
MCV: 89.5 fL (ref 80.0–100.0)
Monocytes Absolute: 0.5 10*3/uL (ref 0.1–1.0)
Monocytes Relative: 9 %
Neutro Abs: 2.4 10*3/uL (ref 1.7–7.7)
Neutrophils Relative %: 49 %
Platelets: 190 10*3/uL (ref 150–400)
RBC: 4.46 MIL/uL (ref 3.87–5.11)
RDW: 16.1 % — ABNORMAL HIGH (ref 11.5–15.5)
WBC: 5 10*3/uL (ref 4.0–10.5)
nRBC: 0 % (ref 0.0–0.2)

## 2023-12-14 LAB — LIPASE, BLOOD: Lipase: 35 U/L (ref 11–51)

## 2023-12-14 NOTE — Telephone Encounter (Signed)
 FYI Only or Action Required?: FYI only for provider.  Patient was last seen in primary care on 10/06/2023 by Bevely Doffing, FNP. Called Nurse Triage reporting jaundice eye. Symptoms began about a month ago. Interventions attempted: Nothing. Symptoms are: unchanged.  Triage Disposition: See Physician Within 24 Hours  Patient/caregiver understands and will follow disposition?: Yes  Copied from CRM [id number]. Topic: Clinical - Red Word Triage >> Dec 14, 2023  2:20 PM Larissa S wrote: [facility] that prompted transfer to Nurse Triage: jaundice in eyes  Lonell Pa, NP- [facility] Reason for Disposition  Jaundice  Answer Assessment - Initial Assessment Questions Pt has NP from [facility] at her home for annual check in. NP calling to report jaundice to pt bilat eyes, pt reports have been yellow for more than a month. Pt not able to get into PCP office soon enough. UC appmy made for pt at closest UC to pt. UC address/phone # given to pt.   1. EYES: Are the whites of the eyes yellow?     Yellow eyes, no discomfort   2. ONSET: When did the jaundice begin?     Pt states over a month  3. FEVER: Do you have a fever? If Yes, ask: What is it, how was it measured, and when did it start?     no 4. VOMITING: Have you been vomiting?      no 5. DEHYDRATION: Have you been able to keep any liquids down? When was the last time you urinated? Do you feel dizzy?     no 6. ABDOMEN PAIN: Are you having any abdomen pain? If Yes, ask: How bad is it?  (e.g., mild, moderate, severe)  - MILD - doesn't interfere with normal activities   - MODERATE - interferes with normal activities or awakens from sleep   - SEVERE - patient doesn't want to move (R/O peritonitis)      No abd pain,  7. CONTACT: Have you been around anyone who was jaundiced?     No 8. CAUSE: What do you think is causing the jaundice?     Pt has been on Terbinafine  for a while  for toe fungus (since 09/03/23. NP Lonell calling to report  jaundice of eyes because this antifungal medication can cause liver dysfunction.   9. OTHER SYMPTOMS: Do you have any other symptoms? (e.g., shaking chills, itching, blood in stool)     Been on this med for a long time. Pt reports no prob with urintation & no lower back pain.  Pt on Burbenafine  for toe fungus can cause liver diseaes  Protocols used: [employer]

## 2023-12-14 NOTE — ED Provider Notes (Signed)
 [place] EMERGENCY DEPARTMENT AT [facility] Provider Note   CSN: [id number] Arrival date & time: 12/14/23  1723     Patient presents with: Jaundice   Samantha Vega is a 73 y.o. female.   73 year old female presents for evaluation of yellow eyes.  She states her eyes turn yellow when she goes out in the sun, but when she goes inside it goes away.  States this has been going on for a while.  She states that her primary doctor sent her here for liver testing.  She denies any other symptoms or concerns at this time.  She states she has not had any abdominal pain or swelling no nausea or vomiting and no leg swelling.        Prior to Admission medications   Medication Sig Start Date End Date Taking? Authorizing Provider  cholecalciferol (VITAMIN D3) 25 MCG (1000 UNIT) tablet Take 1,000 Units by mouth daily.   Yes [provider]  diltiazem  (CARDIZEM  CD) 120 MG 24 hr capsule TAKE 1 CAPSULE BY MOUTH EVERY DAY 10/06/23  Yes Antonetta Rollene BRAVO, MD  ENTRESTO  24-26 MG TAKE 1 TABLET BY MOUTH TWICE A DAY 08/26/23  Yes Alvan Dorn FALCON, MD  furosemide  (LASIX ) 20 MG tablet TAKE 1 TABLET BY MOUTH DAILY AS NEEDED FOR FLUID 09/07/23  Yes Alvan Dorn FALCON, MD  metFORMIN  (GLUCOPHAGE -XR) 500 MG 24 hr tablet TAKE 1 TABLET BY MOUTH EVERY DAY WITH BREAKFAST 08/06/23  Yes Antonetta Rollene BRAVO, MD  metoprolol  (TOPROL -XL) 200 MG 24 hr tablet TAKE 1 TABLET BY MOUTH EVERY DAY 08/07/23  Yes Antonetta Rollene BRAVO, MD  montelukast  (SINGULAIR ) 10 MG tablet TAKE 1 TABLET BY MOUTH EVERYDAY AT BEDTIME 08/06/23  Yes Antonetta Rollene BRAVO, MD  rosuvastatin  (CRESTOR ) 40 MG tablet TAKE 1 TABLET BY MOUTH EVERY DAY 10/28/23  Yes Branch, Dorn FALCON, MD  terbinafine  (LAMISIL ) 250 MG tablet Take 1 tablet (250 mg total) by mouth daily. 09/03/23  Yes Antonetta Rollene BRAVO, MD  warfarin (COUMADIN ) 5 MG tablet TAKE 1 TABLET DAILY EXCEPT 1/2 TABLET ON SUNDAY AND THURSDAY OR AS DIRECTED BY COUMADIN  CLINIC 12/15/22  Yes  Antonetta Rollene BRAVO, MD    Allergies: Patient has no known allergies.    Review of Systems  Constitutional:  Negative for chills and fever.  HENT:  Negative for ear pain and sore throat.   Eyes:  Negative for pain and visual disturbance.       Yellow eyes  Respiratory:  Negative for cough and shortness of breath.   Cardiovascular:  Negative for chest pain and palpitations.  Gastrointestinal:  Negative for abdominal pain and vomiting.  Genitourinary:  Negative for dysuria and hematuria.  Musculoskeletal:  Negative for arthralgias and back pain.  Skin:  Negative for color change and rash.  Neurological:  Negative for seizures and syncope.  All other systems reviewed and are negative.   Updated Vital Signs BP 118/76   Pulse 82   Temp (!) 96.7 F (35.9 C)   Resp 20   Ht 5' 2 (1.575 m)   Wt 81.2 kg   SpO2 99%   BMI 32.74 kg/m   Physical Exam Vitals and nursing note reviewed.  Constitutional:      General: She is not in acute distress.    Appearance: Normal appearance. She is well-developed. She is not ill-appearing.  HENT:     Head: Normocephalic and atraumatic.   Eyes:     Conjunctiva/sclera: Conjunctivae normal.     Comments:  Eyes with some yellow around the iris, no scleral icterus    Cardiovascular:     Rate and Rhythm: Normal rate and regular rhythm.     Heart sounds: Normal heart sounds. No murmur heard. Pulmonary:     Effort: Pulmonary effort is normal. No respiratory distress.     Breath sounds: Normal breath sounds.  Abdominal:     General: Abdomen is flat. There is no distension.     Palpations: Abdomen is soft. There is no mass.     Tenderness: There is no abdominal tenderness.     Hernia: No hernia is present.   Musculoskeletal:        General: No swelling.     Cervical back: Neck supple.     Right lower leg: No edema.     Left lower leg: No edema.   Skin:    General: Skin is warm and dry.     Capillary Refill: Capillary refill takes less than 2  seconds.   Neurological:     Mental Status: She is alert.   Psychiatric:        Mood and Affect: Mood normal.     (all labs ordered are listed, but only abnormal results are displayed) Labs Reviewed  CBC WITH DIFFERENTIAL/PLATELET - Abnormal; Notable for the following components:      Result Value   RDW 16.1 (*)    All other components within normal limits  COMPREHENSIVE METABOLIC PANEL WITH GFR - Abnormal; Notable for the following components:   Glucose, Bld 102 (*)    Creatinine, Ser 1.03 (*)    GFR, Estimated 57 (*)    All other components within normal limits  LIPASE, BLOOD    EKG: None  Radiology: No results found.   Procedures   Medications Ordered in the ED - No data to display                                  Medical Decision Making Medical Decision Making Nursing notes are reviewed. Differential diagnosis for this patient would include but not limited to: Jaundice, acute liver failure, cholecystitis, cancer, other  Records reviewed: Outpatient records reviewed and patient seen on 6/10-25 for chronic A-fib in the cardiology office   Emergency Department Course:  Vital signs and pulse oximetry are reviewed, evaluated by myself and found to be within normal limits prior to final disposition. Findings of laboratory testing and medical imaging are discussed with patient and family that is available. Patient agrees with the medical care plan as follows:  Patient's lab workup reviewed by me and unremarkable.  He does not have scleral icterus or jaundice.  Has no other concerning signs or symptoms of abdominal pain swelling bloating or leg swelling.  Thank that the yellowing in her eyes is likely from sun exposure and she is advised to wear sunglasses and obtain eye protection as needed.  Advise close follow-up primary care doctor otherwise return to the ER for new or worsening symptoms.  She feels comfortable with plan be discharged home.  All results were  discussed with patient and family at bedside.  Problems Addressed: Encounter for medical screening examination: self-limited or minor problem  Amount and/or Complexity of Data Reviewed External Data Reviewed: notes. Labs: ordered. Decision-making details documented in ED Course.  Risk OTC drugs.     Final diagnoses:  Encounter for medical screening examination    ED Discharge Orders  None          Gennaro Duwaine CROME, DO 12/14/23 2228

## 2023-12-14 NOTE — Discharge Instructions (Signed)
 Try to wear eye protection when you go out in the sun.  Follow-up with your primary care doctor.

## 2023-12-14 NOTE — ED Triage Notes (Signed)
 Pt arrived via POV following being advised by her PCP to seek evaluation for jaundice sclera for over past month. Pt denies any pain. Pt presents in NAD.

## 2023-12-15 ENCOUNTER — Ambulatory Visit: Payer: Self-pay

## 2023-12-20 LAB — MICROALBUMIN / CREATININE URINE RATIO: Microalb Creat Ratio: <30

## 2023-12-23 ENCOUNTER — Telehealth: Payer: Self-pay | Admitting: Family Medicine

## 2023-12-23 NOTE — Telephone Encounter (Signed)
 Prescription Request  12/23/2023  LOV: 09/03/2023  What is the name of the medication or equipment? terbinafine  (LAMISIL ) 250 MG tablet [[id number]]   Have you contacted your pharmacy to request a refill? Yes   Which pharmacy would you like this sent to?  [facility]   Patient notified that their request is being sent to the clinical staff for review and that they should receive a response within 2 business days.   Please advise at walked into office

## 2023-12-24 NOTE — Telephone Encounter (Signed)
Pt informed

## 2023-12-29 ENCOUNTER — Ambulatory Visit: Attending: Cardiology | Admitting: *Deleted

## 2023-12-29 DIAGNOSIS — I480 Paroxysmal atrial fibrillation: Secondary | ICD-10-CM | POA: Diagnosis not present

## 2023-12-29 DIAGNOSIS — Z5181 Encounter for therapeutic drug level monitoring: Secondary | ICD-10-CM

## 2023-12-29 LAB — POCT INR: INR: 1.5 — AB (ref 2.0–3.0)

## 2023-12-29 NOTE — Patient Instructions (Signed)
 Take warfarin 1 1/2 tablets today, take 1 tablet tomorrow then resume 1 tablet daily except 1/2 tablet on Sundays and Wednesdays Continue greens Recheck INR in 2 weeks

## 2023-12-29 NOTE — Progress Notes (Signed)
Please see anticoagulation encounter.

## 2024-01-12 ENCOUNTER — Encounter

## 2024-01-13 ENCOUNTER — Ambulatory Visit: Attending: Cardiology | Admitting: *Deleted

## 2024-01-13 DIAGNOSIS — Z5181 Encounter for therapeutic drug level monitoring: Secondary | ICD-10-CM | POA: Diagnosis not present

## 2024-01-13 DIAGNOSIS — I480 Paroxysmal atrial fibrillation: Secondary | ICD-10-CM | POA: Diagnosis not present

## 2024-01-13 LAB — POCT INR: INR: 2.6 (ref 2.0–3.0)

## 2024-01-13 NOTE — Patient Instructions (Signed)
Continue warfarin 1 tablet daily except 1/2 tablet on Sundays and Wednesdays Continue greens Recheck INR in 4 weeks

## 2024-01-13 NOTE — Progress Notes (Signed)
 INR 2.6; Please see anticoagulation encounter

## 2024-01-15 ENCOUNTER — Telehealth: Payer: Self-pay

## 2024-01-15 NOTE — Telephone Encounter (Signed)
 Copied from CRM [id number]. Topic: Clinical - Prescription Issue >> Jan 15, 2024  2:43 PM Jasmin G wrote: Reason for CRM: Pt needs a call back from nurse, she states that was taken off a medication but can't figure out which one is it, upon doing my research the only medication that I could find as not active is the warfarin, but she said that's not the one, I told her I would try to reach a nurse to help her find which one is missing because I could not help her, please call back ASAP at [phone]

## 2024-01-19 ENCOUNTER — Other Ambulatory Visit: Payer: Self-pay | Admitting: Family Medicine

## 2024-01-19 ENCOUNTER — Telehealth: Payer: Self-pay

## 2024-01-19 NOTE — Telephone Encounter (Signed)
 Pt stopping by sometime today to get med list and discuss

## 2024-01-19 NOTE — Telephone Encounter (Signed)
 Pt requesting refill of Warfarin. Please confirm pt is to still be taking this?

## 2024-01-20 ENCOUNTER — Telehealth: Payer: Self-pay | Admitting: Cardiology

## 2024-01-20 MED ORDER — WARFARIN SODIUM 5 MG PO TABS: ORAL_TABLET | ORAL | 1 refills | Status: AC

## 2024-01-20 NOTE — Telephone Encounter (Signed)
 Refill request for warfarin:  Last INR was 2.6 on 01/13/24 Next INR due 02/10/24 LOV was 11/24/23  Refill approved.

## 2024-01-20 NOTE — Telephone Encounter (Signed)
Pt informed

## 2024-01-20 NOTE — Telephone Encounter (Signed)
 Request refills with her warfarin

## 2024-02-01 ENCOUNTER — Encounter: Payer: Self-pay | Admitting: Podiatry

## 2024-02-01 ENCOUNTER — Ambulatory Visit: Admitting: Podiatry

## 2024-02-01 DIAGNOSIS — M79674 Pain in right toe(s): Secondary | ICD-10-CM | POA: Diagnosis not present

## 2024-02-01 DIAGNOSIS — E1151 Type 2 diabetes mellitus with diabetic peripheral angiopathy without gangrene: Secondary | ICD-10-CM | POA: Diagnosis not present

## 2024-02-01 DIAGNOSIS — M79675 Pain in left toe(s): Secondary | ICD-10-CM

## 2024-02-01 DIAGNOSIS — L84 Corns and callosities: Secondary | ICD-10-CM

## 2024-02-01 DIAGNOSIS — B351 Tinea unguium: Secondary | ICD-10-CM | POA: Diagnosis not present

## 2024-02-01 LAB — HM DIABETES FOOT EXAM: HM Diabetic Foot Exam: LOW

## 2024-02-01 NOTE — Progress Notes (Signed)
 Subjective:  Patient ID: Samantha Vega, female    DOB: [birth year],  MRN: [id number]   Samantha Vega presents to clinic today for:  Chief Complaint  Patient presents with   Diabetes    [facility]. A1c 6.6. Taking Warfarin. No calluses   Patient notes nails are thick, discolored, elongated and painful in shoegear when trying to ambulate.  Her female companion is with her today.  He is actively eating a bag of food during the entire appointment.  PCP is Antonetta Rollene BRAVO, MD. last seen 09/03/2023  Past Medical History:  Diagnosis Date   Annual physical exam 06/07/2015   Aortic atherosclerosis (HCC)    Arthritis    Baker's cyst of knee 10/04/2013   Diabetes mellitus    Hyperlipidemia    Hypertension    Metabolic syndrome X 09/04/2013   Mild carotid artery disease (HCC)    OA (osteoarthritis) of knee 05/05/2012   Left, takes tramadol  on avg twice per week   Obesity    PAF (paroxysmal atrial fibrillation) (HCC)    Persistent atrial fibrillation (HCC)    Right bundle branch block    +Palpitations.   EKG 11/2006:  NSR, RBBB, LAFB, markedly delayed R-wave progression, no change; Echocardiogram in 10/2006-suboptimal quality, normal; Event recorder-PVCs, no significant arrhythmias or symptoms ;   TIA (transient ischemic attack) 06/02/2019   Unspecified visual loss 12/08/2008    Past Surgical History:  Procedure Laterality Date   ABDOMINAL HYSTERECTOMY     BREAST BIOPSY Left    benign   CATARACT EXTRACTION W/PHACO Right 01/08/2016   Procedure: CATARACT EXTRACTION PHACO AND INTRAOCULAR LENS PLACEMENT (IOC);  Surgeon: Oneil Platts, MD;  Location: [facility];  Service: Ophthalmology;  Laterality: Right;  CDE: 5.78   CATARACT EXTRACTION W/PHACO Left 02/05/2016   Procedure: CATARACT EXTRACTION PHACO AND INTRAOCULAR LENS PLACEMENT LEFT EYE CDE=6.50;  Surgeon: Oneil Platts, MD;  Location: [facility];  Service: Ophthalmology;  Laterality: Left;   COLONOSCOPY  2006   Dr. Claudene: Normal, repeat in  2009.   COLONOSCOPY N/A 10/08/2015   Procedure: COLONOSCOPY;  Surgeon: Margo LITTIE Haddock, MD;  [facility];  Service: Endoscopy;  Laterality: N/A;  230 - moved to 1:30, spoke with pt    COLONOSCOPY WITH PROPOFOL  N/A 01/28/2021   Procedure: COLONOSCOPY WITH PROPOFOL ;  Surgeon: Cindie Carlin POUR, DO;  [facility];  Service: Endoscopy;  Laterality: N/A;  9:45am   POLYPECTOMY  10/08/2015   Procedure: POLYPECTOMY;  Surgeon: Margo LITTIE Haddock, MD;  [facility];  Service: Endoscopy;;  cecal polyp removed via cold forceps with one clip placed/ rectal polyp removed via cold forcep   POLYPECTOMY  01/28/2021   Procedure: POLYPECTOMY;  Surgeon: Cindie Carlin POUR, DO;  [facility];  Service: Endoscopy;;   SHOULDER SURGERY  2006   Left-post-trauma   TOTAL ABDOMINAL HYSTERECTOMY W/ BILATERAL SALPINGOOPHORECTOMY      No Known Allergies  Review of Systems: Negative except as noted in the HPI.  Objective:  Samantha Vega is a pleasant 73 y.o. female in NAD. AAO x 3.  Vascular Examination: Capillary refill time is 3-5 seconds to toes bilateral. Palpable pedal pulses b/l LE. Digital hair present b/l.  Skin temperature gradient WNL b/l. No varicosities b/l. No cyanosis noted b/l.   Dermatological Examination: Pedal skin with normal turgor, texture and tone b/l. No open wounds. No interdigital macerations b/l. Toenails x10 are 3mm thick, discolored, dystrophic with subungual debris.  There is pain with compression of the nail plates.  They are elongated x10.  There are hyperkeratotic lesions on the plantar medial aspect of the first MPJ bilateral.     Latest Ref Rng & Units 09/03/2023    9:35 AM 02/10/2023    9:46 AM  Hemoglobin A1C  Hemoglobin-A1c 4.8 - 5.6 % 6.6  6.4    Assessment/Plan: 1. Pain due to onychomycosis of toenails of both feet   2. Callus of foot   3. Type II diabetes mellitus with peripheral circulatory disorder (HCC)    The mycotic toenails  were sharply debrided x10 with sterile nail nippers and a power debriding burr to decrease bulk/thickness and length.    The calluses at the first MPJ were sanded with an umbrella bur.  As they were walking to the checkout desk, her female companion walked into my private office to throw his bag of food away in my trash can.  Return in about 4 months (around 06/02/2024) for [facility] with me or Dr. Loreda.   Awanda CHARM Imperial, DPM, FACFAS [facility]     [address] [place], [place] [address]                Office [phone]  Fax [phone]

## 2024-02-10 ENCOUNTER — Ambulatory Visit: Attending: Cardiology | Admitting: *Deleted

## 2024-02-10 DIAGNOSIS — I480 Paroxysmal atrial fibrillation: Secondary | ICD-10-CM

## 2024-02-10 DIAGNOSIS — Z5181 Encounter for therapeutic drug level monitoring: Secondary | ICD-10-CM | POA: Diagnosis not present

## 2024-02-10 LAB — POCT INR: INR: 1.9 — AB (ref 2.0–3.0)

## 2024-02-10 NOTE — Patient Instructions (Signed)
 Take warfarin 1 tablet tonight then resume 1 tablet daily except 1/2 tablet on Sundays and Wednesdays Continue greens Recheck INR in 4 weeks

## 2024-02-10 NOTE — Progress Notes (Signed)
 INR 1.9; Please see anticoagulation encounter

## 2024-02-19 ENCOUNTER — Other Ambulatory Visit: Payer: Self-pay | Admitting: Cardiology

## 2024-03-07 ENCOUNTER — Ambulatory Visit (INDEPENDENT_AMBULATORY_CARE_PROVIDER_SITE_OTHER): Payer: Medicare Other

## 2024-03-07 VITALS — Ht 62.0 in | Wt 179.0 lb

## 2024-03-07 DIAGNOSIS — Z780 Asymptomatic menopausal state: Secondary | ICD-10-CM

## 2024-03-07 DIAGNOSIS — Z0000 Encounter for general adult medical examination without abnormal findings: Secondary | ICD-10-CM | POA: Diagnosis not present

## 2024-03-07 NOTE — Progress Notes (Signed)
 Please attest and cosign this visit due to patients primary care provider not being immediately available at the time the visit was completed.   Subjective:   Samantha Vega is a 73 y.o. who presents for a Medicare Wellness preventive visit. As a reminder, Annual Wellness Visits don't include a physical exam, and some assessments may be limited, especially if this visit is performed virtually. We may recommend an in-person follow-up visit with your provider if needed.  Visit Complete: Virtual I connected with  Delano CHRISTELLA Rummer on 03/07/24 by a audio enabled telemedicine application and verified that I am speaking with the correct person using two identifiers.  Patient Location: Home  Provider Location: Home Office  I discussed the limitations of evaluation and management by telemedicine. The patient expressed understanding and agreed to proceed.  Vital Signs: Because this visit was a virtual/telehealth visit, some criteria may be missing or patient reported. Any vitals not documented were not able to be obtained and vitals that have been documented are patient reported. VideoDeclined- This patient declined Librarian, academic. Therefore the visit was completed with audio only.  Persons Participating in Visit: Patient.  AWV Questionnaire: No: Patient Medicare AWV questionnaire was not completed prior to this visit. Cardiac Risk Factors include: advanced age (>49men, >83 women);diabetes mellitus;dyslipidemia;hypertension;Other (see comment), Risk factor comments: AFib,     Objective:    Today's Vitals   03/07/24 0849  Weight: 179 lb (81.2 kg)  Height: 5' 2 (1.575 m)   Body mass index is 32.74 kg/m.    03/07/2024    8:47 AM 12/14/2023    5:44 PM 03/02/2023    8:25 AM 07/21/2022    1:57 PM 01/27/2022    7:57 AM 10/15/2021   11:05 AM 09/27/2021   10:26 AM  Advanced Directives  Does Patient Have a Medical Advance Directive? No No No No No No No  Would  patient like information on creating a medical advance directive? Yes (MAU/Ambulatory/Procedural Areas - Information given) No - Patient declined No - Patient declined No - Patient declined Yes (ED - Information included in AVS)  No - Patient declined    Current Medications (verified) Outpatient Encounter Medications as of 03/07/2024  Medication Sig   cholecalciferol (VITAMIN D3) 25 MCG (1000 UNIT) tablet Take 1,000 Units by mouth daily.   diltiazem  (CARDIZEM  CD) 120 MG 24 hr capsule TAKE 1 CAPSULE BY MOUTH EVERY DAY   ENTRESTO  24-26 MG TAKE 1 TABLET BY MOUTH TWICE A DAY   furosemide  (LASIX ) 20 MG tablet TAKE 1 TABLET BY MOUTH DAILY AS NEEDED FOR FLUID   metFORMIN  (GLUCOPHAGE -XR) 500 MG 24 hr tablet TAKE 1 TABLET BY MOUTH EVERY DAY WITH BREAKFAST   metoprolol  (TOPROL -XL) 200 MG 24 hr tablet TAKE 1 TABLET BY MOUTH EVERY DAY   montelukast  (SINGULAIR ) 10 MG tablet TAKE 1 TABLET BY MOUTH EVERYDAY AT BEDTIME   rosuvastatin  (CRESTOR ) 40 MG tablet TAKE 1 TABLET BY MOUTH EVERY DAY   terbinafine  (LAMISIL ) 250 MG tablet Take 1 tablet (250 mg total) by mouth daily.   warfarin (COUMADIN ) 5 MG tablet Take 1/2 to 1 tablet daily or as directed by coumadin  clinic   No facility-administered encounter medications on file as of 03/07/2024.    Allergies (verified) Patient has no known allergies.   History: Past Medical History:  Diagnosis Date   Annual physical exam 06/07/2015   Aortic atherosclerosis (HCC)    Arthritis    Baker's cyst of knee 10/04/2013   Diabetes  mellitus    Hyperlipidemia    Hypertension    Metabolic syndrome X 09/04/2013   Mild carotid artery disease (HCC)    OA (osteoarthritis) of knee 05/05/2012   Left, takes tramadol  on avg twice per week   Obesity    PAF (paroxysmal atrial fibrillation) (HCC)    Persistent atrial fibrillation (HCC)    Right bundle branch block    +Palpitations.   EKG 11/2006:  NSR, RBBB, LAFB, markedly delayed R-wave progression, no change; Echocardiogram  in 10/2006-suboptimal quality, normal; Event recorder-PVCs, no significant arrhythmias or symptoms ;   TIA (transient ischemic attack) 06/02/2019   Unspecified visual loss 12/08/2008   Past Surgical History:  Procedure Laterality Date   ABDOMINAL HYSTERECTOMY     BREAST BIOPSY Left    benign   CATARACT EXTRACTION W/PHACO Right 01/08/2016   Procedure: CATARACT EXTRACTION PHACO AND INTRAOCULAR LENS PLACEMENT (IOC);  Surgeon: Oneil Platts, MD;  Location: [facility];  Service: Ophthalmology;  Laterality: Right;  CDE: 5.78   CATARACT EXTRACTION W/PHACO Left 02/05/2016   Procedure: CATARACT EXTRACTION PHACO AND INTRAOCULAR LENS PLACEMENT LEFT EYE CDE=6.50;  Surgeon: Oneil Platts, MD;  Location: [facility];  Service: Ophthalmology;  Laterality: Left;   COLONOSCOPY  2006   Dr. Claudene: Normal, repeat in 2009.   COLONOSCOPY N/A 10/08/2015   Procedure: COLONOSCOPY;  Surgeon: Margo LITTIE Haddock, MD;  [facility];  Service: Endoscopy;  Laterality: N/A;  230 - moved to 1:30, spoke with pt    COLONOSCOPY WITH PROPOFOL  N/A 01/28/2021   Procedure: COLONOSCOPY WITH PROPOFOL ;  Surgeon: Cindie Carlin POUR, DO;  [facility];  Service: Endoscopy;  Laterality: N/A;  9:45am   POLYPECTOMY  10/08/2015   Procedure: POLYPECTOMY;  Surgeon: Margo LITTIE Haddock, MD;  [facility];  Service: Endoscopy;;  cecal polyp removed via cold forceps with one clip placed/ rectal polyp removed via cold forcep   POLYPECTOMY  01/28/2021   Procedure: POLYPECTOMY;  Surgeon: Cindie Carlin POUR, DO;  [facility];  Service: Endoscopy;;   SHOULDER SURGERY  2006   Left-post-trauma   TOTAL ABDOMINAL HYSTERECTOMY W/ BILATERAL SALPINGOOPHORECTOMY     Family History  Problem Relation Age of Onset   Hypertension Mother    Arthritis Mother    Stroke Father    Hypertension Father    Coronary artery disease Father    Diabetes Sister    Sarcoidosis Sister    Diabetes Sister    Heart failure Brother    Congestive Heart  Failure Brother    Colon cancer Neg Hx    Social History   Socioeconomic History   Marital status: Married    Spouse name: Not on file   Number of children: 0   Years of education: Not on file   Highest education level: Not on file  Occupational History   Occupation: Programmer, applications: UNEMPLOYED  Tobacco Use   Smoking status: Never   Smokeless tobacco: Never  Vaping Use   Vaping status: Never Used  Substance and Sexual Activity   Alcohol use: No   Drug use: No   Sexual activity: Yes    Birth control/protection: Surgical  Other Topics Concern   Not on file  Social History Narrative   Not on file   Social Drivers of Health   Financial Resource Strain: Low Risk  (03/07/2024)   Overall Financial Resource Strain (CARDIA)    Difficulty of Paying Living Expenses: Not hard at all  Food  Insecurity: No Food Insecurity (03/07/2024)   Hunger Vital Sign    Worried About Running Out of Food in the Last Year: Never true    Ran Out of Food in the Last Year: Never true  Transportation Needs: No Transportation Needs (03/07/2024)   PRAPARE - Administrator, Civil Service (Medical): No    Lack of Transportation (Non-Medical): No  Physical Activity: Sufficiently Active (03/07/2024)   Exercise Vital Sign    Days of Exercise per Week: 7 days    Minutes of Exercise per Session: 30 min  Stress: No Stress Concern Present (03/07/2024)   [employer] of Occupational Health - Occupational Stress Questionnaire    Feeling of Stress: Not at all  Social Connections: Moderately Integrated (03/07/2024)   Social Connection and Isolation Panel    Frequency of Communication with Friends and Family: More than three times a week    Frequency of Social Gatherings with Friends and Family: More than three times a week    Attends Religious Services: More than 4 times per year    Active Member of [employer] or Organizations: No    Attends Engineer, structural: Never    Marital  Status: Married    Tobacco Counseling Counseling given: Yes   Clinical Intake: Pre-visit preparation completed: Yes Pain : No/denies pain   BMI - recorded: 32.74 Nutritional Status: BMI > 30  Obese Nutritional Risks: None Diabetes: Yes (per patient\) CBG done?: No (telehealth visit) Did pt. bring in CBG monitor from home?: No Lab Results  Component Value Date   HGBA1C 6.6 (H) 09/03/2023   HGBA1C 6.4 (H) 02/10/2023   HGBA1C 6.3 (H) 08/21/2022    How often do you need to have someone help you when you read instructions, pamphlets, or other written materials from your doctor or pharmacy?: 1 - Never Interpreter Needed?: No Information entered by :: [place] W CMA (AAMA)  Activities of Daily Living     03/07/2024    8:58 AM  In your present state of health, do you have any difficulty performing the following activities:  Hearing? 0  Vision? 0  Difficulty concentrating or making decisions? 0  Walking or climbing stairs? 0  Dressing or bathing? 0  Doing errands, shopping? 0  Preparing Food and eating ? N  Using the Toilet? N  In the past six months, have you accidently leaked urine? N  Do you have problems with loss of bowel control? N  Managing your Medications? N  Managing your Finances? N  Housekeeping or managing your Housekeeping? N   Patient Care Team: Antonetta Rollene BRAVO, MD as PCP - General Branch, Dorn FALCON, MD as Consulting Physician (Cardiology) Cindie Carlin POUR, DO as Consulting Physician (Internal Medicine)   I have updated your Care Teams any recent Medical Services you may have received from other providers in the past year.     Assessment:   This is a routine wellness examination for [place].  Hearing/Vision screen Hearing Screening - Comments:: Patient denies any hearing difficulties.   Vision Screening - Comments:: Patient wears reading glasses only. She isn't up to date on yearly exams. Letter with list of eye doctors mailed to patient.   Goals  Addressed               This Visit's Progress     Exercise 3x per week (30 min per time)   On track     Recommend starting a routine exercise program at least 3 days a  week for 30-45 minutes at a time as tolerated.        Patient Stated (pt-stated)   On track     Stay healthy        Depression Screen     03/07/2024    8:52 AM 10/06/2023   10:13 AM 03/02/2023    8:26 AM 03/02/2023    8:24 AM 01/28/2023   11:34 AM 08/21/2022    8:16 AM 03/03/2022    9:53 AM  PHQ 2/9 Scores  PHQ - 2 Score 0 0 0 0 0 0 0  PHQ- 9 Score 0 0          Fall Risk     03/07/2024    8:56 AM 10/06/2023   10:13 AM 09/03/2023    8:39 AM 03/02/2023    8:25 AM 01/28/2023   11:34 AM  Fall Risk   Falls in the past year? 0 0 0 0 0  Number falls in past yr: 0 0 0 0 0  Injury with Fall? 0 0 0 0 0  Risk for fall due to : No Fall Risks No Fall Risks No Fall Risks No Fall Risks No Fall Risks  Follow up Falls evaluation completed;Education provided;Falls prevention discussed Falls evaluation completed Falls evaluation completed Falls evaluation completed Falls evaluation completed    MEDICARE RISK AT HOME:  Medicare Risk at Home Any stairs in or around the home?: No If so, are there any without handrails?: No Home free of loose throw rugs in walkways, pet beds, electrical cords, etc?: Yes Adequate lighting in your home to reduce risk of falls?: Yes Life alert?: No Use of a cane, walker or w/c?: No Grab bars in the bathroom?: No Shower chair or bench in shower?: No Elevated toilet seat or a handicapped toilet?: No  TIMED UP AND GO: Was the test performed?  No  Cognitive Function: 6CIT completed    03/02/2023    8:26 AM  MMSE - Mini Mental State Exam  Not completed: Unable to complete        03/07/2024    9:00 AM 03/02/2023    8:26 AM 01/27/2022    8:12 AM 01/21/2021    8:22 AM 01/19/2020    8:25 AM  6CIT Screen  What Year? 0 points 0 points 0 points 0 points 0 points  What month? 0 points 0 points 0  points 0 points 0 points  What time? 0 points 0 points 0 points 3 points 0 points  Count back from 20 0 points 0 points 0 points 4 points 4 points  Months in reverse 0 points 0 points 2 points 4 points 4 points  Repeat phrase 0 points 0 points 10 points 2 points 10 points  Total Score 0 points 0 points 12 points 13 points 18 points    Immunizations Immunization History  Administered Date(s) Administered   Fluad Quad(high Dose 65+) 04/04/2019, 02/21/2020, 03/08/2021, 08/21/2022   Fluad Trivalent(High Dose 65+) 09/03/2023   Influenza Split 04/22/2011, 04/16/2012   Influenza Whole 03/23/2007, 05/18/2009   Influenza,inj,Quad PF,6+ Mos 04/25/2013, 05/30/2014, 06/07/2015, 02/12/2016, 04/13/2017, 02/25/2018   Moderna Sars-Covid-2 Vaccination 09/02/2019, 10/05/2019, 06/01/2020, 01/04/2021   PNEUMOCOCCAL CONJUGATE-20 11/11/2021   Pneumococcal Conjugate-13 09/06/2014   Pneumococcal Polysaccharide-23 10/31/2009, 02/12/2016   Td 06/17/2003   Tdap 04/22/2011   Zoster Recombinant(Shingrix) 11/11/2021, 02/13/2022   Zoster, Live 05/18/2009    Screening Tests Health Maintenance  Topic Date Due   DEXA SCAN  01/21/2020   FOOT EXAM  08/27/2023  Influenza Vaccine  01/15/2024   COVID-19 Vaccine (5 - 2025-26 season) 02/15/2024   OPHTHALMOLOGY EXAM  02/23/2024   HEMOGLOBIN A1C  03/05/2024   DTaP/Tdap/Td (3 - Td or Tdap) 09/02/2024 (Originally 04/21/2021)   Mammogram  12/10/2024   Diabetic kidney evaluation - eGFR measurement  12/13/2024   Diabetic kidney evaluation - Urine ACR  12/19/2024   Medicare Annual Wellness (AWV)  03/07/2025   Colonoscopy  01/28/2026   Pneumococcal Vaccine: 50+ Years  Completed   Hepatitis C Screening  Completed   Zoster Vaccines- Shingrix  Completed   HPV VACCINES  Aged Out   Meningococcal B Vaccine  Aged Out    Health Maintenance Health Maintenance Due  Topic Date Due   DEXA SCAN  01/21/2020   FOOT EXAM  08/27/2023   Influenza Vaccine  01/15/2024   COVID-19  Vaccine (5 - 2025-26 season) 02/15/2024   OPHTHALMOLOGY EXAM  02/23/2024   HEMOGLOBIN A1C  03/05/2024   Health Maintenance Items Addressed: DEXA ordered  Additional Screening: Vision Screening: Recommended annual ophthalmology exams for early detection of glaucoma and other disorders of the eye. Would you like a referral to an eye doctor? A list of eye care providers were provided to the patient  Dental Screening: Recommended annual dental exams for proper oral hygiene  Community Resource Referral / Chronic Care Management: CRR required this visit?  No   CCM required this visit?  No  Plan:   I have personally reviewed and noted the following in the patient's chart:   Medical and social history Use of alcohol, tobacco or illicit drugs  Current medications and supplements including opioid prescriptions. Patient is not currently taking opioid prescriptions. Functional ability and status Nutritional status Physical activity Advanced directives List of other physicians Hospitalizations, surgeries, and ER visits in previous 12 months Vitals Screenings to include cognitive, depression, and falls Referrals and appointments  In addition, I have reviewed and discussed with patient certain preventive protocols, quality metrics, and best practice recommendations. A written personalized care plan for preventive services as well as general preventive health recommendations were provided to patient.   Jolleen Seman, CMA   03/07/2024   After Visit Summary: (Mail) Due to this being a telephonic visit, the after visit summary with patients personalized plan was offered to patient via mail   Notes: Nothing significant to report at this time.

## 2024-03-07 NOTE — Patient Instructions (Signed)
 Samantha Vega,  Thank you for taking the time for your Medicare Wellness Visit. I appreciate your continued commitment to your health goals. Please review the care plan we discussed, and feel free to reach out if I can assist you further.  Medicare recommends these wellness visits once per year to help you and your care team stay ahead of potential health issues. These visits are designed to focus on prevention, allowing your provider to concentrate on managing your acute and chronic conditions during your regular appointments.  Please note that Annual Wellness Visits do not include a physical exam. Some assessments may be limited, especially if the visit was conducted virtually. If needed, we may recommend a separate in-person follow-up with your provider.  Ongoing Care Seeing your primary care provider every 3 to 6 months helps us  monitor your health and provide consistent, personalized care.   Referrals If a referral was made during today's visit and you haven't received any updates within two weeks, please contact the referred provider directly to check on the status. Osteoporosis Screening An order was placed for you to have your Osteoporosis Screening. Call the number below to schedule that [facility]  [phone]  Recommended Screenings:  Health Maintenance  Topic Date Due   DEXA scan (bone density measurement)  01/21/2020   Flu Shot  01/15/2024   COVID-19 Vaccine (5 - 2025-26 season) 02/15/2024   Eye exam for diabetics  02/23/2024   Hemoglobin A1C  03/05/2024   DTaP/Tdap/Td vaccine (3 - Td or Tdap) 09/02/2024*   Breast Cancer Screening  12/10/2024   Yearly kidney function blood test for diabetes  12/13/2024   Yearly kidney health urinalysis for diabetes  12/19/2024   Complete foot exam   01/31/2025   Medicare Annual Wellness Visit  03/07/2025   Colon Cancer Screening  01/28/2026   Pneumococcal Vaccine for age over 81  Completed   Hepatitis C Screening  Completed   Zoster  (Shingles) Vaccine  Completed   HPV Vaccine  Aged Out   Meningitis B Vaccine  Aged Out  *Topic was postponed. The date shown is not the original due date.       03/07/2024    8:47 AM  Advanced Directives  Does Patient Have a Medical Advance Directive? No  Would patient like information on creating a medical advance directive? Yes (MAU/Ambulatory/Procedural Areas - Information given)   Advance Care Planning is important because it: Ensures you receive medical care that aligns with your values, goals, and preferences. Provides guidance to your family and loved ones, reducing the emotional burden of decision-making during critical moments.  Vision: Annual vision screenings are recommended for early detection of glaucoma, cataracts, and diabetic retinopathy. These exams can also reveal signs of chronic conditions such as diabetes and high blood pressure.  Dental: Annual dental screenings help detect early signs of oral cancer, gum disease, and other conditions linked to overall health, including heart disease and diabetes.  Please see the attached documents for additional preventive care recommendations.

## 2024-03-08 ENCOUNTER — Ambulatory Visit: Admitting: Family Medicine

## 2024-03-09 ENCOUNTER — Ambulatory Visit: Attending: Cardiology | Admitting: *Deleted

## 2024-03-09 DIAGNOSIS — I480 Paroxysmal atrial fibrillation: Secondary | ICD-10-CM

## 2024-03-09 DIAGNOSIS — Z5181 Encounter for therapeutic drug level monitoring: Secondary | ICD-10-CM | POA: Diagnosis not present

## 2024-03-09 LAB — POCT INR: INR: 1.6 — AB (ref 2.0–3.0)

## 2024-03-09 NOTE — Progress Notes (Signed)
 INR 1.6; Please see anticoagulation encounter

## 2024-03-09 NOTE — Patient Instructions (Signed)
 Take warfarin 1 tablet tonight then increase dose to 1 tablet daily except 1/2 tablet on Wednesdays Continue greens Recheck INR in 3 weeks

## 2024-03-15 ENCOUNTER — Other Ambulatory Visit: Payer: Self-pay | Admitting: Family Medicine

## 2024-03-30 ENCOUNTER — Ambulatory Visit: Attending: Cardiology | Admitting: *Deleted

## 2024-03-30 DIAGNOSIS — Z5181 Encounter for therapeutic drug level monitoring: Secondary | ICD-10-CM

## 2024-03-30 DIAGNOSIS — I480 Paroxysmal atrial fibrillation: Secondary | ICD-10-CM | POA: Diagnosis not present

## 2024-03-30 LAB — POCT INR: INR: 5 — AB (ref 2.0–3.0)

## 2024-03-30 NOTE — Progress Notes (Signed)
 INR 5.0  Please see anticoagulation encounter

## 2024-03-30 NOTE — Patient Instructions (Signed)
 Hold warfarin tonight and tomorrow night, take 1/2 tablet on Friday the resume 1 tablet daily except 1/2 tablet on Wednesdays Continue greens Recheck INR in 1 week

## 2024-04-05 ENCOUNTER — Other Ambulatory Visit: Payer: Self-pay | Admitting: Family Medicine

## 2024-04-05 ENCOUNTER — Other Ambulatory Visit: Payer: Self-pay | Admitting: Cardiology

## 2024-04-06 ENCOUNTER — Ambulatory Visit: Attending: Cardiology | Admitting: *Deleted

## 2024-04-06 DIAGNOSIS — I480 Paroxysmal atrial fibrillation: Secondary | ICD-10-CM

## 2024-04-06 DIAGNOSIS — Z5181 Encounter for therapeutic drug level monitoring: Secondary | ICD-10-CM

## 2024-04-06 LAB — POCT INR: INR: 2.5 (ref 2.0–3.0)

## 2024-04-06 NOTE — Progress Notes (Signed)
 INR 2.5. Please see anticoagulation encounter

## 2024-04-06 NOTE — Patient Instructions (Signed)
 Continue warfarin 1 tablet daily except 1/2 tablet on Wednesdays Continue greens Recheck INR in 3 weeks

## 2024-04-27 ENCOUNTER — Ambulatory Visit: Attending: Cardiology | Admitting: *Deleted

## 2024-04-27 DIAGNOSIS — I480 Paroxysmal atrial fibrillation: Secondary | ICD-10-CM

## 2024-04-27 DIAGNOSIS — Z5181 Encounter for therapeutic drug level monitoring: Secondary | ICD-10-CM | POA: Diagnosis not present

## 2024-04-27 LAB — POCT INR: INR: 3 (ref 2.0–3.0)

## 2024-04-27 NOTE — Progress Notes (Signed)
 INR 3.0; Please see anticoagulation encounter

## 2024-04-27 NOTE — Patient Instructions (Signed)
 Continue warfarin 1 tablet daily except 1/2 tablet on Wednesdays Continue greens Recheck INR in 4 weeks

## 2024-05-25 ENCOUNTER — Ambulatory Visit: Attending: Cardiology | Admitting: *Deleted

## 2024-05-25 DIAGNOSIS — I480 Paroxysmal atrial fibrillation: Secondary | ICD-10-CM | POA: Diagnosis not present

## 2024-05-25 DIAGNOSIS — Z5181 Encounter for therapeutic drug level monitoring: Secondary | ICD-10-CM

## 2024-05-25 LAB — POCT INR: INR: 6.8 — AB (ref 2.0–3.0)

## 2024-05-25 NOTE — Patient Instructions (Signed)
 Hold warfarin x 3 days then resume 1 tablet daily except 1/2 tablet on Wednesdays Eat extra greens Recheck Monday 05/30/24 Pt denies S/S of bleeding or excessive bruising.  Bleeding and fall precautions discussed with pt and she verbalized understanding.

## 2024-05-25 NOTE — Progress Notes (Signed)
 INR 6.8; Please see anticoagulation encounter

## 2024-05-30 ENCOUNTER — Ambulatory Visit

## 2024-05-30 ENCOUNTER — Ambulatory Visit: Admitting: Cardiology

## 2024-05-31 ENCOUNTER — Ambulatory Visit: Admitting: Podiatry

## 2024-06-02 ENCOUNTER — Ambulatory Visit: Attending: Cardiology

## 2024-06-02 DIAGNOSIS — I480 Paroxysmal atrial fibrillation: Secondary | ICD-10-CM

## 2024-06-02 DIAGNOSIS — Z5181 Encounter for therapeutic drug level monitoring: Secondary | ICD-10-CM | POA: Diagnosis not present

## 2024-06-02 LAB — POCT INR: INR: 1.3 — AB (ref 2.0–3.0)

## 2024-06-02 NOTE — Patient Instructions (Signed)
 Restart warfarin 1 tablet daily except 1/2 tablet on Wednesdays Recheck in 2 wks

## 2024-06-02 NOTE — Progress Notes (Signed)
 INR 1.3 Please see anticoagulation encounter

## 2024-06-03 ENCOUNTER — Ambulatory Visit: Attending: Cardiology | Admitting: Cardiology

## 2024-06-03 ENCOUNTER — Encounter: Payer: Self-pay | Admitting: Cardiology

## 2024-06-03 VITALS — BP 104/76 | HR 80 | Ht 63.0 in | Wt 203.0 lb

## 2024-06-03 DIAGNOSIS — I5020 Unspecified systolic (congestive) heart failure: Secondary | ICD-10-CM

## 2024-06-03 DIAGNOSIS — I10 Essential (primary) hypertension: Secondary | ICD-10-CM | POA: Diagnosis not present

## 2024-06-03 DIAGNOSIS — D6869 Other thrombophilia: Secondary | ICD-10-CM

## 2024-06-03 DIAGNOSIS — I4811 Longstanding persistent atrial fibrillation: Secondary | ICD-10-CM

## 2024-06-03 NOTE — Patient Instructions (Signed)
 Medication Instructions:  Your physician recommends that you continue on your current medications as directed. Please refer to the Current Medication list given to you today.  *If you need a refill on your cardiac medications before your next appointment, please call your pharmacy*  Lab Work: None If you have labs (blood work) drawn today and your tests are completely normal, you will receive your results only by: MyChart Message (if you have MyChart) OR A paper copy in the mail If you have any lab test that is abnormal or we need to change your treatment, we will call you to review the results.  Testing/Procedures: None  Follow-Up: At [facility], you and your health needs are our priority.  As part of our continuing mission to provide you with exceptional heart care, our providers are all part of one team.  This team includes your primary Cardiologist (physician) and Advanced Practice Providers or APPs (Physician Assistants and Nurse Practitioners) who all work together to provide you with the care you need, when you need it.  Your next appointment:   6 month(s)  Provider:   You may see Dina Rich, MD or one of the following Advanced Practice Providers on your designated Care Team:   Randall An, PA-C  [place], [place] Jacolyn Reedy, [place]     We recommend signing up for the patient portal called "MyChart".  Sign up information is provided on this After Visit Summary.  MyChart is used to connect with patients for Virtual Visits (Telemedicine).  Patients are able to view lab/test results, encounter notes, upcoming appointments, etc.  Non-urgent messages can be sent to your provider as well.   To learn more about what you can do with MyChart, go to [email or web address].   Other Instructions

## 2024-06-03 NOTE — Progress Notes (Signed)
 "     Clinical Summary [name] is a 73 y.o.female seen today for follow up of the following medical problems       1. Chest pain - history of atypical chest pain - ER visit 03/19/2021 with chest pain - trop neg x 2. EKG without acute ischemic changes - CT PE no acute PE, questionable chronic PE  05/2019 echo LVEF 50-55%, apical hypokinesis 04/2021 nuclear stress: apical to basal inferior/inferolateral fixed defect suggesting prior infarct. LVEF 52% 10/2021 echo: LVEF 45-50%, apex hypokinetic, indet diastolic fxn, mild RV dysfunction   - chronic mild nonspecific chest pains.        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      3. Long standing persistent afib - rate control strategy w/ diltiazem  and metoprolol    - previously on xarelto  however cost was too much, changed to coumadin .       - no palpitations - no bleeding on coumadin .    3. HTN   -has not taken meds yet today     4. 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   EKG 11/2006:  NSR, RBBB, LAFB, markedly delayed R-wave progression, no change; Echocardiogram in 10/2006-suboptimal quality, normal; Event recorder-PVCs, no significant  arrhythmias or symptoms ;   TIA (transient ischemic attack) 06/02/2019   Unspecified visual loss 12/08/2008     Allergies[1]   Current Outpatient Medications  Medication Sig Dispense Refill   cholecalciferol (VITAMIN D3) 25 MCG (1000 UNIT) tablet Take 1,000 Units by mouth daily.     diltiazem  (CARDIZEM  CD) 120 MG 24 hr capsule TAKE 1 CAPSULE BY MOUTH EVERY DAY 90 capsule 2   ENTRESTO  24-26 MG TAKE 1 TABLET BY MOUTH TWICE A DAY 180 tablet 3   furosemide  (LASIX ) 20 MG tablet TAKE 1 TABLET BY MOUTH DAILY AS NEEDED FOR FLUID 90 tablet 1   metFORMIN  (GLUCOPHAGE -XR) 500 MG 24 hr tablet TAKE 1 TABLET BY MOUTH EVERY DAY WITH BREAKFAST 90 tablet 1   metoprolol  (TOPROL -XL) 200 MG 24 hr tablet TAKE 1 TABLET BY MOUTH EVERY DAY 90 tablet 3   montelukast  (SINGULAIR ) 10 MG tablet TAKE 1 TABLET BY MOUTH EVERYDAY AT BEDTIME 90 tablet 1   rosuvastatin  (CRESTOR ) 40 MG tablet TAKE 1 TABLET BY MOUTH EVERY DAY 90 tablet 0   terbinafine  (LAMISIL ) 250 MG tablet Take 1 tablet (250 mg total) by mouth daily. 42 tablet 1   warfarin (COUMADIN ) 5 MG tablet Take 1/2 to 1 tablet daily or as directed by coumadin  clinic 100 tablet 1   No current  facility-administered medications for this visit.     Past Surgical History:  Procedure Laterality Date   ABDOMINAL HYSTERECTOMY     BREAST BIOPSY Left    benign   CATARACT EXTRACTION W/PHACO Right 01/08/2016   Procedure: CATARACT EXTRACTION PHACO AND INTRAOCULAR LENS PLACEMENT (IOC);  Surgeon: Oneil Platts, MD;  Location: [facility];  Service: Ophthalmology;  Laterality: Right;  CDE: 5.78   CATARACT EXTRACTION W/PHACO Left 02/05/2016   Procedure: CATARACT EXTRACTION PHACO AND INTRAOCULAR LENS PLACEMENT LEFT EYE CDE=6.50;  Surgeon: Oneil Platts, MD;  Location: [facility];  Service: Ophthalmology;  Laterality: Left;   COLONOSCOPY  2006   Dr. Claudene: Normal, repeat in 2009.   COLONOSCOPY N/A 10/08/2015   Procedure: COLONOSCOPY;  Surgeon: Margo LITTIE Haddock, MD;  Location: [facility];   Service: Endoscopy;  Laterality: N/A;  230 - moved to 1:30, spoke with pt    COLONOSCOPY WITH PROPOFOL  N/A 01/28/2021   Procedure: COLONOSCOPY WITH PROPOFOL ;  Surgeon: Cindie Carlin POUR, DO;  Location: [facility];  Service: Endoscopy;  Laterality: N/A;  9:45am   POLYPECTOMY  10/08/2015   Procedure: POLYPECTOMY;  Surgeon: Margo LITTIE Haddock, MD;  Location: [facility];  Service: Endoscopy;;  cecal polyp removed via cold forceps with one clip placed/ rectal polyp removed via cold forcep   POLYPECTOMY  01/28/2021   Procedure: POLYPECTOMY;  Surgeon: Cindie Carlin POUR, DO;  Location: [facility];  Service: Endoscopy;;   SHOULDER SURGERY  2006   Left-post-trauma   TOTAL ABDOMINAL HYSTERECTOMY W/ BILATERAL SALPINGOOPHORECTOMY       Allergies[2]    Family History  Problem Relation Age of Onset   Hypertension Mother    Arthritis Mother    Stroke Father    Hypertension Father    Coronary artery disease Father    Diabetes Sister    Sarcoidosis Sister    Diabetes Sister    Heart failure Brother    Congestive Heart Failure Brother    Colon cancer Neg Hx      Social History [name] reports that she has never smoked. She has never used smokeless tobacco. [name] reports no history of alcohol use.     Physical Examination Today's Vitals   06/03/24 1351  BP: 104/76  Pulse: 80  SpO2: 95%  Weight: 203 lb (92.1 kg)  Height: 5' 3 (1.6 m)   Body mass index is 35.96 kg/m.  Gen: resting comfortably, no acute distress HEENT: no scleral icterus, pupils equal round and reactive, no palptable cervical adenopathy,  CV: RRR, no mrg, no jvd Resp: Clear to auscultation bilaterally GI: abdomen is soft, non-tender, non-distended, normal bowel sounds, no hepatosplenomegaly MSK: extremities are warm, no edema.  Skin: warm, no rash Neuro:  no focal deficits Psych: appropriate affect    Assessment and Plan   1. HFimpEF -euvolemic without symptoms, continue current meds   2.  Long standing persistent Afib/acquried thrombophilia - DOACs too expensive, she is on coumadin  for stroke prevention.  - no symptoms, continue current meds     3. HTN -her bp is at goal, continue current meds   4. Hyperlipidemia - lipids at goal, continue current meds   Dorn PHEBE Ross, M.D.     [1] No Known Allergies [2] No Known Allergies  "

## 2024-06-07 ENCOUNTER — Ambulatory Visit: Admitting: Podiatry

## 2024-06-14 ENCOUNTER — Ambulatory Visit: Attending: Cardiology | Admitting: *Deleted

## 2024-06-14 DIAGNOSIS — I480 Paroxysmal atrial fibrillation: Secondary | ICD-10-CM

## 2024-06-14 DIAGNOSIS — Z5181 Encounter for therapeutic drug level monitoring: Secondary | ICD-10-CM

## 2024-06-14 LAB — POCT INR: INR: 1.3 — AB (ref 2.0–3.0)

## 2024-06-14 NOTE — Progress Notes (Signed)
 INR 1.3 Please see anticoagulation encounter

## 2024-06-14 NOTE — Patient Instructions (Signed)
 Take warfarin 1 1/2 tablets tonight then increase dose to 1 tablet daily  Recheck in 2 wks

## 2024-06-23 ENCOUNTER — Ambulatory Visit: Admitting: Family Medicine

## 2024-06-23 ENCOUNTER — Other Ambulatory Visit (HOSPITAL_COMMUNITY): Payer: Self-pay | Admitting: Family Medicine

## 2024-06-23 ENCOUNTER — Encounter: Payer: Self-pay | Admitting: Family Medicine

## 2024-06-23 VITALS — BP 119/61 | HR 82 | Resp 16 | Ht 63.0 in | Wt 203.0 lb

## 2024-06-23 DIAGNOSIS — Z23 Encounter for immunization: Secondary | ICD-10-CM | POA: Insufficient documentation

## 2024-06-23 DIAGNOSIS — I480 Paroxysmal atrial fibrillation: Secondary | ICD-10-CM

## 2024-06-23 DIAGNOSIS — E785 Hyperlipidemia, unspecified: Secondary | ICD-10-CM

## 2024-06-23 DIAGNOSIS — Z1231 Encounter for screening mammogram for malignant neoplasm of breast: Secondary | ICD-10-CM

## 2024-06-23 DIAGNOSIS — E66811 Obesity, class 1: Secondary | ICD-10-CM | POA: Diagnosis not present

## 2024-06-23 DIAGNOSIS — J302 Other seasonal allergic rhinitis: Secondary | ICD-10-CM

## 2024-06-23 DIAGNOSIS — R7989 Other specified abnormal findings of blood chemistry: Secondary | ICD-10-CM | POA: Diagnosis not present

## 2024-06-23 DIAGNOSIS — I10 Essential (primary) hypertension: Secondary | ICD-10-CM | POA: Diagnosis not present

## 2024-06-23 DIAGNOSIS — Z6835 Body mass index (BMI) 35.0-35.9, adult: Secondary | ICD-10-CM

## 2024-06-23 DIAGNOSIS — E1169 Type 2 diabetes mellitus with other specified complication: Secondary | ICD-10-CM

## 2024-06-23 NOTE — Assessment & Plan Note (Signed)
 Chronic and on coumadin , monitored by Cardiology

## 2024-06-23 NOTE — Assessment & Plan Note (Signed)
 After obtaining informed consent, the influenza vaccine is  administered , with no adverse effect noted at the time of administration.

## 2024-06-23 NOTE — Assessment & Plan Note (Addendum)
 Diabetes associated with hypertension, hyperlipidemia, and obesity  Samantha Vega is reminded of the importance of commitment to daily physical activity for 30 minutes or more, as able and the need to limit carbohydrate intake to 30 to 60 grams per meal to help with blood sugar control.   The need to take medication as prescribed, test blood sugar as directed, and to call between visits if there is a concern that blood sugar is uncontrolled is also discussed.   Samantha Vega is reminded of the importance of daily foot exam, annual eye examination, and good blood sugar, blood pressure and cholesterol control.     Latest Ref Rng & Units 12/20/2023   12:00 AM 12/14/2023    6:55 PM 09/03/2023    9:35 AM 02/10/2023    9:46 AM 08/21/2022    9:36 AM  Diabetic Labs  HbA1c 4.8 - 5.6 %   6.6  6.4  6.3   Micro/Creat Ratio  <30        9   Chol 100 - 199 mg/dL   851  847  839   HDL >60 mg/dL   38  35  38   Calc LDL 0 - 99 mg/dL   92  94  896   Triglycerides 0 - 149 mg/dL   94  872  899   Creatinine 0.44 - 1.00 mg/dL  8.96  8.84  8.98  9.09      This result is from an external source.      06/23/2024    3:07 PM 06/03/2024    1:51 PM 03/07/2024    8:49 AM 12/14/2023    5:43 PM 12/14/2023    5:41 PM 11/24/2023   10:34 AM 10/06/2023   10:11 AM  BP/Weight  Systolic BP 119 104 -- 118  132 116  Diastolic BP 61 76 -- 76  80 81  Wt. (Lbs) 203 203 179  179 195.2 191  BMI 35.96 kg/m2 35.96 kg/m2 32.74 kg/m2  32.74 kg/m2 34.58 kg/m2 34.93 kg/m2      02/01/2024   12:00 AM 02/23/2023    9:39 AM  Foot/eye exam completion dates  Eye Exam  --      Foot exam Order low          This result is from an external source.      Updated lab needed at/ before next visit.

## 2024-06-23 NOTE — Patient Instructions (Addendum)
" °  Annual exam end August  Labs today, CBC, lipid, cmp and eGFr, hBA1C , tSH and vit D today  Please schedule  dexa at checkout past due  Pls schedule June 28 or after mammogram at checkout  Nurse pls write number for Dr Darroll on d/c summary and explain, pt needs to call for her appt  It is important that you exercise regularly at least 30 minutes 5 times a week. If you develop chest pain, have severe difficulty breathing, or feel very tired, stop exercising immediately and seek medical attention   Please reduce amount that you eat so that you lose weight, you have gained 12 pounds in past 1 year  Be careful not to fall Thanks for choosing [facility], we consider it a privelige to serve you.  "

## 2024-06-23 NOTE — Assessment & Plan Note (Signed)
 Controlled, no change in medication

## 2024-06-23 NOTE — Progress Notes (Signed)
 "  Samantha Vega     MRN: [id number]      DOB: 1950-08-17  Chief Complaint  Patient presents with   Hypertension    Follow up     HPI Samantha Vega is here for follow up and re-evaluation of chronic medical conditions, medication management and review of any available recent lab and radiology data.  Preventive health is updated, specifically  Cancer screening and Immunization.   Questions or concerns regarding consultations or procedures which the PT has had in the interim are  addressed. The PT denies any adverse reactions to current medications since the last visit.  There are no new concerns.  There are no specific complaints   ROS Denies recent fever or chills. Denies sinus pressure, nasal congestion, ear pain or sore throat. Denies chest congestion, productive cough or wheezing. Denies chest pains, palpitations and leg swelling Denies abdominal pain, nausea, vomiting,diarrhea or constipation.   Denies dysuria, frequency, hesitancy or incontinence. Denies uncontrolled joint pain, swelling and limitation in mobility. Denies headaches, seizures, numbness, or tingling. Denies depression, anxiety or insomnia. Denies skin break down or rash.   PE  BP 119/61   Pulse 82   Resp 16   Ht 5' 3 (1.6 m)   Wt 203 lb (92.1 kg)   SpO2 98%   BMI 35.96 kg/m   Patient alert and oriented and in no cardiopulmonary distress.  HEENT: No facial asymmetry, EOMI,     Neck supple .  Chest: Clear to auscultation bilaterally.  CVS: S1, S2 no murmurs, no S3.Regular rate.  ABD: Soft non tender.   Ext: No edema  MS: Adequate ROM spine, shoulders, hips and knees.  Skin: Intact, no ulcerations or rash noted.  Psych: Good eye contact, normal affect. Memory intact not anxious or depressed appearing.  CNS: CN 2-12 intact, power,  normal throughout.no focal deficits noted.   Assessment & Plan  Essential hypertension Controlled, no change in medication DASH diet and commitment to  daily physical activity for a minimum of 30 minutes discussed and encouraged, as a part of hypertension management. The importance of attaining a healthy weight is also discussed.     06/23/2024    3:07 PM 06/03/2024    1:51 PM 03/07/2024    8:49 AM 12/14/2023    5:43 PM 12/14/2023    5:41 PM 11/24/2023   10:34 AM 10/06/2023   10:11 AM  BP/Weight  Systolic BP 119 104 -- 118  132 116  Diastolic BP 61 76 -- 76  80 81  Wt. (Lbs) 203 203 179  179 195.2 191  BMI 35.96 kg/m2 35.96 kg/m2 32.74 kg/m2  32.74 kg/m2 34.58 kg/m2 34.93 kg/m2       Hyperlipidemia LDL goal <70 Hyperlipidemia:Low fat diet discussed and encouraged.   Lipid Panel  Lab Results  Component Value Date   CHOL 148 09/03/2023   HDL 38 (L) 09/03/2023   LDLCALC 92 09/03/2023   TRIG 94 09/03/2023   CHOLHDL 4.3 02/10/2023     Updated lab needed at/ before next visit.   Type 2 diabetes mellitus with other specified complication (HCC) Diabetes associated with hypertension, hyperlipidemia, and obesity  Samantha Vega is reminded of the importance of commitment to daily physical activity for 30 minutes or more, as able and the need to limit carbohydrate intake to 30 to 60 grams per meal to help with blood sugar control.   The need to take medication as prescribed, test blood sugar as directed, and to call between  visits if there is a concern that blood sugar is uncontrolled is also discussed.   Samantha Vega is reminded of the importance of daily foot exam, annual eye examination, and good blood sugar, blood pressure and cholesterol control.     Latest Ref Rng & Units 12/20/2023   12:00 AM 12/14/2023    6:55 PM 09/03/2023    9:35 AM 02/10/2023    9:46 AM 08/21/2022    9:36 AM  Diabetic Labs  HbA1c 4.8 - 5.6 %   6.6  6.4  6.3   Micro/Creat Ratio  <30        9   Chol 100 - 199 mg/dL   851  847  839   HDL >60 mg/dL   38  35  38   Calc LDL 0 - 99 mg/dL   92  94  896   Triglycerides 0 - 149 mg/dL   94  872  899   Creatinine  0.44 - 1.00 mg/dL  8.96  8.84  8.98  9.09      This result is from an external source.      06/23/2024    3:07 PM 06/03/2024    1:51 PM 03/07/2024    8:49 AM 12/14/2023    5:43 PM 12/14/2023    5:41 PM 11/24/2023   10:34 AM 10/06/2023   10:11 AM  BP/Weight  Systolic BP 119 104 -- 118  132 116  Diastolic BP 61 76 -- 76  80 81  Wt. (Lbs) 203 203 179  179 195.2 191  BMI 35.96 kg/m2 35.96 kg/m2 32.74 kg/m2  32.74 kg/m2 34.58 kg/m2 34.93 kg/m2      02/01/2024   12:00 AM 02/23/2023    9:39 AM  Foot/eye exam completion dates  Eye Exam  --      Foot exam Order low          This result is from an external source.      Updated lab needed at/ before next visit.   Allergic rhinitis Controlled, no change in medication   Morbid obesity (HCC)  Patient re-educated about  the importance of commitment to a  minimum of 150 minutes of exercise per week as able.  The importance of healthy food choices with portion control discussed, as well as eating regularly and within a 12 hour window most days. The need to choose clean , green food 50 to 75% of the time is discussed, as well as to make water  the primary drink and set a goal of 64 ounces water  daily.       06/23/2024    3:07 PM 06/03/2024    1:51 PM 03/07/2024    8:49 AM  Weight /BMI  Weight 203 lb 203 lb 179 lb  Height 5' 3 (1.6 m) 5' 3 (1.6 m) 5' 2 (1.575 m)  BMI 35.96 kg/m2 35.96 kg/m2 32.74 kg/m2    deteriorated  Paroxysmal atrial fibrillation (HCC) Chronic and on coumadin , monitored by Cardiology  Low vitamin D  level Updated lab needed at/ before next visit.   Immunization due After obtaining informed consent, the influenza  vaccine is  administered , with no adverse effect noted at the time of administration.  "

## 2024-06-23 NOTE — Assessment & Plan Note (Signed)
 Controlled, no change in medication DASH diet and commitment to daily physical activity for a minimum of 30 minutes discussed and encouraged, as a part of hypertension management. The importance of attaining a healthy weight is also discussed.     06/23/2024    3:07 PM 06/03/2024    1:51 PM 03/07/2024    8:49 AM 12/14/2023    5:43 PM 12/14/2023    5:41 PM 11/24/2023   10:34 AM 10/06/2023   10:11 AM  BP/Weight  Systolic BP 119 104 -- 118  132 116  Diastolic BP 61 76 -- 76  80 81  Wt. (Lbs) 203 203 179  179 195.2 191  BMI 35.96 kg/m2 35.96 kg/m2 32.74 kg/m2  32.74 kg/m2 34.58 kg/m2 34.93 kg/m2

## 2024-06-23 NOTE — Assessment & Plan Note (Signed)
 Updated lab needed at/ before next visit.

## 2024-06-23 NOTE — Assessment & Plan Note (Signed)
" °  Patient re-educated about  the importance of commitment to a  minimum of 150 minutes of exercise per week as able.  The importance of healthy food choices with portion control discussed, as well as eating regularly and within a 12 hour window most days. The need to choose clean , green food 50 to 75% of the time is discussed, as well as to make water  the primary drink and set a goal of 64 ounces water  daily.       06/23/2024    3:07 PM 06/03/2024    1:51 PM 03/07/2024    8:49 AM  Weight /BMI  Weight 203 lb 203 lb 179 lb  Height 5' 3 (1.6 m) 5' 3 (1.6 m) 5' 2 (1.575 m)  BMI 35.96 kg/m2 35.96 kg/m2 32.74 kg/m2    deteriorated "

## 2024-06-23 NOTE — Assessment & Plan Note (Signed)
 Hyperlipidemia:Low fat diet discussed and encouraged.   Lipid Panel  Lab Results  Component Value Date   CHOL 148 09/03/2023   HDL 38 (L) 09/03/2023   LDLCALC 92 09/03/2023   TRIG 94 09/03/2023   CHOLHDL 4.3 02/10/2023     Updated lab needed at/ before next visit.

## 2024-06-24 ENCOUNTER — Other Ambulatory Visit: Payer: Self-pay

## 2024-06-24 ENCOUNTER — Ambulatory Visit: Payer: Self-pay | Admitting: Family Medicine

## 2024-06-24 ENCOUNTER — Ambulatory Visit: Admitting: Podiatry

## 2024-06-24 DIAGNOSIS — I10 Essential (primary) hypertension: Secondary | ICD-10-CM

## 2024-06-24 DIAGNOSIS — Z794 Long term (current) use of insulin: Secondary | ICD-10-CM

## 2024-06-24 LAB — CBC WITH DIFFERENTIAL/PLATELET
Basophils Absolute: 0 x10E3/uL (ref 0.0–0.2)
Basos: 1 %
EOS (ABSOLUTE): 0.1 x10E3/uL (ref 0.0–0.4)
Eos: 3 %
Hematocrit: 39.7 % (ref 34.0–46.6)
Hemoglobin: 12.5 g/dL (ref 11.1–15.9)
Immature Grans (Abs): 0 x10E3/uL (ref 0.0–0.1)
Immature Granulocytes: 0 %
Lymphocytes Absolute: 1.7 x10E3/uL (ref 0.7–3.1)
Lymphs: 39 %
MCH: 27.5 pg (ref 26.6–33.0)
MCHC: 31.5 g/dL (ref 31.5–35.7)
MCV: 87 fL (ref 79–97)
Monocytes Absolute: 0.4 x10E3/uL (ref 0.1–0.9)
Monocytes: 10 %
Neutrophils Absolute: 2 x10E3/uL (ref 1.4–7.0)
Neutrophils: 47 %
Platelets: 194 x10E3/uL (ref 150–450)
RBC: 4.54 x10E6/uL (ref 3.77–5.28)
RDW: 15.1 % (ref 11.7–15.4)
WBC: 4.2 x10E3/uL (ref 3.4–10.8)

## 2024-06-24 LAB — LIPID PANEL
Chol/HDL Ratio: 3.8 ratio (ref 0.0–4.4)
Cholesterol, Total: 149 mg/dL (ref 100–199)
HDL: 39 mg/dL — ABNORMAL LOW
LDL Chol Calc (NIH): 89 mg/dL (ref 0–99)
Triglycerides: 114 mg/dL (ref 0–149)
VLDL Cholesterol Cal: 21 mg/dL (ref 5–40)

## 2024-06-24 LAB — CMP14+EGFR
ALT: 17 IU/L (ref 0–32)
AST: 21 IU/L (ref 0–40)
Albumin: 3.9 g/dL (ref 3.8–4.8)
Alkaline Phosphatase: 112 IU/L (ref 49–135)
BUN/Creatinine Ratio: 13 (ref 12–28)
BUN: 12 mg/dL (ref 8–27)
Bilirubin Total: 0.2 mg/dL (ref 0.0–1.2)
CO2: 25 mmol/L (ref 20–29)
Calcium: 9.4 mg/dL (ref 8.7–10.3)
Chloride: 104 mmol/L (ref 96–106)
Creatinine, Ser: 0.94 mg/dL (ref 0.57–1.00)
Globulin, Total: 3 g/dL (ref 1.5–4.5)
Glucose: 86 mg/dL (ref 70–99)
Potassium: 4.3 mmol/L (ref 3.5–5.2)
Sodium: 140 mmol/L (ref 134–144)
Total Protein: 6.9 g/dL (ref 6.0–8.5)
eGFR: 63 mL/min/1.73

## 2024-06-24 LAB — HEMOGLOBIN A1C
Est. average glucose Bld gHb Est-mCnc: 131 mg/dL
Hgb A1c MFr Bld: 6.2 % — ABNORMAL HIGH (ref 4.8–5.6)

## 2024-06-24 LAB — TSH: TSH: 1.38 u[IU]/mL (ref 0.450–4.500)

## 2024-06-24 LAB — VITAMIN D 25 HYDROXY (VIT D DEFICIENCY, FRACTURES): Vit D, 25-Hydroxy: 31 ng/mL (ref 30.0–100.0)

## 2024-06-28 ENCOUNTER — Ambulatory Visit: Attending: Cardiology | Admitting: *Deleted

## 2024-06-28 ENCOUNTER — Ambulatory Visit: Admitting: Podiatry

## 2024-06-28 DIAGNOSIS — I480 Paroxysmal atrial fibrillation: Secondary | ICD-10-CM

## 2024-06-28 DIAGNOSIS — Z5181 Encounter for therapeutic drug level monitoring: Secondary | ICD-10-CM

## 2024-06-28 LAB — POCT INR: INR: 5 — AB (ref 2.0–3.0)

## 2024-06-28 NOTE — Patient Instructions (Addendum)
 Hold warfarin tonight and tomorrow night then resume 1 tablet daily  Eat extra greens today. Recheck in 2 wks

## 2024-06-28 NOTE — Progress Notes (Signed)
 INR 5.0  Please see anticoagulation encounter

## 2024-06-29 ENCOUNTER — Ambulatory Visit: Admitting: Podiatry

## 2024-06-30 ENCOUNTER — Ambulatory Visit (HOSPITAL_COMMUNITY)
Admission: RE | Admit: 2024-06-30 | Discharge: 2024-06-30 | Disposition: A | Discharge status: Home / Self Care | Source: Ambulatory Visit | Attending: Internal Medicine | Admitting: Internal Medicine

## 2024-06-30 ENCOUNTER — Ambulatory Visit: Payer: Self-pay | Admitting: Internal Medicine

## 2024-06-30 DIAGNOSIS — Z780 Asymptomatic menopausal state: Secondary | ICD-10-CM | POA: Diagnosis present

## 2024-07-14 ENCOUNTER — Ambulatory Visit

## 2024-07-17 ENCOUNTER — Other Ambulatory Visit: Payer: Self-pay | Admitting: Cardiology

## 2024-07-19 ENCOUNTER — Ambulatory Visit: Admitting: *Deleted

## 2024-07-19 DIAGNOSIS — Z5181 Encounter for therapeutic drug level monitoring: Secondary | ICD-10-CM

## 2024-07-19 DIAGNOSIS — I480 Paroxysmal atrial fibrillation: Secondary | ICD-10-CM | POA: Diagnosis not present

## 2024-07-19 LAB — POCT INR: INR: 4.5 — AB (ref 2.0–3.0)

## 2024-07-19 NOTE — Progress Notes (Signed)
 INR 4.5

## 2024-07-20 NOTE — Telephone Encounter (Signed)
 Lipids Completed on 06/23/24

## 2024-08-02 ENCOUNTER — Ambulatory Visit

## 2024-08-05 ENCOUNTER — Ambulatory Visit: Admitting: Podiatry

## 2024-12-12 ENCOUNTER — Ambulatory Visit (HOSPITAL_COMMUNITY)

## 2025-02-09 ENCOUNTER — Encounter: Payer: Self-pay | Admitting: Family Medicine

## 2025-03-08 ENCOUNTER — Ambulatory Visit
